# Patient Record
Sex: Male | Born: 1940
Health system: Southern US, Community
[De-identification: ages and names within clinical notes are randomized; demographics above are authoritative.]

## PROBLEM LIST (undated history)

## (undated) DIAGNOSIS — I2699 Other pulmonary embolism without acute cor pulmonale: Secondary | ICD-10-CM

## (undated) DIAGNOSIS — N289 Disorder of kidney and ureter, unspecified: Secondary | ICD-10-CM

## (undated) DIAGNOSIS — K219 Gastro-esophageal reflux disease without esophagitis: Secondary | ICD-10-CM

## (undated) DIAGNOSIS — I10 Essential (primary) hypertension: Secondary | ICD-10-CM

## (undated) DIAGNOSIS — Z8719 Personal history of other diseases of the digestive system: Secondary | ICD-10-CM

## (undated) DIAGNOSIS — I2510 Atherosclerotic heart disease of native coronary artery without angina pectoris: Secondary | ICD-10-CM

## (undated) DIAGNOSIS — G4730 Sleep apnea, unspecified: Secondary | ICD-10-CM

## (undated) DIAGNOSIS — M109 Gout, unspecified: Secondary | ICD-10-CM

## (undated) HISTORY — PX: BACK SURGERY: SHX140

---

## 1998-04-17 ENCOUNTER — Ambulatory Visit: Admission: RE | Admit: 1998-04-17 | Discharge: 1998-04-17 | Payer: Self-pay | Admitting: Otolaryngology

## 2001-04-13 ENCOUNTER — Ambulatory Visit (HOSPITAL_BASED_OUTPATIENT_CLINIC_OR_DEPARTMENT_OTHER): Admission: RE | Admit: 2001-04-13 | Discharge: 2001-04-13 | Payer: Self-pay | Admitting: Internal Medicine

## 2002-07-14 HISTORY — PX: OTHER SURGICAL HISTORY: SHX169

## 2003-07-15 HISTORY — PX: OTHER SURGICAL HISTORY: SHX169

## 2003-10-17 ENCOUNTER — Emergency Department (HOSPITAL_COMMUNITY): Admission: EM | Admit: 2003-10-17 | Discharge: 2003-10-18 | Payer: Self-pay | Admitting: Emergency Medicine

## 2003-10-30 ENCOUNTER — Ambulatory Visit (HOSPITAL_COMMUNITY): Admission: RE | Admit: 2003-10-30 | Discharge: 2003-10-30 | Payer: Self-pay | Admitting: Internal Medicine

## 2003-10-30 ENCOUNTER — Inpatient Hospital Stay (HOSPITAL_COMMUNITY): Admission: EM | Admit: 2003-10-30 | Discharge: 2003-11-03 | Payer: Self-pay | Admitting: Emergency Medicine

## 2004-05-05 ENCOUNTER — Inpatient Hospital Stay (HOSPITAL_COMMUNITY): Admission: AD | Admit: 2004-05-05 | Discharge: 2004-05-07 | Payer: Self-pay | Admitting: Internal Medicine

## 2005-07-14 HISTORY — PX: JOINT REPLACEMENT: SHX530

## 2005-08-22 ENCOUNTER — Encounter: Admission: RE | Admit: 2005-08-22 | Discharge: 2005-08-22 | Payer: Self-pay | Admitting: Internal Medicine

## 2005-12-22 ENCOUNTER — Inpatient Hospital Stay (HOSPITAL_COMMUNITY): Admission: RE | Admit: 2005-12-22 | Discharge: 2005-12-26 | Payer: Self-pay | Admitting: Orthopedic Surgery

## 2006-11-28 ENCOUNTER — Emergency Department (HOSPITAL_COMMUNITY): Admission: EM | Admit: 2006-11-28 | Discharge: 2006-11-28 | Payer: Self-pay | Admitting: Emergency Medicine

## 2007-07-15 HISTORY — PX: OTHER SURGICAL HISTORY: SHX169

## 2007-12-29 ENCOUNTER — Inpatient Hospital Stay (HOSPITAL_COMMUNITY): Admission: RE | Admit: 2007-12-29 | Discharge: 2008-01-02 | Payer: Self-pay | Admitting: Orthopedic Surgery

## 2008-01-04 ENCOUNTER — Inpatient Hospital Stay (HOSPITAL_COMMUNITY): Admission: EM | Admit: 2008-01-04 | Discharge: 2008-01-07 | Payer: Self-pay | Admitting: Emergency Medicine

## 2009-09-25 ENCOUNTER — Encounter: Admission: RE | Admit: 2009-09-25 | Discharge: 2009-09-25 | Payer: Self-pay | Admitting: Internal Medicine

## 2010-11-26 NOTE — H&P (Signed)
NAME:  Ralph Blackwell, Ralph Blackwell NO.:  [id number]   MEDICAL RECORD NO.:  [id number]          PATIENT TYPE:  INP   LOCATION:  3737                         FACILITY:  [facility]   PHYSICIAN:  Ralph Blackwell, MDDATE OF BIRTH:  December 15, 1940   DATE OF ADMISSION:  01/04/2008  DATE OF DISCHARGE:                              HISTORY & PHYSICAL   CHIEF COMPLAINT:  Unable to void.   HISTORY OF PRESENT ILLNESS:  The patient is a 70 year old gentleman with  history of hypertension, diabetes and PE.  Had left knee replacement.  Was sent home on Coumadin and Percocet and then noticed that he had  decreased urine output and had actually voided for the past 24-hours or  so.  When he came into the ER, he had a very distended bladder after  replacement of catheter had 1000 cc of urine.  Of note, his creatinine  was noted to be elevated to 10.  The patient was symptomatic other than  abdominal distention and pain.  No nausea or vomiting.  He also does not  endorse any chest pain or shortness of breath.  No lower extremity  edema.  His knee pain is under control.  He was constipated, but this  also has resolved.  He is overall feeling very well and appears to be  nontoxic.   REVIEW OF SYSTEMS:  As per above.   PAST MEDICAL HISTORY:  1. Significant for DVT.  Was on chronic Coumadin and was restarted on      Coumadin postoperatively.  2. Status post left knee replacement on December 29, 2007.  3. History of hypertension.  4. History of hyperlipidemia.  5. BPH.   ALLERGIES:  No known drug allergies.   MEDICATIONS:  1. Coumadin 5 mg p.o. daily.  2. Crestor 40 mg p.o. daily.  3. Omeprazole 40 mg p.o. daily.  4. Percocet as needed for pain.  5. Ramipril 10 mg p.o. daily.   SOCIAL HISTORY:  The patient does not smoke, use drugs or drink.  Lives  with his wife.   FAMILY HISTORY:  Noncontributory.   PHYSICAL EXAMINATION:  VITAL SIGNS:  Temperature 97.0, blood pressure  145/71, pulse 76,  respirations 20, saturating 97% on room air.  GENERAL:  The patient appears to be in no acute distress.  HEENT:  Head nontraumatic.  Mucous membranes moist.  LUNGS:  Clear to auscultation bilaterally.  HEART:  Regular rate and rhythm.  No murmurs, rubs or gallops.  Appears  to be somewhat hyperdynamic, though.  ABDOMEN:  Some suprapubic tenderness noted, but no distention.  NEUROLOGICAL:  Intact.  EXTREMITIES:  Lower extremities without edema.  There is a clean and dry  dressing on left knee.   LABORATORY DATA:  Hemoglobin 8.5 down from 9.3 at the time of discharge.  Sodium 124, potassium 4.7, creatinine 10.6, BUN 94.  UA shows no  evidence of infection.   ASSESSMENT/PLAN:  This is a 70 year old gentleman who had acute renal  failure likely secondary to urinary obstruction secondary to BPH.  1. Acute renal failure with Foley, start Flomax.  The patient will  likely need to follow up with Urology.  Would recommend Urology      consult in the a.m. so that the patient could get acquainted with      Urologist while here versus referring him as an outpatient.  Will      check FENA and renal ultrasound.  2. Hyponatremia.  Will check orthostatics to determine fluid status      and check urine osmolality.  Will try rehydration with IV fluids      and watch sodium.  3. History of hypertension.  Hold Ramipril given renal failure.  4. Hyperlipidemia.  Continue Crestor.  5. Prophylaxis.  The patient is on Coumadin and Protonix.  6. History of DVT/PE.  Will continue Coumadin.  Currently therapeutic.   Dr. Johnella Blackwell will assume care in the morning.      Ralph Cowboy, MD  Electronically Signed     AVD/MEDQ  D:  01/04/2008  T:  01/04/2008  Job:  [id number]   cc:   Ralph Blackwell, M.D.

## 2010-11-26 NOTE — Op Note (Signed)
NAME:  Ralph Blackwell, Ralph Blackwell NO.:  [id number]   MEDICAL RECORD NO.:  [id number]          PATIENT TYPE:  INP   LOCATION:  5022                         FACILITY:  [facility]   PHYSICIAN:  Harvie Junior, M.D.   DATE OF BIRTH:  1940-10-19   DATE OF PROCEDURE:  DATE OF DISCHARGE:                               OPERATIVE REPORT   PREOPERATIVE DIAGNOSIS:  End-stage degenerative joint disease, left  knee.   POSTOPERATIVE DIAGNOSIS:  End-stage degenerative joint disease, left  knee.   PROCEDURE:  Left total knee replacement with a sigma system, size 4  femur, size 4 tibia, 10-mm bridging bearing a 38-mm all-poly patella.   SURGEON:  Harvie Junior, M.D.   ASSISTANT:  Marshia Ly, P.A.   ANESTHESIA:  General.   BRIEF HISTORY:  Mr, Ralph Blackwell is a 70 year old male with a long history of  having had significant bilateral knee pain.  He had ultimately undergone  a right total knee replacement, and had done well.  With this and  ultimately was complaining of left knee pain and x-rays showed end-stage  degenerative joint disease.  He was brought to the operating room for  left total knee replacement.   PROCEDURE:  The patient brought to the operating room.  After adequate  anesthesia was obtained with general anesthetic, patient placed supine  on the operating table.  Left leg was prepped and draped in usual  sterile fashion.  Following this, the leg was exsanguinated.  Blood  pressure tourniquet was inflated to 350 mmHg.  Following this, midline  incision was made and a subcutaneous tissue was taken down at the level  of extensor mechanism and medial parapatellar arthrotomy was undertaken.  Attention was then turned to the knee where anterior and posterior  cruciates were excised, as well as medial and lateral meniscus.  At this  time, attention was turned towards computer alignment system and the  pins were placed in the femur, two pins in the tibia, two pins in the  femur, and  once this was completed, the arrays were placed and then the  registration process undertaken.  This adds about 20 minutes to surgical  procedure.  Once this was completed and because of his young age and  need for longevity implant, the knee was put through a range of motion.  We could get good neutral long alignment and at this point the tibia was  cut perpendicular to the long axis under computer guidance.  Once this  was completed, the femur was cut perpendicular to the  anatomic axis  under computer guidance.  Once this was completed, our attention was  turned towards the placement of the spacer blocks and that we get out  into perfect neutral long alignment perfect gap balance.  At this point,  attention was turned towards sizing the femur, was sized to a size 4,  and the anterior and posterior cuts were made, we did use a block that  allowed [place] to come down 2-mm, this was accomplished, which gave good gap  balance.  Once this was completed, the attention was then  turned towards  the chamfer cuts, which were made and following this the box cut was  made.  Attention was turned to the tibia, which was sized to a 4, pegs  were placed, and then the central portion was drilled and then a keel  was placed.  Once this was completed, the trial reduction was undertaken  with a 10 spacer, gave [place] perfect neutral long alignment with perfect  gap balance and extension and flexion was then a millimeter gap balance.  Once this completed, attention was turned to the patella where the  patella was cut down to a level from 26-mm down to 15-mm and the legs  were drilled for the patellar button.  Trial patellar button was put in  place.  Range of motion was undertaken.  Excellent easy full extension  and flexion.  At this point all trial components were removed.  The knee  was copiously and thoroughly lavaged and suctioned dry.  The final  components were then cemented in place size 4 femur, size 4  tibia, 10-mm  bridging bearing trial was placed and the 38-mm patella was cemented in  place.  When all cement was allowed to harden, the tourniquet was let  down.  Bleeding was controlled with electrocautery and once this was  completed, the attention was then turned towards the placement of the  final poly.  Once the final poly was placed, then the medium Hemovac  drain.  Medial parapatellar arthrotomy was then closed with 1-0 Vicryl  running, skin with 0 and 2-0 Vicryl, and then the skin with 3-0 Monocryl  subcuticular stitch.  Benzoin and Steri-Strips were applied.  Sterile  compressive dressing was applied.  The patient taken to recovery, is  noted to be in satisfactory condition.  Estimated blood loss for this  procedure was less than 150 mL.      Harvie Junior, M.D.  Electronically Signed     JLG/MEDQ  D:  12/29/2007  T:  12/30/2007  Job:  [id number]

## 2010-11-29 NOTE — Discharge Summary (Signed)
NAME:  VANDEN, FAWAZ NO.:  [id number]   MEDICAL RECORD NO.:  [id number]          PATIENT TYPE:  INP   LOCATION:  3737                         FACILITY:  [facility]   PHYSICIAN:  Candyce Churn, M.D.DATE OF BIRTH:  1940/07/28   DATE OF ADMISSION:  01/03/2008  DATE OF DISCHARGE:  01/07/2008                               DISCHARGE SUMMARY   DISCHARGE DIAGNOSES:  1. Acute renal failure secondary to urinary obstruction, resolved.  2. Postoperative anemia secondary to thigh hematoma, improved status      post transfusion.  3. History of deep vein thrombosis.  4. Status post left total knee replacement on December 29, 2007.  5. History of hypertension.  6. History of hyperlipidemia.  7. Benign prostatic hypertrophy.   DISCHARGE MEDICATIONS:  1. Crestor 20 mg daily.  2. Coumadin 5 mg daily.  3. Omeprazole 40 mg daily.  4. Ramipril 10 mg daily, but hold for now until further instructed as      an outpatient.  5. Avodart 0.5 mg orally daily.  6. Colace 100 mg p.o. b.i.d., but hold for diarrhea.  7. B complex tablet 1 daily.  8. Flomax 0.4 mg p.o. b.i.d.  9. Iron sulfate 325 mg daily.  10.Percocet 5/325 one to two orally q.6-8 h. p.r.n. pain if Tylenol      650 mg p.o. q.6 h. is not effective for knee pain secondary to      recent left total knee replacement.   DISCHARGE LABORATORIES:  From January 07, 2008, revealed a hemoglobin of  10.0, up from 7.9 on January 06, 2008.  White cell count 7800, and platelet  count 203,000.  Occult blood in stool was negative on January 06, 2008.  Pro time on January 07, 2008, was 21.5 seconds, INR was 1.8 which had been  2.0, 2.5, 3.0 on the preceding days.  On admission, sodium was 124, but  discharge sodium was 142 with potassium 4.5, chloride 110, bicarb 28,  and glucose 109.  BUN on admission was 94 with a creatinine of 6.6.  This rapidly corrected and on the day of discharge on January 07, 2008, it  was 1.14.  Liver functions performed on  January 05, 2008, revealed a total  protein of 5.62, albumin 2.5, AST 38, ALT 21, alkaline phosphatase 58,  and total bili 0.9.  TSH on January 05, 2008, was 0.868 and iron studies on  January 05, 2008, were low with an iron of 15, total iron-binding capacity  in the mid range at 238, and percent saturation of 6%.  B12 was also low  at 151, but methylmalonic acid levels were normal at 127 with normal  being 87-318.  Folate was normal at 10.7, ferritin normal at 248.  A  urinalysis on January 03, 2008, was within normal limits except for trace  of leukocyte esterase and 0-2 white cells and rare bacteria and mucus  was present.   HOSPITAL COURSE:  Mr. Shavar Gorka is a very pleasant 70 year old  gentleman who had been discharged home several days prior after having  had a left total knee replacement.  On going home, he had not been able  to void and had probably not voided significantly for more than 48  hours.  He experienced abdominal pain and distention and reported to the  [facility] Emergency Room on January 04, 2008, and Foley was placed and a  1000 mL of urine was evacuated.  He also was complaining of swelling of  the left thigh and an ecchymosis.   The patient was found to have a creatinine in the 9-10 range and a BUN  of almost 100 and was rapidly corrected with placement of Foley.  Creatinine was normal within 48 hours.   While hospitalized, his hemoglobin was found to be low at 7.9 felt to be  secondary to the left thigh hematoma being postoperative, and he was  transfused 2 units of packed red cells with hemoglobin at discharge much  improved as above.  It was 10.4.   An attempt to remove the Foley on the day of discharge was made, but the  patient could not void for approximately 6 hours and the Foley was  replaced and he was discharged home with Foley in place to follow with  Urology as an outpatient.   CONDITION ON DISCHARGE:  Much improved and with urinary function  returning to  normal.   Urinary retention was felt to be secondary to BPH and possibly  contributed by postoperative medications for his knee replacement.      Candyce Churn, M.D.  Electronically Signed     RNG/MEDQ  D:  02/13/2008  T:  02/13/2008  Job:  [id number]   cc:   Harvie Junior, M.D.

## 2010-11-29 NOTE — Op Note (Signed)
NAME:  Ralph Blackwell, Ralph Blackwell               ACCOUNT NO.:  [id number]   MEDICAL RECORD NO.:  [id number]          PATIENT TYPE:  INP   LOCATION:  2550                         FACILITY:  [facility]   PHYSICIAN:  Harvie Junior, M.D.   DATE OF BIRTH:  [birth year]   DATE OF PROCEDURE:  12/22/2005  DATE OF DISCHARGE:                                 OPERATIVE REPORT   PREOPERATIVE DIAGNOSIS:  End-stage degenerative joint disease, right knee.   POSTOPERATIVE DIAGNOSIS:  End-stage degenerative joint disease, right knee.   PRINCIPLE PROCEDURE:  1.  Right total knee replacement with [employer] Sigma system, a size      3 femur, a size 4 tibia, a 12.5 mm bridging bearing, and a 35 mm all      polyethylene patella, all cemented.  2.  Computer assisted right total knee replacement.   SURGEON:  Harvie Junior, M.D.   ASSISTANT:  Marshia Ly, P.A., present for the entire case.   BRIEF HISTORY:  Ralph Blackwell is a 70 year old male with a long history of  having had significant pain in the right knee.  Was treated conservatively  with cortisone injection and anti-inflammatory medications, a series of  Supartz.  All this failed because of continuing complaints of pain.  Knee  arthroscopy had been performed and showed that he had a large area of  exposed bone with a kissing lesion on the tibia.  We talked about treatment  options and tried to nurse him through but because of continued complaints  of pain, ultimately felt that total knee replacement was appropriate, and he  was brought to the operating room for this procedure.   PROCEDURE:  Patient was brought to the operating room.  After adequate  general anesthesia was obtained with general anesthetic, the patient was  positioned on the operating room table.  The right leg was then prepped and  draped in the usual sterile fashion.  Following this, the leg was  exsanguinated, and blood pressure tourniquet was inflated to 350 mmHg.  Following this, a  midline incision was made.  The subcutaneous tissue was  dissected down to the level of the extensor mechanism, and a medial  parapatellar arthrotomy was undertaken.  At that point, the anterior and  posterior cruciates were excised as well as the medial and lateral meniscus,  and the leg was exposed.  At this point, computer assistance was used to cut  the tibia perpendicular to its long axis.  We ultimately elected to go 4 mm  below the low deficit, and prior to cutting, the computer modules were  applied.  Two pins were applied to the tibia, two pins applied to the femur,  and the corresponding rays were then placed, and then registration was  undertaken, a 15 mm registration process to allow for accurate cutting with  the computer.  Once that was done, the tibia was cut perpendicular to its  long axis per the computer plan of 4 mm lower than the low side.   Attention was then turned to the femoral side.  The distal femur was cut  perpendicular to its long axis.  I used a spacer block to check with the  gaps.  A 12. 5 mm bearing seemed best at this point.  Attention was then  turned towards sizing the femur and preoperative planning.  The femur was  between a 3 and a 4.  To get the gaps balanced perfectly, a size 3 femur  seemed to work better, so that was chosen.  At this point, the cuts were  made, anterior and posterior chamfers, and then the blocks again were used.  Perfect balancing was achieved at this point, as per the computer plan.  The  box was then cut, and the trial femur was put in place.  The tibia was then  sized to a 4.  The trial tibia was cut.  The keel was punched at this point,  and then a trial with a 12.5 was put in place.  The computer then showed 5  degrees of hyperextension but perfect gap balancing at 0 degrees as well as  perfect gap balancing at 90 degrees.  The patella was perfectly midline  tracked at this point, and then the patella was cut down to a level  of 14  mm, and the 35 patella was chosen and put in place.  Once this was  accomplished, the computer was then used to make sure that the alignment was  in fact perfect.  It was, and the stability felt great with a 12.5 mm  insert.  At this time, the trials were removed, and the final components  were cemented into place after the knee was copiously irrigated and  suctioned dry with the pulsatile lavage irrigation system.  Once that was  accomplished, a 12.5 bearing was put in place, and again, the gaps were  checked.  The gaps were perfect.  Did kind of come into 6 degrees of  hyperextension, according to the computer.  It looked relatively straight to  [place] on the table.  After the cement hardened, we did trial a 15 insert just  because of the little bit of sag that he had.  The 15 insert would not allow  the implant to come all the way to full extension.  At that point, we felt  that the 12.5 was going to be a better polyethylene insert, and we went with  that original plan.  A 12.5 was then put in place.  The knee was put through  a range of motion, excellent stability.  Again was irrigated, and the  tourniquet was let down.  Bleeding was controlled with electrocautery.  Then  the medial parapatellar arthrotomy was closed with a #1 running suture.  This came up from below as well.  This was tied.  Excellent range of motion  was then achieved.  The knee was then closed with 0 and 2-0 Vicryl, and a 3-  0 Maxon pull-out suture.  Benzoin and Steri-Strips were applied.  A sterile  compressive dressing was applied, and the patient was taken to the recovery  room and noted to be in satisfactory condition.  Estimated blood loss for  the procedure was less than 100 cc.   The patient did have an issue of previous history of pulmonary embolus, and  he had a deep venous thrombosis after his knee arthroscopy.  Because of that, the patient was on Coumadin, and our plan was to keep the patient on   Coumadin and come off three days ahead of time and do the  surgery regardless  of the pro time.  This plan was followed.  We put PAS stockings on the  patient on the nonoperative leg during the surgery and postoperatively, we  put a thigh-high TED stocking and a PAS stockings on the patient.  He will  be restarted on Coumadin the night of surgery and will also start him on  Lovenox approximately eight hours after the surgery to allow for him to have  the best possible change at a prophylaxis of DVT.      Harvie Junior, M.D.  Electronically Signed     JLG/MEDQ  D:  12/22/2005  T:  12/22/2005  Job:  [id number]   cc:   [facility]

## 2010-11-29 NOTE — H&P (Signed)
NAME:  BRAYDEN, BRODHEAD NO.:  [id number]   MEDICAL RECORD NO.:  [id number]                   PATIENT TYPE:  OUT   LOCATION:  CATS                                 FACILITY:  [facility]   PHYSICIAN:  Candyce Churn, M.D.          DATE OF BIRTH:  Jan 14, 1941   DATE OF ADMISSION:  10/30/2003  DATE OF DISCHARGE:                                HISTORY & PHYSICAL   CHIEF COMPLAINT:  Left chest pain.   Mr. Coate is a pleasant 70 year old male with a recent history of left wrist  surgery secondary to a fall and fracture of his left ulna and radius.  Surgery was approximately two weeks ago.  Over the past four or five days,  he has developed left posterior pleuritic chest pain which was quite severe  when he would lie flat.  Chest x-ray today in my office revealed a left  effusion and left atelectasis.  Subsequent CT scan with rule out PE protocol  revealed multiple bilateral pulmonary emboli.  He is admitted now for IV  heparin therapy.  He has had dyspnea on exertion.   PAST MEDICAL HISTORY:  1. Gastroesophageal reflux disease.  2. Erectile dysfunction.  3. Allergic rhinitis.  4. Recent increased stress at work.  5. Status post left ulna and radial fracture pinning approximately two weeks     ago.   MEDICATIONS:  1. Prilosec 20 mg daily on a p.r.n. basis for his symptoms of reflux.  2. Flonase 1 spray in each nostril h.s.   FAMILY HISTORY:  Significant for CABG in his father at age 38.  Father has  mild diabetes.  Father also has GERD.  Mother died in 09-Dec-1999 after succumbing  to stomach cancer.   SOCIAL HISTORY:  The patient does not smoke or drink.  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  He has three children, ages 57-  41.   REVIEW OF SYSTEMS:  The patient denies any exertional chest pain, but he  does have exertional dyspnea and no family history of clotting disorder that  he is aware of.  He has never had a prior DVT.   He does have ecchymosis over  his right calf and left forearm from his fall that he suffered two weeks  ago.  He has nocturia x1.  Again mild erectile dysfunction.   He does have chronic mild tinnitus and he has mild decreased hearing in the  high frequency range.   PHYSICAL EXAMINATION:  GENERAL:  Well-developed, well-nourished male in no  acute distress.  VITAL SIGNS:  Height is 5 feet 8 inches, weight 200 pounds, temperature  97.1, respiratory rate is 18 and nonlabored.  He does have pain in his  posterior left chest with inspiration.  HEENT:  Atraumatic, normocephalic.  Wears glasses.  Oropharynx clear and  moist.  NECK:  Supple without JVD or thyromegaly.  CHEST:  Clear  to auscultation except in his left base where he has decreased  breath sounds.  Few scattered rales in that area.  He does have a fairly  intense left pleuritic chest pain on inspiration posteriorly.  CARDIAC:  Regular rhythm without murmur or gallop.  ABDOMEN:  Soft, nontender.  Bowel sounds are normal.  RECTAL:  Not performed.  GU:  Circumcised male.  Normal exam.  EXTREMITIES:  Ecchymosis over his left calf which has been there since his  fall two weeks ago.  He had ecchymosis over his medial and distal arm and  proximal left forearm.  Otherwise no peripheral edema.  Negative Homan's  bilaterally in lower extremities.  NEUROLOGICAL:  Oriented x3.  Nonfocal exam.  SKIN:  Without rashes except the two ecchymoses.   Chest x-ray for an attempt on medical clearance revealed left pleural  effusion and left lower lobe atelectasis.   CT scan in a cone of his chest with contrast reveals multiple bilateral  pulmonary emboli.   ASSESSMENT:  This is a 70 year old male with multiple bilateral pulmonary  emboli.  No previous history of DVT or family history of hypercoagulable  state.  He did have a previous surgery two weeks ago, but has been  relatively active except for 24 hours around the time of the surgery.   Denies any symptoms that suggest lower extremity DVT.   PLAN:  1. Start IV heparin and p.o. Coumadin.  2. Monitor.  3. Check a factor V Leiden.  Consider formal coag workup.  4. For chronic stress with his job and also domestic concerns with having to     take care of his father, we will start Celexa 20 mg daily and will use IV     Toradol q. 6 hours for pain.  We will hydrate with normal saline     overnight at 125 an hour and check B-Met in the A.M.                                                Candyce Churn, M.D.    RNG/MEDQ  D:  10/30/2003  T:  10/31/2003  Job:  [id number]

## 2010-11-29 NOTE — H&P (Signed)
NAME:  Ralph Blackwell, Ralph Blackwell NO.:  [id number]   MEDICAL RECORD NO.:  [id number]          PATIENT TYPE:  INP   LOCATION:  3734                         FACILITY:  [facility]   PHYSICIAN:  Lorain Childes, M.D. LHCDATE OF BIRTH:  August 01, 1940   DATE OF ADMISSION:  05/05/2004  DATE OF DISCHARGE:                                HISTORY & PHYSICAL   PHYSICIANS:  Primary care physician:  Dr. Kevan Blackwell.  Referring physician:  Dr. Sherril Blackwell, [facility].   CHIEF COMPLAINT:  Chest pain.   HISTORY OF PRESENT ILLNESS:  The patient is a 70 year old gentleman with no  known coronary disease but risk factors of male sex, age, and  hyperlipidemia.  Admitted to [facility] with chest pain this morning.  The  patient was at church and developed substernal chest pain radiating to his  left arm that was rated at 6/10 and associated with shortness of breath  which was transient, also diaphoresis.  No nausea, vomiting, or syncope.  The symptoms began around 11 a.m.  He took a friend's sublingual  nitroglycerin which resolved his pain completely after 30 minutes.  His wife  then took him to the emergency room for further evaluation.  At [facility], troponin was negative, but CK-MB was elevated.  He was transferred  here for cardiac catheterization.  The patient denies any current chest  pain.  He denies any lower extremity edema, no PND or orthopnea, no current  shortness of breath, no palpitations, no syncope.   PAST MEDICAL HISTORY:  1.  Two back surgeries, last in 1999.  2.  Status post fall resulting in a left wrist fracture and leg trauma.  3.  Status post left ulna and radial fracture pinning in March 2005.  4.  He was admitted in April 2005 with pulmonary embolus in the setting of      his recent leg trauma.  He has been on Coumadin since then.  5.  Gastroesophageal reflux disease.  6.  History of erectile dysfunction.  7.  History of allergic rhinitis.  8.  Borderline  hyperlipidemia followed by his primary care physician.   MEDICATIONS:  1.  Coumadin 7.5 mg Monday, Wednesday, and Friday alternating with 5 mg      q.h.s.  This was changed recently as INR was subtherapeutic.  2.  Protonix 20 mg p.o. each evening.   ALLERGIES:  No known drug allergies.   SOCIAL HISTORY:  He lives in [place], [place], with his wife.  He  has three children who are grown.  He is owner of [employer].  He has never smoked tobacco, no alcohol, no other drugs, no  over medication use.  He tries to follow a low-fat diet.  He does regular  exercise; he walks 2.5 miles a day except on Sunday.  He does this without  any anginal complaints.   FAMILY HISTORY:  His mother died at the age of 62 with stomach cancer.  His  father is alive at the age of 43.  He had a CABG at the age of 41.  He  has  diabetes, hypertension, and he has had three strokes.  His son is his care  giver.  Siblings:  He has one brother who has diabetes, Parkinson's disease,  and hypertension.   REVIEW OF SYSTEMS:  He denies any fevers or chills.  No sweats, no weight  changes, no headaches or visual changes.  He does have tinnitus which is  chronic.  No skin rashes or lesions.  CARDIOPULMONARY:  Per HPI.  Denies any  dyspnea on exertion, orthopnea, PND, lower extremity edema, palpitations,  claudication, cough or wheeze.  GU:  He denies any urinary symptoms.  No  hematuria.  NEUROLOGIC/PSYCH:  He denies any weakness, numbness,  disturbances.  GI:  No nausea, vomiting, diarrhea, bright red blood per  rectum, melena, hematemesis.  He does have a bruit controlled with Protonix.  No change in his bowel habits.  ENDOCRINE:  No polyuria or polydipsia.  All  other symptoms are negative.   PHYSICAL EXAMINATION:  VITAL SIGNS:  Temperature 98.2, pulse 64,  respirations 14, blood pressure 91/68, saturation 98% on room air.  Weight  199.4 pounds.  GENERAL:  Very pleasant male, lying  comfortably, in no acute distress.  HEENT:  Normocephalic and atraumatic.  Pupils equal, round, and reactive to  light.  Oropharynx is clear.  NECK:  Supple.  Full range of motion.  He has no carotid bruits.  JVP is  approximately 6 cm.  He has 2+ carotid upstrokes.  No lymphadenopathy.  CARDIOVASCULAR:  Normal S1, split S2. Regular rate and rhythm.  No murmurs  appreciated.  PMI is nondisplaced.  Pulses 2+ and equal without bruit.  LUNGS:  Clear bilaterally.  No rales, no rhonchi.  No wheezing.  ABDOMEN:  Soft, positive bowel sounds, nontender.  No organomegaly.  EXTREMITIES:  No edema.  NEUROLOGIC:  Alert and oriented x 3.  Cranial nerves II-XII grossly intact.  Strength appropriate at 5/5.   LABORATORY AND X-RAY DATA:  EKG shows rate of 63, sinus rhythm.  Axis is  normal with normal intervals.  No ischemic changes.  Subsequent EKG from  today at [facility] shows a rate of 48 with no ischemic changes, sinus rhythm.  This is after receiving metoprolol   Laboratory data from [facility]:  Hematocrit 46, platelet count 152, white  count 5.8.  CK 356, MB 13.6, troponin 0.06.  Repeat CK 296, MB 10.7,  troponin 0.0.   IMPRESSION AND PLAN:  The patient is a 70 year old gentleman with cardiac  risk factors of age, male sex, and hyperlipidemia who was admitted with  chest pain, transferred for further cardiac evaluation.   1.  Coronary artery disease.  Concerning for angina.  He has elevated CK-MB.      Will admit him to telemetry, cycle cardiac enzymes, and follow his EKG.      Will plan for cardiac catheterization on Monday or Tuesday.  For medical      management, will continue aspirin, change Lopressor to 25 mg p.o. q.12h.      Continue his heparin drip.  Start him on ___________  and start Lipitor.      Will check fasting lipid panel, hemoglobin A1C for risk factor      stratification.  2.  Gastroesophageal reflux disease.  Well controlled with PPI.  Will     continue, start Protonix p.o.  40 mg daily.  3.  History of pulmonary embolus.  He denies any pleuritic chest pain or      recent shortness of breath.  Pulmonary      embolus appeared to be in the setting of leg trauma.  INR is      subtherapeutic.  He is on heparin currently which will cover him.  We      will hold Coumadin until we know results of cardiac catheterization.  If      he requires stent, we may continue to hold Coumadin.       CGF/MEDQ  D:  05/05/2004  T:  05/05/2004  Job:  [id number]   cc:   Dr. Kevan Blackwell   Dr. Sherril Blackwell

## 2010-11-29 NOTE — Discharge Summary (Signed)
NAME:  Ralph Blackwell, Ralph Blackwell NO.:  [id number]   MEDICAL RECORD NO.:  [id number]          PATIENT TYPE:  INP   LOCATION:  5022                         FACILITY:  [facility]   PHYSICIAN:  Harvie Junior, M.D.   DATE OF BIRTH:  1941-03-09   DATE OF ADMISSION:  12/29/2007  DATE OF DISCHARGE:  01/02/2008                               DISCHARGE SUMMARY   ADMITTING DIAGNOSES:  1. End-stage degenerative joint disease, left knee.  2. Hypertension.  3. Hyperlipidemia.  4. Gastroesophageal reflux disease.  5. History of pulmonary embolus, 2005.  6. History of deep venous thrombosis, right lower extremity, 2007.   DISCHARGE DIAGNOSES:  1. End-stage degenerative joint disease, left knee.  2. Hypertension.  3. Hyperlipidemia.  4. Gastroesophageal reflux disease.  5. History of pulmonary embolus, 2005.  6. History of deep venous thrombosis, right lower extremity, 2007.  7. Acute blood loss anemia.   PROCEDURES IN THE HOSPITAL:  Left total knee arthroplasty, computer-  assisted; Harvie Junior, MD, December 29, 2007.   CONSULTATIONS IN HOSPITAL:  None.   BRIEF HISTORY:  Mr. Hackler is a 70 year old male who is well known to [place]  who has a long history of pain in his left knee, it has not been  relieved with conservative treatment including injection therapy,  modification of activity, and medication.  Mr. Goodchild has pain at night  in the left knee and pain with ambulation.  His x-rays shows that he has  bone-on-bone degenerative arthritis of the medial compartment with  marked spurring of the patellofemoral joint.  Based upon his clinical  and radiographic findings, he was admitted for a left total knee  replacement.   LABORATORY STUDIES:  Hemoglobin on admission was 13.9, hematocrit was  41.4, indices within normal limits.   On postop day #1, his hemoglobin was 11.9; #2, 11.2; #3, 10.4; and #4,  9.3.  Protime on admission was 14.2 seconds with an INR of 1.1 with a  PTT of 29.  On the  date of discharge his protime was 28.8 seconds with  an INR of 2.6.  CMET on admission showed no abnormalities.  BMET on  postop day #1, also was unremarkable.  Urinalysis on admission showed no  abnormalities.   HOSPITAL COURSE:  Mr. Strength underwent left total knee replacement as  well as described in Dr. Luiz Blare operative note on December 29, 2007.  Preoperatively, he was given a gram of Ancef as well as 80 mg IV of  gentamicin.  He was given a gram of Ancef x24 hours.  Postoperatively,  he was started on Coumadin antithrombotic therapy per pharmacy protocol  along with subcu Lovenox starting about 18 hours postoperatively for DVT  prophylaxis, particularly in light of his previous blood clot history.  PCA Dilaudid was used for pain control.  Physical therapy was ordered  for walker ambulation, weightbearing as tolerated on the left.  CPM  machine was used for range of motion.   On postop day #1, the patient stated he is feeling pretty well.  He was  taking p.o. fluids.  Foley catheter placed  at the time of surgery was  removed.  His vital signs were stable.  His hemoglobin is 11.9.  He was  on oral iron as well.  He was continued on PCA Dilaudid and has gotten  out of bed to the chair with physical therapy.   On postop day #2, his left knee dressing was changed and his Hemovac  drain was pulled.  He complained of soreness in his knee.  He was taking  fluids and voiding without difficulty.  He spiked a fever up to 101.7,  was then found to be afebrile.  His vital signs were stable.  His  hemoglobin was 9.9.  His INR was 1.7.  His O2 sats were 94% on 2 L.  His  IV was converted to a saline lock and his PCA Dilaudid pump was  discontinued.  He was continued on Lovenox until he was therapeutic on  his Coumadin.   On postop day #3, he was overall doing fairly well.  His vital signs  were stable.  He had some constipation, which the patient was very  focused on.  He was voiding well, but  with some hesitancy in starting.  He was continuing with his physical therapy.  His hemoglobin was  rechecked and on the date of discharge it was 9.3.   On postop day #4, on January 02, 2008, the patient was overall doing well.  He was ready to go home.  He had some nausea.  His vitals signs were  stable.  He was voiding, but with some mild hesitancy, but he felt he  was okay with this.  His left knee wound was clean and dry and his calf  was nontender with negative Homan's.  The patient was discharged home in  improved condition, was on a regular diet.   He was given Rx Percocet 5 mg p.r.n. for pain, Phenergan 25 mg tablets  p.r.n. nausea, Coumadin for DVT prophylaxis x1 month postop, Robaxin 500  mg p.r.n. for spasm.   His activity status will be weightbearing as tolerated on the left with  a walker.  He will use a home CPM machine.  He will need home health  physical therapy and a home health RN for protimes and Coumadin  management.  He will followup with Dr. Luiz Blare in the office in 10 days.  He will call sooner if he has any problems.      Marshia Ly, P.A.      Harvie Junior, M.D.  Electronically Signed    JB/MEDQ  D:  01/26/2008  T:  01/27/2008  Job:  [id number]

## 2010-11-29 NOTE — Discharge Summary (Signed)
NAME:  NILS, THOR NO.:  [id number]   MEDICAL RECORD NO.:  [id number]          PATIENT TYPE:  INP   LOCATION:  3734                         FACILITY:  [facility]   PHYSICIAN:  Doylene Canning. Ladona Ridgel, M.D.  DATE OF BIRTH:  [birth year]   DATE OF ADMISSION:  05/05/2004  DATE OF DISCHARGE:  05/07/2004                                 DISCHARGE SUMMARY   PROCEDURE:  Cardiac catheterization October 24.   REASON FOR ADMISSION:  Please refer to dictated admission note.   LABORATORY DATA:  Cardiac enzymes with peak CPK 283/6.8 on admission, normal  troponin I markers (x 3).  Lipid profile with total cholesterol 232,  triglycerides 175, HDL 41, LDL 156 (cholesterol/HDL ratio was 7).  TSH 1.61.  Hemoglobin A1C 5.8.  Electrolytes, renal function normal.  Liver enzymes  normal.  CBC on admission normal.  INR 1.3 at discharge.   HOSPITAL COURSE:  Following transfer from [facility] where the  patient initially presented with chest pain in context of no prior coronary  artery disease but multiple cardiac risk factors, he was maintained on  intravenous heparin, aspirin, Metoprolol, and nitroglycerin, with plans to  proceed with diagnostic coronary angiography.  Of note, the patient was on  Coumadin prior to admission for treatment of recent pulmonary embolus.  INR  was 1.6 on admission.  Serial cardiac markers were negative for myocardial  infarction.  Cardiac catheterization performed by Dr. Shawnie Pons on  October 24 (see report for full details) revealed nonobstructive coronary  artery disease with normal left ventricular function.  Specifically, there  was 30-40% mid LAD, 20% mid circumflex, 50% proximal second obtuse marginal,  and normal right coronary artery.  Left ventricular function was normal with  no evidence of focal WMAs.  Dr. Riley Kill recommended a repeat CT scan to rule  out recurrent pulmonary embolus.  This was negative.  There was, however,  question of right  internal jugular vein clot, per Dr. Azucena Kuba of radiology.  However, it was also felt that this might represent artifact.  Nevertheless,  following consultation with Dr. Riley Kill and Dr. Johnella Moloney, the plan was  to continue treatment with Coumadin for three additional weeks.  The plan  was to discharge the patient on Lovenox over the ensuing few days.   DISCHARGE MEDICATIONS:  Coumadin 7.5 mg daily, Lovenox 90 mg q.12h., Vytorin  10/20 mg daily, coated aspirin 325 mg daily.   DISCHARGE INSTRUCTIONS:  No heavy lifting (greater than 25 pounds) x 3 days.  Low fat, low cholesterol diet.  Keep cath site clean and dry.  The patient  is instructed to have follow up protime with Dr. Kevan Ny on Friday following  discharge.  He will also need follow up fasting lipid/liver profile in  approximately six weeks.  The patient is instructed to arrange follow up  with Dr. Belva Crome in one week.   DISCHARGE DIAGNOSIS:  1.  Noncardiac chest pain.      1.  Nonobstructive coronary artery disease - cardiac catheterization          May 06, 2004.  2.  Normal left ventricular function.  2.  Status post pulmonary embolus April 2005.      1.  No recurrent pulmonary embolus by chest CT scan this admission.      2.  Question right internal jugular vein clot.  3.  Dyslipidemia.  4.  Gastroesophageal reflux disease.       GS/MEDQ  D:  06/27/2004  T:  06/27/2004  Job:  [id number]   cc:   Candyce Churn, M.D.  [address] [place]  [place] [address]  Fax: [phone]   Aundra Dubin. Revankar, M.D.  [address]  [place]  [place] [address]  Fax: [phone]

## 2010-11-29 NOTE — Cardiovascular Report (Signed)
NAME:  LINAS, STEPTER NO.:  [id number]   MEDICAL RECORD NO.:  [id number]          PATIENT TYPE:  INP   LOCATION:  3734                         FACILITY:  [facility]   PHYSICIAN:  Arturo Morton. Riley Kill, M.D. [facility] OF BIRTH:  1941/04/05   DATE OF PROCEDURE:  05/06/2004  DATE OF DISCHARGE:                              CARDIAC CATHETERIZATION   INDICATIONS FOR PROCEDURE:  The patient is a 70 year old who previously has  had trauma-related pulmonary emboli.  He presented with some recurrent chest  pain.  His INR was slightly low.  CK-MB was elevated, but troponin was  normal.  There were no acute electrocardiographic abnormalities.  The pain  was substernal this time and not similar to what he had previously.  Related  to this, decision was made to evaluate the coronary arteries.  INR done this  morning was 1.5.  We elected to use 5 French catheters for the procedure.   PROCEDURE:  1.  Left heart catheterization.  2.  Selective coronary arteriography.  3.  Selective left ventriculography.   DESCRIPTION OF PROCEDURE:  The patient was brought to the catheterization  laboratory and prepped and draped in the usual sterile fashion.  Through an  anterior puncture, the right femoral artery was easily entered on the first  stick.  A 5 French sheath was placed.  Central aortic and left ventricular  pressures were measured with a pigtail.  Ventriculography was performed in  the RAO projection.  Multiple views of the left coronary artery were then  obtained in multiple angiographic projections.  RCA arteriography was then  performed without complications.  Following review of the films, I elected  to pull the sheath.  He was held on the table for approximately 25 minutes.  I personally held the groin for approximately 10 minutes with careful  attention to femoral artery management.  Good hemostasis was achieved and he  was taken to the holding area in satisfactory clinical  condition.   I then reviewed the films with the patient's wife and daughter.   HEMODYNAMIC DATA:  1.  Central aortic pressure 123/74, mean 96.  2.  Left ventricular pressure 116/13.  3.  No gradient on pullback across the aortic valve.   ANGIOGRAPHIC DATA:  1.  Ventriculography was performed in the RAO projection.  Overall systolic      function appeared to be well preserved.  There did not appear to be a      significant wall motion abnormality and ejection fraction was in excess      of 55%.  2.  The left main coronary artery was a large caliber vessel being what      appeared to be at least 5 to 7 mm and free of critical disease.  3.  The left anterior descending artery courses to the apex.  There is mild      luminal irregularity of the LAD.  In the midportion of the LAD there is      about 30 to 40% area of segmental plaquing in the midvessel.  There is a  first diagonal branch that also has luminal irregularity at the first      bend and flexion point which was about 40 to no more than 50%.  The      distal LAD wraps the apex where then it bifurcates.  There does not      appear to be critical narrowing.  4.  The circumflex is a dominant vessel.  There is a first marginal branch      that has minimal proximal irregularity, but no significant high grade      areas of focal stenosis.  There is then a second marginal branch which      on the very first image suggests that there may be some mild ostial      stenosis, but most views it does not appear to be more than 50%. The      distal circumflex consists of a posterior descending and large      posterolateral system, all of which appear to be free of significant      disease.  There is at most 20% minimal irregularity in the midportion of      the AV circumflex leading into the PDA system and it is a very large      caliber vessel.  5.  The right coronary artery is a nondominant vessel that appears to be      free of  significant disease.   CONCLUSION:  1.  Well preserved left ventricular function.  2.  Mild coronary abnormalities as noted in the above text without critical      focal stenosis.   DISPOSITION:  I have discussed the case with Dr. Kevan Ny.  We will get a  repeat CAT scan to evaluate for recurrent pulmonary emboli because the INR  was low.  I will restart his heparin in a few hours.  He is due to come off  of his Coumadin in the near future, so I think the CT will be helpful in  evaluating whether this can be safely achieved.       TDS/MEDQ  D:  05/06/2004  T:  05/06/2004  Job:  [id number]   cc:   Candyce Churn, M.D.  [address] [place]  [place] [address]  Fax: [phone]   Florian Buff, M.D.

## 2010-11-29 NOTE — Discharge Summary (Signed)
NAME:  KLEBER, CREAN NO.:  [id number]   MEDICAL RECORD NO.:  [id number]          PATIENT TYPE:  INP   LOCATION:  5012                         FACILITY:  [facility]   PHYSICIAN:  Harvie Junior, M.D.   DATE OF BIRTH:  24-Aug-1940   DATE OF ADMISSION:  DATE OF DISCHARGE:  12/26/2005                                 DISCHARGE SUMMARY   ADMISSION DIAGNOSES:  1. End-stage degenerative joint disease right knee.  2. Hypertension.  3. History of pulmonary embolus in 2005.  4. History of right deep venous thrombosis, February, 2007.   DISCHARGE DIAGNOSES:  1. End-stage degenerative joint disease right knee.  2. Hypertension.  3. History of pulmonary embolus in 2005.  4. History of right deep venous thrombosis in February, 2007.  5. Postoperative diarrhea.   PROCEDURES IN HOSPITAL:  Right total knee arthroplasty, computer assisted,  Jodi Geralds M.D., December 22, 2005.   BRIEF HISTORY:  Mr. Jefferys is a pleasant 70 year old male with a long history  of right knee pain.  Plain x-rays of the right knee have shown bone on bone  arthritis.  He complains of pain with ambulation and pain at night.  He has  had a cortisone injection several times in the right knee, which has given  him only temporary relief of his symptoms.  Based upon his clinical and  radiographic findings, he was felt to be a candidate for a right total knee  arthroplasty and he was admitted for this.   PERTINENT LABORATORY STUDIES:  EKG on admission showed normal sinus rhythm.  Normal EKG.  Hemoglobin on admission was 14.3, hematocrit was 43.0.  Indices  were within normal limits.  On postop day #1 his hemoglobin was 12.5, #2 it  was 11.6, #3 it was 10.3.  INR on December 18, 2005 was 2.8.  INR on December 22, 2005 preoperatively was 1.2.  Postoperatively he was put on Coumadin  antithrombocytic therapy and his INR on the day of discharge was 2.5.  C-met  on admission was entirely normal.  B-met on post-op day #1 was  normal.  Urinalysis showed no abnormalities.  Urine culture preoperatively showed no  growth.   HOSPITAL COURSE:  Mr. Furey underwent right total knee arthroplasty,  computer assisted as well described in Dr. Luiz Blare operative note on December 22, 2005.  Preoperatively he was given a g of Ancef and 8 mg IV of Gentamycin  prophylactically.  He underwent the surgical procedure without  complications.  He had his Coumadin stopped preoperatively for several days  to get ready for his surgery and he was resumed on this immediately  postoperatively.  He was started on IV fluids, a PCA Morphine pump, and  physical therapy was ordered as well as a CPM machine for his right knee  range of motion.  On postop day #1 he had some nausea and vomiting.  He was  taking fluids without difficulty.  Foley catheter that was in place at the  time of surgery was intact.  His hemoglobin was 12.5.  His INR was 1.2.  His  PCA Morphine pump was discontinued and switched to PCA Dilaudid.  He was  started on subcu Lovenox the night of his surgery for DVT prophylaxis until  his INR came up to a therapeutic level.  He was given some IV Zofran for his  nausea.  He was sensation by physical therapy and occupational therapy.  He  still had complaints of nausea on postop day #2.  His INR was up to 1.7.  His hemoglobin was stable at 11.3.  His Foley catheter was discontinued.  He  had his dressing changed and Hemovac pulled at this point.  On postop day #3  he had soreness in his knee and some diarrhea.  He was making progress with  his physical therapy.  He had a low-grade fever of 100.6.  His INR was 2.3.  He was continued with Coumadin and we watched his diarrhea, which seemed to  improve.  On postop day #4 he was without complaints.  He was taking fluids  and voiding without difficulty.  He was progressing with physical therapy.  He had spiked a fever up to 101.1, but since then had been afebrile.  His  vital signs were  stable.  His right knee dressing and wound were both clean  and dry.  His calf was soft and nontender with no sign of DVT.  His INR was  2.5.  He was discharged home in improved condition.  He is on a regular  diet.  He was given an Rx for Percocet 5 mg as needed for pain and Coumadin  per home dosage that he had been chronically.  He will be set up for home  health physical therapy 3 times a week and a home CPN for range of motion,  and home health RN for protimes.  He will ambulate weightbearing as  tolerated on the right with a walker.  He will follow up with Dr. Luiz Blare  office in 2 weeks.  Sooner if any problems occur.      Marshia Ly, P.A.      Harvie Junior, M.D.  Electronically Signed    JB/MEDQ  D:  02/05/2006  T:  02/05/2006  Job:  [id number]   cc:   Candyce Churn, M.D.

## 2010-11-29 NOTE — Discharge Summary (Signed)
NAME:  Ralph Blackwell, Ralph Blackwell NO.:  [id number]   MEDICAL RECORD NO.:  [id number]                   PATIENT TYPE:  INP   LOCATION:  2016                                 FACILITY:  [facility]   PHYSICIAN:  Candyce Churn, M.D.          DATE OF BIRTH:  10-02-1940   DATE OF ADMISSION:  10/30/2003  DATE OF DISCHARGE:  11/03/2003                                 DISCHARGE SUMMARY   DISCHARGE DIAGNOSES:  1. Multiple bilateral pulmonary emboli.  2. Recent fracture of the left ulna and radial with pinning approximately 2     weeks prior to admission.  3. Erectile dysfunction.  4. Allergic rhinitis.  5. Gastroesophageal reflux disease.   DISCHARGE MEDICATIONS:  1. Coumadin 7.5 mg daily.  2. Prilosec 20 mg daily on a p.r.n. basis for reflux.   PROCEDURE:  Contrast CT of the chest October 30, 2003, consistent with  multiple bilateral pulmonary emboli with left pleural effusion and possible  left lower lobe pulmonary infarct.   HOSPITAL COURSE:  [name] is a very pleasant 70 year old male who  fell approximately 2 weeks prior to admission and fractured his left ulna  and radius. He underwent surgery to have these pinned. For 4 to 5 days prior  to admission, he developed left posterior pleuritic chest pain, which was  quite severe when he would lie flat. Chest x-ray obtained at [facility] revealed a left effusion, left atelectasis and he was  sent to [facility] for CT scan and the above results were obtained.  He was admitted through the emergency room at that time and IV heparin was  started. Coumadin therapy was started on the night of admission at 10 mg  nightly. PT and INR was checked daily after 48 hours and his INR on the day  of discharge was 2.2. It had been 2.0 the day prior to discharge. Plans were  made for Lovenox injections at home. However, this will be unnecessary since  his INR is therapeutic. Symptoms of left  pleuritic chest pain were treated  with IV Toradol with excellent relief of pain and at the time of discharge,  his left posterior pleuritic chest pain was minimal, not requiring  medication.   LABORATORY DATA:  Discharge labs included INR of 2.2. White count of 6,300.  Hemoglobin 12.2, platelet count 272,000. Factor 5 Leiden assay is pending.  Liver function studies on admission on October 30, 2003 were all within normal  limits. CPK on admission was 216, CK MB is 9.3, relative index is 4.3,  troponin 0.04.   EKG on October 31, 2003 revealed normal sinus rhythm with left anterior hemi-  block. R wave progression was normal.   CONDITION ON DISCHARGE:  The patient was discharged to home in improved  condition.   FOLLOW UP:  The patient be followed up in my office in 3 days, on November 06, 2003  for repeat chest x-ray to assess the left pleural effusion and to check  PT and INR and hemoglobin.                                                Candyce Churn, M.D.    RNG/MEDQ  D:  11/03/2003  T:  11/04/2003  Job:  [id number]

## 2011-04-10 LAB — DIFFERENTIAL
Basophils Absolute: 0
Basophils Relative: 1
Eosinophils Absolute: 0.1
Eosinophils Relative: 2
Lymphocytes Relative: 32
Lymphs Abs: 1.9
Monocytes Absolute: 0.4
Monocytes Relative: 7
Neutro Abs: 3.5
Neutrophils Relative %: 58

## 2011-04-10 LAB — TYPE AND SCREEN
ABO/RH(D): O NEG
Antibody Screen: NEGATIVE

## 2011-04-10 LAB — FERRITIN: Ferritin: 248 (ref 22–322)

## 2011-04-10 LAB — COMPREHENSIVE METABOLIC PANEL
ALT: 24
AST: 35
Albumin: 4
Albumin: 2.6 — ABNORMAL LOW
Alkaline Phosphatase: 55
BUN: 20
BUN: 83 — ABNORMAL HIGH
CO2: 27
Calcium: 9.5
Calcium: 8.7
Chloride: 109
Chloride: 103
Creatinine, Ser: 1.28
Creatinine, Ser: 6.6 — ABNORMAL HIGH
GFR calc Af Amer: >60
GFR calc non Af Amer: 56 — ABNORMAL LOW
Glucose, Bld: 103 — ABNORMAL HIGH
Potassium: 4.9
Sodium: 141
Total Bilirubin: 1
Total Bilirubin: 1
Total Protein: 6.2

## 2011-04-10 LAB — BASIC METABOLIC PANEL
BUN: 12
CO2: 29
Calcium: 8.3 — ABNORMAL LOW
Chloride: 105
Creatinine, Ser: 1.19
GFR calc Af Amer: >60
GFR calc Af Amer: >60
GFR calc non Af Amer: >60
GFR calc non Af Amer: >60
Glucose, Bld: 109 — ABNORMAL HIGH
Glucose, Bld: 138 — ABNORMAL HIGH
Potassium: 4.4
Potassium: 4.5
Sodium: 139
Sodium: 142

## 2011-04-10 LAB — COMPREHENSIVE METABOLIC PANEL WITH GFR
ALT: 21
AST: 38 — ABNORMAL HIGH
Albumin: 2.5 — ABNORMAL LOW
Alkaline Phosphatase: 58
BUN: 31 — ABNORMAL HIGH
CO2: 25
Calcium: 8.3 — ABNORMAL LOW
Chloride: 107
Creatinine, Ser: 1.54 — ABNORMAL HIGH
GFR calc non Af Amer: 46 — ABNORMAL LOW
Glucose, Bld: 119 — ABNORMAL HIGH
Potassium: 4.6
Sodium: 140
Total Bilirubin: 0.9
Total Protein: 5.6 — ABNORMAL LOW

## 2011-04-10 LAB — BASIC METABOLIC PANEL WITH GFR
BUN: 18
BUN: 45 — ABNORMAL HIGH
CO2: 25
CO2: 26
Calcium: 8.3 — ABNORMAL LOW
Calcium: 8.4
Chloride: 108
Chloride: 108
Creatinine, Ser: 1.17
Creatinine, Ser: 2.35 — ABNORMAL HIGH
GFR calc non Af Amer: 28 — ABNORMAL LOW
GFR calc non Af Amer: >60
Glucose, Bld: 106 — ABNORMAL HIGH
Glucose, Bld: 136 — ABNORMAL HIGH
Potassium: 4.3
Potassium: 4.6
Sodium: 139
Sodium: 140

## 2011-04-10 LAB — CBC
HCT: 41.4
HCT: 23.7 — ABNORMAL LOW
HCT: 24 — ABNORMAL LOW
HCT: 27.2 — ABNORMAL LOW
HCT: 28.8 — ABNORMAL LOW
HCT: 30.9 — ABNORMAL LOW
HCT: 32.3 — ABNORMAL LOW
HCT: 34.5 — ABNORMAL LOW
Hemoglobin: 13.9
Hemoglobin: 10 — ABNORMAL LOW
Hemoglobin: 10.4 — ABNORMAL LOW
Hemoglobin: 11.2 — ABNORMAL LOW
Hemoglobin: 11.9 — ABNORMAL LOW
Hemoglobin: 7.9 — CL
Hemoglobin: 9.3 — ABNORMAL LOW
MCHC: 33.7
MCHC: 33.5
MCHC: 33.7
MCHC: 34.1
MCHC: 34.4
MCHC: 34.6
MCHC: 34.7
MCHC: 35.3
MCV: 89.1
MCV: 87.6
MCV: 88.2
MCV: 88.4
MCV: 89.1
MCV: 89.3
MCV: 89.4
MCV: 89.8
Platelets: 155
Platelets: 116 — ABNORMAL LOW
Platelets: 117 — ABNORMAL LOW
Platelets: 120 — ABNORMAL LOW
Platelets: 127 — ABNORMAL LOW
Platelets: 135 — ABNORMAL LOW
Platelets: 150
Platelets: 180
Platelets: 203
RBC: 4.64
RBC: 2.68 — ABNORMAL LOW
RBC: 3.07 — ABNORMAL LOW
RBC: 3.22 — ABNORMAL LOW
RBC: 3.47 — ABNORMAL LOW
RBC: 3.61 — ABNORMAL LOW
RBC: 3.84 — ABNORMAL LOW
RDW: 15.1
RDW: 14.9
RDW: 14.9
RDW: 14.9
RDW: 15
RDW: 15
RDW: 15.4
RDW: 15.5
WBC: 6
WBC: 10.5
WBC: 11.8 — ABNORMAL HIGH
WBC: 14.6 — ABNORMAL HIGH
WBC: 6.6
WBC: 6.9
WBC: 7.6
WBC: 7.8
WBC: 8.6

## 2011-04-10 LAB — URINALYSIS, ROUTINE W REFLEX MICROSCOPIC
Bilirubin Urine: NEGATIVE
Bilirubin Urine: NEGATIVE
Glucose, UA: NEGATIVE
Glucose, UA: NEGATIVE
Hgb urine dipstick: NEGATIVE
Hgb urine dipstick: NEGATIVE
Ketones, ur: NEGATIVE
Nitrite: NEGATIVE
Protein, ur: NEGATIVE
Specific Gravity, Urine: 1.025
Specific Gravity, Urine: 1.01
Urobilinogen, UA: 0.2
pH: 5.5

## 2011-04-10 LAB — OCCULT BLOOD X 1 CARD TO LAB, STOOL: Fecal Occult Bld: NEGATIVE

## 2011-04-10 LAB — TSH: TSH: 0.868

## 2011-04-10 LAB — URINE MICROSCOPIC-ADD ON

## 2011-04-10 LAB — PROTIME-INR
INR: 1.1
INR: 1.1
INR: 1.7 — ABNORMAL HIGH
INR: 1.8 — ABNORMAL HIGH
INR: 2 — ABNORMAL HIGH
INR: 2.5 — ABNORMAL HIGH
INR: 2.6 — ABNORMAL HIGH
INR: 2.6 — ABNORMAL HIGH
INR: 2.9 — ABNORMAL HIGH
INR: 3 — ABNORMAL HIGH
Prothrombin Time: 14.2
Prothrombin Time: 14.9
Prothrombin Time: 20.8 — ABNORMAL HIGH
Prothrombin Time: 21.5 — ABNORMAL HIGH
Prothrombin Time: 23.3 — ABNORMAL HIGH
Prothrombin Time: 28 — ABNORMAL HIGH
Prothrombin Time: 28.8 — ABNORMAL HIGH
Prothrombin Time: 29.1 — ABNORMAL HIGH
Prothrombin Time: 31 — ABNORMAL HIGH

## 2011-04-10 LAB — RETICULOCYTES
RBC.: 2.75 — ABNORMAL LOW
Retic Count, Absolute: 19.3
Retic Ct Pct: 0.7

## 2011-04-10 LAB — POCT I-STAT, CHEM 8
Calcium, Ion: 1.04 — ABNORMAL LOW
Glucose, Bld: 108 — ABNORMAL HIGH
HCT: 25 — ABNORMAL LOW
Hemoglobin: 8.5 — ABNORMAL LOW
TCO2: 19

## 2011-04-10 LAB — CROSSMATCH

## 2011-04-10 LAB — IRON AND TIBC
Iron: 15 — ABNORMAL LOW
Saturation Ratios: 6 — ABNORMAL LOW
TIBC: 238
UIBC: 223

## 2011-04-10 LAB — OSMOLALITY, URINE: Osmolality, Ur: 188 — ABNORMAL LOW

## 2011-04-10 LAB — APTT: aPTT: 29

## 2011-04-10 LAB — HEMOGLOBIN AND HEMATOCRIT, BLOOD
HCT: 31 — ABNORMAL LOW
Hemoglobin: 10.4 — ABNORMAL LOW

## 2011-04-10 LAB — VITAMIN B12: Vitamin B-12: 151 — ABNORMAL LOW (ref 211–911)

## 2011-04-10 LAB — CREATININE, URINE, RANDOM: Creatinine, Urine: 53.4

## 2011-04-10 LAB — FOLATE: Folate: 10.7

## 2011-04-10 LAB — SODIUM, URINE, RANDOM: Sodium, Ur: 21

## 2013-01-25 ENCOUNTER — Emergency Department (HOSPITAL_COMMUNITY)
Admission: EM | Admit: 2013-01-25 | Discharge: 2013-01-25 | Disposition: A | Discharge status: Home / Self Care | Payer: Medicare Other | Attending: Emergency Medicine | Admitting: Emergency Medicine

## 2013-01-25 ENCOUNTER — Other Ambulatory Visit: Payer: Self-pay

## 2013-01-25 ENCOUNTER — Emergency Department (HOSPITAL_COMMUNITY): Payer: Medicare Other

## 2013-01-25 ENCOUNTER — Encounter (HOSPITAL_COMMUNITY): Payer: Self-pay | Admitting: *Deleted

## 2013-01-25 DIAGNOSIS — I10 Essential (primary) hypertension: Secondary | ICD-10-CM | POA: Insufficient documentation

## 2013-01-25 DIAGNOSIS — Z79899 Other long term (current) drug therapy: Secondary | ICD-10-CM | POA: Insufficient documentation

## 2013-01-25 DIAGNOSIS — M79609 Pain in unspecified limb: Secondary | ICD-10-CM | POA: Insufficient documentation

## 2013-01-25 DIAGNOSIS — I2510 Atherosclerotic heart disease of native coronary artery without angina pectoris: Secondary | ICD-10-CM | POA: Insufficient documentation

## 2013-01-25 DIAGNOSIS — M79601 Pain in right arm: Secondary | ICD-10-CM

## 2013-01-25 DIAGNOSIS — Z86711 Personal history of pulmonary embolism: Secondary | ICD-10-CM | POA: Insufficient documentation

## 2013-01-25 DIAGNOSIS — R110 Nausea: Secondary | ICD-10-CM | POA: Insufficient documentation

## 2013-01-25 DIAGNOSIS — IMO0001 Reserved for inherently not codable concepts without codable children: Secondary | ICD-10-CM | POA: Insufficient documentation

## 2013-01-25 DIAGNOSIS — R0602 Shortness of breath: Secondary | ICD-10-CM | POA: Insufficient documentation

## 2013-01-25 DIAGNOSIS — Z7982 Long term (current) use of aspirin: Secondary | ICD-10-CM | POA: Insufficient documentation

## 2013-01-25 HISTORY — DX: Other pulmonary embolism without acute cor pulmonale: I26.99

## 2013-01-25 HISTORY — DX: Essential (primary) hypertension: I10

## 2013-01-25 HISTORY — DX: Disorder of kidney and ureter, unspecified: N28.9

## 2013-01-25 HISTORY — DX: Atherosclerotic heart disease of native coronary artery without angina pectoris: I25.10

## 2013-01-25 LAB — CBC
HCT: 44.2 % (ref 39.0–52.0)
MCV: 90.2 fL (ref 78.0–100.0)
Platelets: 140 10*3/uL — ABNORMAL LOW (ref 150–400)
RBC: 4.9 MIL/uL (ref 4.22–5.81)
WBC: 6.4 10*3/uL (ref 4.0–10.5)

## 2013-01-25 LAB — BASIC METABOLIC PANEL
CO2: 26 mEq/L (ref 19–32)
Chloride: 103 mEq/L (ref 96–112)
GFR calc Af Amer: 78 mL/min — ABNORMAL LOW (ref >90)
Sodium: 140 mEq/L (ref 135–145)

## 2013-01-25 LAB — POCT I-STAT TROPONIN I: Troponin i, poc: 0 ng/mL (ref 0.00–0.08)

## 2013-01-25 MED ORDER — SODIUM CHLORIDE 0.9 % IV BOLUS (SEPSIS)
500.0000 mL | Freq: Once | INTRAVENOUS | Status: AC
  Administered 2013-01-25: 500 mL via INTRAVENOUS

## 2013-01-25 MED ORDER — ASPIRIN 81 MG PO CHEW
162.0000 mg | CHEWABLE_TABLET | Freq: Once | ORAL | Status: AC
  Administered 2013-01-25: 162 mg via ORAL
  Filled 2013-01-25: qty 2

## 2013-01-25 MED ORDER — ONDANSETRON HCL 4 MG/2ML IJ SOLN
4.0000 mg | Freq: Once | INTRAMUSCULAR | Status: AC
  Administered 2013-01-25: 4 mg via INTRAVENOUS
  Filled 2013-01-25: qty 2

## 2013-01-25 MED ORDER — IOHEXOL 350 MG/ML SOLN
100.0000 mL | Freq: Once | INTRAVENOUS | Status: AC | PRN
Start: 2013-01-25 — End: 2013-01-25
  Administered 2013-01-25: 100 mL via INTRAVENOUS

## 2013-01-25 MED ORDER — HYDROCODONE-ACETAMINOPHEN 5-325 MG PO TABS: ORAL_TABLET | ORAL | Status: DC

## 2013-01-25 MED ORDER — NITROGLYCERIN 0.4 MG SL SUBL
0.4000 mg | SUBLINGUAL_TABLET | SUBLINGUAL | Status: DC | PRN
  Administered 2013-01-25: 0.4 mg via SUBLINGUAL

## 2013-01-25 NOTE — ED Notes (Signed)
Onset this am of right arm/shoulder pain, radiates down into his chest. Having nausea and mild sob. ekg done at triage.

## 2013-01-25 NOTE — ED Provider Notes (Signed)
History    CSN: [id number] Arrival date & time 01/25/13  1440  First MD Initiated Contact with Patient 01/25/13 1533     Chief Complaint  Patient presents with  . Chest Pain   (Consider location/radiation/quality/duration/timing/severity/associated sxs/prior Treatment) HPI Comments: Patient with history of high blood pressure, high cholesterol, previous pulmonary emboli after surgery-not currently on any anticoagulation other than two baby aspirin per day -- presents with acute onset of right upper arm pain with radiation described as shooting down into his right forearm. He denies weakness, numbness, or tingling of his arm. He denies injury. Pain does not made worse by position or movement. He has had nausea with severe pain but no vomiting. He denies any chest or neck pain. He denies lower extremity edema. He denies fever or cough. He has not had pain like this in the past. Patient reports having a cardiac catheterization in 2005 which showed mild blockages but he did not require any stents. Onset of symptoms gradual. Course is constant. Nothing makes symptoms better or worse. Patient took 2 baby aspirin this morning. No other treatments prior to arrival.  Patient is a 72 y.o. male presenting with chest pain. The history is provided by the patient and medical records.  Chest Pain Associated symptoms: nausea   Associated symptoms: no abdominal pain, no back pain, no cough, no diaphoresis, no fever, no headache, no palpitations, no shortness of breath and not vomiting    Past Medical History  Diagnosis Date  . Coronary artery disease   . Pulmonary embolism   . Hypertension    Past Surgical History  Procedure Laterality Date  . Joint replacement    . Back surgery     History reviewed. No pertinent family history. History  Substance Use Topics  . Smoking status: Not on file  . Smokeless tobacco: Not on file  . Alcohol Use: No    Review of Systems  Constitutional: Negative for  fever and diaphoresis.  HENT: Negative for sore throat, rhinorrhea and neck pain.   Eyes: Negative for redness.  Respiratory: Negative for cough and shortness of breath.   Cardiovascular: Negative for chest pain, palpitations and leg swelling.  Gastrointestinal: Positive for nausea. Negative for vomiting, abdominal pain and diarrhea.  Genitourinary: Negative for dysuria.  Musculoskeletal: Positive for myalgias and arthralgias. Negative for back pain.  Skin: Negative for rash.  Neurological: Negative for syncope, light-headedness and headaches.    Allergies  Review of patient's allergies indicates no known allergies.  Home Medications   Current Outpatient Rx  Name  Route  Sig  Dispense  Refill  . allopurinol (ZYLOPRIM) 300 MG tablet   Oral   Take 300 mg by mouth daily.         Marland Kitchen aspirin 81 MG tablet   Oral   Take 81 mg by mouth 2 (two) times daily.         . B Complex-Biotin-FA (B-COMPLEX PO)   Oral   Take 1 tablet by mouth daily.         . Cholecalciferol (D3 SUPER STRENGTH) 2000 UNITS CAPS   Oral   Take 2,000 Units by mouth daily.         . finasteride (PROSCAR) 5 MG tablet   Oral   Take 5 mg by mouth daily.         . Omega-3 Fatty Acids (FISH OIL) 1000 MG CAPS   Oral   Take 1,000 mg by mouth daily.         Marland Kitchen  omeprazole (PRILOSEC) 20 MG capsule   Oral   Take 20 mg by mouth 2 (two) times daily.         . ramipril (ALTACE) 10 MG capsule   Oral   Take 10 mg by mouth daily.         . rosuvastatin (CRESTOR) 40 MG tablet   Oral   Take 40 mg by mouth daily.         . tamsulosin (FLOMAX) 0.4 MG CAPS   Oral   Take 0.4 mg by mouth daily.          BP 159/84  Pulse 64  Temp(Src) 97.8 F (36.6 C) (Oral)  Resp 18  SpO2 96% Physical Exam  Nursing note and vitals reviewed. Constitutional: He appears well-developed and well-nourished.  HENT:  Head: Normocephalic and atraumatic.  Mouth/Throat: Mucous membranes are normal. Mucous membranes are  not dry.  Eyes: Conjunctivae are normal.  Neck: Trachea normal and normal range of motion. Neck supple. Normal carotid pulses and no JVD present. No muscular tenderness present. Carotid bruit is not present. No tracheal deviation present.  Cardiovascular: Normal rate, regular rhythm, S1 normal, S2 normal, normal heart sounds and intact distal pulses.  Exam reveals no distant heart sounds and no decreased pulses.   No murmur heard. Pulses:      Radial pulses are 2+ on the right side, and 2+ on the left side.  Pulmonary/Chest: Effort normal and breath sounds normal. No respiratory distress. He has no wheezes. He exhibits no tenderness.  Abdominal: Soft. Normal aorta and bowel sounds are normal. There is no tenderness. There is no rebound and no guarding.  Musculoskeletal: He exhibits no edema and no tenderness.  No lower extremity edema.  Neurological: He is alert.  Skin: Skin is warm and dry. He is not diaphoretic. No cyanosis. No pallor.  Psychiatric: He has a normal mood and affect.    ED Course  Procedures (including critical care time) Labs Reviewed  BASIC METABOLIC PANEL - Abnormal; Notable for the following:    GFR calc non Af Amer 68 (*)    GFR calc Af Amer 78 (*)    All other components within normal limits  CBC - Abnormal; Notable for the following:    Platelets 140 (*)    All other components within normal limits  D-DIMER, QUANTITATIVE - Abnormal; Notable for the following:    D-Dimer, Quant 0.50 (*)    All other components within normal limits  POCT I-STAT TROPONIN I  POCT I-STAT TROPONIN I   Dg Chest 2 View  01/25/2013   *RADIOLOGY REPORT*  Clinical Data: Chest pain  CHEST - 2 VIEW  Comparison:  Nov 28, 2006  Views: PA and lateral views.  FINDINGS: There is no focal infiltrate, pulmonary edema, or pleural effusion. The mediastinal contour and cardiac silhouette are stable.  The soft tissue and osseous structures are stable.  IMPRESSION: No acute cardiopulmonary disease  identified.   Original Report Authenticated By: Sherian Rein, M.D.   Ct Angio Chest Pe W/cm &/or Wo Cm  01/25/2013   *RADIOLOGY REPORT*  Clinical Data: Elevated D-dimer.  Arm pain.  History of blood clots.  CT ANGIOGRAPHY CHEST  Technique:  Multidetector CT imaging of the chest using the standard protocol during bolus administration of intravenous contrast. Multiplanar reconstructed images including MIPs were obtained and reviewed to evaluate the vascular anatomy.  Contrast: OMNIPAQUE IOHEXOL 350 MG/ML SOLN  Comparison: There is no  Findings: No filling defects within  the pulmonary arteries to suggest acute pulmonary embolism.  No acute findings of the aorta great vessels. No pericardial fluid. Coronary calcifications noted. Esophagus is normal.  No axillary lymphadenopathy.  No mediastinal or hilar lymphadenopathy.  Review lung parenchyma demonstrates no pulmonary infarction.  No pulmonary edema or infection.  Airways are normal.  Limited view of the upper abdomen is unremarkable.  Review of the skeleton is unremarkable.  There are degenerative spurs of t thoracic spine.  IMPRESSION: No evidence of acute pulmonary embolism.  Coronary artery calcifications noted.   Original Report Authenticated By: Genevive Bi, M.D.   1. Arm pain, right     3:50 PM Patient seen and examined. Work-up initiated. Medications ordered.   Vital signs reviewed and are as follows: Filed Vitals:   01/25/13 1446  BP: 159/84  Pulse: 64  Temp: 97.8 F (36.6 C)  Resp: 18    Date: 01/25/2013  Rate: 60  Rhythm: normal sinus rhythm  QRS Axis: normal  Intervals: normal  ST/T Wave abnormalities: normal  Conduction Disutrbances:none  Narrative Interpretation:   Old EKG Reviewed: none available  Pt was discussed with Dr. Clarene Duke.   CT performed after d-dimer marginally elevated. This did not show PE or other abnormality.   Two cardiac markers were negative.   Patient was given NTG which did not change pain.    I asked the patient again if he had pain going into his chest at any time. He states no. Patient has not moved beyond upper arm. The nursing triage note is inaccurate.   I discussed all results with patient and wife at length. We discussed options of admission vs discharge and risks/benefits of each. Patient would like to go home. He has close PCP follow-up and states can see his doctor tomorrow if needed. He agrees to return with worsening. Wife is in agreement. Patient and wife seem reliable to return with worsening.   Patient was counseled to return with severe chest pain, especially if the pain is crushing or pressure-like and spreads to the arms, back, neck, or jaw, or if they have sweating, nausea, or shortness of breath with the pain. They were encouraged to call 911 with these symptoms.   They were also told to return if their chest pain gets worse and does not go away with rest, they have an attack of chest pain lasting longer than usual despite rest and treatment with the medications their caregiver has prescribed, if they wake from sleep with chest pain or shortness of breath, if they feel dizzy or faint, if they have chest pain not typical of their usual pain, or if they have any other emergent concerns regarding their health.  The patient verbalized understanding and agreed.       MDM  Patient with arm pain. No chest pain at any time. No SOB. H/o PE -- CT neg for PE. H/o mild non-obstructive CAD, however not evaluated with cath in 9 yrs. Pain is extremely atypical. Poor story for ACS, normal EKG, neg delta trop. Feel very low risk for ACS. Character of pain is most consistent with cervical radiculopathy, but no h/o same. Pulses normal, no evidence of ischemia. As patient wants to go home and has reliable PCP f/u, feel safe for discharge at this time. Shared decision making employee after discussed of all results, risks/benefits with patient.     Renne Crigler, PA-C 01/25/13  2001

## 2013-01-28 NOTE — ED Provider Notes (Signed)
Medical screening examination/treatment/procedure(s) were performed by non-physician practitioner and as supervising physician I was immediately available for consultation/collaboration.   Laray Anger, DO 01/28/13 [phone]

## 2015-07-18 ENCOUNTER — Other Ambulatory Visit: Payer: Self-pay | Admitting: Internal Medicine

## 2015-07-18 DIAGNOSIS — R1032 Left lower quadrant pain: Secondary | ICD-10-CM | POA: Diagnosis not present

## 2015-07-18 DIAGNOSIS — K5790 Diverticulosis of intestine, part unspecified, without perforation or abscess without bleeding: Secondary | ICD-10-CM | POA: Diagnosis not present

## 2015-07-19 ENCOUNTER — Ambulatory Visit
Admission: RE | Admit: 2015-07-19 | Discharge: 2015-07-19 | Disposition: A | Discharge status: Home / Self Care | Payer: PPO | Source: Ambulatory Visit | Attending: Internal Medicine | Admitting: Internal Medicine

## 2015-07-19 DIAGNOSIS — K5732 Diverticulitis of large intestine without perforation or abscess without bleeding: Secondary | ICD-10-CM | POA: Diagnosis not present

## 2015-07-19 DIAGNOSIS — R1032 Left lower quadrant pain: Secondary | ICD-10-CM

## 2015-07-19 MED ORDER — IOPAMIDOL (ISOVUE-300) INJECTION 61%: 125.0000 mL | Freq: Once | INTRAVENOUS | Status: DC | PRN

## 2015-07-31 ENCOUNTER — Encounter: Payer: Self-pay | Admitting: Vascular Surgery

## 2015-08-08 ENCOUNTER — Ambulatory Visit (INDEPENDENT_AMBULATORY_CARE_PROVIDER_SITE_OTHER): Payer: PPO | Admitting: Vascular Surgery

## 2015-08-08 ENCOUNTER — Encounter: Payer: Self-pay | Admitting: Vascular Surgery

## 2015-08-08 VITALS — BP 113/75 | HR 51 | Temp 97.1°F | Resp 14 | Ht 69.0 in | Wt 215.0 lb

## 2015-08-08 DIAGNOSIS — I723 Aneurysm of iliac artery: Secondary | ICD-10-CM | POA: Diagnosis not present

## 2015-08-08 NOTE — Progress Notes (Signed)
Referred by: Josetta Huddle, MD [address] [place] [address] [place], [place] [address]  Reason for referral: bilateral iliac artery aneurysm  History of Present Illness  The patient is a 75 y.o. (04-Sep-1940) male who presents with chief complaint: "aneurysms".  This patient has known diverticulitis.  On a recent, CT he was found to have bilateral CIA aneurysms: R CIA 2.1 cm, L CIA 1.5 cm.  The patient does not have back pain but he does have abdominal felt to be due to diverticulitis.  The patient notes no history of embolic episodes in either leg.  He has a known history of PE related to DVT post-operative.  The patient's risk factors for aneurysmal disease included: male sex and age.  The patient not smoke cigarettes.  He has no family history of aneurysmal disease.   Past Medical History  Diagnosis Date  . Coronary artery disease   . Pulmonary embolism ([place])   . Hypertension   . Renal insufficiency     hx renal failure 2008 after blood clots    Past Surgical History  Procedure Laterality Date  . Joint replacement    . Back surgery      Social History   Social History  . Marital Status: Married    Spouse Name: N/A  . Number of Children: N/A  . Years of Education: N/A   Occupational History  . Not on file.   Social History Main Topics  . Smoking status: Never Smoker   . Smokeless tobacco: Never Used  . Alcohol Use: No  . Drug Use: No  . Sexual Activity: Not on file   Other Topics Concern  . Not on file   Social History Narrative    History reviewed. No pertinent family history.  Current Outpatient Prescriptions  Medication Sig Dispense Refill  . allopurinol (ZYLOPRIM) 300 MG tablet Take 300 mg by mouth daily.    Marland Kitchen aspirin 81 MG tablet Take 81 mg by mouth 2 (two) times daily.    . B Complex-Biotin-FA (B-COMPLEX PO) Take 1 tablet by mouth daily.    . Cholecalciferol (D3 SUPER STRENGTH) 2000 UNITS CAPS Take 2,000 Units by mouth daily.    . finasteride  (PROSCAR) 5 MG tablet Take 5 mg by mouth daily.    Marland Kitchen losartan (COZAAR) 50 MG tablet Take 50 mg by mouth every morning.    . Omega-3 Fatty Acids (FISH OIL) 1000 MG CAPS Take 1,000 mg by mouth daily.    Marland Kitchen omeprazole (PRILOSEC) 20 MG capsule Take 20 mg by mouth 2 (two) times daily.    . rosuvastatin (CRESTOR) 40 MG tablet Take 40 mg by mouth daily.    . tamsulosin (FLOMAX) 0.4 MG CAPS Take 0.4 mg by mouth daily.    Marland Kitchen HYDROcodone-acetaminophen (NORCO/VICODIN) 5-325 MG per tablet Take 1-2 tablets every 6 hours as needed for severe pain (Patient not taking: Reported on 08/08/2015) 6 tablet 0  . ramipril (ALTACE) 10 MG capsule Take 10 mg by mouth daily. Reported on 08/08/2015     No current facility-administered medications for this visit.     No Known Allergies   REVIEW OF SYSTEMS:  (Positives checked otherwise negative)  CARDIOVASCULAR:   [ ]  chest pain,  [ ]  chest pressure,  [ ]  palpitations,  [ ]  shortness of breath when laying flat,  [ ]  shortness of breath with exertion,   [ ]  pain in feet when walking,  [ ]  pain in feet when laying flat, [x]   history of blood clot in veins (DVT),  [ ]  history of phlebitis,  [ ]  swelling in legs,  [ ]  varicose veins  PULMONARY:   [ ]  productive cough,  [ ]  asthma,  [ ]  wheezing [x]  h/o PE  NEUROLOGIC:   [ ]  weakness in arms or legs,  [ ]  numbness in arms or legs,  [ ]  difficulty speaking or slurred speech,  [ ]  temporary loss of vision in one eye,  [ ]  dizziness  HEMATOLOGIC:   [ ]  bleeding problems,  [ ]  problems with blood clotting too easily  MUSCULOSKEL:   [ ]  joint pain, [ ]  joint swelling  GASTROINTEST:   [ ]  vomiting blood,  [ ]  blood in stool    [x]  abd pain related to diverticulitis  GENITOURINARY:   [ ]  burning with urination,  [ ]  blood in urine  PSYCHIATRIC:   [ ]  history of major depression  INTEGUMENTARY:   [ ]  rashes,  [ ]  ulcers  CONSTITUTIONAL:   [ ]  fever,  [ ]  chills   For VQI Use Only  PRE-ADM  LIVING: Home  AMB STATUS: Ambulatory  CAD Sx: None  PRIOR CHF: None  STRESS TEST: [x]  No, [ ]  Normal, [ ]  + ischemia, [ ]  + MI, [ ]  Both   Physical Examination  Filed Vitals:   08/08/15 0921  BP: 113/75  Pulse: 51  Temp: 97.1 F (36.2 C)  TempSrc: Oral  Resp: 14  Height: 5\' 9"  (1.753 m)  Weight: 215 lb (97.523 kg)  SpO2: 97%   Body mass index is 31.74 kg/(m^2).  General: A&O x 3, WDWN  Head: [place]/AT  Ear/Nose/Throat: Hearing grossly intact, nares w/o erythema or drainage, oropharynx w/o Erythema/Exudate  Eyes: PERRLA, EOMI  Neck: Supple, no nuchal rigidity, no palpable LAD  Pulmonary: Sym exp, good air movt, CTAB, no rales, rhonchi, & wheezing  Cardiac: RRR, Nl S1, S2, no Murmurs, rubs or gallops  Vascular: Vessel Right Left  Radial Palpable Palpable  Brachial Palpable Palpable  Carotid Palpable, without bruit Palpable, without bruit  Aorta Not palpable N/A  Femoral Palpable Palpable  Popliteal Faintly palpable Not palpable  PT Palpable Palpable  DP Palpable Palpable   Gastrointestinal: soft, NTND, no G/R, no HSM, no masses, no CVAT B, no palpable AAA  Musculoskeletal: M/S 5/5 throughout , Extremities without ischemic changes   Neurologic: CN 2-12 intact grossly intact, Pain and light touch intact in extremities , Motor exam as listed above  Psychiatric: Judgment intact, Mood & affect appropriate for pt's clinical situation  Dermatologic: See M/S exam for extremity exam, no rashes otherwise noted  Lymph : No Cervical, Axillary, or Inguinal lymphadenopathy    CT abd/pelvis with PO/IV contrast (07/19/15)  1. Continued diverticulitis in the descending colon. A collection of fluid and gas along the medial aspect of the descending colon, at the site of diverticulitis, is consistent with an intramural abscess or contained perforation. This may explain the patient's resistance to treatment. 2. 10 mm lingular nodule. This appears to be associated  with adjacent scarring. Recommend a three to six-month follow-up CT of the chest to ensure stability or resolution. 3. 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 The left common iliac artery is also prominent measuring 1.5 Cm.  Based on my review of this patient's CT, he has aortic ectasia without aneurysm, bilateral small CIA aneurysm (~2.0 cm) without thrombus.  Her has scattered atherosclerosis in the aortoiliac system.  Outside Studies/Documentation 4 pages of outside documents were reviewed including: outpatient PCP chart, CT report.   Medical Decision Making  The patient is a 75 y.o. male who presents with: B small CIA aneurysm, aortic ectasia   Based on this patient's exam and diagnostic studies, he needs repeat aortoiliac duplex in one year.  The threshold for repair is iliac aneurysm > 3.5 cm.  I emphasized the importance of maximal medical management including strict control of blood pressure, blood glucose, and lipid levels, antiplatelet agents, obtaining regular exercise, and cessation of smoking.    He is aware of the need to limit any activities which might spike his BP.  Also he will come to ED in the event of any severe back or abdominal pain as a precaution.  Thank you for allowing [place] to participate in this patient's care.   Adele Barthel, MD [facility] Office: [phone] Pager: [phone]  08/08/2015, 9:55 AM

## 2015-08-08 NOTE — Addendum Note (Signed)
Addended by: Dorthula Rue L on: 08/08/2015 11:07 AM   Modules accepted: Orders

## 2015-11-01 DIAGNOSIS — M79672 Pain in left foot: Secondary | ICD-10-CM | POA: Diagnosis not present

## 2016-02-20 DIAGNOSIS — G4733 Obstructive sleep apnea (adult) (pediatric): Secondary | ICD-10-CM | POA: Diagnosis not present

## 2016-03-12 DIAGNOSIS — N5201 Erectile dysfunction due to arterial insufficiency: Secondary | ICD-10-CM | POA: Diagnosis not present

## 2016-03-12 DIAGNOSIS — N401 Enlarged prostate with lower urinary tract symptoms: Secondary | ICD-10-CM | POA: Diagnosis not present

## 2016-03-12 DIAGNOSIS — R350 Frequency of micturition: Secondary | ICD-10-CM | POA: Diagnosis not present

## 2016-07-16 ENCOUNTER — Other Ambulatory Visit: Payer: Self-pay | Admitting: Internal Medicine

## 2016-07-16 DIAGNOSIS — I2510 Atherosclerotic heart disease of native coronary artery without angina pectoris: Secondary | ICD-10-CM | POA: Diagnosis not present

## 2016-07-16 DIAGNOSIS — I10 Essential (primary) hypertension: Secondary | ICD-10-CM | POA: Diagnosis not present

## 2016-07-16 DIAGNOSIS — R1314 Dysphagia, pharyngoesophageal phase: Secondary | ICD-10-CM | POA: Diagnosis not present

## 2016-07-16 DIAGNOSIS — R1310 Dysphagia, unspecified: Secondary | ICD-10-CM

## 2016-07-16 DIAGNOSIS — N5201 Erectile dysfunction due to arterial insufficiency: Secondary | ICD-10-CM | POA: Diagnosis not present

## 2016-07-16 DIAGNOSIS — Z86010 Personal history of colonic polyps: Secondary | ICD-10-CM | POA: Diagnosis not present

## 2016-07-16 DIAGNOSIS — G4733 Obstructive sleep apnea (adult) (pediatric): Secondary | ICD-10-CM | POA: Diagnosis not present

## 2016-07-16 DIAGNOSIS — Z1211 Encounter for screening for malignant neoplasm of colon: Secondary | ICD-10-CM | POA: Diagnosis not present

## 2016-07-16 DIAGNOSIS — E7800 Pure hypercholesterolemia, unspecified: Secondary | ICD-10-CM | POA: Diagnosis not present

## 2016-07-16 DIAGNOSIS — Z1389 Encounter for screening for other disorder: Secondary | ICD-10-CM | POA: Diagnosis not present

## 2016-07-16 DIAGNOSIS — Z0000 Encounter for general adult medical examination without abnormal findings: Secondary | ICD-10-CM | POA: Diagnosis not present

## 2016-07-16 DIAGNOSIS — Z79899 Other long term (current) drug therapy: Secondary | ICD-10-CM | POA: Diagnosis not present

## 2016-07-16 DIAGNOSIS — J309 Allergic rhinitis, unspecified: Secondary | ICD-10-CM | POA: Diagnosis not present

## 2016-07-16 DIAGNOSIS — E559 Vitamin D deficiency, unspecified: Secondary | ICD-10-CM | POA: Diagnosis not present

## 2016-07-21 ENCOUNTER — Ambulatory Visit
Admission: RE | Admit: 2016-07-21 | Discharge: 2016-07-21 | Disposition: A | Discharge status: Home / Self Care | Payer: PPO | Source: Ambulatory Visit | Attending: Internal Medicine | Admitting: Internal Medicine

## 2016-07-21 DIAGNOSIS — K449 Diaphragmatic hernia without obstruction or gangrene: Secondary | ICD-10-CM | POA: Diagnosis not present

## 2016-07-21 DIAGNOSIS — R1310 Dysphagia, unspecified: Secondary | ICD-10-CM

## 2016-07-29 DIAGNOSIS — G4733 Obstructive sleep apnea (adult) (pediatric): Secondary | ICD-10-CM | POA: Diagnosis not present

## 2016-08-04 DIAGNOSIS — G4733 Obstructive sleep apnea (adult) (pediatric): Secondary | ICD-10-CM | POA: Diagnosis not present

## 2016-08-08 ENCOUNTER — Encounter (HOSPITAL_COMMUNITY): Payer: PPO

## 2016-08-08 ENCOUNTER — Ambulatory Visit: Payer: PPO | Admitting: Vascular Surgery

## 2016-08-28 ENCOUNTER — Other Ambulatory Visit: Payer: Self-pay | Admitting: Gastroenterology

## 2016-09-02 ENCOUNTER — Encounter: Payer: Self-pay | Admitting: Vascular Surgery

## 2016-09-02 NOTE — Progress Notes (Signed)
Established Abdominal Aortic Aneurysm  History of Present Illness  The patient is a 76 y.o. ([birth year]) male who presents with chief complaint: follow up for aortic ectasia and B small CIA aneurysm.  Previous studies demonstrate R CIA 2 cm and L CIA 1.5 cm.  The patient does NOT have back or abdominal pain.  The patient is NOT a smoker.  The patient's PMH, PSH, SH, and FamHx are unchanged from 08/08/15.  Current Outpatient Prescriptions  Medication Sig Dispense Refill  . allopurinol (ZYLOPRIM) 300 MG tablet Take 300 mg by mouth daily.    Marland Kitchen aspirin 81 MG tablet Take 81 mg by mouth 2 (two) times daily.    . B Complex-Biotin-FA (B-COMPLEX PO) Take 1 tablet by mouth daily.    . Cholecalciferol (D3 SUPER STRENGTH) 2000 UNITS CAPS Take 2,000 Units by mouth daily.    . finasteride (PROSCAR) 5 MG tablet Take 5 mg by mouth daily.    Marland Kitchen HYDROcodone-acetaminophen (NORCO/VICODIN) 5-325 MG per tablet Take 1-2 tablets every 6 hours as needed for severe pain (Patient not taking: Reported on 08/08/2015) 6 tablet 0  . losartan (COZAAR) 50 MG tablet Take 50 mg by mouth every morning.    . Omega-3 Fatty Acids (FISH OIL) 1000 MG CAPS Take 1,000 mg by mouth daily.    Marland Kitchen omeprazole (PRILOSEC) 20 MG capsule Take 20 mg by mouth 2 (two) times daily.    . ramipril (ALTACE) 10 MG capsule Take 10 mg by mouth daily. Reported on 08/08/2015    . rosuvastatin (CRESTOR) 40 MG tablet Take 40 mg by mouth daily.    . tamsulosin (FLOMAX) 0.4 MG CAPS Take 0.4 mg by mouth daily.     No current facility-administered medications for this visit.     No Known Allergies  On ROS today: no abdominal or back pain, no thromboembolic sx   Physical Examination  There were no vitals filed for this visit. There is no height or weight on file to calculate BMI.  General: A&O x 3, WDWN  Pulmonary: Sym exp, good air movt, CTAB, no rales, rhonchi, & wheezing  Cardiac: RRR, Nl S1, S2, no Murmurs, rubs or gallops  Vascular: Vessel  Right Left  Radial Palpable Palpable  Brachial Palpable Palpable  Carotid Palpable, without bruit Palpable, without bruit  Aorta Not palpable N/A  Femoral Palpable Palpable  Popliteal Not palpable Not palpable  PT Palpable Palpable  DP Palpable Palpable   Gastrointestinal: soft, NTND, no G/R, no HSM, no masses, no CVAT B  Musculoskeletal: M/S 5/5 throughout , Extremities without ischemic changes   Neurologic:  Pain and light touch intact in extremities , Motor exam as listed above  Non-Invasive Vascular Imaging  AAA Duplex (09/02/2016)  Previous size: <3 cm on CTA 07/19/15  Current size:  2.7 cm x 2.9 cm (Date: 09/05/2016 )  R CIA: 1.7 cm x 1.8 cm (2.1 cm)  L CIA: 1.7 cm x 1.7 cm (1.5 cm)   Medical Decision Making  The patient is a 76 y.o. male who presents with: asymptomatic aortic ectasia and B small CIA aneurysm   Pt basically has had minimal change in his aortoiliac arterial sizes.    I discussed with the pt sx of aortic and iliac aneurysm.  His current rupture risks are <1% / year.  Based on this patient's exam and diagnostic studies, he needs q2 year aortic duplex.  The threshold for repair is AAA size > 5.5 cm, growth > 1 cm/yr, and symptomatic  status.  I emphasized the importance of maximal medical management including strict control of blood pressure, blood glucose, and lipid levels, antiplatelet agents, obtaining regular exercise, and cessation of smoking.    Thank you for allowing [place] to participate in this patient's care.   Adele Barthel, MD, FACS [facility] Office: [phone] Pager: [phone]

## 2016-09-05 ENCOUNTER — Encounter: Payer: Self-pay | Admitting: Vascular Surgery

## 2016-09-05 ENCOUNTER — Ambulatory Visit (INDEPENDENT_AMBULATORY_CARE_PROVIDER_SITE_OTHER): Payer: PPO | Admitting: Vascular Surgery

## 2016-09-05 ENCOUNTER — Ambulatory Visit (HOSPITAL_COMMUNITY)
Admission: RE | Admit: 2016-09-05 | Discharge: 2016-09-05 | Disposition: A | Discharge status: Home / Self Care | Payer: PPO | Source: Ambulatory Visit | Attending: Vascular Surgery | Admitting: Vascular Surgery

## 2016-09-05 VITALS — BP 112/70 | HR 55 | Temp 97.7°F | Resp 18 | Ht 69.0 in | Wt 216.0 lb

## 2016-09-05 DIAGNOSIS — I723 Aneurysm of iliac artery: Secondary | ICD-10-CM | POA: Diagnosis not present

## 2016-09-05 DIAGNOSIS — I77811 Abdominal aortic ectasia: Secondary | ICD-10-CM

## 2016-09-10 NOTE — Addendum Note (Signed)
Addended by: Lianne Cure A on: 09/10/2016 02:52 PM   Modules accepted: Orders

## 2016-10-16 ENCOUNTER — Encounter (HOSPITAL_COMMUNITY): Payer: Self-pay | Admitting: *Deleted

## 2016-10-20 ENCOUNTER — Ambulatory Visit (HOSPITAL_COMMUNITY): Payer: PPO | Admitting: Registered Nurse

## 2016-10-20 ENCOUNTER — Ambulatory Visit (HOSPITAL_COMMUNITY)
Admission: RE | Admit: 2016-10-20 | Discharge: 2016-10-20 | Disposition: A | Discharge status: Home / Self Care | Payer: PPO | Source: Ambulatory Visit | Attending: Gastroenterology | Admitting: Gastroenterology

## 2016-10-20 ENCOUNTER — Encounter (HOSPITAL_COMMUNITY)
Admission: RE | Disposition: A | Discharge status: Home / Self Care | Payer: Self-pay | Source: Ambulatory Visit | Attending: Gastroenterology

## 2016-10-20 ENCOUNTER — Encounter (HOSPITAL_COMMUNITY): Payer: Self-pay | Admitting: *Deleted

## 2016-10-20 DIAGNOSIS — E7800 Pure hypercholesterolemia, unspecified: Secondary | ICD-10-CM | POA: Insufficient documentation

## 2016-10-20 DIAGNOSIS — I10 Essential (primary) hypertension: Secondary | ICD-10-CM | POA: Insufficient documentation

## 2016-10-20 DIAGNOSIS — N400 Enlarged prostate without lower urinary tract symptoms: Secondary | ICD-10-CM | POA: Diagnosis not present

## 2016-10-20 DIAGNOSIS — G4733 Obstructive sleep apnea (adult) (pediatric): Secondary | ICD-10-CM | POA: Diagnosis not present

## 2016-10-20 DIAGNOSIS — G5600 Carpal tunnel syndrome, unspecified upper limb: Secondary | ICD-10-CM | POA: Insufficient documentation

## 2016-10-20 DIAGNOSIS — Z1211 Encounter for screening for malignant neoplasm of colon: Secondary | ICD-10-CM | POA: Insufficient documentation

## 2016-10-20 DIAGNOSIS — Z86010 Personal history of colonic polyps: Secondary | ICD-10-CM | POA: Diagnosis not present

## 2016-10-20 DIAGNOSIS — I2510 Atherosclerotic heart disease of native coronary artery without angina pectoris: Secondary | ICD-10-CM | POA: Diagnosis not present

## 2016-10-20 DIAGNOSIS — Z96653 Presence of artificial knee joint, bilateral: Secondary | ICD-10-CM | POA: Insufficient documentation

## 2016-10-20 DIAGNOSIS — K5730 Diverticulosis of large intestine without perforation or abscess without bleeding: Secondary | ICD-10-CM | POA: Diagnosis not present

## 2016-10-20 DIAGNOSIS — Z86711 Personal history of pulmonary embolism: Secondary | ICD-10-CM | POA: Diagnosis not present

## 2016-10-20 DIAGNOSIS — I723 Aneurysm of iliac artery: Secondary | ICD-10-CM | POA: Diagnosis not present

## 2016-10-20 HISTORY — DX: Gastro-esophageal reflux disease without esophagitis: K21.9

## 2016-10-20 HISTORY — DX: Gout, unspecified: M10.9

## 2016-10-20 HISTORY — DX: Sleep apnea, unspecified: G47.30

## 2016-10-20 HISTORY — PX: COLONOSCOPY WITH PROPOFOL: SHX5780

## 2016-10-20 HISTORY — DX: Personal history of other diseases of the digestive system: Z87.19

## 2016-10-20 SURGERY — COLONOSCOPY WITH PROPOFOL
Anesthesia: Monitor Anesthesia Care

## 2016-10-20 MED ORDER — PROPOFOL 10 MG/ML IV BOLUS
INTRAVENOUS | Status: AC
  Filled 2016-10-20: qty 20

## 2016-10-20 MED ORDER — LIDOCAINE 2% (20 MG/ML) 5 ML SYRINGE
INTRAMUSCULAR | Status: AC
  Filled 2016-10-20: qty 5

## 2016-10-20 MED ORDER — SODIUM CHLORIDE 0.9 % IV SOLN: INTRAVENOUS | Status: DC

## 2016-10-20 MED ORDER — PROPOFOL 10 MG/ML IV BOLUS
INTRAVENOUS | Status: DC | PRN
  Administered 2016-10-20: 20 mg via INTRAVENOUS

## 2016-10-20 MED ORDER — PROPOFOL 500 MG/50ML IV EMUL
INTRAVENOUS | Status: DC | PRN
  Administered 2016-10-20: 130 ug/kg/min via INTRAVENOUS

## 2016-10-20 MED ORDER — LACTATED RINGERS IV SOLN
INTRAVENOUS | Status: DC
  Administered 2016-10-20: 1000 mL via INTRAVENOUS

## 2016-10-20 MED ORDER — PROPOFOL 10 MG/ML IV BOLUS
INTRAVENOUS | Status: AC
  Filled 2016-10-20: qty 40

## 2016-10-20 SURGICAL SUPPLY — 21 items

## 2016-10-20 NOTE — Discharge Instructions (Signed)

## 2016-10-20 NOTE — Anesthesia Postprocedure Evaluation (Signed)
Anesthesia Post Note  Patient: Ralph Blackwell  Procedure(s) Performed: Procedure(s) (LRB): COLONOSCOPY WITH PROPOFOL (N/A)  Patient location during evaluation: PACU Anesthesia Type: MAC Level of consciousness: awake and alert Pain management: pain level controlled Vital Signs Assessment: post-procedure vital signs reviewed and stable Respiratory status: spontaneous breathing, nonlabored ventilation, respiratory function stable and patient connected to nasal cannula oxygen Cardiovascular status: stable and blood pressure returned to baseline Anesthetic complications: no       Last Vitals:  Vitals:   10/20/16 0840 10/20/16 0845  BP: 106/64 124/68  Pulse: (!) 45 (!) 45  Resp: 11 13  Temp:      Last Pain:  Vitals:   10/20/16 0818  TempSrc: Oral                 Sheli Dorin DAVID

## 2016-10-20 NOTE — H&P (Signed)
Procedure: Surveillance colonoscopy.  History: The patient is a 76 year old male born 08/19/1940. He underwent a normal surveillance colonoscopy on 08/04/2011. He has a history of adenomatous colon polyps removed colonoscopically in the past. He is scheduled to undergo a repeat surveillance colonoscopy today.  Past medical history: Obstructive sleep apnea. Hypercholesterolemia. Hypertension. Allergic rhinitis. Bilateral pulmonary emboli post surgery was diagnosed in April 2005. Benign prostatic hypertrophy. Lumbar disc surgery. Left wrist surgery. Bilateral knee replacement surgeries. Carpal tunnel syndrome.  Exam: The patient is alert and lying comfortably on the endoscopy stretcher. Abdomen is soft and nontender to palpation. Lungs are clear to auscultation. Cardiac exam reveals a regular rhythm.  Plan: Proceed with surveillance colonoscopy

## 2016-10-20 NOTE — Op Note (Signed)
[facility] Patient Name: Ralph Blackwell Procedure Date: 10/20/2016 MRN: [id number] Attending MD: Garlan Fair , MD Date of Birth: 01-12-1941 CSN: [id number] Age: 76 Admit Type: Outpatient Procedure:                Colonoscopy Indications:              High risk colon cancer surveillance: Personal                            history of non-advanced adenoma Providers:                Garlan Fair, MD, Hilma Favors, RN, William Dalton, Technician Referring MD:              Medicines:                Propofol per Anesthesia Complications:            No immediate complications. Estimated Blood Loss:     Estimated blood loss: none. Procedure:                Pre-Anesthesia Assessment:                           - Prior to the procedure, a History and Physical                            was performed, and patient medications and                            allergies were reviewed. The patient's tolerance of                            previous anesthesia was also reviewed. The risks                            and benefits of the procedure and the sedation                            options and risks were discussed with the patient.                            All questions were answered, and informed consent                            was obtained. Prior Anticoagulants: The patient has                            taken aspirin, last dose was day of procedure. ASA                            Grade Assessment: II - A patient with mild systemic  disease. After reviewing the risks and benefits,                            the patient was deemed in satisfactory condition to                            undergo the procedure.                           After obtaining informed consent, the colonoscope                            was passed under direct vision. Throughout the                            procedure, the patient's blood  pressure, pulse, and                            oxygen saturations were monitored continuously. The                            EC-[id number] ([id number]) scope was introduced through                            the anus and advanced to the the cecum, identified                            by appendiceal orifice and ileocecal valve. The                            colonoscopy was performed without difficulty. The                            patient tolerated the procedure well. The quality                            of the bowel preparation was good. The appendiceal                            orifice and the rectum were photographed. Scope In: 7:53:27 AM Scope Out: 8:11:53 AM Total Procedure Duration: 0 hours 18 minutes 26 seconds  Findings:      The perianal and digital rectal examinations were normal.      The entire examined colon appeared normal. Left colonic diverticulosis       was present. Impression:               - The entire examined colon is normal.                           - No specimens collected. Moderate Sedation:      N/A- Per Anesthesia Care Recommendation:           - Patient has a contact number available for  emergencies. The signs and symptoms of potential                            delayed complications were discussed with the                            patient. Return to normal activities tomorrow.                            Written discharge instructions were provided to the                            patient.                           - Repeat colonoscopy is not recommended for                            surveillance.                           - Resume previous diet.                           - Continue present medications. Procedure Code(s):        --- Professional ---                           D6222, Colorectal cancer screening; colonoscopy on                            individual at high risk Diagnosis Code(s):        --- Professional ---                            Z86.010, Personal history of colonic polyps CPT copyright 2016 American Medical Association. All rights reserved. The codes documented in this report are preliminary and upon coder review may  be revised to meet current compliance requirements. [name], MD Garlan Fair, MD 10/20/2016 8:18:50 AM This report has been signed electronically. Number of Addenda: 0

## 2016-10-20 NOTE — Anesthesia Preprocedure Evaluation (Signed)
Anesthesia Evaluation  Patient identified by MRN, date of birth, ID band Patient awake    Reviewed: Allergy & Precautions, NPO status , Patient's Chart, lab work & pertinent test results  Airway Mallampati: I  TM Distance: >3 FB Neck ROM: Full    Dental   Pulmonary sleep apnea ,    Pulmonary exam normal        Cardiovascular hypertension, Pt. on medications + CAD  Normal cardiovascular exam     Neuro/Psych    GI/Hepatic   Endo/Other    Renal/GU Renal InsufficiencyRenal disease     Musculoskeletal   Abdominal   Peds  Hematology   Anesthesia Other Findings   Reproductive/Obstetrics                             Anesthesia Physical Anesthesia Plan  ASA: III  Anesthesia Plan: MAC   Post-op Pain Management:    Induction: Intravenous  Airway Management Planned: Simple Face Mask  Additional Equipment:   Intra-op Plan:   Post-operative Plan:   Informed Consent: I have reviewed the patients History and Physical, chart, labs and discussed the procedure including the risks, benefits and alternatives for the proposed anesthesia with the patient or authorized representative who has indicated his/her understanding and acceptance.     Plan Discussed with: CRNA and Surgeon  Anesthesia Plan Comments:         Anesthesia Quick Evaluation

## 2016-10-20 NOTE — Transfer of Care (Signed)
Immediate Anesthesia Transfer of Care Note  Patient: Ralph Blackwell  Procedure(s) Performed: Procedure(s): COLONOSCOPY WITH PROPOFOL (N/A)  Patient Location: PACU and Endoscopy Unit  Anesthesia Type:MAC  Level of Consciousness: awake, alert , oriented and patient cooperative  Airway & Oxygen Therapy: Patient Spontanous Breathing and Patient connected to face mask oxygen  Post-op Assessment: Report given to RN, Post -op Vital signs reviewed and stable and Patient moving all extremities  Post vital signs: Reviewed and stable  Last Vitals: There were no vitals filed for this visit.  Last Pain: There were no vitals filed for this visit.       Complications: No apparent anesthesia complications

## 2016-10-21 ENCOUNTER — Encounter (HOSPITAL_COMMUNITY): Payer: Self-pay | Admitting: Gastroenterology

## 2017-01-08 DIAGNOSIS — J209 Acute bronchitis, unspecified: Secondary | ICD-10-CM | POA: Diagnosis not present

## 2017-02-11 DIAGNOSIS — G4733 Obstructive sleep apnea (adult) (pediatric): Secondary | ICD-10-CM | POA: Diagnosis not present

## 2017-05-22 DIAGNOSIS — G4733 Obstructive sleep apnea (adult) (pediatric): Secondary | ICD-10-CM | POA: Diagnosis not present

## 2017-05-27 ENCOUNTER — Telehealth: Payer: Self-pay

## 2017-05-27 NOTE — Telephone Encounter (Signed)
Sent notes to scheduling 

## 2017-06-10 ENCOUNTER — Other Ambulatory Visit: Payer: Self-pay

## 2017-06-10 NOTE — Telephone Encounter (Signed)
Spoke with patient to set an establishing appointment. Patient stated that the majority of his care is now in [place]. He is unsure of if he will change his care to the new practice; would like to think it over. Informed the patient that his med could not be refilled as he is not technically a patient of Dr. Geraldo Pitter at new practice; patient understood.

## 2017-06-15 DIAGNOSIS — N401 Enlarged prostate with lower urinary tract symptoms: Secondary | ICD-10-CM | POA: Diagnosis not present

## 2017-06-15 DIAGNOSIS — R3915 Urgency of urination: Secondary | ICD-10-CM | POA: Diagnosis not present

## 2017-06-15 DIAGNOSIS — N5201 Erectile dysfunction due to arterial insufficiency: Secondary | ICD-10-CM | POA: Diagnosis not present

## 2017-06-23 DIAGNOSIS — I200 Unstable angina: Secondary | ICD-10-CM | POA: Diagnosis not present

## 2017-06-23 DIAGNOSIS — I10 Essential (primary) hypertension: Secondary | ICD-10-CM | POA: Diagnosis not present

## 2017-06-23 DIAGNOSIS — Z0189 Encounter for other specified special examinations: Secondary | ICD-10-CM | POA: Diagnosis not present

## 2017-06-23 DIAGNOSIS — I25110 Atherosclerotic heart disease of native coronary artery with unstable angina pectoris: Secondary | ICD-10-CM | POA: Diagnosis not present

## 2017-06-23 NOTE — H&P (Signed)
Ralph Blackwell 06/27/2017 12:30 PM Location: [facility] Patient #: [phone] DOB: 07/02/1941 Married / Language: Cleophus Molt / Race: White Male   History of Present Illness Joya Gaskins Cherokee Boccio MD; 06/27/2017 2:01 PM) Patient words: NP EVAL for altherosclerotic hd of coronary artery w/o angina.  The patient is a 76 year old male, accompanied by his wife during the visit, who presents with chest pain. Patient referred for evaluation of chest pain by Josetta Huddle, MD  76 year old Caucasian male with controlled hypertension, controlled hyperlipidemia, OSA, nonobstructive coronary artery disease on catheter in 2005 (30-40% LAD, 40-50% D1, 20% LPDA).  For the last few weeks, patient has experienced chest tightness after walking in the driveway, and while exercising on stationary bicycle. He is also noticed similar episodes of pain. Waking up in the morning. Episodes last typically for 2-3 minutes and resolve on their own. He has not used nitroglycerin.  His son, who is 31 years old, recently had a heart attack after completing his exercise treadmill stress test that led to cardiac arrest. He eventually underwent bypass surgery and is now back to doing his baseline physical activity which includes participating in [place].  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      Problem List/Past Medical Frances Furbish Johnson; 06/27/17 12:56 PM) Benign essential hypertension (I10)  Hyperlipidemia, mixed (E78.2)  Acid reflux (K21.9)  History of gout (Z87.39)   Allergies Frances Furbish Johnson; 06/27/17 12:56 PM) No Known Drug Allergies [2017/06/27]:  Family History Vernell Leep, MD; 06/27/2017 1:16 PM) Mother  Deceased. at age 72; heart issues and abd cancer Father  Deceased. at age 65; MI, 5bypasses, stroke, renal failure Brother 1  older; no heart issues, parkinsons Son 1  72 y/o had heart attack, Cardiac arrest post stress tests, had  bypass Very active, now does [employer].  Social History Frances Furbish Johnson; 06-27-17 12:58 PM) Current tobacco use  Never smoker. Non Drinker/No Alcohol Use  Marital status  Married. Living Situation  Lives with spouse. Number of Children  3.  Past Surgical History Cheri Kearns; Jun 27, 2017 12:59 PM) Back Surgery [1998]: Knee Replacement, Total [2007]: Right.  Medication History Frances Furbish Johnson; 06-27-17 1:03 PM) Rosuvastatin Calcium (40MG Tablet, 1/2 Oral daily) Active. Losartan Potassium (50MG Tablet, 1 Oral daily) Active. Finasteride (5MG Tablet, 1 Oral daily) Active. Allopurinol (300MG Tablet, 1/2 Oral daily) Active. Aspirin (81MG Capsule, 2 Oral daily) Active. Vitamin D3 (50000UNIT Capsule, 1 Oral DAily) Active. Medications Reconciled (meds present)  Diagnostic Studies History Frances Furbish Johnson; Jun 27, 2017 1:00 PM) Coronary Angiogram [2003]: cath Colonoscopy [2018]: Normal.    Review of Systems Joya Gaskins Sonnie Pawloski MD; 06-27-2017 1:58 PM) General Not Present- Appetite Loss and Weight Gain. Respiratory Not Present- Chronic Cough and Wakes up from Sleep Wheezing or Short of Breath. Cardiovascular Present- Chest Pain and Hypertension (Controlled). Not Present- Difficulty Breathing Lying Down, Difficulty Breathing On Exertion, Edema and Orthopnea. Gastrointestinal Not Present- Black, Tarry Stool and Difficulty Swallowing. Musculoskeletal Not Present- Decreased Range of Motion and Muscle Atrophy. Neurological Not Present- Attention Deficit. Psychiatric Not Present- Personality Changes and Suicidal Ideation. Endocrine Not Present- Cold Intolerance and Heat Intolerance. Hematology Not Present- Abnormal Bleeding. All other systems negative  Vitals Frances Furbish Johnson; 06/27/17 1:04 PM) Jun 27, 2017 12:54 PM Weight: 227.38 lb Height: 69in Body Surface Area: 2.18 m Body Mass Index: 33.58 kg/m  Pulse: 57 (Regular)  P.OX: 96% (Room air) BP: 124/70 (Sitting,  Left Arm, Standard)       Physical Exam Joya Gaskins Jamylah Marinaccio MD; Jun 27, 2017 1:58 PM)  General Mental Status-Alert. General Appearance-Cooperative and Appears stated age. Build & Nutrition-Moderately built.  Head and Neck Thyroid Gland Characteristics - normal size and consistency and no palpable nodules.  Chest and Lung Exam Chest and lung exam reveals -quiet, even and easy respiratory effort with no use of accessory muscles, non-tender and on auscultation, normal breath sounds, no adventitious sounds.  Cardiovascular Cardiovascular examination reveals -normal heart sounds, regular rate and rhythm with no murmurs, carotid auscultation reveals no bruits, abdominal aorta auscultation reveals no bruits and no prominent pulsation and femoral artery auscultation bilaterally reveals normal pulses, no bruits, no thrills.  Abdomen Palpation/Percussion Palpation and Percussion of the abdomen reveal - Non Tender and No hepatosplenomegaly.  Peripheral Vascular Lower Extremity Palpation - Dorsalis pedis pulse - Bilateral - 2+. Posterior tibial pulse - Bilateral - 2+. Carotid arteries - Bilateral-No Carotid bruit.  Neurologic Neurologic evaluation reveals -alert and oriented x 3 with no impairment of recent or remote memory. Motor-Grossly intact without any focal deficits.  Musculoskeletal Global Assessment Left Lower Extremity - no deformities, masses or tenderness, no known fractures. Right Lower Extremity - no deformities, masses or tenderness, no known fractures.    Assessment & Plan Joya Gaskins Holbert Caples MD; 06/23/2017 2:00 PM) Coronary artery disease with unstable angina pectoris, unspecified vessel or lesion type, unspecified whether native or transplanted heart (I25.110) Story: Cardiac catheterization 05/06/2004: LM: Normal LAD: Mid 30-40%. Diag 1 40-50% LCx: Dominant. Prox mild irregularities. LCx/PDA 20% RCA: Nondominant Unstable angina (I20.0) Current  Plans Started Isosorbide Mononitrate ER 30MG, 1 (one) Tablet once daily, #30, 30 days starting 06/23/2017, Ref. x1. Started Nitroglycerin 0.4MG, 1 (one) Tablet as needed, #25, 25 days starting 06/23/2017, Ref. x1. METABOLIC PANEL, BASIC (67893) CBC & PLATELETS (AUTO) (85027) PT (PROTHROMBIN TIME) (81017) Benign essential hypertension (I10) Story: EKG 06/23/2017: Sinus bradycardia at 52 bpm. Normal axis. Borderline first-degree AV block. Low voltage precordial leads. Current Plans Complete electrocardiogram (93000) Laboratory examination (Z01.89) Story: Labs 07/16/2016: Glucose 78. BUN/creatinine 23/07/14/06. EGFR 67. Sodium 141, potassium 4.7. H/H 14.6/44.4. MCV 91. Platelet's 134. Cholesterol 146, HDL 54, triglycerides 80, LDL 76  Note:Recommendations:  76 year old Caucasian male with controlled hypertension, controlled hyperlipidemia, OSA, nonobstructive coronary artery disease on catheter in 2005 (30-40% LAD, 40-50% D1, 20% LPDA).  Episodes of chest pain concerning for angina. While they are produced by exertion, and also seemed to occur at rest racing the impression of unstable angina. Patient has known nonobstructive coronary artery disease from prior catheterization in 2005. Given his son's experience with exercise treadmill stress test, he is very of performing a stress test. After discussing the risks and benefits, we decided to proceed with coronary angiogram, possible FFR and angioplasty as necessary. I started him on 30 mg isosorbide mononitrate and sublingual nitroglycerin for as needed use. His resting heart rate is in 50s and blood pressure is well controlled, thus unable to start on any other antianginal therapy.  Schedule for cardiac catheterization, and possible angioplasty. We discussed regarding risks, benefits, alternatives to this including stress testing, CTA and continued medical therapy. Patient wants to proceed. Understands <1-2% risk of death, stroke, MI, urgent  CABG, bleeding, infection, renal failure but not limited to these. Video recording of the procedure shown to the patient. This was a greater than 40 minute office visit with greater than 50% of the time spent with face-to-face encounter with patient and evaluation of complex medical issues, review of external records and coordination of care.  Cc Josetta Huddle, MD  Signed electronically by Vernell Leep, MD (  06/23/2017 2:01 PM)

## 2017-06-24 NOTE — Progress Notes (Signed)
Labs 06/23/2017: H/H 13.8/42.5.  MCV 93.  Platelets 150 INR 1.0 BUN/crit 17/0.9.  EGFR 76.  Sodium 144, potassium 4.6.

## 2017-06-25 NOTE — Progress Notes (Signed)
Labs 06/23/2017: H/H 13.8/42.5.  MCV 93.  Platelets 150 INR 1.0 BUN/crit 17/0.9.  EGFR 76.  Sodium 144, potassium 4.6.

## 2017-06-25 NOTE — H&P (Signed)
Ralph Blackwell Jun 25, 2017 12:30 PM Location: [facility] Patient #: [id number] DOB: [birth year] Married / Language: Ralph Blackwell / Race: White Male   History of Present Illness Ralph Blackwell Ralph Gentzler MD; 2017/06/25 2:01 PM) Patient words: NP EVAL for altherosclerotic hd of coronary artery w/o angina.  The patient is a 76 year old male, accompanied by his wife during the visit, who presents with chest pain. Patient referred for evaluation of chest pain by Ralph Huddle, MD  75 year old Caucasian male with controlled hypertension, controlled hyperlipidemia, OSA, nonobstructive coronary artery disease on catheter in 2005 (30-40% LAD, 40-50% D1, 20% LPDA).  For the last few weeks, patient has experienced chest tightness after walking in the driveway, and while exercising on stationary bicycle. He is also noticed similar episodes of pain. Waking up in the morning. Episodes last typically for 2-3 minutes and resolve on their own. He has not used nitroglycerin.  His son, who is 34 years old, recently had a heart attack after completing his exercise treadmill stress test that led to cardiac arrest. He eventually underwent bypass surgery and is now back to doing his baseline physical activity which includes participating in [place].  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      Problem List/Past Medical Ralph Blackwell; 2017/06/25 12:56 PM) Benign essential hypertension (I10)  Hyperlipidemia, mixed (E78.2)  Acid reflux (K21.9)  History of gout (Z87.39)   Allergies Ralph Blackwell; 06-25-2017 12:56 PM) No Known Drug Allergies [06-25-17]:  Family History Ralph Leep, MD; 25-Jun-2017 1:16 PM) Mother  Deceased. at age 71; heart issues and abd cancer Father  Deceased. at age 58; MI, 5bypasses, stroke, renal failure Brother 1  older; no heart issues, parkinsons Son 1  68 y/o had heart attack, Cardiac arrest post stress tests, had  bypass Very active, now does [employer].  Social History Ralph Blackwell; June 25, 2017 12:58 PM) Current tobacco use  Never smoker. Non Drinker/No Alcohol Use  Marital status  Married. Living Situation  Lives with spouse. Number of Children  3.  Past Surgical History Ralph Blackwell; June 25, 2017 12:59 PM) Back Surgery [1998]: Knee Replacement, Total [2007]: Right.  Medication History Ralph Blackwell; 2017-06-25 1:03 PM) Rosuvastatin Calcium (40MG Tablet, 1/2 Oral daily) Active. Losartan Potassium (50MG Tablet, 1 Oral daily) Active. Finasteride (5MG Tablet, 1 Oral daily) Active. Allopurinol (300MG Tablet, 1/2 Oral daily) Active. Aspirin (81MG Capsule, 2 Oral daily) Active. Vitamin D3 (50000UNIT Capsule, 1 Oral DAily) Active. Medications Reconciled (meds present)  Diagnostic Studies History Ralph Blackwell; Jun 25, 2017 1:00 PM) Coronary Angiogram [2003]: cath Colonoscopy [2018]: Normal.    Review of Systems Ralph Blackwell Ralph Loudermilk MD; 06-25-17 1:58 PM) General Not Present- Appetite Loss and Weight Gain. Respiratory Not Present- Chronic Cough and Wakes up from Sleep Wheezing or Short of Breath. Cardiovascular Present- Chest Pain and Hypertension (Controlled). Not Present- Difficulty Breathing Lying Down, Difficulty Breathing On Exertion, Edema and Orthopnea. Gastrointestinal Not Present- Black, Tarry Stool and Difficulty Swallowing. Musculoskeletal Not Present- Decreased Range of Motion and Muscle Atrophy. Neurological Not Present- Attention Deficit. Psychiatric Not Present- Personality Changes and Suicidal Ideation. Endocrine Not Present- Cold Intolerance and Heat Intolerance. Hematology Not Present- Abnormal Bleeding. All other systems negative  Vitals Ralph Blackwell; 06-25-17 1:04 PM) 06-25-2017 12:54 PM Weight: 227.38 lb Height: 69in Body Surface Area: 2.18 m Body Mass Index: 33.58 kg/m  Pulse: 57 (Regular)  P.OX: 96% (Room air) BP: 124/70 (Sitting,  Left Arm, Standard)       Physical Exam Ralph Blackwell Ralph Klindt MD; 06-25-2017 1:58 PM)  General Mental Status-Alert. General Appearance-Cooperative and Appears stated age. Build & Nutrition-Moderately built.  Head and Neck Thyroid Gland Characteristics - normal size and consistency and no palpable nodules.  Chest and Lung Exam Chest and lung exam reveals -quiet, even and easy respiratory effort with no use of accessory muscles, non-tender and on auscultation, normal breath sounds, no adventitious sounds.  Cardiovascular Cardiovascular examination reveals -normal heart sounds, regular rate and rhythm with no murmurs, carotid auscultation reveals no bruits, abdominal aorta auscultation reveals no bruits and no prominent pulsation and femoral artery auscultation bilaterally reveals normal pulses, no bruits, no thrills.  Abdomen Palpation/Percussion Palpation and Percussion of the abdomen reveal - Non Tender and No hepatosplenomegaly.  Peripheral Vascular Lower Extremity Palpation - Dorsalis pedis pulse - Bilateral - 2+. Posterior tibial pulse - Bilateral - 2+. Carotid arteries - Bilateral-No Carotid bruit.  Neurologic Neurologic evaluation reveals -alert and oriented x 3 with no impairment of recent or remote memory. Motor-Grossly intact without any focal deficits.  Musculoskeletal Global Assessment Left Lower Extremity - no deformities, masses or tenderness, no known fractures. Right Lower Extremity - no deformities, masses or tenderness, no known fractures.    Assessment & Plan Ralph Blackwell Ralph Bilski MD; 06/23/2017 2:00 PM) Coronary artery disease with unstable angina pectoris, unspecified vessel or lesion type, unspecified whether native or transplanted heart (I25.110) Story: Cardiac catheterization 05/06/2004: LM: Normal LAD: Mid 30-40%. Diag 1 40-50% LCx: Dominant. Prox mild irregularities. LCx/PDA 20% RCA: Nondominant Unstable angina (I20.0) Current  Plans Started Isosorbide Mononitrate ER 30MG, 1 (one) Tablet once daily, #30, 30 days starting 06/23/2017, Ref. x1. Started Nitroglycerin 0.4MG, 1 (one) Tablet as needed, #25, 25 days starting 06/23/2017, Ref. x1. METABOLIC PANEL, BASIC (76160) CBC & PLATELETS (AUTO) (85027) PT (PROTHROMBIN TIME) (73710) Benign essential hypertension (I10) Story: EKG 06/23/2017: Sinus bradycardia at 52 bpm. Normal axis. Borderline first-degree AV block. Low voltage precordial leads. Current Plans Complete electrocardiogram (93000) Laboratory examination (Z01.89) Story: Labs 07/16/2016: Glucose 78. BUN/creatinine 23/07/14/06. EGFR 67. Sodium 141, potassium 4.7. H/H 14.6/44.4. MCV 91. Platelet's 134. Cholesterol 146, HDL 54, triglycerides 80, LDL 76  Note:Recommendations:  76 year old Caucasian male with controlled hypertension, controlled hyperlipidemia, OSA, nonobstructive coronary artery disease on catheter in 2005 (30-40% LAD, 40-50% D1, 20% LPDA).  Episodes of chest pain concerning for angina. While they are produced by exertion, and also seemed to occur at rest racing the impression of unstable angina. Patient has known nonobstructive coronary artery disease from prior catheterization in 2005. Given his son's experience with exercise treadmill stress test, he is very of performing a stress test. After discussing the risks and benefits, we decided to proceed with coronary angiogram, possible FFR and angioplasty as necessary. I started him on 30 mg isosorbide mononitrate and sublingual nitroglycerin for as needed use. His resting heart rate is in 50s and blood pressure is well controlled, thus unable to start on any other antianginal therapy.  Schedule for cardiac catheterization, and possible angioplasty. We discussed regarding risks, benefits, alternatives to this including stress testing, CTA and continued medical therapy. Patient wants to proceed. Understands <1-2% risk of death, stroke, MI, urgent  CABG, bleeding, infection, renal failure but not limited to these. Video recording of the procedure shown to the patient. This was a greater than 40 minute office visit with greater than 50% of the time spent with face-to-face encounter with patient and evaluation of complex medical issues, review of external records and coordination of care.  Cc Ralph Huddle, MD  Signed electronically by Ralph Leep, MD (  06/23/2017 2:01 PM)

## 2017-06-30 ENCOUNTER — Encounter (HOSPITAL_COMMUNITY)
Admission: RE | Disposition: A | Discharge status: Home / Self Care | Payer: Self-pay | Source: Ambulatory Visit | Attending: Cardiology

## 2017-06-30 ENCOUNTER — Ambulatory Visit (HOSPITAL_COMMUNITY)
Admission: RE | Admit: 2017-06-30 | Discharge: 2017-06-30 | Disposition: A | Discharge status: Home / Self Care | Payer: PPO | Source: Ambulatory Visit | Attending: Cardiology | Admitting: Cardiology

## 2017-06-30 ENCOUNTER — Encounter (HOSPITAL_COMMUNITY): Payer: Self-pay | Admitting: Cardiology

## 2017-06-30 DIAGNOSIS — E785 Hyperlipidemia, unspecified: Secondary | ICD-10-CM | POA: Diagnosis not present

## 2017-06-30 DIAGNOSIS — Z96651 Presence of right artificial knee joint: Secondary | ICD-10-CM | POA: Diagnosis not present

## 2017-06-30 DIAGNOSIS — Z79899 Other long term (current) drug therapy: Secondary | ICD-10-CM | POA: Insufficient documentation

## 2017-06-30 DIAGNOSIS — Z8674 Personal history of sudden cardiac arrest: Secondary | ICD-10-CM | POA: Diagnosis not present

## 2017-06-30 DIAGNOSIS — E782 Mixed hyperlipidemia: Secondary | ICD-10-CM | POA: Insufficient documentation

## 2017-06-30 DIAGNOSIS — Z7982 Long term (current) use of aspirin: Secondary | ICD-10-CM | POA: Diagnosis not present

## 2017-06-30 DIAGNOSIS — I10 Essential (primary) hypertension: Secondary | ICD-10-CM | POA: Diagnosis not present

## 2017-06-30 DIAGNOSIS — G4733 Obstructive sleep apnea (adult) (pediatric): Secondary | ICD-10-CM | POA: Insufficient documentation

## 2017-06-30 DIAGNOSIS — I200 Unstable angina: Secondary | ICD-10-CM | POA: Diagnosis present

## 2017-06-30 DIAGNOSIS — I25110 Atherosclerotic heart disease of native coronary artery with unstable angina pectoris: Secondary | ICD-10-CM | POA: Diagnosis not present

## 2017-06-30 HISTORY — PX: LEFT HEART CATH AND CORONARY ANGIOGRAPHY: CATH118249

## 2017-06-30 SURGERY — LEFT HEART CATH AND CORONARY ANGIOGRAPHY
Anesthesia: LOCAL

## 2017-06-30 MED ORDER — FENTANYL CITRATE (PF) 100 MCG/2ML IJ SOLN
INTRAMUSCULAR | Status: DC | PRN
  Administered 2017-06-30: 25 ug via INTRAVENOUS

## 2017-06-30 MED ORDER — SODIUM CHLORIDE 0.9% FLUSH: 3.0000 mL | INTRAVENOUS | Status: DC | PRN

## 2017-06-30 MED ORDER — NITROGLYCERIN 1 MG/10 ML FOR IR/CATH LAB
INTRA_ARTERIAL | Status: AC
  Filled 2017-06-30: qty 10

## 2017-06-30 MED ORDER — HEPARIN (PORCINE) IN NACL 2-0.9 UNIT/ML-% IJ SOLN
INTRAMUSCULAR | Status: AC
  Filled 2017-06-30: qty 1000

## 2017-06-30 MED ORDER — SODIUM CHLORIDE 0.9% FLUSH: 3.0000 mL | Freq: Two times a day (BID) | INTRAVENOUS | Status: DC

## 2017-06-30 MED ORDER — IOPAMIDOL (ISOVUE-370) INJECTION 76%
INTRAVENOUS | Status: AC
  Filled 2017-06-30: qty 100

## 2017-06-30 MED ORDER — HEPARIN (PORCINE) IN NACL 2-0.9 UNIT/ML-% IJ SOLN
INTRAMUSCULAR | Status: AC | PRN
  Administered 2017-06-30: 1000 mL

## 2017-06-30 MED ORDER — SODIUM CHLORIDE 0.9 % IV SOLN: 250.0000 mL | INTRAVENOUS | Status: DC | PRN

## 2017-06-30 MED ORDER — MIDAZOLAM HCL 2 MG/2ML IJ SOLN
INTRAMUSCULAR | Status: AC
  Filled 2017-06-30: qty 2

## 2017-06-30 MED ORDER — ASPIRIN 81 MG PO CHEW: 81.0000 mg | CHEWABLE_TABLET | ORAL | Status: DC

## 2017-06-30 MED ORDER — FENTANYL CITRATE (PF) 100 MCG/2ML IJ SOLN
INTRAMUSCULAR | Status: AC
  Filled 2017-06-30: qty 2

## 2017-06-30 MED ORDER — VERAPAMIL HCL 2.5 MG/ML IV SOLN
INTRAVENOUS | Status: AC
  Filled 2017-06-30: qty 2

## 2017-06-30 MED ORDER — SODIUM CHLORIDE 0.9 % IV SOLN
INTRAVENOUS | Status: DC
  Administered 2017-06-30: 06:00:00 via INTRAVENOUS

## 2017-06-30 MED ORDER — LIDOCAINE HCL (PF) 1 % IJ SOLN
INTRAMUSCULAR | Status: DC | PRN
  Administered 2017-06-30: 2 mL via INTRADERMAL

## 2017-06-30 MED ORDER — HEPARIN SODIUM (PORCINE) 1000 UNIT/ML IJ SOLN
INTRAMUSCULAR | Status: AC
  Filled 2017-06-30: qty 1

## 2017-06-30 MED ORDER — VERAPAMIL HCL 2.5 MG/ML IV SOLN
INTRA_ARTERIAL | Status: DC | PRN
  Administered 2017-06-30: 4 mL via INTRA_ARTERIAL

## 2017-06-30 MED ORDER — MIDAZOLAM HCL 2 MG/2ML IJ SOLN
INTRAMUSCULAR | Status: DC | PRN
  Administered 2017-06-30: 1 mg via INTRAVENOUS

## 2017-06-30 MED ORDER — ACETAMINOPHEN 325 MG PO TABS: 650.0000 mg | ORAL_TABLET | ORAL | Status: DC | PRN

## 2017-06-30 MED ORDER — SODIUM CHLORIDE 0.9 % IV SOLN
INTRAVENOUS | Status: AC | PRN
  Administered 2017-06-30: 250 mL via INTRAVENOUS

## 2017-06-30 MED ORDER — IOPAMIDOL (ISOVUE-370) INJECTION 76%
INTRAVENOUS | Status: DC | PRN
  Administered 2017-06-30: 55 mL via INTRA_ARTERIAL

## 2017-06-30 MED ORDER — SODIUM CHLORIDE 0.9 % IV SOLN: INTRAVENOUS | Status: AC

## 2017-06-30 MED ORDER — OMEPRAZOLE 40 MG PO CPDR: 40.0000 mg | DELAYED_RELEASE_CAPSULE | Freq: Every day | ORAL | 3 refills | Status: DC

## 2017-06-30 MED ORDER — LIDOCAINE HCL (PF) 1 % IJ SOLN
INTRAMUSCULAR | Status: AC
  Filled 2017-06-30: qty 30

## 2017-06-30 MED ORDER — ONDANSETRON HCL 4 MG/2ML IJ SOLN: 4.0000 mg | Freq: Four times a day (QID) | INTRAMUSCULAR | Status: DC | PRN

## 2017-06-30 MED ORDER — HEPARIN SODIUM (PORCINE) 1000 UNIT/ML IJ SOLN
INTRAMUSCULAR | Status: DC | PRN
  Administered 2017-06-30: 6000 [IU] via INTRAVENOUS

## 2017-06-30 SURGICAL SUPPLY — 9 items
CATH 5FR JL3.5 JR4 ANG PIG MP (CATHETERS) ×2 IMPLANT
DEVICE RAD COMP TR BAND LRG (VASCULAR PRODUCTS) ×2 IMPLANT
GLIDESHEATH SLEND A-KIT 6F 22G (SHEATH) ×2 IMPLANT
GUIDEWIRE INQWIRE 1.5J.035X260 (WIRE) ×1 IMPLANT
INQWIRE 1.5J .035X260CM (WIRE) ×2
KIT HEART LEFT (KITS) ×2 IMPLANT
PACK CARDIAC CATHETERIZATION (CUSTOM PROCEDURE TRAY) ×2 IMPLANT
TRANSDUCER W/STOPCOCK (MISCELLANEOUS) ×2 IMPLANT
TUBING CIL FLEX 10 FLL-RA (TUBING) ×2 IMPLANT

## 2017-06-30 NOTE — Interval H&P Note (Signed)
History and Physical Interval Note:  06/30/2017 7:36 AM  Ralph Blackwell  has presented today for surgery, with the diagnosis of cp - hp  The various methods of treatment have been discussed with the patient and family. After consideration of risks, benefits and other options for treatment, the patient has consented to  Procedure(s): LEFT HEART CATH AND CORONARY ANGIOGRAPHY (N/A) as a surgical intervention .  The patient's history has been reviewed, patient examined, no change in status, stable for surgery.  I have reviewed the patient's chart and labs.  Questions were answered to the patient's satisfaction.     Indication:  1. Suspected CAD (No Prior PCI, No Prior CABG, and No Prior Angiogram Showing >= 50% Angiographic Stenosis) 2. No Prior Noninvasive Testing 3. Symptomatic 4. High Pretest Probability A (7) Indication: 10; Score 7      [place]

## 2017-06-30 NOTE — Discharge Instructions (Signed)

## 2017-07-10 DIAGNOSIS — J069 Acute upper respiratory infection, unspecified: Secondary | ICD-10-CM | POA: Diagnosis not present

## 2017-07-16 DIAGNOSIS — I10 Essential (primary) hypertension: Secondary | ICD-10-CM | POA: Diagnosis not present

## 2017-07-16 DIAGNOSIS — K219 Gastro-esophageal reflux disease without esophagitis: Secondary | ICD-10-CM | POA: Diagnosis not present

## 2017-07-16 DIAGNOSIS — I2510 Atherosclerotic heart disease of native coronary artery without angina pectoris: Secondary | ICD-10-CM | POA: Diagnosis not present

## 2017-07-16 DIAGNOSIS — Z0189 Encounter for other specified special examinations: Secondary | ICD-10-CM | POA: Diagnosis not present

## 2017-09-01 DIAGNOSIS — G4733 Obstructive sleep apnea (adult) (pediatric): Secondary | ICD-10-CM | POA: Diagnosis not present

## 2017-09-18 DIAGNOSIS — Z0001 Encounter for general adult medical examination with abnormal findings: Secondary | ICD-10-CM | POA: Diagnosis not present

## 2017-09-18 DIAGNOSIS — E7800 Pure hypercholesterolemia, unspecified: Secondary | ICD-10-CM | POA: Diagnosis not present

## 2017-09-18 DIAGNOSIS — J309 Allergic rhinitis, unspecified: Secondary | ICD-10-CM | POA: Diagnosis not present

## 2017-09-18 DIAGNOSIS — Z86010 Personal history of colonic polyps: Secondary | ICD-10-CM | POA: Diagnosis not present

## 2017-09-18 DIAGNOSIS — I2510 Atherosclerotic heart disease of native coronary artery without angina pectoris: Secondary | ICD-10-CM | POA: Diagnosis not present

## 2017-09-18 DIAGNOSIS — Z79899 Other long term (current) drug therapy: Secondary | ICD-10-CM | POA: Diagnosis not present

## 2017-09-18 DIAGNOSIS — L820 Inflamed seborrheic keratosis: Secondary | ICD-10-CM | POA: Diagnosis not present

## 2017-09-18 DIAGNOSIS — Z1389 Encounter for screening for other disorder: Secondary | ICD-10-CM | POA: Diagnosis not present

## 2017-09-18 DIAGNOSIS — E559 Vitamin D deficiency, unspecified: Secondary | ICD-10-CM | POA: Diagnosis not present

## 2017-09-18 DIAGNOSIS — G4733 Obstructive sleep apnea (adult) (pediatric): Secondary | ICD-10-CM | POA: Diagnosis not present

## 2017-09-18 DIAGNOSIS — I10 Essential (primary) hypertension: Secondary | ICD-10-CM | POA: Diagnosis not present

## 2017-09-18 DIAGNOSIS — N5201 Erectile dysfunction due to arterial insufficiency: Secondary | ICD-10-CM | POA: Diagnosis not present

## 2017-12-01 DIAGNOSIS — G4733 Obstructive sleep apnea (adult) (pediatric): Secondary | ICD-10-CM | POA: Diagnosis not present

## 2018-02-01 DIAGNOSIS — Z0189 Encounter for other specified special examinations: Secondary | ICD-10-CM | POA: Diagnosis not present

## 2018-02-01 DIAGNOSIS — I10 Essential (primary) hypertension: Secondary | ICD-10-CM | POA: Diagnosis not present

## 2018-02-01 DIAGNOSIS — I2510 Atherosclerotic heart disease of native coronary artery without angina pectoris: Secondary | ICD-10-CM | POA: Diagnosis not present

## 2018-02-01 DIAGNOSIS — K219 Gastro-esophageal reflux disease without esophagitis: Secondary | ICD-10-CM | POA: Diagnosis not present

## 2018-03-19 DIAGNOSIS — J40 Bronchitis, not specified as acute or chronic: Secondary | ICD-10-CM | POA: Diagnosis not present

## 2018-03-19 DIAGNOSIS — I10 Essential (primary) hypertension: Secondary | ICD-10-CM | POA: Diagnosis not present

## 2018-03-19 DIAGNOSIS — N400 Enlarged prostate without lower urinary tract symptoms: Secondary | ICD-10-CM | POA: Diagnosis not present

## 2018-03-19 DIAGNOSIS — I2510 Atherosclerotic heart disease of native coronary artery without angina pectoris: Secondary | ICD-10-CM | POA: Diagnosis not present

## 2018-03-19 DIAGNOSIS — M179 Osteoarthritis of knee, unspecified: Secondary | ICD-10-CM | POA: Diagnosis not present

## 2018-03-19 DIAGNOSIS — E7800 Pure hypercholesterolemia, unspecified: Secondary | ICD-10-CM | POA: Diagnosis not present

## 2018-03-23 DIAGNOSIS — G4733 Obstructive sleep apnea (adult) (pediatric): Secondary | ICD-10-CM | POA: Diagnosis not present

## 2018-04-08 DIAGNOSIS — T732XXA Exhaustion due to exposure, initial encounter: Secondary | ICD-10-CM | POA: Diagnosis not present

## 2018-04-08 DIAGNOSIS — Z20828 Contact with and (suspected) exposure to other viral communicable diseases: Secondary | ICD-10-CM | POA: Diagnosis not present

## 2018-06-21 DIAGNOSIS — M1990 Unspecified osteoarthritis, unspecified site: Secondary | ICD-10-CM | POA: Diagnosis not present

## 2018-06-21 DIAGNOSIS — G4733 Obstructive sleep apnea (adult) (pediatric): Secondary | ICD-10-CM | POA: Diagnosis not present

## 2018-06-28 DIAGNOSIS — I2510 Atherosclerotic heart disease of native coronary artery without angina pectoris: Secondary | ICD-10-CM | POA: Diagnosis not present

## 2018-06-28 DIAGNOSIS — E7800 Pure hypercholesterolemia, unspecified: Secondary | ICD-10-CM | POA: Diagnosis not present

## 2018-06-28 DIAGNOSIS — I10 Essential (primary) hypertension: Secondary | ICD-10-CM | POA: Diagnosis not present

## 2018-06-28 DIAGNOSIS — J209 Acute bronchitis, unspecified: Secondary | ICD-10-CM | POA: Diagnosis not present

## 2018-06-28 DIAGNOSIS — N400 Enlarged prostate without lower urinary tract symptoms: Secondary | ICD-10-CM | POA: Diagnosis not present

## 2018-06-28 DIAGNOSIS — M179 Osteoarthritis of knee, unspecified: Secondary | ICD-10-CM | POA: Diagnosis not present

## 2018-07-10 DIAGNOSIS — R05 Cough: Secondary | ICD-10-CM | POA: Diagnosis not present

## 2018-07-10 DIAGNOSIS — J209 Acute bronchitis, unspecified: Secondary | ICD-10-CM | POA: Diagnosis not present

## 2018-07-22 DIAGNOSIS — M1990 Unspecified osteoarthritis, unspecified site: Secondary | ICD-10-CM | POA: Diagnosis not present

## 2018-07-22 DIAGNOSIS — G4733 Obstructive sleep apnea (adult) (pediatric): Secondary | ICD-10-CM | POA: Diagnosis not present

## 2018-08-04 DIAGNOSIS — G4733 Obstructive sleep apnea (adult) (pediatric): Secondary | ICD-10-CM | POA: Diagnosis not present

## 2018-08-10 IMAGING — RF DG ESOPHAGUS
6 series · 14 of 16 positions shown · non-contrast
Comparison: 01/25/2013

CLINICAL DATA: Dysphasia.

EXAM:
ESOPHOGRAM / BARIUM SWALLOW / BARIUM TABLET STUDY
TECHNIQUE: Combined double contrast and single contrast examination performed
using effervescent crystals, thick barium liquid, and thin barium
liquid. The patient was observed with fluoroscopy swallowing a 13 mm
barium sulphate tablet.
FLUOROSCOPY TIME:  Fluoroscopy Time:  54 seconds
Radiation Exposure Index (if provided by the fluoroscopic device):
77

[Series 1: one shot · 4 of 5 slices shown (1 of 3)]
[im 1/5]
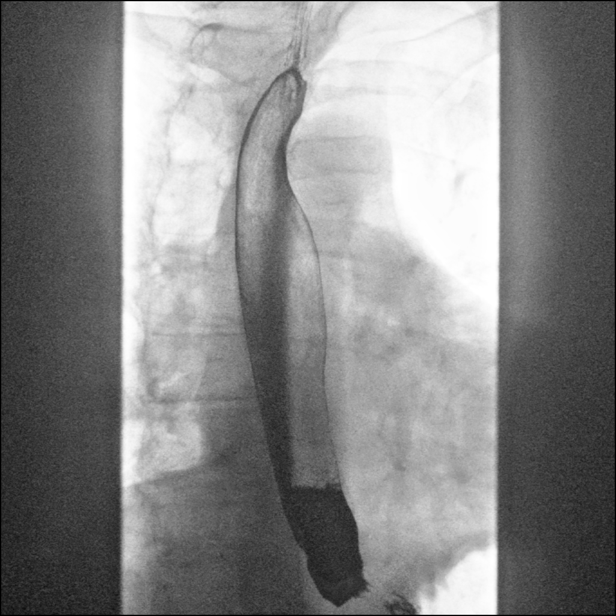
[im 2/5]
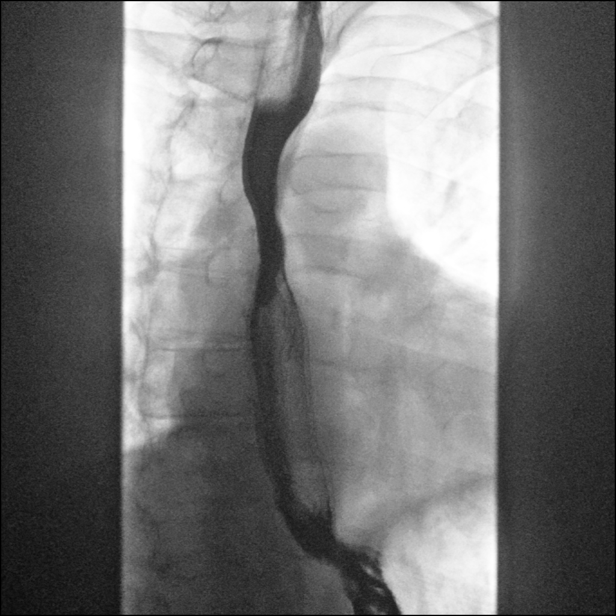
[im 3/5]
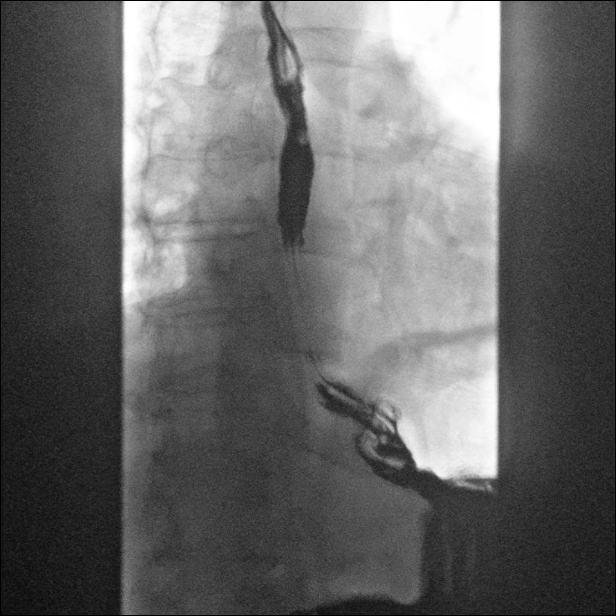
[im 5/5]
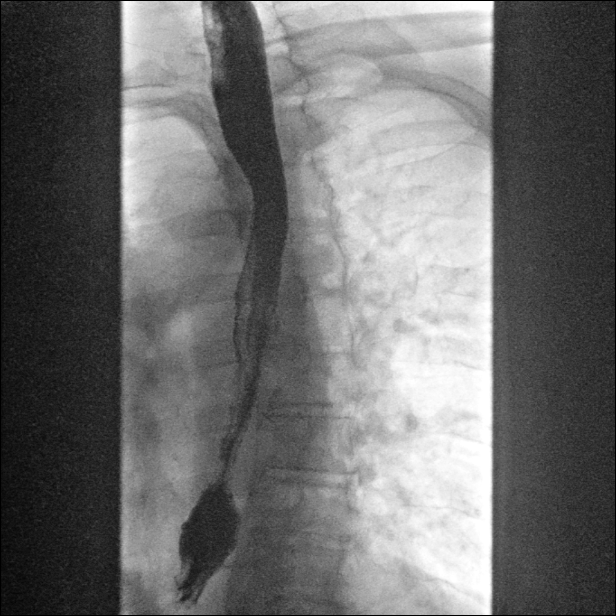

[Series 2: sequence · 1 of 1 slices shown (1 of 3)]
[im 1/1]
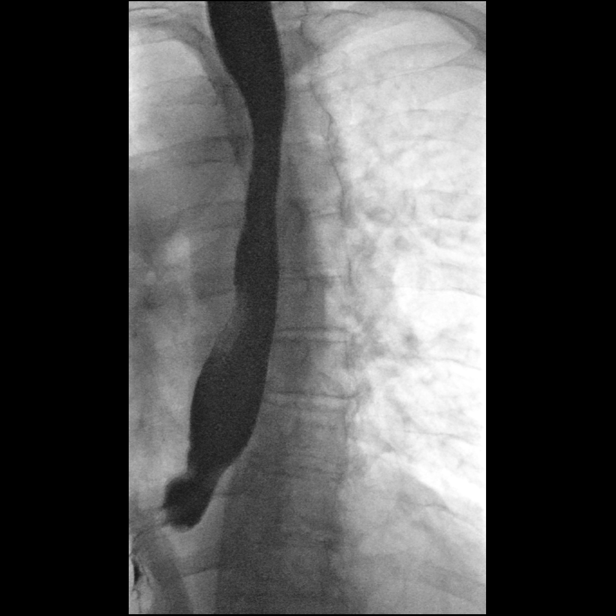

[Series 3: one shot · 3 of 3 slices shown (2 of 3)]
[im 1/3]
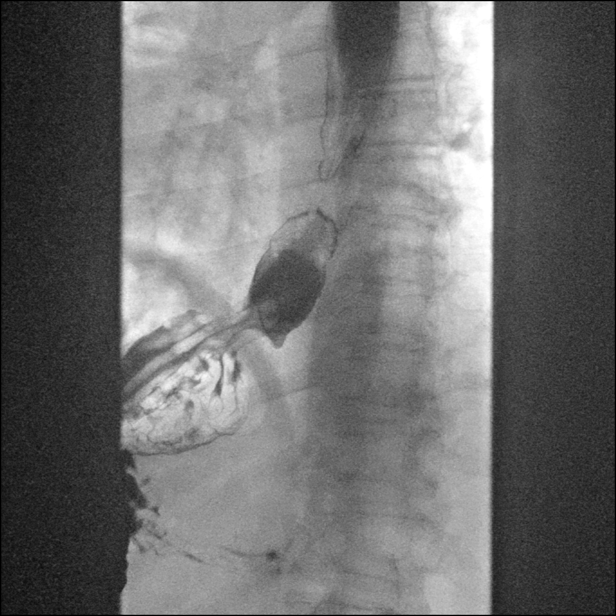
[im 2/3]
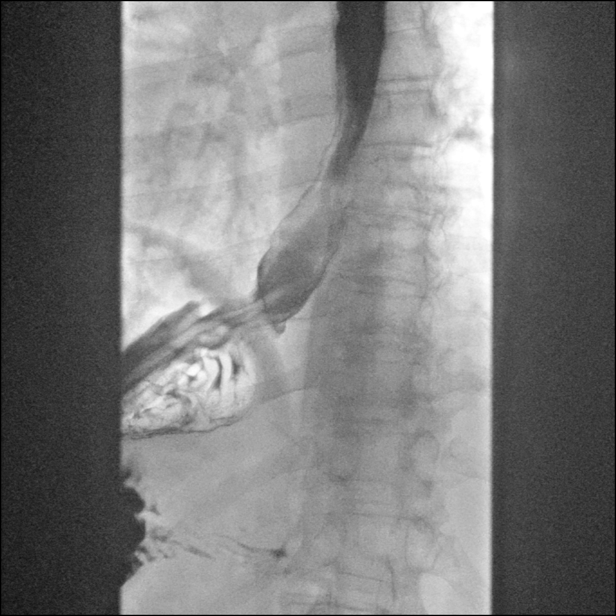
[im 3/3]
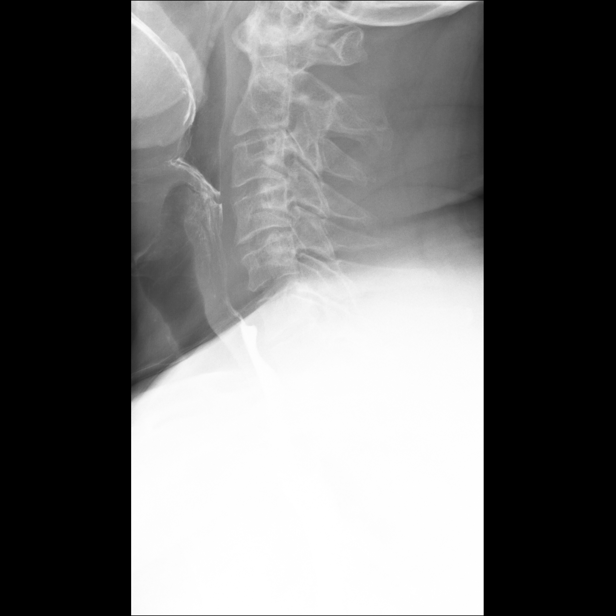

[Series 4: sequence · 2 of 14 frames shown (2 of 3)]
[frame 3/14]
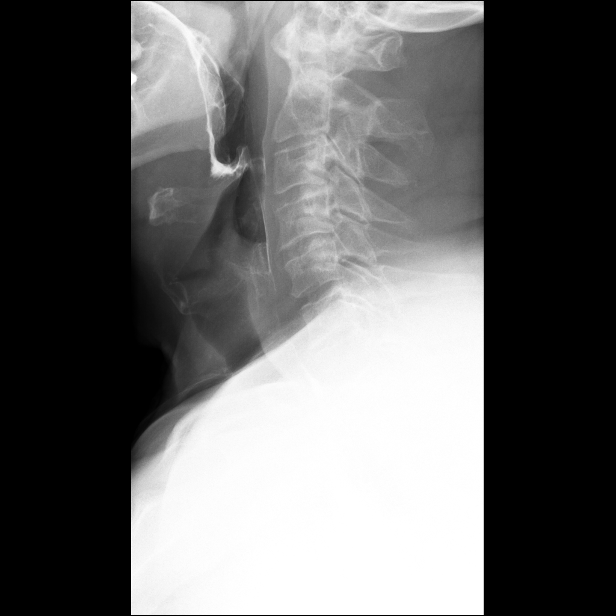
[frame 8/14]
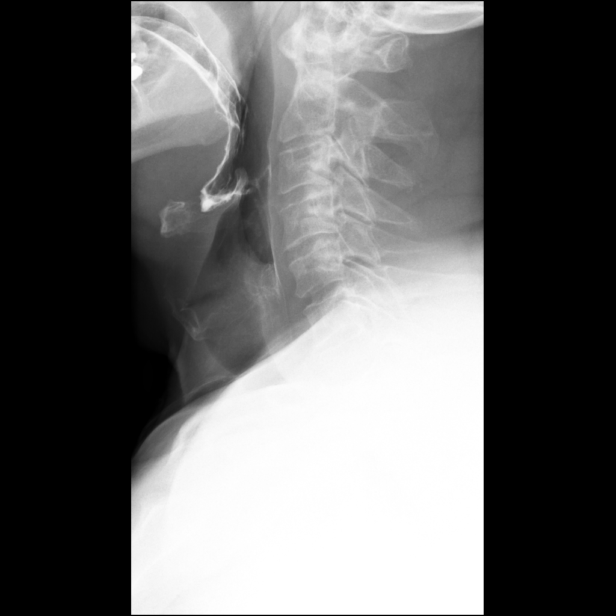

[Series 5: sequence · 3 of 9 frames shown (3 of 3)]
[frame 2/9]
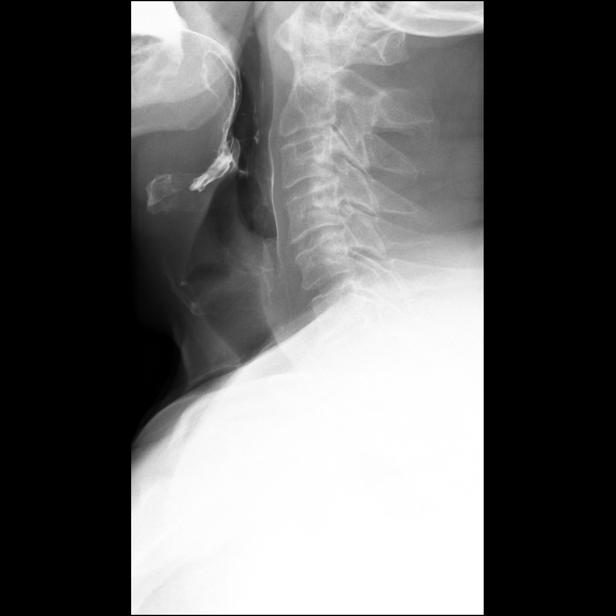
[frame 5/9]
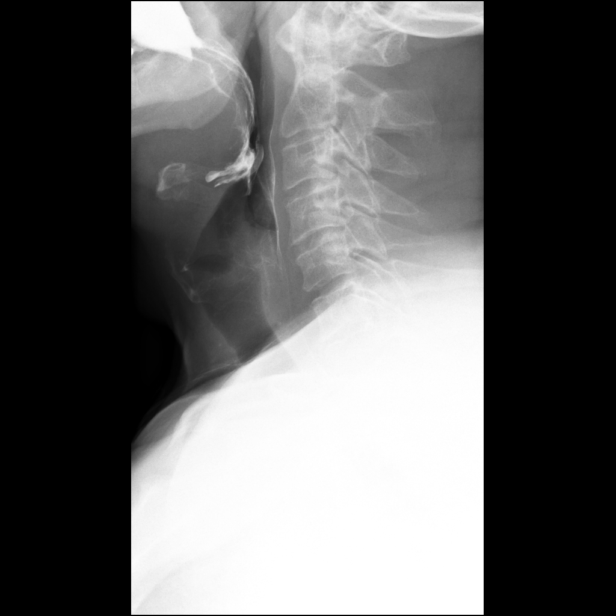
[frame 8/9]
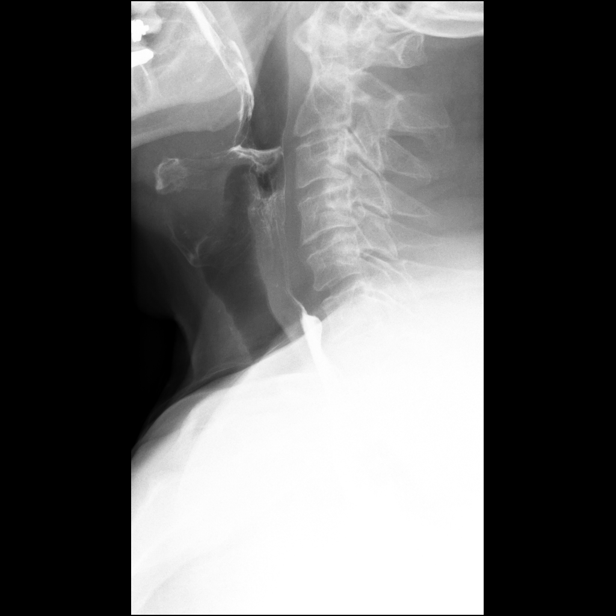

[Series 6: one shot · 1 of 1 slices shown (3 of 3)]
[im 1/1]
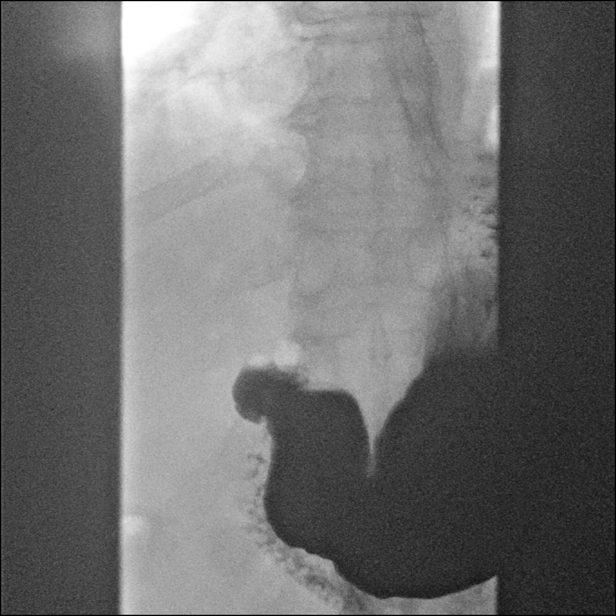

[14 of 16 positions shown; findings below may reference images not displayed]

FINDINGS: The esophagus appears patent. No stricture or mass identified. A 13
mm barium tablet was ingested which easily passed through the
esophagus and into the stomach. Small sliding-type hiatal hernia
identified. No reflux noted. No reflux identified. Pulse fluoro
assessment of the swallowing mechanism was performed. No aspiration
identified.
IMPRESSION: 1. Small hiatal hernia.

## 2018-08-22 DIAGNOSIS — G4733 Obstructive sleep apnea (adult) (pediatric): Secondary | ICD-10-CM | POA: Diagnosis not present

## 2018-08-22 DIAGNOSIS — M1990 Unspecified osteoarthritis, unspecified site: Secondary | ICD-10-CM | POA: Diagnosis not present

## 2018-09-20 DIAGNOSIS — G4733 Obstructive sleep apnea (adult) (pediatric): Secondary | ICD-10-CM | POA: Diagnosis not present

## 2018-09-20 DIAGNOSIS — M1990 Unspecified osteoarthritis, unspecified site: Secondary | ICD-10-CM | POA: Diagnosis not present

## 2018-09-22 DIAGNOSIS — G4733 Obstructive sleep apnea (adult) (pediatric): Secondary | ICD-10-CM | POA: Diagnosis not present

## 2018-09-30 ENCOUNTER — Other Ambulatory Visit: Payer: Self-pay | Admitting: Cardiology

## 2018-10-08 DIAGNOSIS — N400 Enlarged prostate without lower urinary tract symptoms: Secondary | ICD-10-CM | POA: Diagnosis not present

## 2018-10-08 DIAGNOSIS — M179 Osteoarthritis of knee, unspecified: Secondary | ICD-10-CM | POA: Diagnosis not present

## 2018-10-08 DIAGNOSIS — I2510 Atherosclerotic heart disease of native coronary artery without angina pectoris: Secondary | ICD-10-CM | POA: Diagnosis not present

## 2018-10-08 DIAGNOSIS — I10 Essential (primary) hypertension: Secondary | ICD-10-CM | POA: Diagnosis not present

## 2018-10-18 DIAGNOSIS — N5201 Erectile dysfunction due to arterial insufficiency: Secondary | ICD-10-CM | POA: Diagnosis not present

## 2018-10-18 DIAGNOSIS — Z1389 Encounter for screening for other disorder: Secondary | ICD-10-CM | POA: Diagnosis not present

## 2018-10-18 DIAGNOSIS — Z0001 Encounter for general adult medical examination with abnormal findings: Secondary | ICD-10-CM | POA: Diagnosis not present

## 2018-10-18 DIAGNOSIS — M179 Osteoarthritis of knee, unspecified: Secondary | ICD-10-CM | POA: Diagnosis not present

## 2018-10-18 DIAGNOSIS — I471 Supraventricular tachycardia: Secondary | ICD-10-CM | POA: Diagnosis not present

## 2018-10-18 DIAGNOSIS — I10 Essential (primary) hypertension: Secondary | ICD-10-CM | POA: Diagnosis not present

## 2018-10-18 DIAGNOSIS — N400 Enlarged prostate without lower urinary tract symptoms: Secondary | ICD-10-CM | POA: Diagnosis not present

## 2018-10-18 DIAGNOSIS — S9002XA Contusion of left ankle, initial encounter: Secondary | ICD-10-CM | POA: Diagnosis not present

## 2018-10-18 DIAGNOSIS — G4733 Obstructive sleep apnea (adult) (pediatric): Secondary | ICD-10-CM | POA: Diagnosis not present

## 2018-10-18 DIAGNOSIS — Z86010 Personal history of colonic polyps: Secondary | ICD-10-CM | POA: Diagnosis not present

## 2018-10-18 DIAGNOSIS — I25118 Atherosclerotic heart disease of native coronary artery with other forms of angina pectoris: Secondary | ICD-10-CM | POA: Diagnosis not present

## 2018-10-18 DIAGNOSIS — S91012A Laceration without foreign body, left ankle, initial encounter: Secondary | ICD-10-CM | POA: Diagnosis not present

## 2018-10-18 DIAGNOSIS — E7800 Pure hypercholesterolemia, unspecified: Secondary | ICD-10-CM | POA: Diagnosis not present

## 2018-10-18 DIAGNOSIS — I2510 Atherosclerotic heart disease of native coronary artery without angina pectoris: Secondary | ICD-10-CM | POA: Diagnosis not present

## 2018-10-18 DIAGNOSIS — I351 Nonrheumatic aortic (valve) insufficiency: Secondary | ICD-10-CM | POA: Diagnosis not present

## 2018-10-18 DIAGNOSIS — M109 Gout, unspecified: Secondary | ICD-10-CM | POA: Diagnosis not present

## 2018-10-21 DIAGNOSIS — M1990 Unspecified osteoarthritis, unspecified site: Secondary | ICD-10-CM | POA: Diagnosis not present

## 2018-10-21 DIAGNOSIS — G4733 Obstructive sleep apnea (adult) (pediatric): Secondary | ICD-10-CM | POA: Diagnosis not present

## 2018-11-20 DIAGNOSIS — M1990 Unspecified osteoarthritis, unspecified site: Secondary | ICD-10-CM | POA: Diagnosis not present

## 2018-11-20 DIAGNOSIS — G4733 Obstructive sleep apnea (adult) (pediatric): Secondary | ICD-10-CM | POA: Diagnosis not present

## 2018-12-15 DIAGNOSIS — L570 Actinic keratosis: Secondary | ICD-10-CM | POA: Diagnosis not present

## 2018-12-15 DIAGNOSIS — H6120 Impacted cerumen, unspecified ear: Secondary | ICD-10-CM | POA: Diagnosis not present

## 2018-12-15 DIAGNOSIS — M109 Gout, unspecified: Secondary | ICD-10-CM | POA: Diagnosis not present

## 2018-12-15 DIAGNOSIS — E782 Mixed hyperlipidemia: Secondary | ICD-10-CM | POA: Diagnosis not present

## 2018-12-15 DIAGNOSIS — C44212 Basal cell carcinoma of skin of right ear and external auricular canal: Secondary | ICD-10-CM | POA: Diagnosis not present

## 2018-12-15 DIAGNOSIS — H6123 Impacted cerumen, bilateral: Secondary | ICD-10-CM | POA: Diagnosis not present

## 2018-12-15 DIAGNOSIS — Z79899 Other long term (current) drug therapy: Secondary | ICD-10-CM | POA: Diagnosis not present

## 2018-12-15 DIAGNOSIS — E559 Vitamin D deficiency, unspecified: Secondary | ICD-10-CM | POA: Diagnosis not present

## 2018-12-15 DIAGNOSIS — L820 Inflamed seborrheic keratosis: Secondary | ICD-10-CM | POA: Diagnosis not present

## 2018-12-15 DIAGNOSIS — I10 Essential (primary) hypertension: Secondary | ICD-10-CM | POA: Diagnosis not present

## 2018-12-16 DIAGNOSIS — I2510 Atherosclerotic heart disease of native coronary artery without angina pectoris: Secondary | ICD-10-CM | POA: Diagnosis not present

## 2018-12-16 DIAGNOSIS — E7800 Pure hypercholesterolemia, unspecified: Secondary | ICD-10-CM | POA: Diagnosis not present

## 2018-12-16 DIAGNOSIS — N400 Enlarged prostate without lower urinary tract symptoms: Secondary | ICD-10-CM | POA: Diagnosis not present

## 2018-12-16 DIAGNOSIS — I10 Essential (primary) hypertension: Secondary | ICD-10-CM | POA: Diagnosis not present

## 2018-12-16 DIAGNOSIS — M179 Osteoarthritis of knee, unspecified: Secondary | ICD-10-CM | POA: Diagnosis not present

## 2018-12-16 DIAGNOSIS — E782 Mixed hyperlipidemia: Secondary | ICD-10-CM | POA: Diagnosis not present

## 2018-12-21 DIAGNOSIS — M1990 Unspecified osteoarthritis, unspecified site: Secondary | ICD-10-CM | POA: Diagnosis not present

## 2018-12-21 DIAGNOSIS — G4733 Obstructive sleep apnea (adult) (pediatric): Secondary | ICD-10-CM | POA: Diagnosis not present

## 2019-01-05 DIAGNOSIS — L03019 Cellulitis of unspecified finger: Secondary | ICD-10-CM | POA: Diagnosis not present

## 2019-01-10 DIAGNOSIS — L03011 Cellulitis of right finger: Secondary | ICD-10-CM | POA: Diagnosis not present

## 2019-01-19 ENCOUNTER — Other Ambulatory Visit: Payer: Self-pay

## 2019-01-19 ENCOUNTER — Encounter: Payer: Self-pay | Admitting: Cardiology

## 2019-01-19 ENCOUNTER — Ambulatory Visit: Payer: PPO | Admitting: Cardiology

## 2019-01-19 VITALS — BP 118/67 | HR 57 | Wt 215.0 lb

## 2019-01-19 DIAGNOSIS — I2510 Atherosclerotic heart disease of native coronary artery without angina pectoris: Secondary | ICD-10-CM | POA: Diagnosis not present

## 2019-01-19 NOTE — Progress Notes (Signed)
Virtual Visit via Video Note   Subjective:   Ralph Blackwell, male    DOB: 28-Feb-1941, 78 y.o.   MRN: [id number]   I connected with the patient on 01/19/19 by a video enabled telemedicine application and verified that I am speaking with the correct person using two identifiers.     I discussed the limitations of evaluation and management by telemedicine and the availability of in person appointments. The patient expressed understanding and agreed to proceed.   This visit type was conducted due to national recommendations for restrictions regarding the COVID-19 Pandemic (e.g. social distancing).  This format is felt to be most appropriate for this patient at this time.  All issues noted in this document were discussed and addressed.  No physical exam was performed (except for noted visual exam findings with Tele health visits).  The patient has consented to conduct a Tele health visit and understands insurance will be billed.     Chief complaint:  Coronary artery disease   HPI  78 year old Caucasian male with controlled hypertension, hyperlipidemia, obstructive sleep apnea, nonobstrucitve coronary artery disease on cath 06/2017.  He has been doing very well. He walks 40 min everyday 2-2/5 miles. He denies chest pain, shortness of breath, palpitations, leg edema, orthopnea, PND, TIA/syncope. He is staying active with social work through [employer]. Blood pressure is well controlled. He recently underwent annual checkup including bloodwork with Dr. Inda Merlin. I do not have the results available to me.    Past Medical History:  Diagnosis Date  . Coronary artery disease   . GERD (gastroesophageal reflux disease)   . Gout   . History of hiatal hernia   . Hypertension   . Pulmonary embolism ([place]) 2008 after knee surgey  . Renal insufficiency    hx renal failure 2008 after blood clots  . Sleep apnea      Past Surgical History:  Procedure Laterality Date  . arthroscopic knee  surgery  2004  . BACK SURGERY  nov 1979 and 1998   lower  . COLONOSCOPY WITH PROPOFOL N/A 10/20/2016   Procedure: COLONOSCOPY WITH PROPOFOL;  Surgeon: Garlan Fair, MD;  Location: [facility];  Service: Endoscopy;  Laterality: N/A;  . JOINT REPLACEMENT Left 2007   knee   . LEFT HEART CATH AND CORONARY ANGIOGRAPHY N/A 06/30/2017   Procedure: LEFT HEART CATH AND CORONARY ANGIOGRAPHY;  Surgeon: Nigel Mormon, MD;  Location: [place] [facility];  Service: Cardiovascular;  Laterality: N/A;  . left wrist plating   2005   titanium plate  . right total knee replacement Right 2009     Social History   Socioeconomic History  . Marital status: Married    Spouse name: Not on file  . Number of children: Not on file  . Years of education: Not on file  . Highest education level: Not on file  Occupational History  . Not on file  Social Needs  . Financial resource strain: Not on file  . Food insecurity    Worry: Not on file    Inability: Not on file  . Transportation needs    Medical: Not on file    Non-medical: Not on file  Tobacco Use  . Smoking status: Never Smoker  . Smokeless tobacco: Never Used  Substance and Sexual Activity  . Alcohol use: No  . Drug use: No  . Sexual activity: Not on file  Lifestyle  . Physical activity    Days per week: Not on file  Minutes per session: Not on file  . Stress: Not on file  Relationships  . Social Herbalist on phone: Not on file    Gets together: Not on file    Attends religious service: Not on file    Active member of club or organization: Not on file    Attends meetings of clubs or organizations: Not on file    Relationship status: Not on file  . Intimate partner violence    Fear of current or ex partner: Not on file    Emotionally abused: Not on file    Physically abused: Not on file    Forced sexual activity: Not on file  Other Topics Concern  . Not on file  Social History Narrative  . Not on file       Current Outpatient Medications on File Prior to Visit  Medication Sig Dispense Refill  . allopurinol (ZYLOPRIM) 300 MG tablet Take 150 mg by mouth daily.     Marland Kitchen aspirin 81 MG tablet Take 162 mg by mouth daily.     . Cholecalciferol (VITAMIN D3) 5000 units CAPS Take 5,000 Units by mouth daily with supper.    . finasteride (PROSCAR) 5 MG tablet Take 5 mg by mouth at bedtime.     . fluticasone (FLONASE) 50 MCG/ACT nasal spray Place 1 spray into both nostrils every evening.    Marland Kitchen losartan (COZAAR) 50 MG tablet Take 50 mg by mouth every morning.    . Omega-3 Fatty Acids (FISH OIL) 1000 MG CAPS Take 1,000 mg by mouth daily.    Marland Kitchen omeprazole (PRILOSEC) 40 MG capsule Take 1 capsule (40 mg total) by mouth daily. 60 capsule 3  . rosuvastatin (CRESTOR) 40 MG tablet Take 20 mg by mouth every evening.      No current facility-administered medications on file prior to visit.     Cardiovascular studies:  EKG 02/01/2018: Sinus rhythm 63 bpm. Occasional PAC. Otherwise normal EKG  Coronary angiography 06/2017: Mild nonobstructive coronary artery disease (Prox LAD 30%) with sluggish flow in all vessels. Normal filling pressures. LVEF 55-65%  Recent labs: 07/2016: Glucose 78. BUN/Cr 23/1.08. eGFR 67. Na/K 141/4.7. Rest of the CMP normal. H/H 14/44. MCV 90. Platelets 134 TSH 1.6 Chol 146, TG 80, HDL 54, LDL 76.    Review of Systems  Constitution: Negative for decreased appetite, malaise/fatigue, weight gain and weight loss.  HENT: Negative for congestion.   Eyes: Negative for visual disturbance.  Cardiovascular: Negative for chest pain, dyspnea on exertion, leg swelling, palpitations and syncope.  Respiratory: Negative for cough.   Endocrine: Negative for cold intolerance.  Hematologic/Lymphatic: Does not bruise/bleed easily.  Skin: Negative for itching and rash.  Musculoskeletal: Negative for myalgias.  Gastrointestinal: Negative for abdominal pain, nausea and vomiting.  Genitourinary:  Negative for dysuria.  Neurological: Negative for dizziness and weakness.  Psychiatric/Behavioral: The patient is not nervous/anxious.   All other systems reviewed and are negative.        Vitals:   01/19/19 1317  BP: 118/67  Pulse: (!) 57   (Measured by the patient using a home BP monitor)  Body mass index is 31.75 kg/m. Filed Weights   01/19/19 1317  Weight: 215 lb (97.5 kg)     Observation/findings during video visit   Objective:    Physical Exam  Constitutional: He is oriented to person, place, and time. He appears well-developed and well-nourished. No distress.  Pulmonary/Chest: Effort normal.  Neurological: He is alert and oriented to  person, place, and time.  Psychiatric: He has a normal mood and affect.  Nursing note and vitals reviewed.         Assessment & Recommendations:   78 year old Caucasian male with controlled hypertension, hyperlipidemia, obstructive sleep apnea, nonobstrucitve coronary artery disease on cath 06/2017.  CAD: Stable. No angina symptoms. Continue asapirin, statin, risk factor management  Hypertension: Continue current antihypertensive therapy.  Will obtain recent bloodwork from PCP office.  Follow up in 1 year, unless any clinical change.    Nigel Mormon, MD [facility] Pager: [phone] Office: [phone] If no answer Cell [phone]

## 2019-01-20 DIAGNOSIS — G4733 Obstructive sleep apnea (adult) (pediatric): Secondary | ICD-10-CM | POA: Diagnosis not present

## 2019-01-20 DIAGNOSIS — M1990 Unspecified osteoarthritis, unspecified site: Secondary | ICD-10-CM | POA: Diagnosis not present

## 2019-02-01 DIAGNOSIS — Z20828 Contact with and (suspected) exposure to other viral communicable diseases: Secondary | ICD-10-CM | POA: Diagnosis not present

## 2019-02-01 DIAGNOSIS — Z7189 Other specified counseling: Secondary | ICD-10-CM | POA: Diagnosis not present

## 2019-02-02 ENCOUNTER — Ambulatory Visit: Payer: PPO | Admitting: Cardiology

## 2019-02-20 DIAGNOSIS — M1990 Unspecified osteoarthritis, unspecified site: Secondary | ICD-10-CM | POA: Diagnosis not present

## 2019-02-20 DIAGNOSIS — G4733 Obstructive sleep apnea (adult) (pediatric): Secondary | ICD-10-CM | POA: Diagnosis not present

## 2019-03-23 DIAGNOSIS — M1990 Unspecified osteoarthritis, unspecified site: Secondary | ICD-10-CM | POA: Diagnosis not present

## 2019-03-23 DIAGNOSIS — G4733 Obstructive sleep apnea (adult) (pediatric): Secondary | ICD-10-CM | POA: Diagnosis not present

## 2019-03-24 DIAGNOSIS — M179 Osteoarthritis of knee, unspecified: Secondary | ICD-10-CM | POA: Diagnosis not present

## 2019-03-24 DIAGNOSIS — I2510 Atherosclerotic heart disease of native coronary artery without angina pectoris: Secondary | ICD-10-CM | POA: Diagnosis not present

## 2019-03-24 DIAGNOSIS — I10 Essential (primary) hypertension: Secondary | ICD-10-CM | POA: Diagnosis not present

## 2019-03-24 DIAGNOSIS — E782 Mixed hyperlipidemia: Secondary | ICD-10-CM | POA: Diagnosis not present

## 2019-03-24 DIAGNOSIS — E7800 Pure hypercholesterolemia, unspecified: Secondary | ICD-10-CM | POA: Diagnosis not present

## 2019-03-24 DIAGNOSIS — N400 Enlarged prostate without lower urinary tract symptoms: Secondary | ICD-10-CM | POA: Diagnosis not present

## 2019-04-13 DIAGNOSIS — Z20828 Contact with and (suspected) exposure to other viral communicable diseases: Secondary | ICD-10-CM | POA: Diagnosis not present

## 2019-04-13 DIAGNOSIS — Z7189 Other specified counseling: Secondary | ICD-10-CM | POA: Diagnosis not present

## 2019-04-22 DIAGNOSIS — M1990 Unspecified osteoarthritis, unspecified site: Secondary | ICD-10-CM | POA: Diagnosis not present

## 2019-04-22 DIAGNOSIS — G4733 Obstructive sleep apnea (adult) (pediatric): Secondary | ICD-10-CM | POA: Diagnosis not present

## 2019-04-29 DIAGNOSIS — E7800 Pure hypercholesterolemia, unspecified: Secondary | ICD-10-CM | POA: Diagnosis not present

## 2019-04-29 DIAGNOSIS — N400 Enlarged prostate without lower urinary tract symptoms: Secondary | ICD-10-CM | POA: Diagnosis not present

## 2019-04-29 DIAGNOSIS — I2510 Atherosclerotic heart disease of native coronary artery without angina pectoris: Secondary | ICD-10-CM | POA: Diagnosis not present

## 2019-04-29 DIAGNOSIS — I10 Essential (primary) hypertension: Secondary | ICD-10-CM | POA: Diagnosis not present

## 2019-04-29 DIAGNOSIS — M179 Osteoarthritis of knee, unspecified: Secondary | ICD-10-CM | POA: Diagnosis not present

## 2019-04-29 DIAGNOSIS — E782 Mixed hyperlipidemia: Secondary | ICD-10-CM | POA: Diagnosis not present

## 2019-05-13 DIAGNOSIS — G4733 Obstructive sleep apnea (adult) (pediatric): Secondary | ICD-10-CM | POA: Diagnosis not present

## 2019-05-23 DIAGNOSIS — G4733 Obstructive sleep apnea (adult) (pediatric): Secondary | ICD-10-CM | POA: Diagnosis not present

## 2019-05-23 DIAGNOSIS — M1990 Unspecified osteoarthritis, unspecified site: Secondary | ICD-10-CM | POA: Diagnosis not present

## 2019-06-01 ENCOUNTER — Other Ambulatory Visit: Payer: Self-pay

## 2019-06-01 MED ORDER — OMEPRAZOLE 40 MG PO CPDR: 40.0000 mg | DELAYED_RELEASE_CAPSULE | Freq: Every day | ORAL | 1 refills | Status: DC

## 2019-07-06 DIAGNOSIS — I10 Essential (primary) hypertension: Secondary | ICD-10-CM | POA: Diagnosis not present

## 2019-07-06 DIAGNOSIS — E7800 Pure hypercholesterolemia, unspecified: Secondary | ICD-10-CM | POA: Diagnosis not present

## 2019-07-06 DIAGNOSIS — M179 Osteoarthritis of knee, unspecified: Secondary | ICD-10-CM | POA: Diagnosis not present

## 2019-07-06 DIAGNOSIS — N400 Enlarged prostate without lower urinary tract symptoms: Secondary | ICD-10-CM | POA: Diagnosis not present

## 2019-07-06 DIAGNOSIS — I2510 Atherosclerotic heart disease of native coronary artery without angina pectoris: Secondary | ICD-10-CM | POA: Diagnosis not present

## 2019-07-06 DIAGNOSIS — E782 Mixed hyperlipidemia: Secondary | ICD-10-CM | POA: Diagnosis not present

## 2019-08-10 DIAGNOSIS — G4733 Obstructive sleep apnea (adult) (pediatric): Secondary | ICD-10-CM | POA: Diagnosis not present

## 2019-08-11 DIAGNOSIS — M179 Osteoarthritis of knee, unspecified: Secondary | ICD-10-CM | POA: Diagnosis not present

## 2019-08-11 DIAGNOSIS — E7800 Pure hypercholesterolemia, unspecified: Secondary | ICD-10-CM | POA: Diagnosis not present

## 2019-08-11 DIAGNOSIS — E782 Mixed hyperlipidemia: Secondary | ICD-10-CM | POA: Diagnosis not present

## 2019-08-11 DIAGNOSIS — I10 Essential (primary) hypertension: Secondary | ICD-10-CM | POA: Diagnosis not present

## 2019-08-11 DIAGNOSIS — N400 Enlarged prostate without lower urinary tract symptoms: Secondary | ICD-10-CM | POA: Diagnosis not present

## 2019-08-11 DIAGNOSIS — I2510 Atherosclerotic heart disease of native coronary artery without angina pectoris: Secondary | ICD-10-CM | POA: Diagnosis not present

## 2019-08-12 DIAGNOSIS — G4733 Obstructive sleep apnea (adult) (pediatric): Secondary | ICD-10-CM | POA: Diagnosis not present

## 2019-08-17 DIAGNOSIS — M1990 Unspecified osteoarthritis, unspecified site: Secondary | ICD-10-CM | POA: Diagnosis not present

## 2019-08-17 DIAGNOSIS — G4733 Obstructive sleep apnea (adult) (pediatric): Secondary | ICD-10-CM | POA: Diagnosis not present

## 2019-09-02 DIAGNOSIS — E7800 Pure hypercholesterolemia, unspecified: Secondary | ICD-10-CM | POA: Diagnosis not present

## 2019-09-02 DIAGNOSIS — I10 Essential (primary) hypertension: Secondary | ICD-10-CM | POA: Diagnosis not present

## 2019-09-02 DIAGNOSIS — E782 Mixed hyperlipidemia: Secondary | ICD-10-CM | POA: Diagnosis not present

## 2019-09-02 DIAGNOSIS — N400 Enlarged prostate without lower urinary tract symptoms: Secondary | ICD-10-CM | POA: Diagnosis not present

## 2019-09-02 DIAGNOSIS — I2510 Atherosclerotic heart disease of native coronary artery without angina pectoris: Secondary | ICD-10-CM | POA: Diagnosis not present

## 2019-09-02 DIAGNOSIS — M179 Osteoarthritis of knee, unspecified: Secondary | ICD-10-CM | POA: Diagnosis not present

## 2019-09-07 DIAGNOSIS — R351 Nocturia: Secondary | ICD-10-CM | POA: Diagnosis not present

## 2019-09-07 DIAGNOSIS — N5201 Erectile dysfunction due to arterial insufficiency: Secondary | ICD-10-CM | POA: Diagnosis not present

## 2019-09-07 DIAGNOSIS — N401 Enlarged prostate with lower urinary tract symptoms: Secondary | ICD-10-CM | POA: Diagnosis not present

## 2019-09-20 DIAGNOSIS — M1990 Unspecified osteoarthritis, unspecified site: Secondary | ICD-10-CM | POA: Diagnosis not present

## 2019-09-20 DIAGNOSIS — G4733 Obstructive sleep apnea (adult) (pediatric): Secondary | ICD-10-CM | POA: Diagnosis not present

## 2019-09-20 DIAGNOSIS — R109 Unspecified abdominal pain: Secondary | ICD-10-CM | POA: Diagnosis not present

## 2019-09-20 DIAGNOSIS — Z8719 Personal history of other diseases of the digestive system: Secondary | ICD-10-CM | POA: Diagnosis not present

## 2019-09-22 DIAGNOSIS — E782 Mixed hyperlipidemia: Secondary | ICD-10-CM | POA: Diagnosis not present

## 2019-09-22 DIAGNOSIS — M179 Osteoarthritis of knee, unspecified: Secondary | ICD-10-CM | POA: Diagnosis not present

## 2019-09-22 DIAGNOSIS — I2510 Atherosclerotic heart disease of native coronary artery without angina pectoris: Secondary | ICD-10-CM | POA: Diagnosis not present

## 2019-09-22 DIAGNOSIS — N400 Enlarged prostate without lower urinary tract symptoms: Secondary | ICD-10-CM | POA: Diagnosis not present

## 2019-09-22 DIAGNOSIS — E7800 Pure hypercholesterolemia, unspecified: Secondary | ICD-10-CM | POA: Diagnosis not present

## 2019-09-22 DIAGNOSIS — I10 Essential (primary) hypertension: Secondary | ICD-10-CM | POA: Diagnosis not present

## 2019-10-21 DIAGNOSIS — M1990 Unspecified osteoarthritis, unspecified site: Secondary | ICD-10-CM | POA: Diagnosis not present

## 2019-10-21 DIAGNOSIS — G4733 Obstructive sleep apnea (adult) (pediatric): Secondary | ICD-10-CM | POA: Diagnosis not present

## 2019-11-03 DIAGNOSIS — K219 Gastro-esophageal reflux disease without esophagitis: Secondary | ICD-10-CM | POA: Diagnosis not present

## 2019-11-03 DIAGNOSIS — E559 Vitamin D deficiency, unspecified: Secondary | ICD-10-CM | POA: Diagnosis not present

## 2019-11-03 DIAGNOSIS — Z0001 Encounter for general adult medical examination with abnormal findings: Secondary | ICD-10-CM | POA: Diagnosis not present

## 2019-11-03 DIAGNOSIS — L820 Inflamed seborrheic keratosis: Secondary | ICD-10-CM | POA: Diagnosis not present

## 2019-11-03 DIAGNOSIS — M109 Gout, unspecified: Secondary | ICD-10-CM | POA: Diagnosis not present

## 2019-11-03 DIAGNOSIS — I2510 Atherosclerotic heart disease of native coronary artery without angina pectoris: Secondary | ICD-10-CM | POA: Diagnosis not present

## 2019-11-03 DIAGNOSIS — E7800 Pure hypercholesterolemia, unspecified: Secondary | ICD-10-CM | POA: Diagnosis not present

## 2019-11-03 DIAGNOSIS — N400 Enlarged prostate without lower urinary tract symptoms: Secondary | ICD-10-CM | POA: Diagnosis not present

## 2019-11-03 DIAGNOSIS — J309 Allergic rhinitis, unspecified: Secondary | ICD-10-CM | POA: Diagnosis not present

## 2019-11-03 DIAGNOSIS — I10 Essential (primary) hypertension: Secondary | ICD-10-CM | POA: Diagnosis not present

## 2019-11-03 DIAGNOSIS — Z1389 Encounter for screening for other disorder: Secondary | ICD-10-CM | POA: Diagnosis not present

## 2019-11-03 DIAGNOSIS — M179 Osteoarthritis of knee, unspecified: Secondary | ICD-10-CM | POA: Diagnosis not present

## 2019-11-04 DIAGNOSIS — I2510 Atherosclerotic heart disease of native coronary artery without angina pectoris: Secondary | ICD-10-CM | POA: Diagnosis not present

## 2019-11-04 DIAGNOSIS — M179 Osteoarthritis of knee, unspecified: Secondary | ICD-10-CM | POA: Diagnosis not present

## 2019-11-04 DIAGNOSIS — E782 Mixed hyperlipidemia: Secondary | ICD-10-CM | POA: Diagnosis not present

## 2019-11-04 DIAGNOSIS — N400 Enlarged prostate without lower urinary tract symptoms: Secondary | ICD-10-CM | POA: Diagnosis not present

## 2019-11-04 DIAGNOSIS — E7800 Pure hypercholesterolemia, unspecified: Secondary | ICD-10-CM | POA: Diagnosis not present

## 2019-11-04 DIAGNOSIS — I10 Essential (primary) hypertension: Secondary | ICD-10-CM | POA: Diagnosis not present

## 2019-11-14 DIAGNOSIS — G4733 Obstructive sleep apnea (adult) (pediatric): Secondary | ICD-10-CM | POA: Diagnosis not present

## 2019-11-16 DIAGNOSIS — Z7189 Other specified counseling: Secondary | ICD-10-CM | POA: Diagnosis not present

## 2019-11-16 DIAGNOSIS — Z20828 Contact with and (suspected) exposure to other viral communicable diseases: Secondary | ICD-10-CM | POA: Diagnosis not present

## 2019-11-20 DIAGNOSIS — M1990 Unspecified osteoarthritis, unspecified site: Secondary | ICD-10-CM | POA: Diagnosis not present

## 2019-11-20 DIAGNOSIS — G4733 Obstructive sleep apnea (adult) (pediatric): Secondary | ICD-10-CM | POA: Diagnosis not present

## 2019-11-21 ENCOUNTER — Other Ambulatory Visit: Payer: Self-pay | Admitting: Cardiology

## 2019-12-02 DIAGNOSIS — I2510 Atherosclerotic heart disease of native coronary artery without angina pectoris: Secondary | ICD-10-CM | POA: Diagnosis not present

## 2019-12-02 DIAGNOSIS — I10 Essential (primary) hypertension: Secondary | ICD-10-CM | POA: Diagnosis not present

## 2019-12-02 DIAGNOSIS — E7800 Pure hypercholesterolemia, unspecified: Secondary | ICD-10-CM | POA: Diagnosis not present

## 2019-12-02 DIAGNOSIS — N400 Enlarged prostate without lower urinary tract symptoms: Secondary | ICD-10-CM | POA: Diagnosis not present

## 2019-12-02 DIAGNOSIS — E782 Mixed hyperlipidemia: Secondary | ICD-10-CM | POA: Diagnosis not present

## 2019-12-02 DIAGNOSIS — M179 Osteoarthritis of knee, unspecified: Secondary | ICD-10-CM | POA: Diagnosis not present

## 2020-01-19 ENCOUNTER — Ambulatory Visit: Payer: PPO | Admitting: Cardiology

## 2020-02-13 ENCOUNTER — Ambulatory Visit: Payer: PPO | Admitting: Cardiology

## 2020-02-17 DIAGNOSIS — G4733 Obstructive sleep apnea (adult) (pediatric): Secondary | ICD-10-CM | POA: Diagnosis not present

## 2020-02-20 ENCOUNTER — Encounter: Payer: Self-pay | Admitting: Cardiology

## 2020-02-20 ENCOUNTER — Other Ambulatory Visit: Payer: Self-pay

## 2020-02-20 ENCOUNTER — Ambulatory Visit: Payer: PPO | Admitting: Cardiology

## 2020-02-20 VITALS — BP 123/76 | HR 75 | Resp 16 | Ht 69.0 in | Wt 218.0 lb

## 2020-02-20 DIAGNOSIS — I10 Essential (primary) hypertension: Secondary | ICD-10-CM | POA: Insufficient documentation

## 2020-02-20 DIAGNOSIS — I2510 Atherosclerotic heart disease of native coronary artery without angina pectoris: Secondary | ICD-10-CM | POA: Insufficient documentation

## 2020-02-20 DIAGNOSIS — I723 Aneurysm of iliac artery: Secondary | ICD-10-CM

## 2020-02-20 NOTE — Progress Notes (Signed)
Subjective:   Ralph Blackwell, male    DOB: 1941-02-15, 79 y.o.   MRN: [id number]   Chief complaint:  Coronary artery disease   HPI  79 year old Caucasian male with hypertension, hyperlipidemia, mild nonobstructive CAD, Rt CIA aneurysm (2 cm in 2017)  Patient is doing well. He denies chest pain, shortness of breath, palpitations, leg edema, orthopnea, PND, TIA/syncope. He has not had any workup VW:PVXYI artery aneurysm since then.  Reviewed recent lipid panel with the patient.  Current Outpatient Medications on File Prior to Visit  Medication Sig Dispense Refill  . allopurinol (ZYLOPRIM) 300 MG tablet Take 150 mg by mouth daily.     Marland Kitchen aspirin 81 MG tablet Take 162 mg by mouth daily.     . Cholecalciferol (VITAMIN D3) 5000 units CAPS Take 5,000 Units by mouth daily with supper.    . finasteride (PROSCAR) 5 MG tablet Take 5 mg by mouth at bedtime.     . fluticasone (FLONASE) 50 MCG/ACT nasal spray Place 1 spray into both nostrils every evening.    Marland Kitchen losartan (COZAAR) 50 MG tablet Take 50 mg by mouth every morning.    . Omega-3 Fatty Acids (FISH OIL) 1000 MG CAPS Take 1,000 mg by mouth daily.    Marland Kitchen omeprazole (PRILOSEC) 40 MG capsule Take 1 capsule (40 mg total) by mouth daily. 90 capsule 1  . rosuvastatin (CRESTOR) 40 MG tablet Take 20 mg by mouth every evening.      No current facility-administered medications on file prior to visit.    Cardiovascular studies:  EKG 02/20/2020: Sinus rhythm 86 bpm Sinus arrhythmia Left axis deviation Low voltage in precordial leads   Coronary angiography 06/2017: Mild nonobstructive coronary artery disease (Prox LAD 30%) with sluggish flow in all vessels. Normal filling pressures. LVEF 55-65%  CT abdomen 2017: 1. Continued diverticulitis in the descending colon. A collection of fluid and gas along the medial aspect of the descending colon, at the site of diverticulitis, is consistent with an intramural abscess or contained  perforation. This may explain the patient's resistance to treatment. 2. 10 mm lingular nodule. This appears to be associated with adjacent scarring. Recommend a three to six-month follow-up CT of the chest to ensure stability or resolution. 3. 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 The left common iliac artery is also prominent measuring 1.5 cm. These results will be called to the ordering clinician or representative by the Radiologist Assistant, and communication documented in the PACS or zVision Dashboard.   Recent labs: 11/03/2019: Glucose 106, BUN/Cr 21/1.0. EGFR 76 HbA1C N/A Chol 127, TG 81, HDL 51, LDL 60 TSH 1.9 normal  07/2016: Glucose 78. BUN/Cr 23/1.08. eGFR 67. Na/K 141/4.7. Rest of the CMP normal. H/H 14/44. MCV 90. Platelets 134 TSH 1.6 Chol 146, TG 80, HDL 54, LDL 76.    Review of Systems  Cardiovascular: Negative for chest pain, dyspnea on exertion, leg swelling, palpitations and syncope.         Vitals:   02/20/20 0958  BP: 123/76  Pulse: 75  Resp: 16  SpO2: 95%     Body mass index is 32.19 kg/m. Filed Weights   02/20/20 0958  Weight: 218 lb (98.9 kg)    Objective:    Physical Exam Vitals and nursing note reviewed.  Constitutional:      General: He is not in acute distress. Neck:     Vascular: No JVD.  Cardiovascular:     Rate and  Rhythm: Normal rate and regular rhythm.     Heart sounds: Normal heart sounds. No murmur heard.   Pulmonary:     Effort: Pulmonary effort is normal.     Breath sounds: Normal breath sounds. No wheezing or rales.           Assessment & Recommendations:   79 year old Caucasian male with hypertension, hyperlipidemia, mild nonobstructive CAD, Rt CIA aneurysm (2 cm in 2017)  CAD: Stable. No angina symptoms. Continue asapirin, statin, risk factor management  Hypertension: Continue current antihypertensive therapy.  Rt CIA aneurysm: Incidental finding on CT scan in  2017. Will obtain ultrasound surveillance.   F/u in 1 year   Nigel Mormon, MD [facility] Pager: [phone] Office: [phone] If no answer Cell [phone]

## 2020-03-09 DIAGNOSIS — Z20822 Contact with and (suspected) exposure to covid-19: Secondary | ICD-10-CM | POA: Diagnosis not present

## 2020-03-10 DIAGNOSIS — Z20828 Contact with and (suspected) exposure to other viral communicable diseases: Secondary | ICD-10-CM | POA: Diagnosis not present

## 2020-03-22 ENCOUNTER — Ambulatory Visit: Payer: PPO

## 2020-03-22 ENCOUNTER — Other Ambulatory Visit: Payer: Self-pay

## 2020-03-22 DIAGNOSIS — I723 Aneurysm of iliac artery: Secondary | ICD-10-CM

## 2020-03-26 DIAGNOSIS — M65342 Trigger finger, left ring finger: Secondary | ICD-10-CM | POA: Diagnosis not present

## 2020-03-26 DIAGNOSIS — M25532 Pain in left wrist: Secondary | ICD-10-CM | POA: Diagnosis not present

## 2020-03-29 DIAGNOSIS — E782 Mixed hyperlipidemia: Secondary | ICD-10-CM | POA: Diagnosis not present

## 2020-03-29 DIAGNOSIS — I10 Essential (primary) hypertension: Secondary | ICD-10-CM | POA: Diagnosis not present

## 2020-03-29 DIAGNOSIS — M179 Osteoarthritis of knee, unspecified: Secondary | ICD-10-CM | POA: Diagnosis not present

## 2020-03-29 DIAGNOSIS — I2510 Atherosclerotic heart disease of native coronary artery without angina pectoris: Secondary | ICD-10-CM | POA: Diagnosis not present

## 2020-03-29 DIAGNOSIS — N400 Enlarged prostate without lower urinary tract symptoms: Secondary | ICD-10-CM | POA: Diagnosis not present

## 2020-03-29 DIAGNOSIS — E7800 Pure hypercholesterolemia, unspecified: Secondary | ICD-10-CM | POA: Diagnosis not present

## 2020-04-03 DIAGNOSIS — L570 Actinic keratosis: Secondary | ICD-10-CM | POA: Diagnosis not present

## 2020-04-03 DIAGNOSIS — H61002 Unspecified perichondritis of left external ear: Secondary | ICD-10-CM | POA: Diagnosis not present

## 2020-04-03 DIAGNOSIS — L821 Other seborrheic keratosis: Secondary | ICD-10-CM | POA: Diagnosis not present

## 2020-04-20 DIAGNOSIS — M179 Osteoarthritis of knee, unspecified: Secondary | ICD-10-CM | POA: Diagnosis not present

## 2020-04-20 DIAGNOSIS — I2510 Atherosclerotic heart disease of native coronary artery without angina pectoris: Secondary | ICD-10-CM | POA: Diagnosis not present

## 2020-04-20 DIAGNOSIS — N400 Enlarged prostate without lower urinary tract symptoms: Secondary | ICD-10-CM | POA: Diagnosis not present

## 2020-04-20 DIAGNOSIS — I10 Essential (primary) hypertension: Secondary | ICD-10-CM | POA: Diagnosis not present

## 2020-04-20 DIAGNOSIS — E782 Mixed hyperlipidemia: Secondary | ICD-10-CM | POA: Diagnosis not present

## 2020-04-20 DIAGNOSIS — E7800 Pure hypercholesterolemia, unspecified: Secondary | ICD-10-CM | POA: Diagnosis not present

## 2020-05-16 DIAGNOSIS — G5602 Carpal tunnel syndrome, left upper limb: Secondary | ICD-10-CM | POA: Diagnosis not present

## 2020-05-16 DIAGNOSIS — M65342 Trigger finger, left ring finger: Secondary | ICD-10-CM | POA: Diagnosis not present

## 2020-05-17 DIAGNOSIS — G4733 Obstructive sleep apnea (adult) (pediatric): Secondary | ICD-10-CM | POA: Diagnosis not present

## 2020-05-18 ENCOUNTER — Other Ambulatory Visit: Payer: Self-pay | Admitting: Cardiology

## 2020-05-21 ENCOUNTER — Telehealth: Payer: Self-pay

## 2020-05-21 NOTE — Telephone Encounter (Signed)
Called and spoke with patient regarding his Aorta Duplex results.

## 2020-05-21 NOTE — Telephone Encounter (Signed)
Patient called asking for results for Aorta Duplex done 03/22/2020. Ordered by Dr. Virgina Jock, read by Dr. Einar Gip. Please advise.

## 2020-05-21 NOTE — Telephone Encounter (Signed)
Small aneurysm. No changes needed in medical therapy. Will repeat ultrasound in 5 years.   Thanks MJP

## 2020-05-28 DIAGNOSIS — Z9889 Other specified postprocedural states: Secondary | ICD-10-CM | POA: Diagnosis not present

## 2020-06-28 DIAGNOSIS — M25562 Pain in left knee: Secondary | ICD-10-CM | POA: Diagnosis not present

## 2020-06-28 DIAGNOSIS — M25561 Pain in right knee: Secondary | ICD-10-CM | POA: Diagnosis not present

## 2020-08-20 DIAGNOSIS — G4733 Obstructive sleep apnea (adult) (pediatric): Secondary | ICD-10-CM | POA: Diagnosis not present

## 2020-09-25 DIAGNOSIS — I2510 Atherosclerotic heart disease of native coronary artery without angina pectoris: Secondary | ICD-10-CM | POA: Diagnosis not present

## 2020-09-25 DIAGNOSIS — I10 Essential (primary) hypertension: Secondary | ICD-10-CM | POA: Diagnosis not present

## 2020-09-25 DIAGNOSIS — N400 Enlarged prostate without lower urinary tract symptoms: Secondary | ICD-10-CM | POA: Diagnosis not present

## 2020-09-25 DIAGNOSIS — E782 Mixed hyperlipidemia: Secondary | ICD-10-CM | POA: Diagnosis not present

## 2020-09-25 DIAGNOSIS — M179 Osteoarthritis of knee, unspecified: Secondary | ICD-10-CM | POA: Diagnosis not present

## 2020-09-25 DIAGNOSIS — K219 Gastro-esophageal reflux disease without esophagitis: Secondary | ICD-10-CM | POA: Diagnosis not present

## 2020-09-25 DIAGNOSIS — G4733 Obstructive sleep apnea (adult) (pediatric): Secondary | ICD-10-CM | POA: Diagnosis not present

## 2020-11-06 DIAGNOSIS — K219 Gastro-esophageal reflux disease without esophagitis: Secondary | ICD-10-CM | POA: Diagnosis not present

## 2020-11-06 DIAGNOSIS — I10 Essential (primary) hypertension: Secondary | ICD-10-CM | POA: Diagnosis not present

## 2020-11-06 DIAGNOSIS — Z79899 Other long term (current) drug therapy: Secondary | ICD-10-CM | POA: Diagnosis not present

## 2020-11-06 DIAGNOSIS — E559 Vitamin D deficiency, unspecified: Secondary | ICD-10-CM | POA: Diagnosis not present

## 2020-11-06 DIAGNOSIS — E782 Mixed hyperlipidemia: Secondary | ICD-10-CM | POA: Diagnosis not present

## 2020-11-06 DIAGNOSIS — M109 Gout, unspecified: Secondary | ICD-10-CM | POA: Diagnosis not present

## 2020-11-06 DIAGNOSIS — I499 Cardiac arrhythmia, unspecified: Secondary | ICD-10-CM | POA: Diagnosis not present

## 2020-11-06 DIAGNOSIS — Z0001 Encounter for general adult medical examination with abnormal findings: Secondary | ICD-10-CM | POA: Diagnosis not present

## 2020-11-06 DIAGNOSIS — G4733 Obstructive sleep apnea (adult) (pediatric): Secondary | ICD-10-CM | POA: Diagnosis not present

## 2020-11-06 DIAGNOSIS — H9193 Unspecified hearing loss, bilateral: Secondary | ICD-10-CM | POA: Diagnosis not present

## 2020-11-06 DIAGNOSIS — I723 Aneurysm of iliac artery: Secondary | ICD-10-CM | POA: Diagnosis not present

## 2020-11-06 DIAGNOSIS — I2510 Atherosclerotic heart disease of native coronary artery without angina pectoris: Secondary | ICD-10-CM | POA: Diagnosis not present

## 2020-11-20 ENCOUNTER — Other Ambulatory Visit: Payer: Self-pay

## 2020-11-20 DIAGNOSIS — I723 Aneurysm of iliac artery: Secondary | ICD-10-CM

## 2020-11-21 DIAGNOSIS — J029 Acute pharyngitis, unspecified: Secondary | ICD-10-CM | POA: Diagnosis not present

## 2020-11-21 DIAGNOSIS — Z20828 Contact with and (suspected) exposure to other viral communicable diseases: Secondary | ICD-10-CM | POA: Diagnosis not present

## 2020-11-21 DIAGNOSIS — R509 Fever, unspecified: Secondary | ICD-10-CM | POA: Diagnosis not present

## 2020-11-21 DIAGNOSIS — R051 Acute cough: Secondary | ICD-10-CM | POA: Diagnosis not present

## 2020-11-21 DIAGNOSIS — R6883 Chills (without fever): Secondary | ICD-10-CM | POA: Diagnosis not present

## 2020-11-23 DIAGNOSIS — K219 Gastro-esophageal reflux disease without esophagitis: Secondary | ICD-10-CM | POA: Diagnosis not present

## 2020-11-23 DIAGNOSIS — E7800 Pure hypercholesterolemia, unspecified: Secondary | ICD-10-CM | POA: Diagnosis not present

## 2020-11-23 DIAGNOSIS — N400 Enlarged prostate without lower urinary tract symptoms: Secondary | ICD-10-CM | POA: Diagnosis not present

## 2020-11-23 DIAGNOSIS — I2510 Atherosclerotic heart disease of native coronary artery without angina pectoris: Secondary | ICD-10-CM | POA: Diagnosis not present

## 2020-11-23 DIAGNOSIS — E782 Mixed hyperlipidemia: Secondary | ICD-10-CM | POA: Diagnosis not present

## 2020-11-23 DIAGNOSIS — I10 Essential (primary) hypertension: Secondary | ICD-10-CM | POA: Diagnosis not present

## 2020-12-04 DIAGNOSIS — R0981 Nasal congestion: Secondary | ICD-10-CM | POA: Diagnosis not present

## 2020-12-04 DIAGNOSIS — J309 Allergic rhinitis, unspecified: Secondary | ICD-10-CM | POA: Diagnosis not present

## 2020-12-04 DIAGNOSIS — R051 Acute cough: Secondary | ICD-10-CM | POA: Diagnosis not present

## 2020-12-12 ENCOUNTER — Ambulatory Visit (HOSPITAL_COMMUNITY)
Admission: RE | Admit: 2020-12-12 | Discharge: 2020-12-12 | Disposition: A | Discharge status: Home / Self Care | Payer: PPO | Source: Ambulatory Visit | Attending: Vascular Surgery | Admitting: Vascular Surgery

## 2020-12-12 ENCOUNTER — Encounter: Payer: Self-pay | Admitting: Vascular Surgery

## 2020-12-12 ENCOUNTER — Ambulatory Visit: Payer: PPO | Admitting: Vascular Surgery

## 2020-12-12 ENCOUNTER — Other Ambulatory Visit: Payer: Self-pay

## 2020-12-12 VITALS — BP 111/70 | HR 69 | Temp 98.0°F | Resp 20 | Ht 69.0 in | Wt 214.4 lb

## 2020-12-12 DIAGNOSIS — I723 Aneurysm of iliac artery: Secondary | ICD-10-CM | POA: Diagnosis not present

## 2020-12-12 NOTE — Progress Notes (Signed)
ASSESSMENT & PLAN   SMALL BILATERAL COMMON ILIAC ARTERY ANEURYSMS: His small common iliac artery aneurysms have not changed in size since 2018.  The right common iliac artery measures 2.0 cm in maximum diameter and the left common iliac artery measures 1.6 cm in maximum diameter.  We would not consider elective repair unless these reached at least 3-1/2 cm in diameter.  Fortunately, he is not a smoker.  His blood pressure is under good control.  I recommended a follow-up ultrasound in 2 years and I will see him back at that time.  He does not have an abdominal aortic aneurysm.  His arteries are somewhat ectatic.  REASON FOR CONSULT:    Bilateral iliac artery aneurysms.  The consult is requested by Dr. Josetta Huddle.  HPI:   Ralph Blackwell is a 80 y.o. male who was referred with bilateral iliac artery aneurysms.  I have reviewed the records from the referring office.  The patient was seen on 11/06/2020.  He is being followed with mixed hyperlipidemia, essential hypertension, BPH, gout, gastroesophageal reflux disease, and a history of bilateral iliac artery aneurysms.  In addition he has obstructive sleep apnea.  He had last been seen in our office by Dr. Adele Barthel on 09/05/2016 with aortic ectasia and small bilateral common iliac artery aneurysms.  At that time the right was 2 cm in maximum diameter and the left was 1.5 cm in maximum diameter.  Dr. Bridgett Larsson recommended a follow-up duplex scan in 2 years.  He was then lost to follow-up.  He is sent back to reestablish follow-up.  The patient denies abdominal pain or back pain.  He denies any history of claudication, rest pain, or nonhealing ulcers.  He did have a DVT in the right lower extremity about 12 years ago that was associated with a pulmonary embolus.  He was on anticoagulation at the time but is no longer on anticoagulation.  Past Medical History:  Diagnosis Date  . Coronary artery disease   . GERD (gastroesophageal reflux disease)   .  Gout   . History of hiatal hernia   . Hypertension   . Pulmonary embolism ([place]) 2008 after knee surgey  . Renal insufficiency    hx renal failure 2008 after blood clots  . Sleep apnea     Family History  Problem Relation Age of Onset  . Heart disease Mother   . Hypertension Mother   . Heart disease Father   . Heart attack Father   . Hypertension Father     SOCIAL HISTORY: Social History   Tobacco Use  . Smoking status: Never Smoker  . Smokeless tobacco: Never Used  Substance Use Topics  . Alcohol use: No    No Known Allergies  Current Outpatient Medications  Medication Sig Dispense Refill  . allopurinol (ZYLOPRIM) 300 MG tablet Take 150 mg by mouth daily.     Marland Kitchen ascorbic acid (VITAMIN C) 500 MG tablet Take 500 mg by mouth daily.    Marland Kitchen aspirin 81 MG tablet Take 162 mg by mouth daily.     . Cholecalciferol (VITAMIN D3) 5000 units CAPS Take 2,000 Units by mouth daily with supper.     . finasteride (PROSCAR) 5 MG tablet Take 5 mg by mouth at bedtime.     . fluticasone (FLONASE) 50 MCG/ACT nasal spray Place 1 spray into both nostrils every evening.    . nitroGLYCERIN (NITROSTAT) 0.4 MG SL tablet Place 0.4 mg under the tongue every 5 (five) minutes  as needed for chest pain.    . Omega-3 Fatty Acids (FISH OIL) 1000 MG CAPS Take 1,000 mg by mouth daily.    Marland Kitchen omeprazole (PRILOSEC) 40 MG capsule Take 1 capsule (40 mg total) by mouth daily. 90 capsule 3  . rosuvastatin (CRESTOR) 40 MG tablet Take 20 mg by mouth every evening.     Marland Kitchen telmisartan (MICARDIS) 80 MG tablet Take 40 mg by mouth daily.    Marland Kitchen zinc gluconate 50 MG tablet Take 50 mg by mouth daily.     No current facility-administered medications for this visit.    REVIEW OF SYSTEMS:  [X]  denotes positive finding, [ ]  denotes negative finding Cardiac  Comments:  Chest pain or chest pressure:    Shortness of breath upon exertion:    Short of breath when lying flat:    Irregular heart rhythm:        Vascular    Pain in  calf, thigh, or hip brought on by ambulation:    Pain in feet at night that wakes you up from your sleep:     Blood clot in your veins:    Leg swelling:         Pulmonary    Oxygen at home:    Productive cough:     Wheezing:         Neurologic    Sudden weakness in arms or legs:     Sudden numbness in arms or legs:     Sudden onset of difficulty speaking or slurred speech:    Temporary loss of vision in one eye:     Problems with dizziness:         Gastrointestinal    Blood in stool:     Vomited blood:         Genitourinary    Burning when urinating:     Blood in urine:        Psychiatric    Major depression:         Hematologic    Bleeding problems:    Problems with blood clotting too easily:        Skin    Rashes or ulcers:        Constitutional    Fever or chills:    -  PHYSICAL EXAM:   Vitals:   12/12/20 0928  BP: 111/70  Pulse: 69  Resp: 20  Temp: 98 F (36.7 C)  SpO2: 94%  Weight: 214 lb 6.4 oz (97.3 kg)  Height: 5\' 9"  (1.753 m)   Body mass index is 31.66 kg/m. GENERAL: The patient is a well-nourished male, in no acute distress. The vital signs are documented above. CARDIAC: There is a regular rate and rhythm.  VASCULAR: I do not detect carotid bruits. He has palpable femoral, popliteal, dorsalis pedis, and posterior tibial pulses bilaterally. He has no significant lower extremity swelling. PULMONARY: There is good air exchange bilaterally without wheezing or rales. ABDOMEN: Soft and non-tender with normal pitched bowel sounds.  I do not palpate an abdominal aortic aneurysm. MUSCULOSKELETAL: There are no major deformities. NEUROLOGIC: No focal weakness or paresthesias are detected. SKIN: There are no ulcers or rashes noted. PSYCHIATRIC: The patient has a normal affect.  DATA:    DUPLEX ABDOMINAL AORTA: I have independently interpreted his duplex of the abdominal aorta.  The maximum diameter of his infrarenal aorta is 2.6 cm.  This would not  qualify as an aneurysm.  The artery is simply ectatic.  The right common  iliac artery measures 2.0 cm in maximal diameter.  This was 2.0 cm back in 2018.  The left common iliac artery measures 1.6 cm currently.  This was 1.5 cm back in 2018.  Deitra Mayo

## 2020-12-26 DIAGNOSIS — G4733 Obstructive sleep apnea (adult) (pediatric): Secondary | ICD-10-CM | POA: Diagnosis not present

## 2021-01-24 DIAGNOSIS — I2510 Atherosclerotic heart disease of native coronary artery without angina pectoris: Secondary | ICD-10-CM | POA: Diagnosis not present

## 2021-01-24 DIAGNOSIS — E782 Mixed hyperlipidemia: Secondary | ICD-10-CM | POA: Diagnosis not present

## 2021-01-24 DIAGNOSIS — I10 Essential (primary) hypertension: Secondary | ICD-10-CM | POA: Diagnosis not present

## 2021-01-24 DIAGNOSIS — N400 Enlarged prostate without lower urinary tract symptoms: Secondary | ICD-10-CM | POA: Diagnosis not present

## 2021-01-24 DIAGNOSIS — M179 Osteoarthritis of knee, unspecified: Secondary | ICD-10-CM | POA: Diagnosis not present

## 2021-01-24 DIAGNOSIS — E7800 Pure hypercholesterolemia, unspecified: Secondary | ICD-10-CM | POA: Diagnosis not present

## 2021-01-24 DIAGNOSIS — K219 Gastro-esophageal reflux disease without esophagitis: Secondary | ICD-10-CM | POA: Diagnosis not present

## 2021-02-19 ENCOUNTER — Other Ambulatory Visit: Payer: Self-pay

## 2021-02-20 ENCOUNTER — Ambulatory Visit: Payer: PPO | Admitting: Cardiology

## 2021-02-20 NOTE — Progress Notes (Signed)
Subjective:   Ralph Blackwell, male    DOB: [birth year], 80 y.o.   MRN: [id number]   Chief complaint:  Coronary artery disease   HPI  80 year old Caucasian male with hypertension, hyperlipidemia, mild nonobstructive CAD,   Patient is doing well.  He walks 2 miles 3-5 times a week without any chest pain, shortness of breath, palpitations, leg edema, orthopnea, PND, TIA/syncope. Recent aorta duplex, seen by Dr. Scot Dock, No significant aneurysm, but only mild ectasia.   Reviewed recent lab's from 10/2020 with the patient.  Notably, his creatinine was elevated at 1.6 at that time.  Baseline creatinine is around 1.0.  Current Outpatient Medications on File Prior to Visit  Medication Sig Dispense Refill   allopurinol (ZYLOPRIM) 300 MG tablet Take 150 mg by mouth daily.      ascorbic acid (VITAMIN C) 500 MG tablet Take 500 mg by mouth daily.     aspirin 81 MG tablet Take 162 mg by mouth daily.      Cholecalciferol (VITAMIN D3) 5000 units CAPS Take 2,000 Units by mouth daily with supper.      finasteride (PROSCAR) 5 MG tablet Take 5 mg by mouth at bedtime.      fluticasone (FLONASE) 50 MCG/ACT nasal spray Place 1 spray into both nostrils every evening.     nitroGLYCERIN (NITROSTAT) 0.4 MG SL tablet Place 0.4 mg under the tongue every 5 (five) minutes as needed for chest pain.     Omega-3 Fatty Acids (FISH OIL) 1000 MG CAPS Take 1,000 mg by mouth daily.     omeprazole (PRILOSEC) 40 MG capsule Take 1 capsule (40 mg total) by mouth daily. 90 capsule 3   rosuvastatin (CRESTOR) 40 MG tablet Take 20 mg by mouth every evening.      telmisartan (MICARDIS) 80 MG tablet Take 40 mg by mouth daily.     zinc gluconate 50 MG tablet Take 50 mg by mouth daily.     No current facility-administered medications on file prior to visit.    Cardiovascular studies:  EKG 02/21/2021: Sinus rhythm 54 bpm Low voltage in precordial leads  Abdominal Aortic Duplex  03/22/2020:  Diffuse plaque noted in the  proximal, mid and distal aorta.  A distal abdominal aortic aneurysm measuring 2.61 x 2.6 x 2.67 cm is seen.  Normal bilateral internal iliac artery size and velocity. Recheck in 5  years for stability.   Coronary angiography 06/2017: Mild nonobstructive coronary artery disease (Prox LAD 30%) with sluggish flow in all vessels. Normal filling pressures. LVEF 55-65%   CT abdomen 2017: 1. Continued diverticulitis in the descending colon. A collection of fluid and gas along the medial aspect of the descending colon, at the site of diverticulitis, is consistent with an intramural abscess or contained perforation. This may explain the patient's resistance to treatment. 2. 10 mm lingular nodule. This appears to be associated with adjacent scarring. Recommend a three to six-month follow-up CT of the chest to ensure stability or resolution. 3. 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 The left common iliac artery is also prominent measuring 1.5 cm. These results will be called to the ordering clinician or representative by the Radiologist Assistant, and communication documented in the PACS or zVision Dashboard.    Recent labs: 11/06/2020: Glucose 100, BUN/Cr 24/1.6. EGFR 64. HbA1C N/A Chol 131, TG 100, HDL 51, LDL 62 TSH 1.6 normal  11/03/2019: Glucose 106, BUN/Cr 21/1.0. EGFR 76 HbA1C N/A Chol 127, TG 81, HDL  51, LDL 60 TSH 1.9 normal  07/2016: Glucose 78. BUN/Cr 23/1.08. eGFR 67. Na/K 141/4.7. Rest of the CMP normal. H/H 14/44. MCV 90. Platelets 134 TSH 1.6 Chol 146, TG 80, HDL 54, LDL 76.    Review of Systems  Cardiovascular:  Negative for chest pain, dyspnea on exertion, leg swelling, palpitations and syncope.        Vitals:   02/21/21 0927  BP: (!) 142/73  Pulse: 87  Resp: 16  Temp: 98.7 F (37.1 C)  SpO2: 97%     Body mass index is 31.75 kg/m. Filed Weights   02/21/21 0927  Weight: 215 lb (97.5 kg)    Objective:     Physical Exam Vitals and nursing note reviewed.  Constitutional:      General: He is not in acute distress. Neck:     Vascular: No JVD.  Cardiovascular:     Rate and Rhythm: Normal rate and regular rhythm.     Heart sounds: Normal heart sounds. No murmur heard. Pulmonary:     Effort: Pulmonary effort is normal.     Breath sounds: Normal breath sounds. No wheezing or rales.  Musculoskeletal:     Right lower leg: No edema.     Left lower leg: No edema.          Assessment & Recommendations:   80 year old Caucasian male with hypertension, hyperlipidemia, mild nonobstructive CAD,   CAD: Minimal nonobstructive CAD <50% on coronary angiogram in 2018.   Benefits do not outweigh risks of aspirin 162 mg daily.  I have stopped his aspirin.  Continue statin.    Hypertension: Blood pressure mildly elevated today.  He has not taken his telmisartan.  No change made today. Notably, his creatinine was elevated at 1.6 in 10/2020, from baseline of 1.0 in 10/2019.  I will check his BMP today and forward the labs to his PCP Dr. Huston Foley in case he does need further work-up.  Rt CIA aneurysm: Incidental finding on CT scan in 2017, not appreciated on [place] in 2021. Essentially mild ectasia without any significant aneurysm.  F/u in 1 year   Nigel Mormon, MD [facility] Pager: [phone] Office: [phone] If no answer Cell [phone]

## 2021-02-21 ENCOUNTER — Encounter: Payer: Self-pay | Admitting: Cardiology

## 2021-02-21 ENCOUNTER — Ambulatory Visit: Payer: PPO | Admitting: Cardiology

## 2021-02-21 ENCOUNTER — Other Ambulatory Visit: Payer: Self-pay

## 2021-02-21 VITALS — BP 142/73 | HR 87 | Temp 98.7°F | Resp 16 | Ht 69.0 in | Wt 215.0 lb

## 2021-02-21 DIAGNOSIS — I2510 Atherosclerotic heart disease of native coronary artery without angina pectoris: Secondary | ICD-10-CM

## 2021-02-21 DIAGNOSIS — I10 Essential (primary) hypertension: Secondary | ICD-10-CM

## 2021-02-22 LAB — BASIC METABOLIC PANEL
BUN/Creatinine Ratio: 19 (ref 10–24)
BUN: 20 mg/dL (ref 8–27)
CO2: 23 mmol/L (ref 20–29)
Calcium: 9.5 mg/dL (ref 8.6–10.2)
Chloride: 106 mmol/L (ref 96–106)
Creatinine, Ser: 1.03 mg/dL (ref 0.76–1.27)
Glucose: 103 mg/dL — ABNORMAL HIGH (ref 65–99)
Potassium: 4.6 mmol/L (ref 3.5–5.2)
Sodium: 142 mmol/L (ref 134–144)
eGFR: 74 mL/min/{1.73_m2} (ref >59)

## 2021-02-25 ENCOUNTER — Other Ambulatory Visit: Payer: Self-pay

## 2021-02-25 MED ORDER — OMEPRAZOLE 40 MG PO CPDR: 40.0000 mg | DELAYED_RELEASE_CAPSULE | Freq: Every day | ORAL | 3 refills | Status: DC

## 2021-02-25 NOTE — Progress Notes (Signed)
Called and spoke with patient regarding his recent lab results.

## 2021-03-26 ENCOUNTER — Other Ambulatory Visit: Payer: Self-pay

## 2021-03-26 MED ORDER — ROSUVASTATIN CALCIUM 40 MG PO TABS: 20.0000 mg | ORAL_TABLET | Freq: Every evening | ORAL | 2 refills | Status: DC

## 2021-03-29 DIAGNOSIS — G4733 Obstructive sleep apnea (adult) (pediatric): Secondary | ICD-10-CM | POA: Diagnosis not present

## 2021-05-24 ENCOUNTER — Other Ambulatory Visit: Payer: Self-pay

## 2021-05-24 NOTE — Telephone Encounter (Signed)
Refused prescription of Rosuvastatin 40 mg sent to [facility] as patient has been seeing another cardiologist and has no more appointments scheduled with Dr. Geraldo Pitter.

## 2021-06-20 ENCOUNTER — Telehealth: Payer: Self-pay

## 2021-06-20 NOTE — Telephone Encounter (Signed)
Request for Atorvastatin denied, per last message, patient is seeing another cardiologist and the request was denied then per Dr. Geraldo Pitter. Pharmacy notified via fax

## 2021-06-27 DIAGNOSIS — G4733 Obstructive sleep apnea (adult) (pediatric): Secondary | ICD-10-CM | POA: Diagnosis not present

## 2021-09-09 DIAGNOSIS — R350 Frequency of micturition: Secondary | ICD-10-CM | POA: Diagnosis not present

## 2021-09-09 DIAGNOSIS — N5201 Erectile dysfunction due to arterial insufficiency: Secondary | ICD-10-CM | POA: Diagnosis not present

## 2021-09-09 DIAGNOSIS — N401 Enlarged prostate with lower urinary tract symptoms: Secondary | ICD-10-CM | POA: Diagnosis not present

## 2021-09-10 DIAGNOSIS — G4733 Obstructive sleep apnea (adult) (pediatric): Secondary | ICD-10-CM | POA: Diagnosis not present

## 2021-09-26 DIAGNOSIS — G4733 Obstructive sleep apnea (adult) (pediatric): Secondary | ICD-10-CM | POA: Diagnosis not present

## 2021-11-14 ENCOUNTER — Telehealth: Payer: Self-pay

## 2021-11-14 NOTE — Telephone Encounter (Signed)
Refill request for Rosuvastatin 40 mg received from [facility] was denied per Revankar. Patient is followed by another provider.  ?Faxed denial to [facility] with instructions patient is followed by another provider Dr Geraldo Pitter did not prescribe. ?

## 2022-01-01 DIAGNOSIS — G4733 Obstructive sleep apnea (adult) (pediatric): Secondary | ICD-10-CM | POA: Diagnosis not present

## 2022-02-20 ENCOUNTER — Ambulatory Visit: Payer: Self-pay | Admitting: Cardiology

## 2022-02-20 ENCOUNTER — Encounter: Payer: Self-pay | Admitting: Cardiology

## 2022-02-20 VITALS — BP 144/76 | HR 62 | Temp 97.0°F | Resp 16 | Ht 69.0 in | Wt 218.0 lb

## 2022-02-20 DIAGNOSIS — I10 Essential (primary) hypertension: Secondary | ICD-10-CM | POA: Diagnosis not present

## 2022-02-20 DIAGNOSIS — I491 Atrial premature depolarization: Secondary | ICD-10-CM | POA: Diagnosis not present

## 2022-02-20 NOTE — Progress Notes (Signed)
Subjective:   Ralph Blackwell, male    DOB: 07/25/1940, 81 y.o.   MRN: [id number]   Chief complaint:  Coronary artery disease   HPI  81 year old Caucasian male with hypertension, hyperlipidemia, mild nonobstructive CAD  Patient is doing well.  He walks 2-3 miles 3-5 times a week without any chest pain, shortness of breath, palpitations, leg edema, orthopnea, PND, TIA/syncope. He recently returned from a cruise trip to [place], where he admits he nay have gained a few pounds with good food. Blood pressure is elevated today. It used to be lower the last time he checked, which was prior to his [place] trip. He has an upcoming appt with his PCP.    Current Outpatient Medications:    allopurinol (ZYLOPRIM) 300 MG tablet, Take 150 mg by mouth daily. , Disp: , Rfl:    ascorbic acid (VITAMIN C) 500 MG tablet, Take 500 mg by mouth daily., Disp: , Rfl:    Cholecalciferol (VITAMIN D3) 5000 units CAPS, Take 2,000 Units by mouth daily with supper. , Disp: , Rfl:    finasteride (PROSCAR) 5 MG tablet, Take 5 mg by mouth at bedtime. , Disp: , Rfl:    fluticasone (FLONASE) 50 MCG/ACT nasal spray, Place 1 spray into both nostrils every evening., Disp: , Rfl:    nitroGLYCERIN (NITROSTAT) 0.4 MG SL tablet, Place 0.4 mg under the tongue every 5 (five) minutes as needed for chest pain., Disp: , Rfl:    Omega-3 Fatty Acids (FISH OIL) 1000 MG CAPS, Take 1,000 mg by mouth daily., Disp: , Rfl:    omeprazole (PRILOSEC) 40 MG capsule, Take 1 capsule (40 mg total) by mouth daily., Disp: 90 capsule, Rfl: 3   rosuvastatin (CRESTOR) 40 MG tablet, Take 0.5 tablets (20 mg total) by mouth every evening., Disp: 90 tablet, Rfl: 2   telmisartan (MICARDIS) 80 MG tablet, Take 40 mg by mouth daily., Disp: , Rfl:    zinc gluconate 50 MG tablet, Take 50 mg by mouth daily., Disp: , Rfl:   Cardiovascular studies:  EKG 02/20/2022: Sinus rhythm 64 bpm Frequent PACs Low voltage in precordial leads  Abdominal Aortic Duplex   03/22/2020:  Diffuse plaque noted in the proximal, mid and distal aorta.  A distal abdominal aortic aneurysm measuring 2.61 x 2.6 x 2.67 cm is seen.  Normal bilateral internal iliac artery size and velocity. Recheck in 5  years for stability.   Coronary angiography 06/2017: Mild nonobstructive coronary artery disease (Prox LAD 30%) with sluggish flow in all vessels. Normal filling pressures. LVEF 55-65%   CT abdomen 2017: 1. Continued diverticulitis in the descending colon. A collection of fluid and gas along the medial aspect of the descending colon, at the site of diverticulitis, is consistent with an intramural abscess or contained perforation. This may explain the patient's resistance to treatment. 2. 10 mm lingular nodule. This appears to be associated with adjacent scarring. Recommend a three to six-month follow-up CT of the chest to ensure stability or resolution. 3. 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 The left common iliac artery is also prominent measuring 1.5 cm. These results will be called to the ordering clinician or representative by the Radiologist Assistant, and communication documented in the PACS or zVision Dashboard.    Recent labs: 11/06/2020: Glucose 100, BUN/Cr 24/1.6. EGFR 64. HbA1C N/A Chol 131, TG 100, HDL 51, LDL 62 TSH 1.6 normal  Review of Systems  Cardiovascular:  Negative for chest pain, dyspnea on  exertion, leg swelling, palpitations and syncope.         Vitals:   02/20/22 1041  BP: (!) 144/76  Pulse: 62  Resp: 16  Temp: (!) 97 F (36.1 C)  SpO2: 94%     Body mass index is 32.19 kg/m. Filed Weights   02/20/22 1041  Weight: 218 lb (98.9 kg)    Objective:    Physical Exam Vitals and nursing note reviewed.  Constitutional:      General: He is not in acute distress. Neck:     Vascular: No JVD.  Cardiovascular:     Rate and Rhythm: Normal rate and regular rhythm. Frequent Extrasystoles are  present.    Heart sounds: Normal heart sounds. No murmur heard. Pulmonary:     Effort: Pulmonary effort is normal.     Breath sounds: Normal breath sounds. No wheezing or rales.  Musculoskeletal:     Right lower leg: No edema.     Left lower leg: No edema.           Assessment & Recommendations:   81 year old Caucasian male with hypertension, hyperlipidemia, mild nonobstructive CAD   Hypertension: BP elevated today., Recommend regular walking, home checks. He will get this rechecked at upcoming office visit with his PCP in a few weeks. If SBP remains >140 mmHg, could add amlodipine 5 mg daily.  PAC: Frequent PACs on auscultation and EKG. Incidental finding. Will check echocardiogram. I have encouraged him to look for any alerts re: Afib on his smart watch.   Mixed hyperlipidemia: Well controlled on Crestor 40 mg daily.  F/u in 1 year   Nigel Mormon, MD [facility] Pager: [phone] Office: [phone] If no answer Cell [phone]

## 2022-02-26 DIAGNOSIS — K219 Gastro-esophageal reflux disease without esophagitis: Secondary | ICD-10-CM | POA: Diagnosis not present

## 2022-02-26 DIAGNOSIS — M109 Gout, unspecified: Secondary | ICD-10-CM | POA: Diagnosis not present

## 2022-02-26 DIAGNOSIS — R413 Other amnesia: Secondary | ICD-10-CM | POA: Diagnosis not present

## 2022-02-26 DIAGNOSIS — Z1331 Encounter for screening for depression: Secondary | ICD-10-CM | POA: Diagnosis not present

## 2022-02-26 DIAGNOSIS — G4710 Hypersomnia, unspecified: Secondary | ICD-10-CM | POA: Diagnosis not present

## 2022-02-26 DIAGNOSIS — Z0000 Encounter for general adult medical examination without abnormal findings: Secondary | ICD-10-CM | POA: Diagnosis not present

## 2022-02-26 DIAGNOSIS — I499 Cardiac arrhythmia, unspecified: Secondary | ICD-10-CM | POA: Diagnosis not present

## 2022-02-26 DIAGNOSIS — H9193 Unspecified hearing loss, bilateral: Secondary | ICD-10-CM | POA: Diagnosis not present

## 2022-02-26 DIAGNOSIS — E559 Vitamin D deficiency, unspecified: Secondary | ICD-10-CM | POA: Diagnosis not present

## 2022-02-26 DIAGNOSIS — G4733 Obstructive sleep apnea (adult) (pediatric): Secondary | ICD-10-CM | POA: Diagnosis not present

## 2022-02-26 DIAGNOSIS — E782 Mixed hyperlipidemia: Secondary | ICD-10-CM | POA: Diagnosis not present

## 2022-02-26 DIAGNOSIS — Z23 Encounter for immunization: Secondary | ICD-10-CM | POA: Diagnosis not present

## 2022-02-26 DIAGNOSIS — M179 Osteoarthritis of knee, unspecified: Secondary | ICD-10-CM | POA: Diagnosis not present

## 2022-02-26 DIAGNOSIS — I10 Essential (primary) hypertension: Secondary | ICD-10-CM | POA: Diagnosis not present

## 2022-02-26 DIAGNOSIS — N400 Enlarged prostate without lower urinary tract symptoms: Secondary | ICD-10-CM | POA: Diagnosis not present

## 2022-03-14 ENCOUNTER — Ambulatory Visit: Payer: Self-pay

## 2022-03-14 DIAGNOSIS — I10 Essential (primary) hypertension: Secondary | ICD-10-CM

## 2022-03-21 ENCOUNTER — Telehealth: Payer: Self-pay | Admitting: Cardiology

## 2022-03-21 NOTE — Telephone Encounter (Signed)
Patient calling to see if we received results. Please call him at number listed [phone]).

## 2022-03-21 NOTE — Telephone Encounter (Signed)
Spoke with patient regarding echocardiogram results.

## 2022-04-04 DIAGNOSIS — G4733 Obstructive sleep apnea (adult) (pediatric): Secondary | ICD-10-CM | POA: Diagnosis not present

## 2022-04-08 DIAGNOSIS — R0981 Nasal congestion: Secondary | ICD-10-CM | POA: Diagnosis not present

## 2022-04-08 DIAGNOSIS — R051 Acute cough: Secondary | ICD-10-CM | POA: Diagnosis not present

## 2022-04-17 ENCOUNTER — Encounter: Payer: Self-pay | Admitting: Cardiology

## 2022-04-17 ENCOUNTER — Ambulatory Visit: Payer: Self-pay | Admitting: Cardiology

## 2022-04-17 VITALS — BP 138/70 | HR 52 | Temp 98.0°F | Resp 16 | Ht 69.0 in | Wt 215.0 lb

## 2022-04-17 DIAGNOSIS — I10 Essential (primary) hypertension: Secondary | ICD-10-CM | POA: Diagnosis not present

## 2022-04-17 DIAGNOSIS — I491 Atrial premature depolarization: Secondary | ICD-10-CM

## 2022-04-17 DIAGNOSIS — E782 Mixed hyperlipidemia: Secondary | ICD-10-CM | POA: Insufficient documentation

## 2022-04-17 MED ORDER — EZETIMIBE 10 MG PO TABS: 10.0000 mg | ORAL_TABLET | Freq: Every day | ORAL | 3 refills | Status: DC

## 2022-04-17 NOTE — Progress Notes (Signed)
Subjective:   Ralph Blackwell, male    DOB: 12-Jan-1941, 81 y.o.   MRN: [id number]   Chief complaint:  Coronary artery disease   HPI  81 year old Caucasian male with hypertension, hyperlipidemia, mild nonobstructive CAD  Patient denies chest pain, shortness of breath, but reports days where he feels very fatigued and tired. Reviewed recent test results with the patient, details below. He uses CPAP regularly.     Current Outpatient Medications:    allopurinol (ZYLOPRIM) 300 MG tablet, Take 150 mg by mouth daily. , Disp: , Rfl:    ascorbic acid (VITAMIN C) 500 MG tablet, Take 500 mg by mouth daily., Disp: , Rfl:    Cholecalciferol (VITAMIN D3) 5000 units CAPS, Take 2,000 Units by mouth daily with supper. , Disp: , Rfl:    finasteride (PROSCAR) 5 MG tablet, Take 5 mg by mouth at bedtime. , Disp: , Rfl:    fluticasone (FLONASE) 50 MCG/ACT nasal spray, Place 1 spray into both nostrils every evening., Disp: , Rfl:    nitroGLYCERIN (NITROSTAT) 0.4 MG SL tablet, Place 0.4 mg under the tongue every 5 (five) minutes as needed for chest pain., Disp: , Rfl:    Omega-3 Fatty Acids (FISH OIL) 1000 MG CAPS, Take 1,000 mg by mouth daily., Disp: , Rfl:    omeprazole (PRILOSEC) 40 MG capsule, Take 1 capsule (40 mg total) by mouth daily., Disp: 90 capsule, Rfl: 3   rosuvastatin (CRESTOR) 40 MG tablet, Take 0.5 tablets (20 mg total) by mouth every evening., Disp: 90 tablet, Rfl: 2   telmisartan (MICARDIS) 80 MG tablet, Take 40 mg by mouth daily., Disp: , Rfl:    zinc gluconate 50 MG tablet, Take 50 mg by mouth daily., Disp: , Rfl:   Cardiovascular studies:  Echocardiogram 03/14/2022:  Normal LV systolic function with visual EF 55-60%. Left ventricle cavity  is normal in size. Normal global wall motion. Normal diastolic filling  pattern, normal LAP. Calculated EF 57%.  Trileaflet aortic valve.  Moderate to severe aortic regurgitation. Mild  aortic valve leaflet calcification.  Mild tricuspid  regurgitation. No evidence of pulmonary hypertension.  Mild pulmonic regurgitation.  No prior available for comparison.  I personally reviewed the echocardiogram. There is some eccentricity to the aortic regurgitation, but I think the severity is mild to moderate.   EKG 02/20/2022: Sinus rhythm 64 bpm Frequent PACs Low voltage in precordial leads  Abdominal Aortic Duplex  03/22/2020:  Diffuse plaque noted in the proximal, mid and distal aorta.  A distal abdominal aortic aneurysm measuring 2.61 x 2.6 x 2.67 cm is seen.  Normal bilateral internal iliac artery size and velocity. Recheck in 5  years for stability.   Coronary angiography 06/2017: Mild nonobstructive coronary artery disease (Prox LAD 30%) with sluggish flow in all vessels. Normal filling pressures. LVEF 55-65%   CT abdomen 2017: 1. Continued diverticulitis in the descending colon. A collection of fluid and gas along the medial aspect of the descending colon, at the site of diverticulitis, is consistent with an intramural abscess or contained perforation. This may explain the patient's resistance to treatment. 2. 10 mm lingular nodule. This appears to be associated with adjacent scarring. Recommend a three to six-month follow-up CT of the chest to ensure stability or resolution. 3. 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 The left common iliac artery is also prominent measuring 1.5 cm. These results will be called to the ordering clinician or representative by the Radiologist Assistant, and  communication documented in the PACS or zVision Dashboard.    Recent labs: 02/26/2022: Glucose 98, BUN/Cr 28/1.0. EGFR 70. K 4.5. Chol 127, TG 116, HDL 47, LDL 59 TSH 1.5 normal  11/06/2020: Glucose 100, BUN/Cr 24/1.6. EGFR 64. HbA1C N/A Chol 131, TG 100, HDL 51, LDL 62 TSH 1.6 normal  Review of Systems  Cardiovascular:  Negative for chest pain, dyspnea on exertion, leg swelling, palpitations  and syncope.         Vitals:   04/17/22 1037  BP: 138/70  Pulse: (!) 52  Resp: 16  Temp: 98 F (36.7 C)  SpO2: 95%     Body mass index is 31.75 kg/m. Filed Weights   04/17/22 1037  Weight: 215 lb (97.5 kg)    Objective:    Physical Exam Vitals and nursing note reviewed.  Constitutional:      General: He is not in acute distress. Neck:     Vascular: No JVD.  Cardiovascular:     Rate and Rhythm: Normal rate and regular rhythm. Frequent Extrasystoles are present.    Heart sounds: Normal heart sounds. No murmur heard. Pulmonary:     Effort: Pulmonary effort is normal.     Breath sounds: Normal breath sounds. No wheezing or rales.  Musculoskeletal:     Right lower leg: No edema.     Left lower leg: No edema.           Assessment & Recommendations:   81 year old Caucasian male with hypertension, hyperlipidemia, mild nonobstructive CAD  Hypertension: Better controlled. No change made today.  Fatigue: I am unclear of the cause of his symptoms. That said, I do not see any obvious pathology. Will see if coming off statin would help.   Aortic regurgitation:  I personally reviewed the echocardiogram. There is some eccentricity to the aortic regurgitation, but I think the severity is mild to moderate.  Will repeat in 6 months (Will order at next visit).  PAC: Frequent PACs on auscultation and EKG. Incidental finding.  Monitor for now.  Mixed hyperlipidemia: Well controlled on Crestor 20 mg daily. However, he has had recent memory issues that seemed to improve after stopping Crestor.  Will change Crestor to Zetia.  F/u in 4-6 weeks    Nigel Mormon, MD Pager: [phone] Office: [phone]

## 2022-04-20 ENCOUNTER — Other Ambulatory Visit: Payer: Self-pay | Admitting: Cardiology

## 2022-04-26 DIAGNOSIS — Z23 Encounter for immunization: Secondary | ICD-10-CM | POA: Diagnosis not present

## 2022-06-30 ENCOUNTER — Encounter: Payer: Self-pay | Admitting: Cardiology

## 2022-06-30 ENCOUNTER — Ambulatory Visit: Payer: Self-pay | Admitting: Cardiology

## 2022-06-30 VITALS — BP 148/74 | HR 70 | Resp 16 | Ht 69.0 in | Wt 222.0 lb

## 2022-06-30 DIAGNOSIS — I351 Nonrheumatic aortic (valve) insufficiency: Secondary | ICD-10-CM

## 2022-06-30 DIAGNOSIS — I10 Essential (primary) hypertension: Secondary | ICD-10-CM | POA: Diagnosis not present

## 2022-06-30 DIAGNOSIS — E782 Mixed hyperlipidemia: Secondary | ICD-10-CM

## 2022-06-30 MED ORDER — ROSUVASTATIN CALCIUM 20 MG PO TABS: 20.0000 mg | ORAL_TABLET | Freq: Every day | ORAL | 3 refills | Status: DC

## 2022-06-30 NOTE — Progress Notes (Signed)
Subjective:   Ralph Blackwell, male    DOB: 02-13-1941, 81 y.o.   MRN: [id number]   Chief complaint:  Coronary artery disease   HPI  81 year old Caucasian male with hypertension, hyperlipidemia, mild nonobstructive CAD  No angina symptoms. Nonspecific fatigue symptoms did not improve after stopping Crestor, which was previously controlling lipids well. Recently. He has gained weight and has been relatively sedentary.    Current Outpatient Medications:    allopurinol (ZYLOPRIM) 300 MG tablet, Take 150 mg by mouth daily. , Disp: , Rfl:    ascorbic acid (VITAMIN C) 500 MG tablet, Take 500 mg by mouth daily., Disp: , Rfl:    Cholecalciferol (VITAMIN D3) 5000 units CAPS, Take 2,000 Units by mouth daily with supper. , Disp: , Rfl:    ezetimibe (ZETIA) 10 MG tablet, Take 1 tablet (10 mg total) by mouth daily., Disp: 30 tablet, Rfl: 3   finasteride (PROSCAR) 5 MG tablet, Take 5 mg by mouth at bedtime. , Disp: , Rfl:    fluticasone (FLONASE) 50 MCG/ACT nasal spray, Place 1 spray into both nostrils every evening., Disp: , Rfl:    nitroGLYCERIN (NITROSTAT) 0.4 MG SL tablet, Place 0.4 mg under the tongue every 5 (five) minutes as needed for chest pain., Disp: , Rfl:    Omega-3 Fatty Acids (FISH OIL) 1000 MG CAPS, Take 1,000 mg by mouth daily., Disp: , Rfl:    omeprazole (PRILOSEC) 40 MG capsule, Take 1 capsule (40 mg total) by mouth daily., Disp: 90 capsule, Rfl: 3   telmisartan (MICARDIS) 80 MG tablet, Take 40 mg by mouth daily., Disp: , Rfl:    zinc gluconate 50 MG tablet, Take 50 mg by mouth daily., Disp: , Rfl:   Cardiovascular studies:  Echocardiogram 03/14/2022:  Normal LV systolic function with visual EF 55-60%. Left ventricle cavity  is normal in size. Normal global wall motion. Normal diastolic filling  pattern, normal LAP. Calculated EF 57%.  Trileaflet aortic valve.  Moderate to severe aortic regurgitation. Mild  aortic valve leaflet calcification.  Mild tricuspid  regurgitation. No evidence of pulmonary hypertension.  Mild pulmonic regurgitation.  No prior available for comparison.  I personally reviewed the echocardiogram. There is some eccentricity to the aortic regurgitation, but I think the severity is mild to moderate.   EKG 02/20/2022: Sinus rhythm 64 bpm Frequent PACs Low voltage in precordial leads  Abdominal Aortic Duplex  03/22/2020:  Diffuse plaque noted in the proximal, mid and distal aorta.  A distal abdominal aortic aneurysm measuring 2.61 x 2.6 x 2.67 cm is seen.  Normal bilateral internal iliac artery size and velocity. Recheck in 5  years for stability.   Coronary angiography 06/2017: Mild nonobstructive coronary artery disease (Prox LAD 30%) with sluggish flow in all vessels. Normal filling pressures. LVEF 55-65%   CT abdomen 2017: 1. Continued diverticulitis in the descending colon. A collection of fluid and gas along the medial aspect of the descending colon, at the site of diverticulitis, is consistent with an intramural abscess or contained perforation. This may explain the patient's resistance to treatment. 2. 10 mm lingular nodule. This appears to be associated with adjacent scarring. Recommend a three to six-month follow-up CT of the chest to ensure stability or resolution. 3. 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 The left common iliac artery is also prominent measuring 1.5 cm. These results will be called to the ordering clinician or representative by the Radiologist Assistant, and communication documented in the  PACS or zVision Dashboard.    Recent labs: 02/26/2022: Glucose 98, BUN/Cr 28/1.0. EGFR 70. K 4.5. Chol 127, TG 116, HDL 47, LDL 59 TSH 1.5 normal  11/06/2020: Glucose 100, BUN/Cr 24/1.6. EGFR 64. HbA1C N/A Chol 131, TG 100, HDL 51, LDL 62 TSH 1.6 normal  Review of Systems  Cardiovascular:  Negative for chest pain, dyspnea on exertion, leg swelling, palpitations  and syncope.         Vitals:   06/30/22 1334  BP: (!) 148/74  Pulse: 70  Resp: 16  SpO2: 94%     Body mass index is 32.78 kg/m. Filed Weights   06/30/22 1334  Weight: 222 lb (100.7 kg)    Objective:    Physical Exam Vitals and nursing note reviewed.  Constitutional:      General: He is not in acute distress. Neck:     Vascular: No JVD.  Cardiovascular:     Rate and Rhythm: Normal rate and regular rhythm.     Heart sounds: Normal heart sounds. No murmur heard. Pulmonary:     Effort: Pulmonary effort is normal.     Breath sounds: Normal breath sounds. No wheezing or rales.  Musculoskeletal:     Right lower leg: No edema.     Left lower leg: No edema.           Assessment & Recommendations:   81 year old Caucasian male with hypertension, hyperlipidemia, mild nonobstructive CAD  Hypertension: BP elevated today. Encouraged regular home BP checks, which are reportedly lower. No change made today. If SBP consistently remains >140 mmHg, consider adding amlodipine 5   Fatigue: Nonspecific. Multifactorial with age, weight gain, relative inactivity.  Resume Crestor back, as stopping Crestor did not make any change,   Aortic regurgitation:  I personally reviewed the echocardiogram. There is some eccentricity to the aortic regurgitation, but I think the severity is mild to moderate.  Will repeat in 09/2022.  PAC: Frequent PACs on auscultation and EKG. Incidental finding.  Monitor for now.  Mixed hyperlipidemia: Resume Crestor 20 mg daily.  F/u in 10/2022    Nigel Mormon, MD Pager: [phone] Office: [phone]

## 2022-07-02 ENCOUNTER — Ambulatory Visit: Payer: Self-pay | Admitting: Cardiology

## 2022-07-03 DIAGNOSIS — J029 Acute pharyngitis, unspecified: Secondary | ICD-10-CM | POA: Diagnosis not present

## 2022-07-03 DIAGNOSIS — R509 Fever, unspecified: Secondary | ICD-10-CM | POA: Diagnosis not present

## 2022-07-03 DIAGNOSIS — R051 Acute cough: Secondary | ICD-10-CM | POA: Diagnosis not present

## 2022-07-03 DIAGNOSIS — R0981 Nasal congestion: Secondary | ICD-10-CM | POA: Diagnosis not present

## 2022-07-09 DIAGNOSIS — G4733 Obstructive sleep apnea (adult) (pediatric): Secondary | ICD-10-CM | POA: Diagnosis not present

## 2022-07-27 ENCOUNTER — Other Ambulatory Visit: Payer: Self-pay | Admitting: Cardiology

## 2022-08-11 ENCOUNTER — Other Ambulatory Visit: Payer: Self-pay | Admitting: Cardiology

## 2022-08-11 NOTE — Telephone Encounter (Signed)
Called patient to clarify if he is taking crestor or zetia per MP's request before filling this medicaiton

## 2022-08-11 NOTE — Telephone Encounter (Signed)
Patient is taking crestor

## 2022-09-09 DIAGNOSIS — G4733 Obstructive sleep apnea (adult) (pediatric): Secondary | ICD-10-CM | POA: Diagnosis not present

## 2022-09-16 DIAGNOSIS — J0100 Acute maxillary sinusitis, unspecified: Secondary | ICD-10-CM | POA: Diagnosis not present

## 2022-09-16 DIAGNOSIS — J029 Acute pharyngitis, unspecified: Secondary | ICD-10-CM | POA: Diagnosis not present

## 2022-09-16 DIAGNOSIS — J209 Acute bronchitis, unspecified: Secondary | ICD-10-CM | POA: Diagnosis not present

## 2022-09-23 ENCOUNTER — Other Ambulatory Visit: Payer: PPO

## 2022-10-07 DIAGNOSIS — G4733 Obstructive sleep apnea (adult) (pediatric): Secondary | ICD-10-CM | POA: Diagnosis not present

## 2022-10-08 ENCOUNTER — Ambulatory Visit: Payer: PPO

## 2022-10-08 DIAGNOSIS — I351 Nonrheumatic aortic (valve) insufficiency: Secondary | ICD-10-CM | POA: Diagnosis not present

## 2022-10-08 DIAGNOSIS — I10 Essential (primary) hypertension: Secondary | ICD-10-CM | POA: Diagnosis not present

## 2022-10-11 ENCOUNTER — Emergency Department (HOSPITAL_COMMUNITY): Payer: PPO

## 2022-10-11 ENCOUNTER — Encounter (HOSPITAL_COMMUNITY): Payer: Self-pay

## 2022-10-11 ENCOUNTER — Other Ambulatory Visit: Payer: Self-pay

## 2022-10-11 ENCOUNTER — Inpatient Hospital Stay (HOSPITAL_COMMUNITY)
Admission: EM | Admit: 2022-10-11 | Discharge: 2022-10-27 | DRG: 252 | Disposition: A | Discharge status: Home Health | Payer: PPO | Source: Other Acute Inpatient Hospital | Attending: Family Medicine | Admitting: Family Medicine

## 2022-10-11 DIAGNOSIS — M79A11 Nontraumatic compartment syndrome of right upper extremity: Secondary | ICD-10-CM | POA: Diagnosis not present

## 2022-10-11 DIAGNOSIS — I480 Paroxysmal atrial fibrillation: Secondary | ICD-10-CM | POA: Diagnosis present

## 2022-10-11 DIAGNOSIS — R338 Other retention of urine: Secondary | ICD-10-CM | POA: Diagnosis present

## 2022-10-11 DIAGNOSIS — E876 Hypokalemia: Secondary | ICD-10-CM | POA: Diagnosis not present

## 2022-10-11 DIAGNOSIS — D696 Thrombocytopenia, unspecified: Secondary | ICD-10-CM | POA: Diagnosis present

## 2022-10-11 DIAGNOSIS — D62 Acute posthemorrhagic anemia: Secondary | ICD-10-CM | POA: Diagnosis not present

## 2022-10-11 DIAGNOSIS — K5900 Constipation, unspecified: Secondary | ICD-10-CM | POA: Diagnosis present

## 2022-10-11 DIAGNOSIS — S5010XA Contusion of unspecified forearm, initial encounter: Secondary | ICD-10-CM | POA: Diagnosis not present

## 2022-10-11 DIAGNOSIS — I5032 Chronic diastolic (congestive) heart failure: Secondary | ICD-10-CM | POA: Diagnosis not present

## 2022-10-11 DIAGNOSIS — Y840 Cardiac catheterization as the cause of abnormal reaction of the patient, or of later complication, without mention of misadventure at the time of the procedure: Secondary | ICD-10-CM | POA: Diagnosis not present

## 2022-10-11 DIAGNOSIS — I2694 Multiple subsegmental pulmonary emboli without acute cor pulmonale: Secondary | ICD-10-CM | POA: Diagnosis not present

## 2022-10-11 DIAGNOSIS — M109 Gout, unspecified: Secondary | ICD-10-CM | POA: Diagnosis present

## 2022-10-11 DIAGNOSIS — Z79899 Other long term (current) drug therapy: Secondary | ICD-10-CM | POA: Diagnosis not present

## 2022-10-11 DIAGNOSIS — I200 Unstable angina: Secondary | ICD-10-CM | POA: Diagnosis present

## 2022-10-11 DIAGNOSIS — I82431 Acute embolism and thrombosis of right popliteal vein: Secondary | ICD-10-CM | POA: Diagnosis present

## 2022-10-11 DIAGNOSIS — I2692 Saddle embolus of pulmonary artery without acute cor pulmonale: Secondary | ICD-10-CM | POA: Diagnosis not present

## 2022-10-11 DIAGNOSIS — E871 Hypo-osmolality and hyponatremia: Secondary | ICD-10-CM | POA: Diagnosis present

## 2022-10-11 DIAGNOSIS — L7682 Other postprocedural complications of skin and subcutaneous tissue: Secondary | ICD-10-CM | POA: Diagnosis not present

## 2022-10-11 DIAGNOSIS — L7632 Postprocedural hematoma of skin and subcutaneous tissue following other procedure: Secondary | ICD-10-CM | POA: Diagnosis not present

## 2022-10-11 DIAGNOSIS — J9601 Acute respiratory failure with hypoxia: Secondary | ICD-10-CM | POA: Diagnosis present

## 2022-10-11 DIAGNOSIS — N401 Enlarged prostate with lower urinary tract symptoms: Secondary | ICD-10-CM | POA: Diagnosis present

## 2022-10-11 DIAGNOSIS — I10 Essential (primary) hypertension: Secondary | ICD-10-CM | POA: Diagnosis present

## 2022-10-11 DIAGNOSIS — R739 Hyperglycemia, unspecified: Secondary | ICD-10-CM | POA: Diagnosis present

## 2022-10-11 DIAGNOSIS — I2602 Saddle embolus of pulmonary artery with acute cor pulmonale: Secondary | ICD-10-CM | POA: Diagnosis present

## 2022-10-11 DIAGNOSIS — I721 Aneurysm of artery of upper extremity: Secondary | ICD-10-CM | POA: Diagnosis not present

## 2022-10-11 DIAGNOSIS — Z8249 Family history of ischemic heart disease and other diseases of the circulatory system: Secondary | ICD-10-CM

## 2022-10-11 DIAGNOSIS — I509 Heart failure, unspecified: Secondary | ICD-10-CM | POA: Diagnosis not present

## 2022-10-11 DIAGNOSIS — G5601 Carpal tunnel syndrome, right upper limb: Secondary | ICD-10-CM | POA: Diagnosis present

## 2022-10-11 DIAGNOSIS — I82461 Acute embolism and thrombosis of right calf muscular vein: Secondary | ICD-10-CM | POA: Diagnosis present

## 2022-10-11 DIAGNOSIS — I25110 Atherosclerotic heart disease of native coronary artery with unstable angina pectoris: Secondary | ICD-10-CM | POA: Diagnosis present

## 2022-10-11 DIAGNOSIS — E782 Mixed hyperlipidemia: Secondary | ICD-10-CM | POA: Diagnosis present

## 2022-10-11 DIAGNOSIS — E875 Hyperkalemia: Secondary | ICD-10-CM | POA: Diagnosis not present

## 2022-10-11 DIAGNOSIS — I272 Pulmonary hypertension, unspecified: Secondary | ICD-10-CM | POA: Diagnosis not present

## 2022-10-11 DIAGNOSIS — N289 Disorder of kidney and ureter, unspecified: Secondary | ICD-10-CM | POA: Diagnosis not present

## 2022-10-11 DIAGNOSIS — G4730 Sleep apnea, unspecified: Secondary | ICD-10-CM | POA: Diagnosis not present

## 2022-10-11 DIAGNOSIS — T79A11A Traumatic compartment syndrome of right upper extremity, initial encounter: Secondary | ICD-10-CM

## 2022-10-11 DIAGNOSIS — I440 Atrioventricular block, first degree: Secondary | ICD-10-CM | POA: Diagnosis present

## 2022-10-11 DIAGNOSIS — I2699 Other pulmonary embolism without acute cor pulmonale: Secondary | ICD-10-CM | POA: Diagnosis not present

## 2022-10-11 DIAGNOSIS — Z452 Encounter for adjustment and management of vascular access device: Secondary | ICD-10-CM | POA: Diagnosis not present

## 2022-10-11 DIAGNOSIS — Z86711 Personal history of pulmonary embolism: Secondary | ICD-10-CM

## 2022-10-11 DIAGNOSIS — R079 Chest pain, unspecified: Secondary | ICD-10-CM | POA: Diagnosis not present

## 2022-10-11 DIAGNOSIS — Z96651 Presence of right artificial knee joint: Secondary | ICD-10-CM | POA: Diagnosis present

## 2022-10-11 DIAGNOSIS — K219 Gastro-esophageal reflux disease without esophagitis: Secondary | ICD-10-CM | POA: Diagnosis present

## 2022-10-11 DIAGNOSIS — T8189XA Other complications of procedures, not elsewhere classified, initial encounter: Secondary | ICD-10-CM | POA: Diagnosis not present

## 2022-10-11 DIAGNOSIS — I2609 Other pulmonary embolism with acute cor pulmonale: Secondary | ICD-10-CM | POA: Diagnosis not present

## 2022-10-11 DIAGNOSIS — T81718A Complication of other artery following a procedure, not elsewhere classified, initial encounter: Secondary | ICD-10-CM | POA: Diagnosis not present

## 2022-10-11 DIAGNOSIS — I214 Non-ST elevation (NSTEMI) myocardial infarction: Secondary | ICD-10-CM | POA: Diagnosis not present

## 2022-10-11 DIAGNOSIS — D6959 Other secondary thrombocytopenia: Secondary | ICD-10-CM | POA: Diagnosis present

## 2022-10-11 DIAGNOSIS — I2089 Other forms of angina pectoris: Secondary | ICD-10-CM | POA: Diagnosis present

## 2022-10-11 DIAGNOSIS — S51801A Unspecified open wound of right forearm, initial encounter: Secondary | ICD-10-CM | POA: Diagnosis not present

## 2022-10-11 DIAGNOSIS — I110 Hypertensive heart disease with heart failure: Secondary | ICD-10-CM | POA: Diagnosis not present

## 2022-10-11 DIAGNOSIS — E785 Hyperlipidemia, unspecified: Secondary | ICD-10-CM | POA: Diagnosis not present

## 2022-10-11 DIAGNOSIS — I97630 Postprocedural hematoma of a circulatory system organ or structure following a cardiac catheterization: Secondary | ICD-10-CM | POA: Diagnosis not present

## 2022-10-11 DIAGNOSIS — Z0389 Encounter for observation for other suspected diseases and conditions ruled out: Secondary | ICD-10-CM | POA: Diagnosis not present

## 2022-10-11 DIAGNOSIS — D649 Anemia, unspecified: Secondary | ICD-10-CM | POA: Diagnosis not present

## 2022-10-11 LAB — COMPREHENSIVE METABOLIC PANEL
ALT: 24 U/L (ref 0–44)
AST: 27 U/L (ref 15–41)
Albumin: 3.4 g/dL — ABNORMAL LOW (ref 3.5–5.0)
Alkaline Phosphatase: 64 U/L (ref 38–126)
Anion gap: 9 (ref 5–15)
BUN: 26 mg/dL — ABNORMAL HIGH (ref 8–23)
CO2: 25 mmol/L (ref 22–32)
Calcium: 8.9 mg/dL (ref 8.9–10.3)
Chloride: 105 mmol/L (ref 98–111)
Creatinine, Ser: 1.21 mg/dL (ref 0.61–1.24)
GFR, Estimated: >60 mL/min (ref >60)
Glucose, Bld: 88 mg/dL (ref 70–99)
Potassium: 4.4 mmol/L (ref 3.5–5.1)
Sodium: 139 mmol/L (ref 135–145)
Total Bilirubin: 0.9 mg/dL (ref 0.3–1.2)
Total Protein: 6.4 g/dL — ABNORMAL LOW (ref 6.5–8.1)

## 2022-10-11 LAB — CBC
HCT: 41.7 % (ref 39.0–52.0)
Hemoglobin: 14 g/dL (ref 13.0–17.0)
MCH: 30.8 pg (ref 26.0–34.0)
MCHC: 33.6 g/dL (ref 30.0–36.0)
MCV: 91.6 fL (ref 80.0–100.0)
Platelets: 89 10*3/uL — ABNORMAL LOW (ref 150–400)
RBC: 4.55 MIL/uL (ref 4.22–5.81)
RDW: 14.7 % (ref 11.5–15.5)
WBC: 7.4 10*3/uL (ref 4.0–10.5)
nRBC: 0 % (ref 0.0–0.2)

## 2022-10-11 LAB — TROPONIN I (HIGH SENSITIVITY)
Troponin I (High Sensitivity): 22 ng/L — ABNORMAL HIGH (ref <18)
Troponin I (High Sensitivity): 24 ng/L — ABNORMAL HIGH (ref <18)

## 2022-10-11 MED ORDER — HEPARIN (PORCINE) 25000 UT/250ML-% IV SOLN
1200.0000 [IU]/h | INTRAVENOUS | Status: DC
  Administered 2022-10-11 – 2022-10-15 (×5): 1200 [IU]/h via INTRAVENOUS
  Filled 2022-10-11 (×5): qty 250

## 2022-10-11 MED ORDER — ASPIRIN 81 MG PO TBEC
81.0000 mg | DELAYED_RELEASE_TABLET | Freq: Every day | ORAL | Status: DC
  Administered 2022-10-11 – 2022-10-27 (×13): 81 mg via ORAL
  Filled 2022-10-11 (×14): qty 1

## 2022-10-11 MED ORDER — ACETAMINOPHEN 325 MG PO TABS
650.0000 mg | ORAL_TABLET | ORAL | Status: DC | PRN
  Administered 2022-10-13 – 2022-10-27 (×19): 650 mg via ORAL
  Filled 2022-10-11 (×19): qty 2

## 2022-10-11 MED ORDER — ROSUVASTATIN CALCIUM 20 MG PO TABS
20.0000 mg | ORAL_TABLET | Freq: Every day | ORAL | Status: DC
  Administered 2022-10-11: 20 mg via ORAL
  Filled 2022-10-11: qty 1

## 2022-10-11 MED ORDER — NITROGLYCERIN IN D5W 200-5 MCG/ML-% IV SOLN
0.0000 ug/min | INTRAVENOUS | Status: DC
  Administered 2022-10-11: 5 ug/min via INTRAVENOUS
  Filled 2022-10-11: qty 250

## 2022-10-11 MED ORDER — ONDANSETRON HCL 4 MG/2ML IJ SOLN
4.0000 mg | Freq: Four times a day (QID) | INTRAMUSCULAR | Status: DC | PRN
  Administered 2022-10-14 – 2022-10-15 (×2): 4 mg via INTRAVENOUS
  Filled 2022-10-11: qty 2

## 2022-10-11 MED ORDER — NITROGLYCERIN 2 % TD OINT
0.5000 [in_us] | TOPICAL_OINTMENT | Freq: Once | TRANSDERMAL | Status: AC
  Administered 2022-10-11: 0.5 [in_us] via TOPICAL
  Filled 2022-10-11: qty 1

## 2022-10-11 NOTE — Assessment & Plan Note (Signed)
Hold home ARB On NTG GTT for the moment

## 2022-10-11 NOTE — Progress Notes (Signed)
Patient's wife called [facility] paging service to let us know that she was concerned her husband was having a heart attack. He was very diaphoretic with chest pain and shortness of breath so wife called EMS. She said she "thought she was going to lose him before they got to the hospital" given the severity of his symptoms. Chest pain began when walking and radiated to both shoulders and arms. Wife gave him SL nitro before EMS arrived and that helped a little. He received full dose ASA and patient was stable while en route to hospital.   Nitro paste in ED has helped his chest pain even more. There are no dynamic EKG changes on telemetry or on serial EKGs as of now. Patient is hemodynamically stable with slight troponin elevation likely indicative of unstable agnina. We will start heparin and nitro gtt. Continue his statin. Echocardiogram has been ordered.   I have discussed the case with Dr Virgina Jock, we will plan to take him to cath lab on Monday morning pending he remains hemodynamically stable and no acute EKG changes. Discussed with ED physician, patient to be admitted to hospitalist service with [place] on consult. I have discussed the plan again with patient's wife and she is comfortable proceeding.   Continue on telemetry. Trend troponins. Please page cardiology with any changes to patient's clinical status.    Floydene Flock, DO 10/11/22 8:42 PM Cell: [phone]

## 2022-10-11 NOTE — H&P (Signed)
History and Physical    Patient: Ralph Blackwell [id number] DOB: Mar 30, 1941 DOA: 10/11/2022 DOS: the patient was seen and examined on 10/11/2022 PCP: Corliss Blacker, MD  Patient coming from: Home  Chief Complaint:  Chief Complaint  Patient presents with   Chest Pain   HPI: Ralph Blackwell is a 82 y.o. male with medical history significant of mild non-occlusive CAD, HTN, provoked PE in setting of surgery.  Pt with DOE over past several weeks.  Today CP and SOB while ambulating.  Improved with SL NTG.  CP radiation to both arms.  EMS called, pt in to ED.  No other recent illness, fever.    Review of Systems: As mentioned in the history of present illness. All other systems reviewed and are negative. Past Medical History:  Diagnosis Date   Coronary artery disease    GERD (gastroesophageal reflux disease)    Gout    History of hiatal hernia    Hypertension    Pulmonary embolism ([place]) 2008 after knee surgey   Renal insufficiency    hx renal failure 2008 after blood clots   Sleep apnea    Past Surgical History:  Procedure Laterality Date   arthroscopic knee surgery  2004   BACK SURGERY  nov 1979 and 1998   lower   COLONOSCOPY WITH PROPOFOL N/A 10/20/2016   Procedure: COLONOSCOPY WITH PROPOFOL;  Surgeon: Garlan Fair, MD;  Location: [facility];  Service: Endoscopy;  Laterality: N/A;   JOINT REPLACEMENT Left 2007   knee    LEFT HEART CATH AND CORONARY ANGIOGRAPHY N/A 06/30/2017   Procedure: LEFT HEART CATH AND CORONARY ANGIOGRAPHY;  Surgeon: Nigel Mormon, MD;  Location: [facility];  Service: Cardiovascular;  Laterality: N/A;   left wrist plating   2005   titanium plate   right total knee replacement Right 2009   Social History:  reports that he has never smoked. He has never used smokeless tobacco. He reports that he does not drink alcohol and does not use drugs.  No Known Allergies  Family History  Problem Relation Age of Onset   Heart  disease Mother    Hypertension Mother    Heart disease Father    Heart attack Father    Hypertension Father     Prior to Admission medications   Medication Sig Start Date End Date Taking? Authorizing Provider  allopurinol (ZYLOPRIM) 300 MG tablet Take 150 mg by mouth daily.     [provider]  ascorbic acid (VITAMIN C) 500 MG tablet Take 500 mg by mouth daily.    [provider]  Cholecalciferol (VITAMIN D3) 5000 units CAPS Take 2,000 Units by mouth daily with supper.     [provider]  finasteride (PROSCAR) 5 MG tablet Take 5 mg by mouth at bedtime.     [provider]  fluticasone (FLONASE) 50 MCG/ACT nasal spray Place 1 spray into both nostrils every evening.    [provider]  magnesium gluconate (MAGONATE) 500 MG tablet Take 500 mg by mouth 2 (two) times daily.    [provider]  nitroGLYCERIN (NITROSTAT) 0.4 MG SL tablet Place 0.4 mg under the tongue every 5 (five) minutes as needed for chest pain.    [provider]  Omega-3 Fatty Acids (FISH OIL) 1000 MG CAPS Take 1,000 mg by mouth daily.    [provider]  omeprazole (PRILOSEC) 40 MG capsule Take 1 capsule (40 mg total) by mouth daily. 04/21/22  Patwardhan, Manish J, MD  rosuvastatin (CRESTOR) 20 MG tablet Take 1 tablet (20 mg total) by mouth daily. 06/30/22 06/25/23  Patwardhan, Reynold Bowen, MD  telmisartan (MICARDIS) 80 MG tablet Take 40 mg by mouth daily.    [provider]  zinc gluconate 50 MG tablet Take 50 mg by mouth daily.    [provider]    Physical Exam: Vitals:   10/11/22 1815 10/11/22 1821 10/11/22 1927 10/11/22 1928  BP: 138/80  115/75   Pulse: 87  83 83  Resp: (!) 22  13 13   Temp:      TempSrc:      SpO2: 97%  96% 96%  Weight:  93 kg    Height:  5' 9.5" (1.765 m)     Constitutional: NAD, calm, comfortable Respiratory: clear to auscultation bilaterally, no wheezing, no crackles. Normal respiratory effort. No  accessory muscle use.  Cardiovascular: Regular rate and rhythm, no murmurs / rubs / gallops. No extremity edema. 2+ pedal pulses. No carotid bruits.  Abdomen: no tenderness, no masses palpated. No hepatosplenomegaly. Bowel sounds positive.  Neurologic: CN 2-12 grossly intact. Sensation intact, DTR normal. Strength 5/5 in all 4.  Psychiatric: Normal judgment and insight. Alert and oriented x 3. Normal mood.   Data Reviewed: {Tip this will not be part of the note when signed- Document your independent interpretation of telemetry tracing, EKG, lab, Radiology test or any other diagnostic tests. Add any new diagnostic test ordered today. (Optional):[id number]}   First trop 22     Latest Ref Rng & Units 10/11/2022    6:23 PM 01/25/2013    4:08 PM 01/07/2008    4:55 AM  CBC  WBC 4.0 - 10.5 K/uL 7.4  6.4  7.8   Hemoglobin 13.0 - 17.0 g/dL 14.0  14.9  10.0   Hematocrit 39.0 - 52.0 % 41.7  44.2  28.8   Platelets 150 - 400 K/uL 89  140  203       Latest Ref Rng & Units 10/11/2022    6:23 PM 02/21/2021   10:14 AM 01/25/2013    4:08 PM  CMP  Glucose 70 - 99 mg/dL 88  103  78   BUN 8 - 23 mg/dL 26  20  19    Creatinine 0.61 - 1.24 mg/dL 1.21  1.03  1.08   Sodium 135 - 145 mmol/L 139  142  140   Potassium 3.5 - 5.1 mmol/L 4.4  4.6  4.3   Chloride 98 - 111 mmol/L 105  106  103   CO2 22 - 32 mmol/L 25  23  26    Calcium 8.9 - 10.3 mg/dL 8.9  9.5  9.8   Total Protein 6.5 - 8.1 g/dL 6.4     Total Bilirubin 0.3 - 1.2 mg/dL 0.9     Alkaline Phos 38 - 126 U/L 64     AST 15 - 41 U/L 27     ALT 0 - 44 U/L 24        Assessment and Plan: * Unstable angina (HCC) Concern for UA with minimally elevated trop. EKG without obvious changes. See cards note Chest pain obs pathway Heparin gtt ASA NTG GTT Tele monitor 2d echo Cards planning [facility] on Monday (NPO after MN Monday AM)  Thrombocytopenia ([place]) ? Chronic Looks like he has history of mild chronic thrombocytopenia in past, though last labs prior to  today were 10 years ago.  Mixed hyperlipidemia Cont statin  Essential hypertension Hold home ARB  On NTG GTT for the moment      Advance Care Planning:   Code Status: Full Code  Consults: Dr. Shellia Carwin  Family Communication: ***  Severity of Illness: The appropriate patient status for this patient is INPATIENT. Inpatient status is judged to be reasonable and necessary in order to provide the required intensity of service to ensure the patient's safety. The patient's presenting symptoms, physical exam findings, and initial radiographic and laboratory data in the context of their chronic comorbidities is felt to place them at high risk for further clinical deterioration. Furthermore, it is not anticipated that the patient will be medically stable for discharge from the hospital within 2 midnights of admission.   * I certify that at the point of admission it is my clinical judgment that the patient will require inpatient hospital care spanning beyond 2 midnights from the point of admission due to high intensity of service, high risk for further deterioration and high frequency of surveillance required.*  Author: Etta Quill., DO 10/11/2022 9:25 PM  For on call review [email or web address].

## 2022-10-11 NOTE — Assessment & Plan Note (Signed)
Concern for UA with minimally elevated trop. EKG without obvious changes. See cards note Chest pain obs pathway Heparin gtt ASA NTG GTT Tele monitor 2d echo Cards planning LHC on Monday (NPO after MN Monday AM)

## 2022-10-11 NOTE — Assessment & Plan Note (Signed)
Cont statin

## 2022-10-11 NOTE — Assessment & Plan Note (Addendum)
?   Chronic Looks like he has history of mild chronic thrombocytopenia in past, though last labs prior to today were 10 years ago.

## 2022-10-11 NOTE — Progress Notes (Signed)
ANTICOAGULATION CONSULT NOTE - Initial Consult  Pharmacy Consult for Heparin Indication: chest pain/ACS  No Known Allergies  Patient Measurements: Height: 5' 9.5" (176.5 cm) Weight: 93 kg (205 lb) IBW/kg (Calculated) : 71.85 Heparin Dosing Weight: 90.8 kg  Vital Signs: Temp: 98.7 F (37.1 C) (03/30 1805) Temp Source: Oral (03/30 1805) BP: 115/75 (03/30 1927) Pulse Rate: 83 (03/30 1928)  Labs: Recent Labs    10/11/22 1823  HGB 14.0  HCT 41.7  PLT 89*  CREATININE 1.21  TROPONINIHS 22*    Estimated Creatinine Clearance: 56.2 mL/min (by C-G formula based on SCr of 1.21 mg/dL).   Medical History: Past Medical History:  Diagnosis Date   Coronary artery disease    GERD (gastroesophageal reflux disease)    Gout    History of hiatal hernia    Hypertension    Pulmonary embolism ([place]) 2008 after knee surgey   Renal insufficiency    hx renal failure 2008 after blood clots   Sleep apnea     Medications:  (Not in a hospital admission)  Scheduled:   aspirin EC  81 mg Oral Daily   rosuvastatin  20 mg Oral QHS   Infusions:   nitroGLYCERIN     PRN:   Assessment: 68 yom with a history of HTN, HLD, mild non-obstructive CAD. Patient is presenting with chest pain and SOB. Heparin per pharmacy consult placed for chest pain/ACS.  Patient is not on anticoagulation prior to arrival.  Hgb 14; plt 89  Goal of Therapy:  Heparin level 0.3-0.7 units/ml Monitor platelets by anticoagulation protocol: Yes   Plan:  No initial heparin bolus Start heparin infusion at 1200 units/hr Check anti-Xa level in 8 hours and daily while on heparin Continue to monitor H&H and platelets  Lorelei Pont, PharmD, BCPS 10/11/2022 8:33 PM ED Clinical Pharmacist -  [phone]

## 2022-10-11 NOTE — ED Notes (Signed)
ED TO INPATIENT HANDOFF REPORT  ED Nurse Name and Phone #: Albina Billet [phone]  S Name/Age/Gender Ralph Blackwell 82 y.o. male Room/Bed: 008C/008C  Code Status   Code Status: Full Code  Home/SNF/Other Home Patient oriented to: self, place, time, and situation Is this baseline? Yes   Triage Complete: Triage complete  Chief Complaint Unstable angina [I20.0]  Triage Note Pt arrives via [facility] from home c/o CP and SOB. Pt reports CP initiated while walking at approximately 1630, pain radiating to bilateral shoulder blades. Pt took SL Nitro x1 at home, pt reports pain decreased from 8/10 to 2/10. 324mg  Asprin given by EMS.    Allergies No Known Allergies  Level of Care/Admitting Diagnosis ED Disposition   ED Disposition: Admit Condition: None Comment: Hospital Area: [place] [[id number]]  Level of Care: Progressive [102]  Admit to Progressive based on following criteria: CARDIOVASCULAR & THORACIC of moderate stability with acute coronary syndrome symptoms/low risk myocardial infarction/hypertensive urgency/arrhythmias/heart failure potentially compromising stability and stable post cardiovascular intervention patients.  May place patient in observation at [facility] or [facility] if equivalent level of care is available:: No  Covid Evaluation: Asymptomatic - no recent exposure (last 10 days) testing not required  Diagnosis: Unstable angina [id number]  Admitting Physician: Etta Quill [id number]  Attending Physician: Etta Quill [[id number]]      B Medical/Surgery History Past Medical History:  Diagnosis Date   Coronary artery disease    GERD (gastroesophageal reflux disease)    Gout    History of hiatal hernia    Hypertension    Pulmonary embolism ([place]) 2008 after knee surgey   Renal insufficiency    hx renal failure 2008 after blood clots   Sleep apnea    Past Surgical History:  Procedure Laterality Date   arthroscopic knee surgery  2004   BACK  SURGERY  nov 1979 and 1998   lower   COLONOSCOPY WITH PROPOFOL N/A 10/20/2016   Procedure: COLONOSCOPY WITH PROPOFOL;  Surgeon: Garlan Fair, MD;  Location: [facility];  Service: Endoscopy;  Laterality: N/A;   JOINT REPLACEMENT Left 2007   knee    LEFT HEART CATH AND CORONARY ANGIOGRAPHY N/A 06/30/2017   Procedure: LEFT HEART CATH AND CORONARY ANGIOGRAPHY;  Surgeon: Nigel Mormon, MD;  Location: [facility];  Service: Cardiovascular;  Laterality: N/A;   left wrist plating   2005   titanium plate   right total knee replacement Right 2009     A IV Location/Drains/Wounds Patient Lines/Drains/Airways Status     Active Line/Drains/Airways     Name Placement date Placement time Site Days   Peripheral IV 10/11/22 Anterior;Distal;Right;Upper Arm 10/11/22  1815  Arm  less than 1            Intake/Output Last 24 hours No intake or output data in the 24 hours ending 10/11/22 2209  Labs/Imaging Results for orders placed or performed during the hospital encounter of 10/11/22 (from the past 48 hour(s))  CBC     Status: Abnormal   Collection Time: 10/11/22  6:23 PM  Result Value Ref Range   WBC 7.4 4.0 - 10.5 K/uL   RBC 4.55 4.22 - 5.81 MIL/uL   Hemoglobin 14.0 13.0 - 17.0 g/dL   HCT 41.7 39.0 - 52.0 %   MCV 91.6 80.0 - 100.0 fL   MCH 30.8 26.0 - 34.0 pg   MCHC 33.6 30.0 - 36.0 g/dL   RDW 14.7 11.5 - 15.5 %  Platelets 89 (L) 150 - 400 K/uL    Comment: Immature Platelet Fraction may be clinically indicated, consider ordering this additional test [id number] REPEATED TO VERIFY PLATELET COUNT CONFIRMED BY SMEAR    nRBC 0.0 0.0 - 0.2 %    Comment: Performed at [facility], [place] [address] [place], [place] [address]  Troponin I (High Sensitivity)     Status: Abnormal   Collection Time: 10/11/22  6:23 PM  Result Value Ref Range   Troponin I (High Sensitivity) 22 (H) <18 ng/L    Comment: (NOTE) Elevated high sensitivity troponin I (hsTnI) values and  significant  changes across serial measurements may suggest ACS but many other  chronic and acute conditions are known to elevate hsTnI results.  Refer to the "Links" section for chest pain algorithms and additional  guidance. Performed at [facility], [place] [address] [place], [place] [address]   Comprehensive metabolic panel     Status: Abnormal   Collection Time: 10/11/22  6:23 PM  Result Value Ref Range   Sodium 139 135 - 145 mmol/L   Potassium 4.4 3.5 - 5.1 mmol/L   Chloride 105 98 - 111 mmol/L   CO2 25 22 - 32 mmol/L   Glucose, Bld 88 70 - 99 mg/dL    Comment: Glucose reference range applies only to samples taken after fasting for at least 8 hours.   BUN 26 (H) 8 - 23 mg/dL   Creatinine, Ser 1.21 0.61 - 1.24 mg/dL   Calcium 8.9 8.9 - 10.3 mg/dL   Total Protein 6.4 (L) 6.5 - 8.1 g/dL   Albumin 3.4 (L) 3.5 - 5.0 g/dL   AST 27 15 - 41 U/L   ALT 24 0 - 44 U/L   Alkaline Phosphatase 64 38 - 126 U/L   Total Bilirubin 0.9 0.3 - 1.2 mg/dL   GFR, Estimated >60 >60 mL/min    Comment: (NOTE) Calculated using the CKD-EPI Creatinine Equation (2021)    Anion gap 9 5 - 15    Comment: Performed at [facility], [place] [address] [place], [place] [address]   DG Chest 2 View  Result Date: 10/11/2022 CLINICAL DATA:  Chest EXAM: CHEST - 2 VIEW COMPARISON:  CXR 01/25/13 FINDINGS: No pleural effusion. No pneumothorax. No focal airspace opacity. Normal cardiac and mediastinal contours. No radiographically apparent displaced rib fractures. Visualized upper abdomen is unremarkable. Degenerative changes of the left AC joint and right glenohumeral joint. IMPRESSION: No focal airspace opacity. Electronically Signed   By: Marin Roberts M.D.   On: 10/11/2022 18:53    Pending Labs Unresulted Labs (From admission, onward)     Start     Ordered   10/13/22 0500  Heparin level (unfractionated)  Daily,   R      10/11/22 2036   10/12/22 0500  Heparin level (unfractionated)  Once-Timed,   URGENT         10/11/22 2036            Vitals/Pain Today's Vitals   10/11/22 2100 10/11/22 2130 10/11/22 2145 10/11/22 2200  BP: 116/64 114/61 113/78 102/73  Pulse: 86 81 83 81  Resp: (Abnormal) 22 (Abnormal) 25 14 17   Temp:      TempSrc:      SpO2: 94% 96% 93% 95%  Weight:      Height:      PainSc:        Isolation Precautions No active isolations  Medications Medications  nitroGLYCERIN 50  mg in dextrose 5 % 250 mL (0.2 mg/mL) infusion (5 mcg/min Intravenous New Bag/Given 10/11/22 2122)  rosuvastatin (CRESTOR) tablet 20 mg (20 mg Oral Given 10/11/22 2122)  aspirin EC tablet 81 mg (81 mg Oral Given 10/11/22 2122)  heparin ADULT infusion 100 units/mL (25000 units/251mL) (1,200 Units/hr Intravenous New Bag/Given 10/11/22 2126)  acetaminophen (TYLENOL) tablet 650 mg (has no administration in time range)  ondansetron (ZOFRAN) injection 4 mg (has no administration in time range)  nitroGLYCERIN (NITROGLYN) 2 % ointment 0.5 inch (0.5 inches Topical Given 10/11/22 1927)    Mobility walks     Focused Assessments Cardiac Assessment Handoff:  Cardiac Rhythm: Normal sinus rhythm No results found for: "CKTOTAL", "CKMB", "CKMBINDEX", "TROPONINI" Lab Results  Component Value Date   DDIMER 0.50 (H) 01/25/2013   Does the Patient currently have chest pain? No    R Recommendations: See Admitting Provider Note  Report given to:   Additional Notes:

## 2022-10-11 NOTE — ED Provider Notes (Addendum)
[facility] Provider Note   CSN: [id number] Arrival date & time: 10/11/22  1757     History  Chief Complaint  Patient presents with   Chest Pain    Ralph Blackwell is a 82 y.o. male.  HPI   82 year old male presents emergency department with worsening shortness of breath and an episode today of chest pain and diaphoresis.  Over the past couple weeks patient has been having exertional shortness of breath.  This is atypical.  Patient follows with [facility], no recent stress test.  Today after taking the dog into the backyard he had an episode of severe chest tightness and shortness of breath.  He had to sit down, took 1 sublingual nitroglycerin which improved but did not relieve the pain.  EMS was called.  On arrival patient's pain is down to a 2/10 but he continues to have midsternal chest heaviness.  No radiation to the back or abdomen.  No other recent illness, fever or acute change in his health.  Home Medications Prior to Admission medications   Medication Sig Start Date End Date Taking? Authorizing Provider  allopurinol (ZYLOPRIM) 300 MG tablet Take 150 mg by mouth daily.     [provider]  ascorbic acid (VITAMIN C) 500 MG tablet Take 500 mg by mouth daily.    [provider]  Cholecalciferol (VITAMIN D3) 5000 units CAPS Take 2,000 Units by mouth daily with supper.     [provider]  finasteride (PROSCAR) 5 MG tablet Take 5 mg by mouth at bedtime.     [provider]  fluticasone (FLONASE) 50 MCG/ACT nasal spray Place 1 spray into both nostrils every evening.    [provider]  magnesium gluconate (MAGONATE) 500 MG tablet Take 500 mg by mouth 2 (two) times daily.    [provider]  nitroGLYCERIN (NITROSTAT) 0.4 MG SL tablet Place 0.4 mg under the tongue every 5 (five) minutes as needed for chest pain.    [provider]  Omega-3 Fatty Acids (FISH OIL) 1000 MG  CAPS Take 1,000 mg by mouth daily.    [provider]  omeprazole (PRILOSEC) 40 MG capsule Take 1 capsule (40 mg total) by mouth daily. 04/21/22   Patwardhan, Reynold Bowen, MD  rosuvastatin (CRESTOR) 20 MG tablet Take 1 tablet (20 mg total) by mouth daily. 06/30/22 06/25/23  Patwardhan, Reynold Bowen, MD  telmisartan (MICARDIS) 80 MG tablet Take 40 mg by mouth daily.    [provider]  zinc gluconate 50 MG tablet Take 50 mg by mouth daily.    [provider]      Allergies    Patient has no known allergies.    Review of Systems   Review of Systems  Constitutional:  Positive for diaphoresis and fever.  Respiratory:  Positive for chest tightness and shortness of breath.   Cardiovascular:  Positive for chest pain. Negative for palpitations and leg swelling.  Gastrointestinal:  Negative for abdominal pain, diarrhea and vomiting.  Skin:  Negative for rash.  Neurological:  Negative for syncope and headaches.    Physical Exam Updated Vital Signs BP 115/75   Pulse 83   Temp 98.7 F (37.1 C) (Oral)   Resp 13   Ht 5' 9.5" (1.765 m)   Wt 93 kg   SpO2 96%   BMI 29.84 kg/m  Physical Exam Vitals and nursing note reviewed.  Constitutional:      General: He is not  in acute distress.    Appearance: Normal appearance.  HENT:     Head: Normocephalic.     Mouth/Throat:     Mouth: Mucous membranes are moist.  Cardiovascular:     Rate and Rhythm: Normal rate.  Pulmonary:     Effort: Pulmonary effort is normal. No respiratory distress.  Abdominal:     Palpations: Abdomen is soft.     Tenderness: There is no abdominal tenderness.  Musculoskeletal:     Right lower leg: No edema.     Left lower leg: No edema.  Skin:    General: Skin is warm.  Neurological:     Mental Status: He is alert and oriented to person, place, and time. Mental status is at baseline.  Psychiatric:        Mood and Affect: Mood normal.     ED Results / Procedures / Treatments   Labs (all labs  ordered are listed, but only abnormal results are displayed) Labs Reviewed  CBC - Abnormal; Notable for the following components:      Result Value   Platelets 89 (*)    All other components within normal limits  COMPREHENSIVE METABOLIC PANEL - Abnormal; Notable for the following components:   BUN 26 (*)    Total Protein 6.4 (*)    Albumin 3.4 (*)    All other components within normal limits  TROPONIN I (HIGH SENSITIVITY) - Abnormal; Notable for the following components:   Troponin I (High Sensitivity) 22 (*)    All other components within normal limits  HEPARIN LEVEL (UNFRACTIONATED)  TROPONIN I (HIGH SENSITIVITY)    EKG EKG Interpretation  Date/Time:  Saturday October 11 2022 18:14:38 EDT Ventricular Rate:  87 PR Interval:  205 QRS Duration: 91 QT Interval:  340 QTC Calculation: 409 R Axis:   -42 Text Interpretation: Sinus rhythm Atrial premature complex Left anterior fascicular block Low voltage, precordial leads Abnormal R-wave progression, late transition Similar to previous Confirmed by Lavenia Atlas [id number]) on 10/11/2022 6:56:54 PM  Radiology DG Chest 2 View  Result Date: 10/11/2022 CLINICAL DATA:  Chest EXAM: CHEST - 2 VIEW COMPARISON:  CXR 01/25/13 FINDINGS: No pleural effusion. No pneumothorax. No focal airspace opacity. Normal cardiac and mediastinal contours. No radiographically apparent displaced rib fractures. Visualized upper abdomen is unremarkable. Degenerative changes of the left AC joint and right glenohumeral joint. IMPRESSION: No focal airspace opacity. Electronically Signed   By: Marin Roberts M.D.   On: 10/11/2022 18:53    Procedures .Critical Care  Performed by: Lorelle Gibbs, DO Authorized by: Lorelle Gibbs, DO   Critical care provider statement:    Critical care time (minutes):  30   Critical care time was exclusive of:  Separately billable procedures and treating other patients   Critical care was necessary to treat or prevent imminent or  life-threatening deterioration of the following conditions:  Cardiac failure   Critical care was time spent personally by me on the following activities:  Development of treatment plan with patient or surrogate, discussions with consultants, evaluation of patient's response to treatment, examination of patient, ordering and review of laboratory studies, ordering and review of radiographic studies, ordering and performing treatments and interventions, pulse oximetry, re-evaluation of patient's condition and review of old charts   I assumed direction of critical care for this patient from another provider in my specialty: no     Care discussed with: admitting provider       Medications Ordered in ED Medications  nitroGLYCERIN  50 mg in dextrose 5 % 250 mL (0.2 mg/mL) infusion (has no administration in time range)  rosuvastatin (CRESTOR) tablet 20 mg (has no administration in time range)  aspirin EC tablet 81 mg (has no administration in time range)  heparin ADULT infusion 100 units/mL (25000 units/28mL) (has no administration in time range)  nitroGLYCERIN (NITROGLYN) 2 % ointment 0.5 inch (0.5 inches Topical Given 10/11/22 1927)    ED Course/ Medical Decision Making/ A&P                             Medical Decision Making Amount and/or Complexity of Data Reviewed Labs: ordered. Radiology: ordered.  Risk Prescription drug management. Decision regarding hospitalization.   82 year old male presents to the emergency department with concern for chest tightness, shortness of breath.  Also has been having exertional shortness of breath for the past couple weeks.  Vitals are stable on arrival, active chest tightness at 2/10.  EKG shows no acute ischemic changes for the patient.  Chest x-ray is unremarkable.  Blood work shows an initial troponin of 22.  After administration of Nitropaste the chest pain is completely resolved.  On-call cardiologist for [facility] cardiology, Dr. Shellia Carwin aware of the  patient's current status.  Will treat him for NSTEMI with heparin and plan for Cath Lab on Monday unless there is any acute change.  Delta trop is pending. If no significant change and stable, will admit to hospitalist.  Patients evaluation and results requires admission for further treatment and care.  Spoke with hospitalist, reviewed patient's ED course and they accept admission.  Patient agrees with admission plan, offers no new complaints and is stable/unchanged at time of admit.     Final Clinical Impression(s) / ED Diagnoses Final diagnoses:  NSTEMI (non-ST elevated myocardial infarction) [facility])    Rx / DC Orders ED Discharge Orders     None         Lorelle Gibbs, DO 10/11/22 2102    Lorelle Gibbs, DO 10/11/22 2138

## 2022-10-11 NOTE — ED Triage Notes (Signed)
Pt arrives via [facility] from home c/o CP and SOB. Pt reports CP initiated while walking at approximately 1630, pain radiating to bilateral shoulder blades. Pt took SL Nitro x1 at home, pt reports pain decreased from 8/10 to 2/10. 324mg  Asprin given by EMS.

## 2022-10-12 ENCOUNTER — Observation Stay (HOSPITAL_COMMUNITY): Payer: PPO

## 2022-10-12 ENCOUNTER — Other Ambulatory Visit (HOSPITAL_COMMUNITY): Payer: PPO

## 2022-10-12 DIAGNOSIS — I25110 Atherosclerotic heart disease of native coronary artery with unstable angina pectoris: Secondary | ICD-10-CM | POA: Diagnosis present

## 2022-10-12 DIAGNOSIS — I480 Paroxysmal atrial fibrillation: Secondary | ICD-10-CM | POA: Diagnosis present

## 2022-10-12 DIAGNOSIS — R079 Chest pain, unspecified: Secondary | ICD-10-CM | POA: Diagnosis not present

## 2022-10-12 DIAGNOSIS — I82431 Acute embolism and thrombosis of right popliteal vein: Secondary | ICD-10-CM | POA: Diagnosis present

## 2022-10-12 DIAGNOSIS — M79A11 Nontraumatic compartment syndrome of right upper extremity: Secondary | ICD-10-CM | POA: Diagnosis not present

## 2022-10-12 DIAGNOSIS — Y840 Cardiac catheterization as the cause of abnormal reaction of the patient, or of later complication, without mention of misadventure at the time of the procedure: Secondary | ICD-10-CM | POA: Diagnosis not present

## 2022-10-12 DIAGNOSIS — G4730 Sleep apnea, unspecified: Secondary | ICD-10-CM | POA: Diagnosis not present

## 2022-10-12 DIAGNOSIS — R338 Other retention of urine: Secondary | ICD-10-CM | POA: Diagnosis present

## 2022-10-12 DIAGNOSIS — E875 Hyperkalemia: Secondary | ICD-10-CM | POA: Diagnosis not present

## 2022-10-12 DIAGNOSIS — J9601 Acute respiratory failure with hypoxia: Secondary | ICD-10-CM | POA: Diagnosis present

## 2022-10-12 DIAGNOSIS — I97630 Postprocedural hematoma of a circulatory system organ or structure following a cardiac catheterization: Secondary | ICD-10-CM | POA: Diagnosis not present

## 2022-10-12 DIAGNOSIS — D62 Acute posthemorrhagic anemia: Secondary | ICD-10-CM | POA: Diagnosis not present

## 2022-10-12 DIAGNOSIS — I2699 Other pulmonary embolism without acute cor pulmonale: Secondary | ICD-10-CM | POA: Diagnosis not present

## 2022-10-12 DIAGNOSIS — S5010XA Contusion of unspecified forearm, initial encounter: Secondary | ICD-10-CM | POA: Diagnosis not present

## 2022-10-12 DIAGNOSIS — S51801A Unspecified open wound of right forearm, initial encounter: Secondary | ICD-10-CM | POA: Diagnosis not present

## 2022-10-12 DIAGNOSIS — T81718A Complication of other artery following a procedure, not elsewhere classified, initial encounter: Secondary | ICD-10-CM | POA: Diagnosis not present

## 2022-10-12 DIAGNOSIS — D696 Thrombocytopenia, unspecified: Secondary | ICD-10-CM | POA: Diagnosis not present

## 2022-10-12 DIAGNOSIS — D649 Anemia, unspecified: Secondary | ICD-10-CM | POA: Diagnosis not present

## 2022-10-12 DIAGNOSIS — I10 Essential (primary) hypertension: Secondary | ICD-10-CM | POA: Diagnosis present

## 2022-10-12 DIAGNOSIS — I2694 Multiple subsegmental pulmonary emboli without acute cor pulmonale: Secondary | ICD-10-CM | POA: Diagnosis not present

## 2022-10-12 DIAGNOSIS — M109 Gout, unspecified: Secondary | ICD-10-CM | POA: Diagnosis present

## 2022-10-12 DIAGNOSIS — I2602 Saddle embolus of pulmonary artery with acute cor pulmonale: Secondary | ICD-10-CM | POA: Diagnosis present

## 2022-10-12 DIAGNOSIS — I272 Pulmonary hypertension, unspecified: Secondary | ICD-10-CM | POA: Diagnosis not present

## 2022-10-12 DIAGNOSIS — K219 Gastro-esophageal reflux disease without esophagitis: Secondary | ICD-10-CM | POA: Diagnosis present

## 2022-10-12 DIAGNOSIS — E782 Mixed hyperlipidemia: Secondary | ICD-10-CM | POA: Diagnosis present

## 2022-10-12 DIAGNOSIS — N401 Enlarged prostate with lower urinary tract symptoms: Secondary | ICD-10-CM | POA: Diagnosis present

## 2022-10-12 DIAGNOSIS — N289 Disorder of kidney and ureter, unspecified: Secondary | ICD-10-CM | POA: Diagnosis not present

## 2022-10-12 DIAGNOSIS — Z96651 Presence of right artificial knee joint: Secondary | ICD-10-CM | POA: Diagnosis present

## 2022-10-12 DIAGNOSIS — E871 Hypo-osmolality and hyponatremia: Secondary | ICD-10-CM | POA: Diagnosis present

## 2022-10-12 DIAGNOSIS — E876 Hypokalemia: Secondary | ICD-10-CM | POA: Diagnosis not present

## 2022-10-12 DIAGNOSIS — T79A11A Traumatic compartment syndrome of right upper extremity, initial encounter: Secondary | ICD-10-CM | POA: Diagnosis not present

## 2022-10-12 DIAGNOSIS — I2089 Other forms of angina pectoris: Secondary | ICD-10-CM | POA: Diagnosis present

## 2022-10-12 DIAGNOSIS — D6959 Other secondary thrombocytopenia: Secondary | ICD-10-CM | POA: Diagnosis present

## 2022-10-12 DIAGNOSIS — I200 Unstable angina: Secondary | ICD-10-CM | POA: Diagnosis present

## 2022-10-12 DIAGNOSIS — I440 Atrioventricular block, first degree: Secondary | ICD-10-CM | POA: Diagnosis present

## 2022-10-12 DIAGNOSIS — I82461 Acute embolism and thrombosis of right calf muscular vein: Secondary | ICD-10-CM | POA: Diagnosis present

## 2022-10-12 DIAGNOSIS — I5032 Chronic diastolic (congestive) heart failure: Secondary | ICD-10-CM | POA: Diagnosis not present

## 2022-10-12 DIAGNOSIS — Z79899 Other long term (current) drug therapy: Secondary | ICD-10-CM | POA: Diagnosis not present

## 2022-10-12 DIAGNOSIS — G5601 Carpal tunnel syndrome, right upper limb: Secondary | ICD-10-CM | POA: Diagnosis present

## 2022-10-12 DIAGNOSIS — L7682 Other postprocedural complications of skin and subcutaneous tissue: Secondary | ICD-10-CM | POA: Diagnosis not present

## 2022-10-12 DIAGNOSIS — I721 Aneurysm of artery of upper extremity: Secondary | ICD-10-CM | POA: Diagnosis not present

## 2022-10-12 LAB — ECHOCARDIOGRAM COMPLETE
AR max vel: 2.61 cm2
AV Area VTI: 2.7 cm2
AV Area mean vel: 2.49 cm2
AV Mean grad: 3 mmHg
AV Peak grad: 5.9 mmHg
AV Vena cont: 0.4 cm
Ao pk vel: 1.22 m/s
Area-P 1/2: 2.77 cm2
Height: 69.5 in
S' Lateral: 2.6 cm
Weight: 3280 oz

## 2022-10-12 LAB — HEPARIN LEVEL (UNFRACTIONATED)
Heparin Unfractionated: 0.46 IU/mL (ref 0.30–0.70)
Heparin Unfractionated: 0.49 IU/mL (ref 0.30–0.70)

## 2022-10-12 MED ORDER — IRBESARTAN 150 MG PO TABS
150.0000 mg | ORAL_TABLET | Freq: Every day | ORAL | Status: DC
  Administered 2022-10-15: 150 mg via ORAL
  Filled 2022-10-12 (×3): qty 1

## 2022-10-12 MED ORDER — METOPROLOL TARTRATE 25 MG PO TABS: 25.0000 mg | ORAL_TABLET | Freq: Two times a day (BID) | ORAL | Status: DC

## 2022-10-12 MED ORDER — ROSUVASTATIN CALCIUM 20 MG PO TABS
40.0000 mg | ORAL_TABLET | Freq: Every day | ORAL | Status: DC
  Administered 2022-10-12 – 2022-10-26 (×15): 40 mg via ORAL
  Filled 2022-10-12 (×15): qty 2

## 2022-10-12 MED ORDER — PANTOPRAZOLE SODIUM 40 MG PO TBEC
80.0000 mg | DELAYED_RELEASE_TABLET | Freq: Every day | ORAL | Status: DC
  Administered 2022-10-12 – 2022-10-27 (×13): 80 mg via ORAL
  Filled 2022-10-12 (×14): qty 2

## 2022-10-12 MED ORDER — METOPROLOL SUCCINATE ER 25 MG PO TB24
25.0000 mg | ORAL_TABLET | Freq: Every day | ORAL | Status: DC
  Administered 2022-10-12 – 2022-10-15 (×2): 25 mg via ORAL
  Filled 2022-10-12 (×3): qty 1

## 2022-10-12 MED ORDER — IRBESARTAN 150 MG PO TABS: 150.0000 mg | ORAL_TABLET | Freq: Every day | ORAL | Status: DC

## 2022-10-12 MED ORDER — ALLOPURINOL 300 MG PO TABS
150.0000 mg | ORAL_TABLET | Freq: Every day | ORAL | Status: DC
  Administered 2022-10-12 – 2022-10-27 (×11): 150 mg via ORAL
  Filled 2022-10-12 (×12): qty 1

## 2022-10-12 MED ORDER — ORAL CARE MOUTH RINSE: 15.0000 mL | OROMUCOSAL | Status: DC | PRN

## 2022-10-12 MED ORDER — FINASTERIDE 5 MG PO TABS
5.0000 mg | ORAL_TABLET | Freq: Every day | ORAL | Status: DC
  Administered 2022-10-12 – 2022-10-26 (×15): 5 mg via ORAL
  Filled 2022-10-12 (×16): qty 1

## 2022-10-12 NOTE — Progress Notes (Signed)
ANTICOAGULATION CONSULT NOTE - Follow Up Consult  Pharmacy Consult for heparin Indication: chest pain/ACS  Labs: Recent Labs    10/11/22 1823 10/11/22 2119 10/12/22 0601  HGB 14.0  --   --   HCT 41.7  --   --   PLT 89*  --   --   HEPARINUNFRC  --   --  0.46  CREATININE 1.21  --   --   TROPONINIHS 22* 24*  --     Assessment/Plan:  82yo male therapeutic on heparin with initial dosing for CP. Will continue infusion at current rate of 1200 units/hr and confirm stable with additional level.   Wynona Neat, PharmD, BCPS  10/12/2022,6:38 AM

## 2022-10-12 NOTE — Progress Notes (Signed)
  Progress Note   Patient: Ralph Blackwell [id number] DOB: March 04, 1941 DOA: 10/11/2022     0 DOS: the patient was seen and examined on 10/12/2022   Brief hospital course: Mr. Woulfe is a 82 year old gentleman who presented emergency department with chest pain, shortness of breath, diuresis.  He was given nitroglycerin with assistance in his symptoms.  He was also eventually placed on Nitropaste.  He presented to the ER due to concern for unstable angina was started on heparin drip.  Echocardiogram was ordered and patient will be taken to the Cath Lab on Monday.  Assessment and Plan: * Unstable angina ([employer]) Concern for UA with minimally elevated trop. EKG without obvious changes. See cards note Chest pain obs pathway Heparin gtt ASA NTG GTT Tele monitor 2d echo Cards planning LHC on Monday (NPO after MN Monday AM) Start beta-blocker  Thrombocytopenia ([place]) ? Chronic Looks like he has history of mild chronic thrombocytopenia in past, though last labs prior to today were 10 years ago.  Mixed hyperlipidemia Cont statin  Essential hypertension Resume ARB        Subjective: Gentleman presented with unstable angina yesterday.  Currently chest pain-free on nitro drip at 2 mcg/min.  Plan to take for heart cath tomorrow.  Physical Exam: Vitals:   10/12/22 0737 10/12/22 0800 10/12/22 0900 10/12/22 1000  BP:  (!) 106/59 115/66 122/62  Pulse: 81 75 89 82  Resp: 20 16 19 18   Temp:      TempSrc:      SpO2: 96% 93% 93% 94%  Weight:      Height:       Physical Exam Vitals reviewed.  Constitutional:      Appearance: He is normal weight.  Eyes:     Pupils: Pupils are equal, round, and reactive to light.  Cardiovascular:     Rate and Rhythm: Normal rate.     Heart sounds: Normal heart sounds.  Pulmonary:     Effort: Pulmonary effort is normal.     Breath sounds: Normal breath sounds.  Abdominal:     General: Bowel sounds are normal.     Palpations: Abdomen is soft.   Musculoskeletal:        General: Normal range of motion.     Cervical back: Normal range of motion.  Skin:    General: Skin is warm.     Capillary Refill: Capillary refill takes less than 2 seconds.  Neurological:     Mental Status: He is alert.      Disposition: Status is: Observation The patient remains OBS appropriate and will d/c before 2 midnights.  Planned Discharge Destination: Home    Time spent: 45 minutes  Author: Emilee Hero, MD 10/12/2022 11:32 AM  For on call review [email or web address].

## 2022-10-12 NOTE — H&P (View-Only) (Signed)
CARDIOLOGY CONSULT NOTE  Patient ID: Ralph Blackwell MRN: [id number] DOB/AGE: 1941-03-17 82 y.o.  Admit date: 10/11/2022 Referring Physician  Dr Annie Paras Primary Physician:  Corliss Blacker, MD Reason for Consultation  unstable angina  Patient ID: Ralph Blackwell, male    DOB: 1941-02-12, 82 y.o.   MRN: [id number]  Chief Complaint  Patient presents with   Chest Pain   HPI:    Ralph GRIESEL  is a 82 y.o. male with past medical history significant for hypertension, hyperlipidemia, and CAD without prior intervention who presented to the hospital with chest pain and shortness of breath while walking his dog.   Patient's wife called [facility] paging service to let us know that she was concerned her husband was having a heart attack. He was very diaphoretic with chest pain and shortness of breath so wife called EMS. She said she "thought she was going to lose him before they got to the hospital" given the severity of his symptoms. Chest pain began when walking and radiated to both shoulders and arms. Wife gave him SL nitro before EMS arrived and that helped a little. He received full dose ASA and patient was stable while en route to hospital.    Nitro paste in ED has helped his chest pain even more. He remains on nitro and heparin gtt this morning. Patient does develop chest pain and shortness of breath with minimal activity like this morning while he was brushing his teeth. Nitro gtt is helping with chest pain at rest. He denies diaphoresis, syncope, edema, orthopnea, PND, claudication.      Past Medical History:  Diagnosis Date   Coronary artery disease    GERD (gastroesophageal reflux disease)    Gout    History of hiatal hernia    Hypertension    Pulmonary embolism ([place]) 2008 after knee surgey   Renal insufficiency    hx renal failure 2008 after blood clots   Sleep apnea    Past Surgical History:  Procedure Laterality Date   arthroscopic knee surgery  2004   BACK  SURGERY  nov 1979 and 1998   lower   COLONOSCOPY WITH PROPOFOL N/A 10/20/2016   Procedure: COLONOSCOPY WITH PROPOFOL;  Surgeon: Garlan Fair, MD;  Location: [facility];  Service: Endoscopy;  Laterality: N/A;   JOINT REPLACEMENT Left 2007   knee    LEFT HEART CATH AND CORONARY ANGIOGRAPHY N/A 06/30/2017   Procedure: LEFT HEART CATH AND CORONARY ANGIOGRAPHY;  Surgeon: Nigel Mormon, MD;  Location: [facility];  Service: Cardiovascular;  Laterality: N/A;   left wrist plating   2005   titanium plate   right total knee replacement Right 2009   Social History   Tobacco Use   Smoking status: Never   Smokeless tobacco: Never  Substance Use Topics   Alcohol use: No    Family History  Problem Relation Age of Onset   Heart disease Mother    Hypertension Mother    Heart disease Father    Heart attack Father    Hypertension Father     Marital Status: Married  ROS  Review of Systems  Cardiovascular:  Positive for chest pain and dyspnea on exertion.   Objective      10/12/2022   10:00 AM 10/12/2022    9:00 AM 10/12/2022    8:00 AM  Vitals with BMI  Systolic [id number]  Diastolic 62 66 59  Pulse 82 89 75    Blood  pressure 122/62, pulse 82, temperature 98.6 F (37 C), temperature source Oral, resp. rate 18, height 5' 9.5" (1.765 m), weight 93 kg, SpO2 94 %.    Physical Exam Vitals reviewed.  HENT:     Head: Normocephalic and atraumatic.  Cardiovascular:     Rate and Rhythm: Normal rate and regular rhythm.     Heart sounds: Murmur heard.  Pulmonary:     Effort: Pulmonary effort is normal.     Breath sounds: Normal breath sounds.  Abdominal:     General: Bowel sounds are normal.  Musculoskeletal:     Right lower leg: No edema.     Left lower leg: No edema.  Skin:    General: Skin is warm and dry.  Neurological:     Mental Status: He is alert.    Laboratory examination:   Recent Labs    10/11/22 1823  NA 139  K 4.4  CL 105  CO2 25  GLUCOSE 88   BUN 26*  CREATININE 1.21  CALCIUM 8.9  GFRNONAA >60   estimated creatinine clearance is 56.2 mL/min (by C-G formula based on SCr of 1.21 mg/dL).     Latest Ref Rng & Units 10/11/2022    6:23 PM 02/21/2021   10:14 AM 01/25/2013    4:08 PM  CMP  Glucose 70 - 99 mg/dL 88  103  78   BUN 8 - 23 mg/dL 26  20  19    Creatinine 0.61 - 1.24 mg/dL 1.21  1.03  1.08   Sodium 135 - 145 mmol/L 139  142  140   Potassium 3.5 - 5.1 mmol/L 4.4  4.6  4.3   Chloride 98 - 111 mmol/L 105  106  103   CO2 22 - 32 mmol/L 25  23  26    Calcium 8.9 - 10.3 mg/dL 8.9  9.5  9.8   Total Protein 6.5 - 8.1 g/dL 6.4     Total Bilirubin 0.3 - 1.2 mg/dL 0.9     Alkaline Phos 38 - 126 U/L 64     AST 15 - 41 U/L 27     ALT 0 - 44 U/L 24         Latest Ref Rng & Units 10/11/2022    6:23 PM 01/25/2013    4:08 PM 01/07/2008    4:55 AM  CBC  WBC 4.0 - 10.5 K/uL 7.4  6.4  7.8   Hemoglobin 13.0 - 17.0 g/dL 14.0  14.9  10.0   Hematocrit 39.0 - 52.0 % 41.7  44.2  28.8   Platelets 150 - 400 K/uL 89  140  203    Lipid Panel No results for input(s): "CHOL", "TRIG", "LDLCALC", "VLDL", "HDL", "CHOLHDL", "LDLDIRECT" in the last 8760 hours.  HEMOGLOBIN A1C No results found for: "HGBA1C", "MPG" TSH No results for input(s): "TSH" in the last 8760 hours. BNP (last 3 results) No results for input(s): "BNP" in the last 8760 hours. Cardiac Panel (last 3 results) Recent Labs    10/11/22 1823 10/11/22 2119  TROPONINIHS 22* 24*     Medications and allergies  No Known Allergies   No outpatient medications have been marked as taking for the 10/11/22 encounter [facility] Encounter).    Scheduled Meds:  allopurinol  150 mg Oral Daily   aspirin EC  81 mg Oral Daily   finasteride  5 mg Oral QHS   [START ON 10/14/2022] irbesartan  150 mg Oral Daily   metoprolol succinate  25 mg Oral Daily  pantoprazole  80 mg Oral Daily   rosuvastatin  40 mg Oral Daily   Continuous Infusions:  heparin 1,200 Units/hr (10/12/22 0600)    nitroGLYCERIN 5 mcg/min (10/12/22 0600)   PRN Meds:.acetaminophen, ondansetron (ZOFRAN) IV, mouth rinse   I/O last 3 completed shifts: In: 115.5 [I.V.:115.5] Out: 1050 [Urine:1050] Total I/O In: 240 [P.O.:240] Out: 250 [Urine:250]  Net IO Since Admission: -944.54 mL [10/12/22 1319]   Radiology:   Imaging results have been reviewed and DG Chest 2 View  Result Date: 10/11/2022 CLINICAL DATA:  Chest EXAM: CHEST - 2 VIEW COMPARISON:  CXR 01/25/13 FINDINGS: No pleural effusion. No pneumothorax. No focal airspace opacity. Normal cardiac and mediastinal contours. No radiographically apparent displaced rib fractures. Visualized upper abdomen is unremarkable. Degenerative changes of the left AC joint and right glenohumeral joint. IMPRESSION: No focal airspace opacity. Electronically Signed   By: Marin Roberts M.D.   On: 10/11/2022 18:53    Cardiac Studies:   Echocardiogram 10/08/2022:  1. Normal LV systolic function with visual EF 60-65%. Left ventricle cavity is normal in size. Normal left ventricular wall thickness. Normal global wall motion. Normal diastolic filling pattern, normal LAP. Calculated EF 70%. 2. Left atrial cavity is slightly dilated. 3. Trileaflet aortic valve. Moderate (Grade III) aortic regurgitation. 4. Structurally normal mitral valve. Mild (Grade I) mitral regurgitation. 5. Structurally normal tricuspid valve. Mild tricuspid regurgitation. 6. Structurally normal pulmonic valve. Mild pulmonic regurgitation. 7. No significant change compared to 03/2022.   EKG 02/20/2022: Sinus rhythm 64 bpm Frequent PACs Low voltage in precordial leads   Abdominal Aortic Duplex  03/22/2020:  Diffuse plaque noted in the proximal, mid and distal aorta.  A distal abdominal aortic aneurysm measuring 2.61 x 2.6 x 2.67 cm is seen.  Normal bilateral internal iliac artery size and velocity. Recheck in 5  years for stability.    Coronary angiography 06/2017: Mild nonobstructive coronary artery  disease (Prox LAD 30%) with sluggish flow in all vessels. Normal filling pressures. LVEF 55-65%    CT abdomen 2017: 1. Continued diverticulitis in the descending colon. A collection of fluid and gas along the medial aspect of the descending colon, at the site of diverticulitis, is consistent with an intramural abscess or contained perforation. This may explain the patient's resistance to treatment. 2. 10 mm lingular nodule. This appears to be associated with adjacent scarring. Recommend a three to six-month follow-up CT of the chest to ensure stability or resolution. 3. 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 The left common iliac artery is also prominent measuring 1.5 cm. These results will be called to the ordering clinician or representative by the Radiologist Assistant, and communication documented in the PACS or zVision Dashboard.  EKG:  10/11/2022: Sinus rhythm with PACs, rate 87 bpm. Low voltage in precordial leads. Poor R-wave progression. No evidence of active ischemia.  Tele 3/30-3/31/2024: normal sinus rhythm with PACs. No dynamic EKG changes noted, no active ischemia.  Assessment & Recommendations:   Unstable angina No ST elevations or new depressions on EKG or tele. There have been no dynamic EKG changes since arrival.  Troponin elevated @ 22 --> 24 Continue on heparin and nitro gtt. Toprol XL 25 mg qDay started. Patient has chest pain with minimal acitivty. NPO after midnight, plan for LHC with possible intervention with Dr Virgina Jock tomorrow morning.   Hypertension BP has been soft since admission. I will hold his ARB until 4/2 since he will be going for [facility] with possible intervention tomorrow  morning with Dr Virgina Jock. Baseline Cr ~1.0, currently Cr is 1.21   Hyperlipidemia  Continue Crestor 40 mg      Floydene Flock, DO, [facility] 10/12/2022, 1:19 PM Office: [phone]

## 2022-10-12 NOTE — Progress Notes (Signed)
ANTICOAGULATION CONSULT NOTE - Initial Consult  Pharmacy Consult for Heparin Indication: chest pain/ACS  No Known Allergies  Patient Measurements: Height: 5' 9.5" (176.5 cm) Weight: 93 kg (205 lb) IBW/kg (Calculated) : 71.85 Heparin Dosing Weight: 90.8 kg  Vital Signs: Temp: 98.6 F (37 C) (03/31 1200) Temp Source: Oral (03/31 1200) BP: 135/96 (03/31 1300) Pulse Rate: 83 (03/31 1530)  Labs: Recent Labs    10/11/22 1823 10/11/22 2119 10/12/22 0601 10/12/22 1522  HGB 14.0  --   --   --   HCT 41.7  --   --   --   PLT 89*  --   --   --   HEPARINUNFRC  --   --  0.46 0.49  CREATININE 1.21  --   --   --   TROPONINIHS 22* 24*  --   --      Estimated Creatinine Clearance: 56.2 mL/min (by C-G formula based on SCr of 1.21 mg/dL).   Medical History: Past Medical History:  Diagnosis Date   Coronary artery disease    GERD (gastroesophageal reflux disease)    Gout    History of hiatal hernia    Hypertension    Pulmonary embolism ([place]) 2008 after knee surgey   Renal insufficiency    hx renal failure 2008 after blood clots   Sleep apnea     Medications:  Medications Prior to Admission  Medication Sig Dispense Refill Last Dose   allopurinol (ZYLOPRIM) 300 MG tablet Take 150 mg by mouth daily.    10/11/2022   ascorbic acid (VITAMIN C) 500 MG tablet Take 500 mg by mouth daily.   10/11/2022   Cholecalciferol (VITAMIN D3) 5000 units CAPS Take 2,000 Units by mouth daily with supper.    10/11/2022   finasteride (PROSCAR) 5 MG tablet Take 5 mg by mouth at bedtime.    10/11/2022   fluticasone (FLONASE) 50 MCG/ACT nasal spray Place 1 spray into both nostrils every evening.   unk   magnesium gluconate (MAGONATE) 500 MG tablet Take 500 mg by mouth daily.   10/11/2022   nitroGLYCERIN (NITROSTAT) 0.4 MG SL tablet Place 0.4 mg under the tongue every 5 (five) minutes as needed for chest pain.   unk   Omega-3 Fatty Acids (FISH OIL) 1000 MG CAPS Take 1,000 mg by mouth daily.   10/11/2022    omeprazole (PRILOSEC) 40 MG capsule Take 1 capsule (40 mg total) by mouth daily. 90 capsule 3 10/11/2022   rosuvastatin (CRESTOR) 20 MG tablet Take 1 tablet (20 mg total) by mouth daily. 90 tablet 3 10/11/2022   telmisartan (MICARDIS) 80 MG tablet Take 40 mg by mouth daily.   10/11/2022   zinc gluconate 50 MG tablet Take 50 mg by mouth daily.   10/11/2022    Scheduled:   allopurinol  150 mg Oral Daily   aspirin EC  81 mg Oral Daily   finasteride  5 mg Oral QHS   [START ON 10/14/2022] irbesartan  150 mg Oral Daily   metoprolol succinate  25 mg Oral Daily   pantoprazole  80 mg Oral Daily   rosuvastatin  40 mg Oral Daily   Infusions:   heparin 1,200 Units/hr (10/12/22 0600)   nitroGLYCERIN 5 mcg/min (10/12/22 0600)   PRN:   Assessment: 47 yom with a history of HTN, HLD, mild non-obstructive CAD. Patient is presenting with chest pain and SOB. Heparin per pharmacy consult placed for chest pain/ACS.  Patient is not on anticoagulation prior to arrival.  Heparin  level came back therapeutic x2. Cont current rate.   Goal of Therapy:  Heparin level 0.3-0.7 units/ml Monitor platelets by anticoagulation protocol: Yes   Plan:   Cont heparin infusion 1200 units/hr Check anti-Xa level daily Continue to monitor H&H and platelets  Onnie Boer, PharmD, BCIDP, AAHIVP, CPP Infectious Disease Pharmacist 10/12/2022 4:13 PM

## 2022-10-12 NOTE — Consult Note (Signed)
CARDIOLOGY CONSULT NOTE  Patient ID: Ralph Blackwell MRN: [id number] DOB/AGE: Sep 25, 1940 82 y.o.  Admit date: 10/11/2022 Referring Physician  Dr Annie Paras Primary Physician:  Corliss Blacker, MD Reason for Consultation  unstable angina  Patient ID: Ralph Blackwell, male    DOB: January 12, 1941, 82 y.o.   MRN: [id number]  Chief Complaint  Patient presents with   Chest Pain   HPI:    Ralph Blackwell  is a 82 y.o. male with past medical history significant for hypertension, hyperlipidemia, and CAD without prior intervention who presented to the hospital with chest pain and shortness of breath while walking his dog.   Patient's wife called [facility] paging service to let us know that she was concerned her husband was having a heart attack. He was very diaphoretic with chest pain and shortness of breath so wife called EMS. She said she "thought she was going to lose him before they got to the hospital" given the severity of his symptoms. Chest pain began when walking and radiated to both shoulders and arms. Wife gave him SL nitro before EMS arrived and that helped a little. He received full dose ASA and patient was stable while en route to hospital.    Nitro paste in ED has helped his chest pain even more. He remains on nitro and heparin gtt this morning. Patient does develop chest pain and shortness of breath with minimal activity like this morning while he was brushing his teeth. Nitro gtt is helping with chest pain at rest. He denies diaphoresis, syncope, edema, orthopnea, PND, claudication.      Past Medical History:  Diagnosis Date   Coronary artery disease    GERD (gastroesophageal reflux disease)    Gout    History of hiatal hernia    Hypertension    Pulmonary embolism ([place]) 2008 after knee surgey   Renal insufficiency    hx renal failure 2008 after blood clots   Sleep apnea    Past Surgical History:  Procedure Laterality Date   arthroscopic knee surgery  2004   BACK  SURGERY  nov 1979 and 1998   lower   COLONOSCOPY WITH PROPOFOL N/A 10/20/2016   Procedure: COLONOSCOPY WITH PROPOFOL;  Surgeon: Garlan Fair, MD;  Location: [facility];  Service: Endoscopy;  Laterality: N/A;   JOINT REPLACEMENT Left 2007   knee    LEFT HEART CATH AND CORONARY ANGIOGRAPHY N/A 06/30/2017   Procedure: LEFT HEART CATH AND CORONARY ANGIOGRAPHY;  Surgeon: Nigel Mormon, MD;  Location: [facility];  Service: Cardiovascular;  Laterality: N/A;   left wrist plating   2005   titanium plate   right total knee replacement Right 2009   Social History   Tobacco Use   Smoking status: Never   Smokeless tobacco: Never  Substance Use Topics   Alcohol use: No    Family History  Problem Relation Age of Onset   Heart disease Mother    Hypertension Mother    Heart disease Father    Heart attack Father    Hypertension Father     Marital Status: Married  ROS  Review of Systems  Cardiovascular:  Positive for chest pain and dyspnea on exertion.   Objective      10/12/2022   10:00 AM 10/12/2022    9:00 AM 10/12/2022    8:00 AM  Vitals with BMI  Systolic [id number]  Diastolic 62 66 59  Pulse 82 89 75    Blood  pressure 122/62, pulse 82, temperature 98.6 F (37 C), temperature source Oral, resp. rate 18, height 5' 9.5" (1.765 m), weight 93 kg, SpO2 94 %.    Physical Exam Vitals reviewed.  HENT:     Head: Normocephalic and atraumatic.  Cardiovascular:     Rate and Rhythm: Normal rate and regular rhythm.     Heart sounds: Murmur heard.  Pulmonary:     Effort: Pulmonary effort is normal.     Breath sounds: Normal breath sounds.  Abdominal:     General: Bowel sounds are normal.  Musculoskeletal:     Right lower leg: No edema.     Left lower leg: No edema.  Skin:    General: Skin is warm and dry.  Neurological:     Mental Status: He is alert.    Laboratory examination:   Recent Labs    10/11/22 1823  NA 139  K 4.4  CL 105  CO2 25  GLUCOSE 88   BUN 26*  CREATININE 1.21  CALCIUM 8.9  GFRNONAA >60   estimated creatinine clearance is 56.2 mL/min (by C-G formula based on SCr of 1.21 mg/dL).     Latest Ref Rng & Units 10/11/2022    6:23 PM 02/21/2021   10:14 AM 01/25/2013    4:08 PM  CMP  Glucose 70 - 99 mg/dL 88  103  78   BUN 8 - 23 mg/dL 26  20  19    Creatinine 0.61 - 1.24 mg/dL 1.21  1.03  1.08   Sodium 135 - 145 mmol/L 139  142  140   Potassium 3.5 - 5.1 mmol/L 4.4  4.6  4.3   Chloride 98 - 111 mmol/L 105  106  103   CO2 22 - 32 mmol/L 25  23  26    Calcium 8.9 - 10.3 mg/dL 8.9  9.5  9.8   Total Protein 6.5 - 8.1 g/dL 6.4     Total Bilirubin 0.3 - 1.2 mg/dL 0.9     Alkaline Phos 38 - 126 U/L 64     AST 15 - 41 U/L 27     ALT 0 - 44 U/L 24         Latest Ref Rng & Units 10/11/2022    6:23 PM 01/25/2013    4:08 PM 01/07/2008    4:55 AM  CBC  WBC 4.0 - 10.5 K/uL 7.4  6.4  7.8   Hemoglobin 13.0 - 17.0 g/dL 14.0  14.9  10.0   Hematocrit 39.0 - 52.0 % 41.7  44.2  28.8   Platelets 150 - 400 K/uL 89  140  203    Lipid Panel No results for input(s): "CHOL", "TRIG", "LDLCALC", "VLDL", "HDL", "CHOLHDL", "LDLDIRECT" in the last 8760 hours.  HEMOGLOBIN A1C No results found for: "HGBA1C", "MPG" TSH No results for input(s): "TSH" in the last 8760 hours. BNP (last 3 results) No results for input(s): "BNP" in the last 8760 hours. Cardiac Panel (last 3 results) Recent Labs    10/11/22 1823 10/11/22 2119  TROPONINIHS 22* 24*     Medications and allergies  No Known Allergies   No outpatient medications have been marked as taking for the 10/11/22 encounter [facility] Encounter).    Scheduled Meds:  allopurinol  150 mg Oral Daily   aspirin EC  81 mg Oral Daily   finasteride  5 mg Oral QHS   [START ON 10/14/2022] irbesartan  150 mg Oral Daily   metoprolol succinate  25 mg Oral Daily  pantoprazole  80 mg Oral Daily   rosuvastatin  40 mg Oral Daily   Continuous Infusions:  heparin 1,200 Units/hr (10/12/22 0600)    nitroGLYCERIN 5 mcg/min (10/12/22 0600)   PRN Meds:.acetaminophen, ondansetron (ZOFRAN) IV, mouth rinse   I/O last 3 completed shifts: In: 115.5 [I.V.:115.5] Out: 1050 [Urine:1050] Total I/O In: 240 [P.O.:240] Out: 250 [Urine:250]  Net IO Since Admission: -944.54 mL [10/12/22 1319]   Radiology:   Imaging results have been reviewed and DG Chest 2 View  Result Date: 10/11/2022 CLINICAL DATA:  Chest EXAM: CHEST - 2 VIEW COMPARISON:  CXR 01/25/13 FINDINGS: No pleural effusion. No pneumothorax. No focal airspace opacity. Normal cardiac and mediastinal contours. No radiographically apparent displaced rib fractures. Visualized upper abdomen is unremarkable. Degenerative changes of the left AC joint and right glenohumeral joint. IMPRESSION: No focal airspace opacity. Electronically Signed   By: Marin Roberts M.D.   On: 10/11/2022 18:53    Cardiac Studies:   Echocardiogram 10/08/2022:  1. Normal LV systolic function with visual EF 60-65%. Left ventricle cavity is normal in size. Normal left ventricular wall thickness. Normal global wall motion. Normal diastolic filling pattern, normal LAP. Calculated EF 70%. 2. Left atrial cavity is slightly dilated. 3. Trileaflet aortic valve. Moderate (Grade III) aortic regurgitation. 4. Structurally normal mitral valve. Mild (Grade I) mitral regurgitation. 5. Structurally normal tricuspid valve. Mild tricuspid regurgitation. 6. Structurally normal pulmonic valve. Mild pulmonic regurgitation. 7. No significant change compared to 03/2022.   EKG 02/20/2022: Sinus rhythm 64 bpm Frequent PACs Low voltage in precordial leads   Abdominal Aortic Duplex  03/22/2020:  Diffuse plaque noted in the proximal, mid and distal aorta.  A distal abdominal aortic aneurysm measuring 2.61 x 2.6 x 2.67 cm is seen.  Normal bilateral internal iliac artery size and velocity. Recheck in 5  years for stability.    Coronary angiography 06/2017: Mild nonobstructive coronary artery  disease (Prox LAD 30%) with sluggish flow in all vessels. Normal filling pressures. LVEF 55-65%    CT abdomen 2017: 1. Continued diverticulitis in the descending colon. A collection of fluid and gas along the medial aspect of the descending colon, at the site of diverticulitis, is consistent with an intramural abscess or contained perforation. This may explain the patient's resistance to treatment. 2. 10 mm lingular nodule. This appears to be associated with adjacent scarring. Recommend a three to six-month follow-up CT of the chest to ensure stability or resolution. 3. 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 The left common iliac artery is also prominent measuring 1.5 cm. These results will be called to the ordering clinician or representative by the Radiologist Assistant, and communication documented in the PACS or zVision Dashboard.  EKG:  10/11/2022: Sinus rhythm with PACs, rate 87 bpm. Low voltage in precordial leads. Poor R-wave progression. No evidence of active ischemia.  Tele 3/30-3/31/2024: normal sinus rhythm with PACs. No dynamic EKG changes noted, no active ischemia.  Assessment & Recommendations:   Unstable angina No ST elevations or new depressions on EKG or tele. There have been no dynamic EKG changes since arrival.  Troponin elevated @ 22 --> 24 Continue on heparin and nitro gtt. Toprol XL 25 mg qDay started. Patient has chest pain with minimal acitivty. NPO after midnight, plan for LHC with possible intervention with Dr Virgina Jock tomorrow morning.   Hypertension BP has been soft since admission. I will hold his ARB until 4/2 since he will be going for [facility] with possible intervention tomorrow  morning with Dr Virgina Jock. Baseline Cr ~1.0, currently Cr is 1.21   Hyperlipidemia  Continue Crestor 40 mg      Floydene Flock, DO, [facility] 10/12/2022, 1:19 PM Office: [phone]

## 2022-10-13 ENCOUNTER — Encounter (HOSPITAL_COMMUNITY)
Admission: EM | Disposition: A | Discharge status: Home Health | Payer: Self-pay | Source: Other Acute Inpatient Hospital | Attending: Critical Care Medicine

## 2022-10-13 ENCOUNTER — Inpatient Hospital Stay (HOSPITAL_COMMUNITY): Payer: PPO

## 2022-10-13 ENCOUNTER — Encounter (HOSPITAL_COMMUNITY): Payer: Self-pay | Admitting: Cardiology

## 2022-10-13 DIAGNOSIS — I200 Unstable angina: Secondary | ICD-10-CM | POA: Diagnosis not present

## 2022-10-13 DIAGNOSIS — I2699 Other pulmonary embolism without acute cor pulmonale: Secondary | ICD-10-CM | POA: Diagnosis not present

## 2022-10-13 HISTORY — PX: LEFT HEART CATH AND CORONARY ANGIOGRAPHY: CATH118249

## 2022-10-13 LAB — CBC
HCT: 42.4 % (ref 39.0–52.0)
Hemoglobin: 13.7 g/dL (ref 13.0–17.0)
MCH: 29.8 pg (ref 26.0–34.0)
MCHC: 32.3 g/dL (ref 30.0–36.0)
MCV: 92.4 fL (ref 80.0–100.0)
Platelets: 80 10*3/uL — ABNORMAL LOW (ref 150–400)
RBC: 4.59 MIL/uL (ref 4.22–5.81)
RDW: 14.6 % (ref 11.5–15.5)
WBC: 7.5 10*3/uL (ref 4.0–10.5)
nRBC: 0 % (ref 0.0–0.2)

## 2022-10-13 LAB — MRSA NEXT GEN BY PCR, NASAL: MRSA by PCR Next Gen: NOT DETECTED

## 2022-10-13 LAB — TROPONIN I (HIGH SENSITIVITY)
Troponin I (High Sensitivity): 33 ng/L — ABNORMAL HIGH (ref <18)
Troponin I (High Sensitivity): 36 ng/L — ABNORMAL HIGH (ref <18)

## 2022-10-13 LAB — POCT ACTIVATED CLOTTING TIME: Activated Clotting Time: 309 seconds

## 2022-10-13 LAB — HEPARIN LEVEL (UNFRACTIONATED): Heparin Unfractionated: 0.44 IU/mL (ref 0.30–0.70)

## 2022-10-13 SURGERY — LEFT HEART CATH AND CORONARY ANGIOGRAPHY
Anesthesia: LOCAL

## 2022-10-13 MED ORDER — FENTANYL CITRATE (PF) 100 MCG/2ML IJ SOLN
INTRAMUSCULAR | Status: DC | PRN
  Administered 2022-10-13: 50 ug via INTRAVENOUS

## 2022-10-13 MED ORDER — SODIUM CHLORIDE 0.9 % IV BOLUS
1000.0000 mL | Freq: Once | INTRAVENOUS | Status: AC
  Administered 2022-10-13: 1000 mL via INTRAVENOUS

## 2022-10-13 MED ORDER — HYDRALAZINE HCL 20 MG/ML IJ SOLN: 10.0000 mg | INTRAMUSCULAR | Status: AC | PRN

## 2022-10-13 MED ORDER — MIDAZOLAM HCL 2 MG/2ML IJ SOLN
INTRAMUSCULAR | Status: DC | PRN
  Administered 2022-10-13: 1 mg via INTRAVENOUS

## 2022-10-13 MED ORDER — SODIUM CHLORIDE 0.9% FLUSH: 3.0000 mL | INTRAVENOUS | Status: DC | PRN

## 2022-10-13 MED ORDER — SODIUM CHLORIDE 0.9 % WEIGHT BASED INFUSION
3.0000 mL/kg/h | INTRAVENOUS | Status: AC
  Administered 2022-10-13: 3 mL/kg/h via INTRAVENOUS

## 2022-10-13 MED ORDER — VERAPAMIL HCL 2.5 MG/ML IV SOLN
INTRAVENOUS | Status: DC | PRN
  Administered 2022-10-13: 10 mL via INTRA_ARTERIAL

## 2022-10-13 MED ORDER — ASPIRIN 81 MG PO CHEW
81.0000 mg | CHEWABLE_TABLET | ORAL | Status: AC
  Administered 2022-10-13: 81 mg via ORAL
  Filled 2022-10-13: qty 1

## 2022-10-13 MED ORDER — ADENOSINE 12 MG/4ML IV SOLN
INTRAVENOUS | Status: AC
  Filled 2022-10-13: qty 4

## 2022-10-13 MED ORDER — SODIUM CHLORIDE 0.9% FLUSH: 3.0000 mL | Freq: Two times a day (BID) | INTRAVENOUS | Status: DC

## 2022-10-13 MED ORDER — CHLORHEXIDINE GLUCONATE CLOTH 2 % EX PADS
6.0000 | MEDICATED_PAD | Freq: Every day | CUTANEOUS | Status: DC
  Administered 2022-10-14 – 2022-10-24 (×9): 6 via TOPICAL

## 2022-10-13 MED ORDER — LABETALOL HCL 5 MG/ML IV SOLN: 10.0000 mg | INTRAVENOUS | Status: AC | PRN

## 2022-10-13 MED ORDER — IOHEXOL 350 MG/ML SOLN
75.0000 mL | Freq: Once | INTRAVENOUS | Status: AC | PRN
  Administered 2022-10-13: 75 mL via INTRAVENOUS

## 2022-10-13 MED ORDER — ONDANSETRON HCL 4 MG/2ML IJ SOLN: 4.0000 mg | Freq: Four times a day (QID) | INTRAMUSCULAR | Status: DC | PRN

## 2022-10-13 MED ORDER — SODIUM CHLORIDE 0.9 % IV SOLN: INTRAVENOUS | Status: AC

## 2022-10-13 MED ORDER — LIDOCAINE HCL (PF) 1 % IJ SOLN
INTRAMUSCULAR | Status: DC | PRN
  Administered 2022-10-13: 5 mL via INTRADERMAL

## 2022-10-13 MED ORDER — NITROGLYCERIN 1 MG/10 ML FOR IR/CATH LAB
INTRA_ARTERIAL | Status: AC
  Filled 2022-10-13: qty 10

## 2022-10-13 MED ORDER — HEPARIN (PORCINE) IN NACL 1000-0.9 UT/500ML-% IV SOLN
INTRAVENOUS | Status: DC | PRN
  Administered 2022-10-13 (×2): 500 mL

## 2022-10-13 MED ORDER — IOHEXOL 350 MG/ML SOLN
INTRAVENOUS | Status: DC | PRN
  Administered 2022-10-13: 72 mL

## 2022-10-13 MED ORDER — SODIUM CHLORIDE 0.9 % IV SOLN: 250.0000 mL | INTRAVENOUS | Status: DC | PRN

## 2022-10-13 MED ORDER — SODIUM CHLORIDE 0.9% FLUSH
3.0000 mL | Freq: Two times a day (BID) | INTRAVENOUS | Status: DC
  Administered 2022-10-13 – 2022-10-27 (×20): 3 mL via INTRAVENOUS

## 2022-10-13 MED ORDER — NITROGLYCERIN 1 MG/10 ML FOR IR/CATH LAB
INTRA_ARTERIAL | Status: DC | PRN
  Administered 2022-10-13: 200 ug via INTRACORONARY

## 2022-10-13 MED ORDER — ACETAMINOPHEN 325 MG PO TABS: 650.0000 mg | ORAL_TABLET | ORAL | Status: DC | PRN

## 2022-10-13 MED ORDER — MIDAZOLAM HCL 2 MG/2ML IJ SOLN
INTRAMUSCULAR | Status: AC
  Filled 2022-10-13: qty 2

## 2022-10-13 MED ORDER — SODIUM CHLORIDE 0.9 % WEIGHT BASED INFUSION
1.0000 mL/kg/h | INTRAVENOUS | Status: DC
  Administered 2022-10-13: 1 mL/kg/h via INTRAVENOUS

## 2022-10-13 MED ORDER — FENTANYL CITRATE (PF) 100 MCG/2ML IJ SOLN
INTRAMUSCULAR | Status: AC
  Filled 2022-10-13: qty 2

## 2022-10-13 MED ORDER — ADENOSINE (DIAGNOSTIC) 140MCG/KG/MIN
INTRAVENOUS | Status: AC | PRN
  Administered 2022-10-13: 140 ug/kg/min via INTRAVENOUS

## 2022-10-13 MED ORDER — HEPARIN SODIUM (PORCINE) 1000 UNIT/ML IJ SOLN
INTRAMUSCULAR | Status: AC
  Filled 2022-10-13: qty 10

## 2022-10-13 MED ORDER — VERAPAMIL HCL 2.5 MG/ML IV SOLN
INTRAVENOUS | Status: AC
  Filled 2022-10-13: qty 2

## 2022-10-13 MED ORDER — LIDOCAINE HCL (PF) 1 % IJ SOLN
INTRAMUSCULAR | Status: AC
  Filled 2022-10-13: qty 30

## 2022-10-13 MED ORDER — HEPARIN SODIUM (PORCINE) 1000 UNIT/ML IJ SOLN
INTRAMUSCULAR | Status: DC | PRN
  Administered 2022-10-13 (×2): 5000 [IU] via INTRAVENOUS

## 2022-10-13 SURGICAL SUPPLY — 14 items
CATH 5FR JL3.5 JR4 ANG PIG MP (CATHETERS) IMPLANT
CATH LAUNCHER 6FR EBU3.5 (CATHETERS) IMPLANT
DEVICE RAD COMP TR BAND LRG (VASCULAR PRODUCTS) IMPLANT
GLIDESHEATH SLEND A-KIT 6F 22G (SHEATH) IMPLANT
GUIDEWIRE INQWIRE 1.5J.035X260 (WIRE) IMPLANT
GUIDEWIRE PRESSURE X 175 (WIRE) IMPLANT
INQWIRE 1.5J .035X260CM (WIRE) ×1
KIT ESSENTIALS PG (KITS) IMPLANT
KIT HEART LEFT (KITS) ×1 IMPLANT
KIT HEMO VALVE WATCHDOG (MISCELLANEOUS) IMPLANT
PACK CARDIAC CATHETERIZATION (CUSTOM PROCEDURE TRAY) ×1 IMPLANT
SYR MEDRAD MARK 7 150ML (SYRINGE) ×1 IMPLANT
TRANSDUCER W/STOPCOCK (MISCELLANEOUS) ×1 IMPLANT
TUBING CIL FLEX 10 FLL-RA (TUBING) ×1 IMPLANT

## 2022-10-13 NOTE — Progress Notes (Signed)
Interventional Radiology Brief Update  82 year old male with history of remote, provoked DVT/PE presenting with acute, intermediate-high risk pulmonary embolism with saddle morphology.  He is compensating well with no supplemental oxygen requirement, normotensive, no tachycardia.  He is on heparin gtt.  He has not had a lower extremity venous duplex yet.  He has no absolute contraindications for thrombolytic therapy.   I discussed natural history of VTE and treatment options with him and his family at bedside including anticoagulation alone, catheter directed thrombolysis, and catheter directed thrombectomy.    We will plan to continue anticoagulation overnight and re-assess tomorrow morning.  If he decompensates from cardiopulmonary standpoint overnight, please notify me.  Please keep NPO.  Ruthann Cancer, MD Pager: [phone]

## 2022-10-13 NOTE — Progress Notes (Signed)
IV Hep gtt stopped. Patient is on his way to cath lab for procedure. Wife at bedside aware.

## 2022-10-13 NOTE — Progress Notes (Signed)
ANTICOAGULATION CONSULT NOTE  Pharmacy Consult for Heparin Indication: chest pain/ACS  No Known Allergies  Patient Measurements: Height: 5' 9.5" (176.5 cm) Weight: 93 kg (205 lb) IBW/kg (Calculated) : 71.85 Heparin Dosing Weight: 90.8 kg  Vital Signs: Temp: 98.6 F (37 C) (04/01 0323) Temp Source: Oral (04/01 0323) BP: 121/67 (04/01 0323) Pulse Rate: 78 (04/01 0323)  Labs: Recent Labs    10/11/22 1823 10/11/22 2119 10/12/22 0601 10/12/22 1522 10/13/22 0113  HGB 14.0  --   --   --  13.7  HCT 41.7  --   --   --  42.4  PLT 89*  --   --   --  80*  HEPARINUNFRC  --   --  0.46 0.49 0.44  CREATININE 1.21  --   --   --   --   TROPONINIHS 22* 24*  --   --   --      Estimated Creatinine Clearance: 56.2 mL/min (by C-G formula based on SCr of 1.21 mg/dL).   Medical History: Past Medical History:  Diagnosis Date   Coronary artery disease    GERD (gastroesophageal reflux disease)    Gout    History of hiatal hernia    Hypertension    Pulmonary embolism ([place]) 2008 after knee surgey   Renal insufficiency    hx renal failure 2008 after blood clots   Sleep apnea     Medications:  Medications Prior to Admission  Medication Sig Dispense Refill Last Dose   allopurinol (ZYLOPRIM) 300 MG tablet Take 150 mg by mouth daily.    10/11/2022   ascorbic acid (VITAMIN C) 500 MG tablet Take 500 mg by mouth daily.   10/11/2022   Cholecalciferol (VITAMIN D3) 5000 units CAPS Take 2,000 Units by mouth daily with supper.    10/11/2022   finasteride (PROSCAR) 5 MG tablet Take 5 mg by mouth at bedtime.    10/11/2022   fluticasone (FLONASE) 50 MCG/ACT nasal spray Place 1 spray into both nostrils every evening.   unk   magnesium gluconate (MAGONATE) 500 MG tablet Take 500 mg by mouth daily.   10/11/2022   nitroGLYCERIN (NITROSTAT) 0.4 MG SL tablet Place 0.4 mg under the tongue every 5 (five) minutes as needed for chest pain.   unk   Omega-3 Fatty Acids (FISH OIL) 1000 MG CAPS Take 1,000 mg by mouth  daily.   10/11/2022   omeprazole (PRILOSEC) 40 MG capsule Take 1 capsule (40 mg total) by mouth daily. 90 capsule 3 10/11/2022   rosuvastatin (CRESTOR) 20 MG tablet Take 1 tablet (20 mg total) by mouth daily. 90 tablet 3 10/11/2022   telmisartan (MICARDIS) 80 MG tablet Take 40 mg by mouth daily.   10/11/2022   zinc gluconate 50 MG tablet Take 50 mg by mouth daily.   10/11/2022    Scheduled:   allopurinol  150 mg Oral Daily   aspirin EC  81 mg Oral Daily   finasteride  5 mg Oral QHS   [START ON 10/14/2022] irbesartan  150 mg Oral Daily   metoprolol succinate  25 mg Oral Daily   pantoprazole  80 mg Oral Daily   rosuvastatin  40 mg Oral Daily   sodium chloride flush  3 mL Intravenous Q12H   Infusions:   sodium chloride     sodium chloride 1 mL/kg/hr (10/13/22 0705)   heparin 1,200 Units/hr (10/13/22 0023)   nitroGLYCERIN 5 mcg/min (10/13/22 0023)   PRN:   Assessment: 38 yom with a history  of HTN, HLD, mild non-obstructive CAD. Patient is presenting with chest pain and SOB. Heparin per pharmacy consult placed for chest pain/ACS.Patient is not on anticoagulation prior to arrival.  Heparin level 0.44 units/mL (therapeutic) on heparin 1200 units/hr. CBC stable with plt 80 and no signs of bleeding. Planning for [facility] today.  Goal of Therapy:  Heparin level 0.3-0.7 units/ml Monitor platelets by anticoagulation protocol: Yes   Plan:  Cont heparin 1200 units/hr Daily heparin level and CBC Continue to monitor H&H and platelets F/u LHC for anticoagulation plans  Erskine Speed, PharmD Clinical Pharmacist 10/13/2022 8:02 AM

## 2022-10-13 NOTE — Progress Notes (Addendum)
Patient c/o acute stabbing pain behind left shoulder, making him feeling like he can't breath. VSS. EKG completed. Dr. Doristine Bosworth notified. During assessment, pt c/o lessen pain 4/10 but pain now radiating to left bicept, able to breathe.Wife at bedside. Right radial site remains CDI, no bleeding noted any where. Patient also shared that he has a rotator cuff cuff tear on the left shoulder. Tylenol admin for pain.

## 2022-10-13 NOTE — Progress Notes (Signed)
PROGRESS NOTE    Ralph Blackwell  [id number] DOB: 1941-05-23 DOA: 10/11/2022 PCP: Corliss Blacker, MD   Brief Narrative:  Mr. Ralph Blackwell is a 82 year old gentleman who presented emergency department with chest pain, shortness of breath, diuresis. He was given nitroglycerin, He was also eventually placed on Nitropaste. He presented to the ER due to concern for unstable angina was started on heparin drip.  Patient admitted to hospital service, due to concern of unstable angina and CAD, cardiology consulted and he underwent cardiac cath on 10/13/2022 with clean coronaries.  Assessment & Plan:   Principal Problem:   Unstable angina Active Problems:   Essential hypertension   Mixed hyperlipidemia   Thrombocytopenia   Atypical angina  Unstable angina: Underwent cardiac cath, was found to have clean coronaries.  Per cardiology, patient had echo done in the office just a few days ago and had normal right ventricle pressures however echo done here on 10/12/2022 shows significantly elevated pulmonary pressures raising concerns for possible PE.  Patient has a history of PE in the past and he recently traveled to [place] as well.  Cardiology recommends performing CTA.  I have ordered normal saline fluid bolus of 1 L over 4 hours since he just had cardiac cath and then he will have CTA.  Thrombocytopenia, chronic: Stable.  Mixed hyperlipidemia: Continue statin.  Essential hypertension: Controlled.  Continue Avapro and Toprol-XL.  GERD: Continue PPI.  DVT prophylaxis: SCD's Start: 10/13/22 1110 heparin   Code Status: Full Code  Family Communication: Wife present at bedside.  Plan of care discussed with patient in length and he/she verbalized understanding and agreed with it.  Status is: Inpatient Remains inpatient appropriate because: Pending CTA.  If negative, plan for right heart cath tomorrow morning.   Estimated body mass index is 29.84 kg/m as calculated from the following:   Height as  of this encounter: 5' 9.5" (1.765 m).   Weight as of this encounter: 93 kg.    Nutritional Assessment: Body mass index is 29.84 kg/m.Marland Kitchen Seen by dietician.  I agree with the assessment and plan as outlined below: Nutrition Status:        . Skin Assessment: I have examined the patient's skin and I agree with the wound assessment as performed by the wound care RN as outlined below:    Consultants:  Cardiology  Procedures:  As above  Antimicrobials:  Anti-infectives (From admission, onward)    None         Subjective: Patient seen and examined postcardiac cath.  Wife at the bedside.  Patient states that he feels better.  Very minimal chest pain but no shortness of breath.  Objective: Vitals:   10/13/22 0323 10/13/22 0900 10/13/22 0944 10/13/22 1100  BP: 121/67 129/83  (!) 111/91  Pulse: 78 76  74  Resp: 18 (!) 23  16  Temp: 98.6 F (37 C) 98.1 F (36.7 C)  97.7 F (36.5 C)  TempSrc: Oral Oral  Oral  SpO2: 91% 95% 95% 94%  Weight:      Height:        Intake/Output Summary (Last 24 hours) at 10/13/2022 1151 Last data filed at 10/13/2022 0906 Gross per 24 hour  Intake 232.68 ml  Output 1400 ml  Net -1167.32 ml   Filed Weights   10/11/22 1821  Weight: 93 kg    Examination:  General exam: Appears calm and comfortable  Respiratory system: Clear to auscultation. Respiratory effort normal. Cardiovascular system: S1 & S2 heard, RRR. No  JVD, murmurs, rubs, gallops or clicks. No pedal edema. Gastrointestinal system: Abdomen is nondistended, soft and nontender. No organomegaly or masses felt. Normal bowel sounds heard. Central nervous system: Alert and oriented. No focal neurological deficits. Extremities: Symmetric 5 x 5 power. Skin: No rashes, lesions or ulcers Psychiatry: Judgement and insight appear normal. Mood & affect appropriate.    Data Reviewed: I have personally reviewed following labs and imaging studies  CBC: Recent Labs  Lab 10/11/22 1823  10/13/22 0113  WBC 7.4 7.5  HGB 14.0 13.7  HCT 41.7 42.4  MCV 91.6 92.4  PLT 89* 80*   Basic Metabolic Panel: Recent Labs  Lab 10/11/22 1823  NA 139  K 4.4  CL 105  CO2 25  GLUCOSE 88  BUN 26*  CREATININE 1.21  CALCIUM 8.9   GFR: Estimated Creatinine Clearance: 56.2 mL/min (by C-G formula based on SCr of 1.21 mg/dL). Liver Function Tests: Recent Labs  Lab 10/11/22 1823  AST 27  ALT 24  ALKPHOS 64  BILITOT 0.9  PROT 6.4*  ALBUMIN 3.4*   No results for input(s): "LIPASE", "AMYLASE" in the last 168 hours. No results for input(s): "AMMONIA" in the last 168 hours. Coagulation Profile: No results for input(s): "INR", "PROTIME" in the last 168 hours. Cardiac Enzymes: No results for input(s): "CKTOTAL", "CKMB", "CKMBINDEX", "TROPONINI" in the last 168 hours. BNP (last 3 results) No results for input(s): "PROBNP" in the last 8760 hours. HbA1C: No results for input(s): "HGBA1C" in the last 72 hours. CBG: No results for input(s): "GLUCAP" in the last 168 hours. Lipid Profile: No results for input(s): "CHOL", "HDL", "LDLCALC", "TRIG", "CHOLHDL", "LDLDIRECT" in the last 72 hours. Thyroid Function Tests: No results for input(s): "TSH", "T4TOTAL", "FREET4", "T3FREE", "THYROIDAB" in the last 72 hours. Anemia Panel: No results for input(s): "VITAMINB12", "FOLATE", "FERRITIN", "TIBC", "IRON", "RETICCTPCT" in the last 72 hours. Sepsis Labs: No results for input(s): "PROCALCITON", "LATICACIDVEN" in the last 168 hours.  No results found for this or any previous visit (from the past 240 hour(s)).   Radiology Studies: CARDIAC CATHETERIZATION  Result Date: 10/13/2022 Images from the original result were not included. LM: Normal LAD: Minimal luminal irregularities Lcx: Large, dominant. Normal RCA: Small, nondominant.  Normal LVEDP normal Resting Pd/Pa: 0.93 FFR: 0.88 CFR: 4.5 IMR: 27 (only marginally abnormal) No angiographically evident significant coronary artery disease Nearly  normal microvascular indicis Above findings do not explain the severity of patient's chest pain shortness of breath. I personally reviewed and compared echocardiograms.  Echocardiogram performed outpatient last week showed RVSP of 28 mmHg.  RVSP was noted to be 69 mmHg on inpatient echocardiogram yesterday.  Patient reportedly has history of PEs in the past. Given significant only different RVSP, patient's profound symptoms not explained by coronary or cardiac etiology, I strongly suspect acute pulmonary cause such as pulmonary embolism.  I personally discussed this with patient and patient's wife, as well as hospitalist Dr. Parks Ranger.  We will proceed with CT angiogram chest, with additional hydration.  If CT angiogram does not reveal acute PE, I will perform right heart catheterization tomorrow. Nigel Mormon, MD Pager: [phone] Office: [phone]  ECHOCARDIOGRAM COMPLETE  Result Date: 10/12/2022    ECHOCARDIOGRAM REPORT   Patient Name:   Ralph Blackwell Date of Exam: 10/12/2022 Medical Rec #:  [id number]       Height:       69.5 in Accession #:    [id number]      Weight:       205.0  lb Date of Birth:  06/27/1941       BSA:          2.098 m Patient Age:    44 years        BP:           97/61 mmHg Patient Gender: M               HR:           90 bpm. Exam Location:  Inpatient Procedure: 2D Echo, Cardiac Doppler and Color Doppler Indications:    Chest pain R07.9  History:        Patient has prior history of Echocardiogram examinations, most                 recent 10/09/2022. CAD; Risk Factors:Hypertension, Dyslipidemia                 and Non-Smoker.  Sonographer:    Wilkie Aye RVT RCS Referring Phys: [id number] [place]  1. Left ventricular ejection fraction, by estimation, is 65 to 70%. The left ventricle has normal function. The left ventricle has no regional wall motion abnormalities. Left ventricular diastolic parameters are consistent with Grade I diastolic dysfunction (impaired  relaxation).  2. Right ventricular systolic function is normal. The right ventricular size is normal. There is severely elevated pulmonary artery systolic pressure. The estimated right ventricular systolic pressure is 123456 mmHg.  3. The mitral valve is grossly normal. Mild mitral valve regurgitation. No evidence of mitral stenosis.  4. The tricuspid valve is abnormal. Tricuspid valve regurgitation is moderate.  5. The aortic valve is tricuspid. There is mild calcification of the aortic valve. Aortic valve regurgitation is mild. No aortic stenosis is present.  6. There is Moderate (Grade III) plaque involving the descending aorta.  7. The inferior vena cava is normal in size with <50% respiratory variability, suggesting right atrial pressure of 8 mmHg. Comparison(s): No prior Echocardiogram. FINDINGS  Left Ventricle: Left ventricular ejection fraction, by estimation, is 65 to 70%. The left ventricle has normal function. The left ventricle has no regional wall motion abnormalities. The left ventricular internal cavity size was normal in size. There is  no left ventricular hypertrophy. Left ventricular diastolic parameters are consistent with Grade I diastolic dysfunction (impaired relaxation). Right Ventricle: The right ventricular size is normal. No increase in right ventricular wall thickness. Right ventricular systolic function is normal. There is severely elevated pulmonary artery systolic pressure. The tricuspid regurgitant velocity is 3.91 m/s, and with an assumed right atrial pressure of 8 mmHg, the estimated right ventricular systolic pressure is 123456 mmHg. Left Atrium: Left atrial size was normal in size. Right Atrium: Right atrial size was normal in size. Pericardium: There is no evidence of pericardial effusion. Mitral Valve: The mitral valve is grossly normal. Mild mitral valve regurgitation. No evidence of mitral valve stenosis. Tricuspid Valve: The tricuspid valve is abnormal. Tricuspid valve  regurgitation is moderate . No evidence of tricuspid stenosis. Aortic Valve: The aortic valve is tricuspid. There is mild calcification of the aortic valve. Aortic valve regurgitation is mild. No aortic stenosis is present. Aortic valve mean gradient measures 3.0 mmHg. Aortic valve peak gradient measures 5.9 mmHg. Aortic valve area, by VTI measures 2.70 cm. Pulmonic Valve: The pulmonic valve was not well visualized. Pulmonic valve regurgitation is mild. No evidence of pulmonic stenosis. Aorta: The aortic root and ascending aorta are structurally normal, with no evidence of dilitation. There is moderate (Grade III) plaque involving  the descending aorta. Venous: The inferior vena cava is normal in size with less than 50% respiratory variability, suggesting right atrial pressure of 8 mmHg. IAS/Shunts: No atrial level shunt detected by color flow Doppler.  LEFT VENTRICLE PLAX 2D LVIDd:         4.40 cm   Diastology LVIDs:         2.60 cm   LV e' medial:    7.88 cm/s LV PW:         0.90 cm   LV E/e' medial:  5.7 LV IVS:        0.90 cm   LV e' lateral:   8.15 cm/s LVOT diam:     2.10 cm   LV E/e' lateral: 5.5 LV SV:         53 LV SV Index:   25 LVOT Area:     3.46 cm  RIGHT VENTRICLE             IVC RV Basal diam:  3.30 cm     IVC diam: 1.60 cm RV Mid diam:    3.20 cm RV S prime:     16.20 cm/s TAPSE (M-mode): 1.5 cm LEFT ATRIUM             Index        RIGHT ATRIUM           Index LA diam:        3.80 cm 1.81 cm/m   RA Area:     16.00 cm LA Vol (A2C):   60.2 ml 28.69 ml/m  RA Volume:   47.40 ml  22.59 ml/m LA Vol (A4C):   53.6 ml 25.55 ml/m LA Biplane Vol: 58.6 ml 27.93 ml/m  AORTIC VALVE                    PULMONIC VALVE AV Area (Vmax):    2.61 cm     PV Vmax:       0.74 m/s AV Area (Vmean):   2.49 cm     PV Peak grad:  2.2 mmHg AV Area (VTI):     2.70 cm AV Vmax:           121.50 cm/s AV Vmean:          80.600 cm/s AV VTI:            0.198 m AV Peak Grad:      5.9 mmHg AV Mean Grad:      3.0 mmHg LVOT Vmax:          91.53 cm/s LVOT Vmean:        57.867 cm/s LVOT VTI:          0.154 m LVOT/AV VTI ratio: 0.78 AR Vena Contracta: 0.40 cm  AORTA Ao Root diam: 3.40 cm Ao Asc diam:  3.30 cm Ao Arch diam: 3.3 cm MITRAL VALVE               TRICUSPID VALVE MV Area (PHT): 2.77 cm    TR Peak grad:   61.2 mmHg MV Decel Time: 274 msec    TR Vmax:        391.00 cm/s MV E velocity: 45.10 cm/s MV A velocity: 57.10 cm/s  SHUNTS MV E/A ratio:  0.79        Systemic VTI:  0.15 m  Systemic Diam: 2.10 cm Vishnu Priya Mallipeddi Electronically signed by Lorelee Cover Mallipeddi Signature Date/Time: 10/12/2022/3:05:20 PM    Final    DG Chest 2 View  Result Date: 10/11/2022 CLINICAL DATA:  Chest EXAM: CHEST - 2 VIEW COMPARISON:  CXR 01/25/13 FINDINGS: No pleural effusion. No pneumothorax. No focal airspace opacity. Normal cardiac and mediastinal contours. No radiographically apparent displaced rib fractures. Visualized upper abdomen is unremarkable. Degenerative changes of the left AC joint and right glenohumeral joint. IMPRESSION: No focal airspace opacity. Electronically Signed   By: Marin Roberts M.D.   On: 10/11/2022 18:53    Scheduled Meds:  allopurinol  150 mg Oral Daily   aspirin EC  81 mg Oral Daily   finasteride  5 mg Oral QHS   [START ON 10/14/2022] irbesartan  150 mg Oral Daily   metoprolol succinate  25 mg Oral Daily   nitroGLYCERIN       pantoprazole  80 mg Oral Daily   rosuvastatin  40 mg Oral Daily   sodium chloride flush  3 mL Intravenous Q12H   Continuous Infusions:  sodium chloride     sodium chloride     heparin 1,200 Units/hr (10/13/22 0023)   nitroGLYCERIN 5 mcg/min (10/13/22 0023)     LOS: 1 day   Darliss Cheney, MD [facility]  10/13/2022, 11:51 AM   *Please note that this is a verbal dictation therefore any spelling or grammatical errors are due to the "[place] [employer]" system interpretation.  Please page via [place] and do not message via secure chat for urgent  patient care matters. Secure chat can be used for non urgent patient care matters.  How to contact the [facility] Attending or Consulting provider [place] or covering provider during after hours [place], for this patient?  Check the care team in [facility] and look for a) attending/consulting [facility] provider listed and b) the [facility] team listed. Page or secure chat [phone]. Log into [email or web address] and use [place]'s universal password to access. If you do not have the password, please contact the hospital operator. Locate the [facility] provider you are looking for under [facility] and page to a number that you can be directly reached. If you still have difficulty reaching the provider, please page the [facility] (Director on Call) for the Hospitalists listed on amion for assistance.

## 2022-10-13 NOTE — Consult Note (Signed)
NAME:  Ralph Blackwell, MRN:  [id number], DOB:  1941/05/12, LOS: 1 ADMISSION DATE:  10/11/2022, CONSULTATION DATE:  10/13/22 REFERRING MD:  Dr. Doristine Bosworth, CHIEF COMPLAINT:  SOB, Chest Pain   History of Present Illness:  82 y/o M who presented to [facility] ER on 3/30 with reports of shortness of breath over several weeks.  The patient reports they traveled approximately 3 weeks ago to the [place].  While on vacation he walked 2 miles per day.  He typically is normally very active.  He notes when he returned home he was taking his dog out to go to to the bathroom and he developed increasing shortness of breath and chest tightness.  He describes it as a "vice on his chest".  He states he did not feel like he was good to be able to make it back into the house.  He attempted sublingual nitro and he did have some improvement in symptoms.  Noting chest pain radiated to both arms.  He was evaluated by Cardiology on presentation and was thought to have unstable angina.  In route with EMS the patient was reportedly very diaphoretic and short of breath.  He was started on nitro and heparin infusion.  He continued to have chest pain with shortness of breath with minimal activity such as brushing teeth.  He was taken for left heart cath on 4/1 with findings of no angiographically evident significant coronary artery disease.  No acute findings to suggest cause of patient's chest pain.  Prior echocardiogram performed approximately a week before admission showed an RVSP of 28 mmHg.  Repeat ECHO this admission showed RVSP of 69 mmHg.  The patient subsequently had a CT angio of the chest which was positive for saddle PE with right greater than left pulmonary emboli, noted CT evidence of right heart strain with RV LV ratio of 1.18 consistent with at least submassive PE.  Patient was evaluated by interventional radiology.  PCCM consulted for evaluation    Pertinent  Medical History  CAD HTN Pulmonary embolism-2008 after knee  surgery, subsequent fall with wrist fracture OSA Renal insufficiency GERD Gout Hiatal hernia  Significant Hospital Events: Including procedures, antibiotic start and stop dates in addition to other pertinent events   3/30 admit with SOB, chest pain 4/1 LHC with no angiographically evident coronary artery disease to suggest cause of chest pain.  Noted elevated RVSP on echo.  CTA completed & positive for saddle PE. IR/PCCM consulted.   Interim History / Subjective:  As above  Sats 94% on RA Reports he just got up and became diaphoretic/clammy, felt like his chest was being squeezed/tight, lightheaded but did not pass out.   Objective   Blood pressure 132/70, pulse 74, temperature 98.1 F (36.7 C), temperature source Oral, resp. rate 12, height 5' 9.5" (1.765 m), weight 93 kg, SpO2 96 %.        Intake/Output Summary (Last 24 hours) at 10/13/2022 1750 Last data filed at 10/13/2022 1324 Gross per 24 hour  Intake 592.68 ml  Output 1400 ml  Net -807.32 ml   Filed Weights   10/11/22 1821  Weight: 93 kg    Examination: General: adult male sitting up in chair in NAD HENT: MM pink/moist, anicteric, pupils =/reactive Lungs: non-labored at rest, lungs bilaterally clear Cardiovascular: S1S2 RRR, SR 70's Abdomen: soft/non-tender, bsx4 active  Extremities: warm/dry, no LE edema or calf tenderness Neuro: AAOx4, MAE, non-focal    Resolved Hospital Problem list      Assessment &  Plan:   Acute Submassive Pulmonary Embolism  Chest Pain / Dyspnea secondary to Above  Hx Prior PE (2008, provoked after surgery) Submassive PE/saddle on imaging, has fortunately been on heparin infusion since admission for possible ACS. Remains intermittently symptomatic with chest pressure, lightheadedness and shortness of breath. Prior RVSP ~28, currently 69.  RV/LV ratio 1.18.  T wave inversion in lead III.   -continue heparin infusion per Pharmacy -appreciate IR consultation for consideration of catheter  directed thrombolytics  -O2 to support sats >92% -repeat troponin now given period of lightheadedness / ongoing chest pain  -assess LE venous duplex for completeness -transfer to ICU for observation  -reviewed risks of full dose tPA, indications for same. Currently hemodynamically stable.    Chronic Thrombocytopenia  -stable, follow trend   CAD  Mixed Hyperlipidemia  S/p LHC with no significant disease burden  -statin  -continue avapro, toprol XL (note beta blocker may be masking some of tachycardia from PE) GERD  -PPI   Best Practice (right click and "Reselect all SmartList Selections" daily)  Diet/type: NPO DVT prophylaxis: systemic heparin GI prophylaxis: PPI Lines: N/A Foley:  N/A Code Status:  full code Last date of multidisciplinary goals of care discussion: full code  Labs   CBC: Recent Labs  Lab 10/11/22 1823 10/13/22 0113  WBC 7.4 7.5  HGB 14.0 13.7  HCT 41.7 42.4  MCV 91.6 92.4  PLT 89* 80*    Basic Metabolic Panel: Recent Labs  Lab 10/11/22 1823  NA 139  K 4.4  CL 105  CO2 25  GLUCOSE 88  BUN 26*  CREATININE 1.21  CALCIUM 8.9   GFR: Estimated Creatinine Clearance: 56.2 mL/min (by C-G formula based on SCr of 1.21 mg/dL). Recent Labs  Lab 10/11/22 1823 10/13/22 0113  WBC 7.4 7.5    Liver Function Tests: Recent Labs  Lab 10/11/22 1823  AST 27  ALT 24  ALKPHOS 64  BILITOT 0.9  PROT 6.4*  ALBUMIN 3.4*   No results for input(s): "LIPASE", "AMYLASE" in the last 168 hours. No results for input(s): "AMMONIA" in the last 168 hours.  ABG    Component Value Date/Time   TCO2 19 01/04/2008 0020     Coagulation Profile: No results for input(s): "INR", "PROTIME" in the last 168 hours.  Cardiac Enzymes: No results for input(s): "CKTOTAL", "CKMB", "CKMBINDEX", "TROPONINI" in the last 168 hours.  HbA1C: No results found for: "HGBA1C"  CBG: No results for input(s): "GLUCAP" in the last 168 hours.  Review of Systems: Positives in  [place]  Gen: Denies fever, chills, weight change, fatigue, night sweats HEENT: Denies blurred vision, double vision, hearing loss, tinnitus, sinus congestion, rhinorrhea, sore throat, neck stiffness, dysphagia PULM: Denies bilateral chest pain, shortness of breath, cough, sputum production, hemoptysis, wheezing CV: Denies chest pain, edema, orthopnea, paroxysmal nocturnal dyspnea, palpitations GI: Denies abdominal pain, nausea, vomiting, diarrhea, hematochezia, melena, constipation, change in bowel habits GU: Denies dysuria, hematuria, polyuria, oliguria, urethral discharge Endocrine: Denies hot or cold intolerance, polyuria, polyphagia or appetite change Derm: Denies rash, dry skin, scaling or peeling skin change Heme: Denies easy bruising, bleeding, bleeding gums Neuro: Denies headache, numbness, weakness, slurred speech, loss of memory or consciousness  Past Medical History:  He,  has a past medical history of Coronary artery disease, GERD (gastroesophageal reflux disease), Gout, History of hiatal hernia, Hypertension, Pulmonary embolism (2008 after knee surgey), Renal insufficiency, and Sleep apnea.   Surgical History:   Past Surgical History:  Procedure Laterality Date   arthroscopic  knee surgery  2004   BACK SURGERY  nov 1979 and 1998   lower   COLONOSCOPY WITH PROPOFOL N/A 10/20/2016   Procedure: COLONOSCOPY WITH PROPOFOL;  Surgeon: Garlan Fair, MD;  Location: [facility];  Service: Endoscopy;  Laterality: N/A;   JOINT REPLACEMENT Left 2007   knee    LEFT HEART CATH AND CORONARY ANGIOGRAPHY N/A 06/30/2017   Procedure: LEFT HEART CATH AND CORONARY ANGIOGRAPHY;  Surgeon: Nigel Mormon, MD;  Location: [facility];  Service: Cardiovascular;  Laterality: N/A;   LEFT HEART CATH AND CORONARY ANGIOGRAPHY N/A 10/13/2022   Procedure: LEFT HEART CATH AND CORONARY ANGIOGRAPHY;  Surgeon: Nigel Mormon, MD;  Location: [place] [facility];  Service: Cardiovascular;   Laterality: N/A;   left wrist plating   2005   titanium plate   right total knee replacement Right 2009     Social History:   reports that he has never smoked. He has never used smokeless tobacco. He reports that he does not drink alcohol and does not use drugs.   Family History:  His family history includes Heart attack in his father; Heart disease in his father and mother; Hypertension in his father and mother.   Allergies No Known Allergies   Home Medications  Prior to Admission medications   Medication Sig Start Date End Date Taking? Authorizing Provider  allopurinol (ZYLOPRIM) 300 MG tablet Take 150 mg by mouth daily.    Yes [provider]  ascorbic acid (VITAMIN C) 500 MG tablet Take 500 mg by mouth daily.   Yes [provider]  Cholecalciferol (VITAMIN D3) 5000 units CAPS Take 2,000 Units by mouth daily with supper.    Yes [provider]  finasteride (PROSCAR) 5 MG tablet Take 5 mg by mouth at bedtime.    Yes [provider]  fluticasone (FLONASE) 50 MCG/ACT nasal spray Place 1 spray into both nostrils every evening.   Yes [provider]  magnesium gluconate (MAGONATE) 500 MG tablet Take 500 mg by mouth daily.   Yes [provider]  nitroGLYCERIN (NITROSTAT) 0.4 MG SL tablet Place 0.4 mg under the tongue every 5 (five) minutes as needed for chest pain.   Yes [provider]  Omega-3 Fatty Acids (FISH OIL) 1000 MG CAPS Take 1,000 mg by mouth daily.   Yes [provider]  omeprazole (PRILOSEC) 40 MG capsule Take 1 capsule (40 mg total) by mouth daily. 04/21/22  Yes Patwardhan, Manish J, MD  rosuvastatin (CRESTOR) 20 MG tablet Take 1 tablet (20 mg total) by mouth daily. 06/30/22 06/25/23 Yes Patwardhan, Manish J, MD  telmisartan (MICARDIS) 80 MG tablet Take 40 mg by mouth daily.   Yes [provider]  zinc gluconate 50 MG tablet Take 50 mg by mouth daily.   Yes [provider]     Critical  care time: 16 minutes    Noe Gens, MSN, APRN, NP-C, AGACNP-BC [facility] 10/13/2022, 5:50 PM   Please see [email or web address] for pager details.   From 7A-7P if no response, please call [phone] After hours, please call ELink [phone]

## 2022-10-13 NOTE — Interval H&P Note (Signed)
History and Physical Interval Note:  10/13/2022 9:53 AM  Ralph Blackwell  has presented today for surgery, with the diagnosis of unstable angina.  The various methods of treatment have been discussed with the patient and family. After consideration of risks, benefits and other options for treatment, the patient has consented to  Procedure(s): LEFT HEART CATH AND CORONARY ANGIOGRAPHY (N/A) as a surgical intervention.  The patient's history has been reviewed, patient examined, no change in status, stable for surgery.  I have reviewed the patient's chart and labs.  Questions were answered to the patient's satisfaction.    2016 Appropriate Use Criteria for Coronary Revascularization in Patients With Acute Coronary Syndrome NSTEMI/Unstable angina, stabilized patient at high risk Indication:  Revascularization by PCI or CABG of 1 or more arteries in a patient with NSTEMI or unstable angina with Stabilization after presentation High risk for clinical events A (7) Indication: 16; Score 7   [place]

## 2022-10-14 ENCOUNTER — Inpatient Hospital Stay (HOSPITAL_COMMUNITY): Payer: PPO

## 2022-10-14 DIAGNOSIS — I2699 Other pulmonary embolism without acute cor pulmonale: Secondary | ICD-10-CM | POA: Diagnosis not present

## 2022-10-14 DIAGNOSIS — I2609 Other pulmonary embolism with acute cor pulmonale: Secondary | ICD-10-CM

## 2022-10-14 DIAGNOSIS — D696 Thrombocytopenia, unspecified: Secondary | ICD-10-CM | POA: Diagnosis not present

## 2022-10-14 DIAGNOSIS — I200 Unstable angina: Secondary | ICD-10-CM | POA: Diagnosis not present

## 2022-10-14 DIAGNOSIS — I2694 Multiple subsegmental pulmonary emboli without acute cor pulmonale: Secondary | ICD-10-CM | POA: Diagnosis not present

## 2022-10-14 HISTORY — PX: IR INFUSION THROMBOL ARTERIAL INITIAL (MS): IMG5376

## 2022-10-14 HISTORY — PX: IR US GUIDE VASC ACCESS RIGHT: IMG2390

## 2022-10-14 HISTORY — PX: IR ANGIOGRAM SELECTIVE EACH ADDITIONAL VESSEL: IMG667

## 2022-10-14 HISTORY — PX: IR ANGIOGRAM PULMONARY BILATERAL SELECTIVE: IMG664

## 2022-10-14 LAB — BASIC METABOLIC PANEL
Anion gap: 9 (ref 5–15)
Anion gap: 11 (ref 5–15)
BUN: 17 mg/dL (ref 8–23)
BUN: 20 mg/dL (ref 8–23)
CO2: 24 mmol/L (ref 22–32)
CO2: 21 mmol/L — ABNORMAL LOW (ref 22–32)
Calcium: 9.1 mg/dL (ref 8.9–10.3)
Calcium: 8.7 mg/dL — ABNORMAL LOW (ref 8.9–10.3)
Chloride: 104 mmol/L (ref 98–111)
Chloride: 101 mmol/L (ref 98–111)
Creatinine, Ser: 1.04 mg/dL (ref 0.61–1.24)
Creatinine, Ser: 1.09 mg/dL (ref 0.61–1.24)
GFR, Estimated: >60 mL/min (ref >60)
GFR, Estimated: >60 mL/min (ref >60)
Glucose, Bld: 124 mg/dL — ABNORMAL HIGH (ref 70–99)
Glucose, Bld: 153 mg/dL — ABNORMAL HIGH (ref 70–99)
Potassium: 4.5 mmol/L (ref 3.5–5.1)
Potassium: 4.4 mmol/L (ref 3.5–5.1)
Sodium: 137 mmol/L (ref 135–145)
Sodium: 133 mmol/L — ABNORMAL LOW (ref 135–145)

## 2022-10-14 LAB — CBC
HCT: 43.1 % (ref 39.0–52.0)
HCT: 44.4 % (ref 39.0–52.0)
HCT: 42.3 % (ref 39.0–52.0)
Hemoglobin: 13.8 g/dL (ref 13.0–17.0)
Hemoglobin: 14.4 g/dL (ref 13.0–17.0)
Hemoglobin: 14.2 g/dL (ref 13.0–17.0)
MCH: 29.9 pg (ref 26.0–34.0)
MCH: 30.1 pg (ref 26.0–34.0)
MCH: 30.5 pg (ref 26.0–34.0)
MCHC: 32 g/dL (ref 30.0–36.0)
MCHC: 32.4 g/dL (ref 30.0–36.0)
MCHC: 33.6 g/dL (ref 30.0–36.0)
MCV: 93.3 fL (ref 80.0–100.0)
MCV: 92.9 fL (ref 80.0–100.0)
MCV: 90.8 fL (ref 80.0–100.0)
Platelets: 84 10*3/uL — ABNORMAL LOW (ref 150–400)
Platelets: 80 10*3/uL — ABNORMAL LOW (ref 150–400)
Platelets: 67 10*3/uL — ABNORMAL LOW (ref 150–400)
RBC: 4.62 MIL/uL (ref 4.22–5.81)
RBC: 4.78 MIL/uL (ref 4.22–5.81)
RBC: 4.66 MIL/uL (ref 4.22–5.81)
RDW: 14.6 % (ref 11.5–15.5)
RDW: 14.6 % (ref 11.5–15.5)
RDW: 14.4 % (ref 11.5–15.5)
WBC: 7.3 10*3/uL (ref 4.0–10.5)
WBC: 7.2 10*3/uL (ref 4.0–10.5)
WBC: 7.2 10*3/uL (ref 4.0–10.5)
nRBC: 0 % (ref 0.0–0.2)
nRBC: 0 % (ref 0.0–0.2)
nRBC: 0 % (ref 0.0–0.2)

## 2022-10-14 LAB — HEMOGLOBIN AND HEMATOCRIT, BLOOD
HCT: 41.6 % (ref 39.0–52.0)
Hemoglobin: 13.7 g/dL (ref 13.0–17.0)

## 2022-10-14 LAB — LACTIC ACID, PLASMA: Lactic Acid, Venous: 1.3 mmol/L (ref 0.5–1.9)

## 2022-10-14 LAB — BRAIN NATRIURETIC PEPTIDE: B Natriuretic Peptide: 39.8 pg/mL (ref 0.0–100.0)

## 2022-10-14 LAB — HEPARIN LEVEL (UNFRACTIONATED)
Heparin Unfractionated: 0.45 IU/mL (ref 0.30–0.70)
Heparin Unfractionated: 0.53 IU/mL (ref 0.30–0.70)
Heparin Unfractionated: 0.36 IU/mL (ref 0.30–0.70)

## 2022-10-14 LAB — FIBRINOGEN
Fibrinogen: 736 mg/dL — ABNORMAL HIGH (ref 210–475)
Fibrinogen: 727 mg/dL — ABNORMAL HIGH (ref 210–475)

## 2022-10-14 LAB — TROPONIN I (HIGH SENSITIVITY): Troponin I (High Sensitivity): 139 ng/L (ref <18)

## 2022-10-14 LAB — MAGNESIUM: Magnesium: 2.1 mg/dL (ref 1.7–2.4)

## 2022-10-14 MED ORDER — SODIUM CHLORIDE 0.9 % IV SOLN
12.0000 mg | Freq: Once | INTRAVENOUS | Status: AC
  Administered 2022-10-14: 12 mg via INTRAVENOUS
  Filled 2022-10-14: qty 12

## 2022-10-14 MED ORDER — FENTANYL CITRATE (PF) 100 MCG/2ML IJ SOLN
INTRAMUSCULAR | Status: AC | PRN
  Administered 2022-10-14 (×2): 25 ug via INTRAVENOUS

## 2022-10-14 MED ORDER — LIDOCAINE HCL 1 % IJ SOLN
INTRAMUSCULAR | Status: AC
  Filled 2022-10-14: qty 20

## 2022-10-14 MED ORDER — SODIUM CHLORIDE 0.9 % IV SOLN: 250.0000 mL | INTRAVENOUS | Status: DC | PRN

## 2022-10-14 MED ORDER — IOHEXOL 300 MG/ML  SOLN
50.0000 mL | Freq: Once | INTRAMUSCULAR | Status: AC | PRN
  Administered 2022-10-14: 30 mL via INTRAVENOUS

## 2022-10-14 MED ORDER — MIDAZOLAM HCL 2 MG/2ML IJ SOLN
INTRAMUSCULAR | Status: AC | PRN
  Administered 2022-10-14: .5 mg via INTRAVENOUS
  Administered 2022-10-14: 1 mg via INTRAVENOUS

## 2022-10-14 MED ORDER — SODIUM CHLORIDE 0.9 % IV SOLN: INTRAVENOUS | Status: DC

## 2022-10-14 MED ORDER — FENTANYL CITRATE (PF) 100 MCG/2ML IJ SOLN
INTRAMUSCULAR | Status: AC
  Filled 2022-10-14: qty 2

## 2022-10-14 MED ORDER — SODIUM CHLORIDE 0.9% FLUSH
3.0000 mL | Freq: Two times a day (BID) | INTRAVENOUS | Status: DC
  Administered 2022-10-14 – 2022-10-27 (×19): 3 mL via INTRAVENOUS

## 2022-10-14 MED ORDER — SODIUM CHLORIDE 0.9% FLUSH: 3.0000 mL | INTRAVENOUS | Status: DC | PRN

## 2022-10-14 MED ORDER — MIDAZOLAM HCL 2 MG/2ML IJ SOLN
INTRAMUSCULAR | Status: AC
  Filled 2022-10-14: qty 2

## 2022-10-14 NOTE — Progress Notes (Signed)
NAME:  Ralph Blackwell, MRN:  [id number], DOB:  May 22, 1941, LOS: 2 ADMISSION DATE:  10/11/2022, CONSULTATION DATE:  10/13/22 REFERRING MD:  Dr. Doristine Bosworth, CHIEF COMPLAINT:  SOB, Chest Pain   History of Present Illness:  82 y/o M who presented to [facility] ER on 3/30 with reports of shortness of breath over several weeks.  The patient reports they traveled approximately 3 weeks ago to the [place].  While on vacation he walked 2 miles per day.  He typically is normally very active.  He notes when he returned home he was taking his dog out to go to to the bathroom and he developed increasing shortness of breath and chest tightness.  He describes it as a "vice on his chest".  He states he did not feel like he was good to be able to make it back into the house.  He attempted sublingual nitro and he did have some improvement in symptoms.  Noting chest pain radiated to both arms.  He was evaluated by Cardiology on presentation and was thought to have unstable angina.  In route with EMS the patient was reportedly very diaphoretic and short of breath.  He was started on nitro and heparin infusion.  He continued to have chest pain with shortness of breath with minimal activity such as brushing teeth.  He was taken for left heart cath on 4/1 with findings of no angiographically evident significant coronary artery disease.  No acute findings to suggest cause of patient's chest pain.  Prior echocardiogram performed approximately a week before admission showed an RVSP of 28 mmHg.  Repeat ECHO this admission showed RVSP of 69 mmHg.  The patient subsequently had a CT angio of the chest which was positive for saddle PE with right greater than left pulmonary emboli, noted CT evidence of right heart strain with RV LV ratio of 1.18 consistent with at least submassive PE.  Patient was evaluated by interventional radiology.  PCCM consulted for evaluation    Pertinent  Medical History  CAD HTN Pulmonary embolism-2008 after knee  surgery, subsequent fall with wrist fracture OSA Renal insufficiency GERD Gout Hiatal hernia  Significant Hospital Events: Including procedures, antibiotic start and stop dates in addition to other pertinent events   3/30 admit with SOB, chest pain 4/1 LHC with no angiographically evident coronary artery disease to suggest cause of chest pain.  Noted elevated RVSP on echo.  CTA completed & positive for saddle PE. IR/PCCM consulted.  4/2 hemodynamically stable, though had an episode of CP this morning  Interim History / Subjective:  On 2L [place] this morning without hypotension or tachycardia  Episode of CP that resolved in a few minutes  Objective   Blood pressure 129/77, pulse 78, temperature 97.9 F (36.6 C), temperature source Oral, resp. rate 20, height 5' 9.5" (1.765 m), weight 92.4 kg, SpO2 94 %.        Intake/Output Summary (Last 24 hours) at 10/14/2022 0753 Last data filed at 10/14/2022 0700 Gross per 24 hour  Intake 657.51 ml  Output 1275 ml  Net -617.49 ml    Filed Weights   10/11/22 1821 10/14/22 0300  Weight: 93 kg 92.4 kg   General:  well nourished M, resting in bed in no acute distress HEENT: MM pink/moist, sclera anicteric Neuro: awake, alert and oriented  CV: s1s2 rrr, no m/r/g PULM:  clear bilaterally on 2L without tachypnea or accessory muscle use GI: soft, non-tender Extremities: warm/dry, no edema, no leg TTP Skin: no rashes or  lesions     Resolved Hospital Problem list      Assessment & Plan:   Acute Submassive Pulmonary Embolism  Chest Pain / Dyspnea secondary to Above  Hx Prior PE (2008, provoked after surgery) Submassive PE/saddle on imaging, has fortunately been on heparin infusion since admission for possible ACS. Remains intermittently symptomatic with chest pressure, lightheadedness and shortness of breath. Prior RVSP ~28, currently 69.  RV/LV ratio 1.18.  T wave inversion in lead III.   -hemodynamically stable this morning though had a  repeated episode of CP -continue heparin infusion per Pharmacy -appreciate IR consultation for consideration of catheter directed thrombolytics, plan to proceed with this today -O2 to support sats >92% -repeat troponin, lactic and BNP now given period of lightheadedness / ongoing chest pain  -if acutely worsening consider systemic TNK, discussed with family, no contra-indications -transfer to ICU for observation    Chronic Thrombocytopenia  -stable, 84, up from 19 yesterday  CAD  Mixed Hyperlipidemia  S/p LHC with no significant disease burden  -statin  -continue avapro, toprol XL (note beta blocker may be masking some of tachycardia from PE)  GERD  -PPI   Best Practice (right click and "Reselect all SmartList Selections" daily)  Diet/type: NPO DVT prophylaxis: systemic heparin GI prophylaxis: PPI Lines: N/A Foley:  N/A Code Status:  full code Last date of multidisciplinary goals of care discussion: full code, family updated at the bedside 4/2  Labs   CBC: Recent Labs  Lab 10/11/22 1823 10/13/22 0113 10/14/22 0104  WBC 7.4 7.5 7.3  HGB 14.0 13.7 13.8  HCT 41.7 42.4 43.1  MCV 91.6 92.4 93.3  PLT 89* 80* 84*     Basic Metabolic Panel: Recent Labs  Lab 10/11/22 1823 10/14/22 0104  NA 139 137  K 4.4 4.5  CL 105 104  CO2 25 24  GLUCOSE 88 124*  BUN 26* 17  CREATININE 1.21 1.04  CALCIUM 8.9 9.1    GFR: Estimated Creatinine Clearance: 65.3 mL/min (by C-G formula based on SCr of 1.04 mg/dL). Recent Labs  Lab 10/11/22 1823 10/13/22 0113 10/14/22 0104  WBC 7.4 7.5 7.3     Liver Function Tests: Recent Labs  Lab 10/11/22 1823  AST 27  ALT 24  ALKPHOS 64  BILITOT 0.9  PROT 6.4*  ALBUMIN 3.4*    No results for input(s): "LIPASE", "AMYLASE" in the last 168 hours. No results for input(s): "AMMONIA" in the last 168 hours.  ABG    Component Value Date/Time   TCO2 19 01/04/2008 0020     Coagulation Profile: No results for input(s): "INR",  "PROTIME" in the last 168 hours.  Cardiac Enzymes: No results for input(s): "CKTOTAL", "CKMB", "CKMBINDEX", "TROPONINI" in the last 168 hours.  HbA1C: No results found for: "HGBA1C"  CBG: No results for input(s): "GLUCAP" in the last 168 hours.  Review of Systems: Positives in [place]  Gen: Denies fever, chills, weight change, fatigue, night sweats HEENT: Denies blurred vision, double vision, hearing loss, tinnitus, sinus congestion, rhinorrhea, sore throat, neck stiffness, dysphagia PULM: Denies bilateral chest pain, shortness of breath, cough, sputum production, hemoptysis, wheezing CV: Denies chest pain, edema, orthopnea, paroxysmal nocturnal dyspnea, palpitations GI: Denies abdominal pain, nausea, vomiting, diarrhea, hematochezia, melena, constipation, change in bowel habits GU: Denies dysuria, hematuria, polyuria, oliguria, urethral discharge Endocrine: Denies hot or cold intolerance, polyuria, polyphagia or appetite change Derm: Denies rash, dry skin, scaling or peeling skin change Heme: Denies easy bruising, bleeding, bleeding gums Neuro: Denies headache, numbness, weakness,  slurred speech, loss of memory or consciousness  Past Medical History:  He,  has a past medical history of Coronary artery disease, GERD (gastroesophageal reflux disease), Gout, History of hiatal hernia, Hypertension, Pulmonary embolism (2008 after knee surgey), Renal insufficiency, and Sleep apnea.   Surgical History:   Past Surgical History:  Procedure Laterality Date   arthroscopic knee surgery  2004   BACK SURGERY  nov 1979 and 1998   lower   COLONOSCOPY WITH PROPOFOL N/A 10/20/2016   Procedure: COLONOSCOPY WITH PROPOFOL;  Surgeon: Garlan Fair, MD;  Location: [facility];  Service: Endoscopy;  Laterality: N/A;   JOINT REPLACEMENT Left 2007   knee    LEFT HEART CATH AND CORONARY ANGIOGRAPHY N/A 06/30/2017   Procedure: LEFT HEART CATH AND CORONARY ANGIOGRAPHY;  Surgeon: Nigel Mormon, MD;   Location: [place] [facility];  Service: Cardiovascular;  Laterality: N/A;   LEFT HEART CATH AND CORONARY ANGIOGRAPHY N/A 10/13/2022   Procedure: LEFT HEART CATH AND CORONARY ANGIOGRAPHY;  Surgeon: Nigel Mormon, MD;  Location: [facility];  Service: Cardiovascular;  Laterality: N/A;   left wrist plating   2005   titanium plate   right total knee replacement Right 2009     Social History:   reports that he has never smoked. He has never used smokeless tobacco. He reports that he does not drink alcohol and does not use drugs.   Family History:  His family history includes Heart attack in his father; Heart disease in his father and mother; Hypertension in his father and mother.   Allergies No Known Allergies   Home Medications  Prior to Admission medications   Medication Sig Start Date End Date Taking? Authorizing Provider  allopurinol (ZYLOPRIM) 300 MG tablet Take 150 mg by mouth daily.    Yes [provider]  ascorbic acid (VITAMIN C) 500 MG tablet Take 500 mg by mouth daily.   Yes [provider]  Cholecalciferol (VITAMIN D3) 5000 units CAPS Take 2,000 Units by mouth daily with supper.    Yes [provider]  finasteride (PROSCAR) 5 MG tablet Take 5 mg by mouth at bedtime.    Yes [provider]  fluticasone (FLONASE) 50 MCG/ACT nasal spray Place 1 spray into both nostrils every evening.   Yes [provider]  magnesium gluconate (MAGONATE) 500 MG tablet Take 500 mg by mouth daily.   Yes [provider]  nitroGLYCERIN (NITROSTAT) 0.4 MG SL tablet Place 0.4 mg under the tongue every 5 (five) minutes as needed for chest pain.   Yes [provider]  Omega-3 Fatty Acids (FISH OIL) 1000 MG CAPS Take 1,000 mg by mouth daily.   Yes [provider]  omeprazole (PRILOSEC) 40 MG capsule Take 1 capsule (40 mg total) by mouth daily. 04/21/22  Yes Patwardhan, Manish J, MD  rosuvastatin (CRESTOR) 20 MG tablet Take 1  tablet (20 mg total) by mouth daily. 06/30/22 06/25/23 Yes Patwardhan, Manish J, MD  telmisartan (MICARDIS) 80 MG tablet Take 40 mg by mouth daily.   Yes [provider]  zinc gluconate 50 MG tablet Take 50 mg by mouth daily.   Yes [provider]     Critical care time: 40 minutes    CRITICAL CARE Performed by: Otilio Carpen Britain Anagnos   Total critical care time: 40 minutes  Critical care time was exclusive of separately billable procedures and treating other patients.  Critical care was necessary to treat or prevent imminent or life-threatening  deterioration.  Critical care was time spent personally by me on the following activities: development of treatment plan with patient and/or surrogate as well as nursing, discussions with consultants, evaluation of patient's response to treatment, examination of patient, obtaining history from patient or surrogate, ordering and performing treatments and interventions, ordering and review of laboratory studies, ordering and review of radiographic studies, pulse oximetry and re-evaluation of patient's condition.   Otilio Carpen Nina Mondor, PA-C [facility] See Amion for pager If no response to pager , please call [phone] until 7pm After 7:00 pm call Elink  [phone]?[place]

## 2022-10-14 NOTE — Progress Notes (Signed)
Bilateral lower extremity venous duplex has been completed. Preliminary results can be found in CV Proc through chart review.  Results were given to the patient's nurse, Ovid Curd.  10/14/22 1:32 PM Ralph Blackwell RVT

## 2022-10-14 NOTE — Progress Notes (Addendum)
ANTICOAGULATION CONSULT NOTE  Pharmacy Consult for Heparin Indication: chest pain/ACS  No Known Allergies  Patient Measurements: Height: 5' 9.5" (176.5 cm) Weight: 92.4 kg (203 lb 11.3 oz) IBW/kg (Calculated) : 71.85 Heparin Dosing Weight: 90.8 kg  Vital Signs: Temp: 98.1 F (36.7 C) (04/02 0756) Temp Source: Oral (04/02 0756) BP: 125/73 (04/02 1010) Pulse Rate: 86 (04/02 1010)  Labs: Recent Labs    10/11/22 1823 10/11/22 2119 10/12/22 0601 10/12/22 1522 10/13/22 0113 10/13/22 1748 10/13/22 1935 10/14/22 0104  HGB 14.0  --   --   --  13.7  --   --  13.8  HCT 41.7  --   --   --  42.4  --   --  43.1  PLT 89*  --   --   --  80*  --   --  84*  HEPARINUNFRC  --   --    < > 0.49 0.44  --   --  0.45  CREATININE 1.21  --   --   --   --   --   --  1.04  TROPONINIHS 22* 24*  --   --   --  33* 36*  --    < > = values in this interval not displayed.     Estimated Creatinine Clearance: 65.3 mL/min (by C-G formula based on SCr of 1.04 mg/dL).   Medical History: Past Medical History:  Diagnosis Date   Coronary artery disease    GERD (gastroesophageal reflux disease)    Gout    History of hiatal hernia    Hypertension    Pulmonary embolism 2008 after knee surgey   Renal insufficiency    hx renal failure 2008 after blood clots   Sleep apnea     Medications:  Medications Prior to Admission  Medication Sig Dispense Refill Last Dose   allopurinol (ZYLOPRIM) 300 MG tablet Take 150 mg by mouth daily.    10/11/2022   ascorbic acid (VITAMIN C) 500 MG tablet Take 500 mg by mouth daily.   10/11/2022   Cholecalciferol (VITAMIN D3) 5000 units CAPS Take 2,000 Units by mouth daily with supper.    10/11/2022   finasteride (PROSCAR) 5 MG tablet Take 5 mg by mouth at bedtime.    10/11/2022   fluticasone (FLONASE) 50 MCG/ACT nasal spray Place 1 spray into both nostrils every evening.   unk   magnesium gluconate (MAGONATE) 500 MG tablet Take 500 mg by mouth daily.   10/11/2022    nitroGLYCERIN (NITROSTAT) 0.4 MG SL tablet Place 0.4 mg under the tongue every 5 (five) minutes as needed for chest pain.   unk   Omega-3 Fatty Acids (FISH OIL) 1000 MG CAPS Take 1,000 mg by mouth daily.   10/11/2022   omeprazole (PRILOSEC) 40 MG capsule Take 1 capsule (40 mg total) by mouth daily. 90 capsule 3 10/11/2022   rosuvastatin (CRESTOR) 20 MG tablet Take 1 tablet (20 mg total) by mouth daily. 90 tablet 3 10/11/2022   telmisartan (MICARDIS) 80 MG tablet Take 40 mg by mouth daily.   10/11/2022   zinc gluconate 50 MG tablet Take 50 mg by mouth daily.   10/11/2022    Scheduled:   allopurinol  150 mg Oral Daily   aspirin EC  81 mg Oral Daily   Chlorhexidine Gluconate Cloth  6 each Topical Daily   finasteride  5 mg Oral QHS   irbesartan  150 mg Oral Daily   metoprolol succinate  25 mg Oral Daily  pantoprazole  80 mg Oral Daily   rosuvastatin  40 mg Oral Daily   sodium chloride flush  3 mL Intravenous Q12H   Infusions:   sodium chloride     heparin 1,200 Units/hr (10/14/22 0900)   nitroGLYCERIN Stopped (10/13/22 0550)   PRN:   Assessment: 26 yom with a history of HTN, HLD, mild non-obstructive CAD. Patient is presenting with chest pain and SOB. Heparin per pharmacy consult placed for chest pain/ACS.Patient is not on anticoagulation prior to arrival.  Heparin level therapeutic 0.45. IR planned.  Goal of Therapy:  Heparin level 0.3-0.7 units/ml Monitor platelets by anticoagulation protocol: Yes   Plan:  Cont heparin 1200 units/hr F/U plans post IR  ADDENDUM: Pt is s/p IR with bilateral PE catheter directed lysis infusing. Heparin continues, will continue rate above with q6h checks of heparin level, CBC, and fibrinogen.   Arrie Senate, PharmD, BCPS, [facility] Clinical Pharmacist [phone] Please check AMION for all [place] numbers 10/14/2022

## 2022-10-14 NOTE — Progress Notes (Signed)
ANTICOAGULATION CONSULT NOTE  Pharmacy Consult for Heparin Indication: chest pain/ACS  No Known Allergies  Patient Measurements: Height: 5' 9.5" (176.5 cm) Weight: 92.4 kg (203 lb 11.3 oz) IBW/kg (Calculated) : 71.85 Heparin Dosing Weight: 90.8 kg  Vital Signs: Temp: 98.1 F (36.7 C) (04/02 0756) Temp Source: Oral (04/02 0756) BP: 117/81 (04/02 1239) Pulse Rate: 88 (04/02 1239)  Labs: Recent Labs    10/11/22 1823 10/11/22 2119 10/13/22 0113 10/13/22 1748 10/13/22 1935 10/14/22 0104 10/14/22 1252  HGB 14.0  --  13.7  --   --  13.8 14.4  HCT 41.7  --  42.4  --   --  43.1 44.4  PLT 89*  --  80*  --   --  84* 80*  HEPARINUNFRC  --    < > 0.44  --   --  0.45 0.53  CREATININE 1.21  --   --   --   --  1.04  --   TROPONINIHS 22*   < >  --  33* 36*  --  139*   < > = values in this interval not displayed.     Estimated Creatinine Clearance: 65.3 mL/min (by C-G formula based on SCr of 1.04 mg/dL).   Medical History: Past Medical History:  Diagnosis Date   Coronary artery disease    GERD (gastroesophageal reflux disease)    Gout    History of hiatal hernia    Hypertension    Pulmonary embolism 2008 after knee surgey   Renal insufficiency    hx renal failure 2008 after blood clots   Sleep apnea     Medications:  Medications Prior to Admission  Medication Sig Dispense Refill Last Dose   allopurinol (ZYLOPRIM) 300 MG tablet Take 150 mg by mouth daily.    10/11/2022   ascorbic acid (VITAMIN C) 500 MG tablet Take 500 mg by mouth daily.   10/11/2022   Cholecalciferol (VITAMIN D3) 5000 units CAPS Take 2,000 Units by mouth daily with supper.    10/11/2022   finasteride (PROSCAR) 5 MG tablet Take 5 mg by mouth at bedtime.    10/11/2022   fluticasone (FLONASE) 50 MCG/ACT nasal spray Place 1 spray into both nostrils every evening.   unk   magnesium gluconate (MAGONATE) 500 MG tablet Take 500 mg by mouth daily.   10/11/2022   nitroGLYCERIN (NITROSTAT) 0.4 MG SL tablet Place 0.4  mg under the tongue every 5 (five) minutes as needed for chest pain.   unk   Omega-3 Fatty Acids (FISH OIL) 1000 MG CAPS Take 1,000 mg by mouth daily.   10/11/2022   omeprazole (PRILOSEC) 40 MG capsule Take 1 capsule (40 mg total) by mouth daily. 90 capsule 3 10/11/2022   rosuvastatin (CRESTOR) 20 MG tablet Take 1 tablet (20 mg total) by mouth daily. 90 tablet 3 10/11/2022   telmisartan (MICARDIS) 80 MG tablet Take 40 mg by mouth daily.   10/11/2022   zinc gluconate 50 MG tablet Take 50 mg by mouth daily.   10/11/2022    Scheduled:   allopurinol  150 mg Oral Daily   aspirin EC  81 mg Oral Daily   Chlorhexidine Gluconate Cloth  6 each Topical Daily   finasteride  5 mg Oral QHS   irbesartan  150 mg Oral Daily   lidocaine       metoprolol succinate  25 mg Oral Daily   pantoprazole  80 mg Oral Daily   rosuvastatin  40 mg Oral Daily  sodium chloride flush  3 mL Intravenous Q12H   sodium chloride flush  3 mL Intravenous Q12H   Infusions:   sodium chloride     sodium chloride     [START ON 10/15/2022] sodium chloride     [START ON 10/15/2022] sodium chloride     alteplase (tPA/ACTIVASE) 12 mg in sodium chloride 0.9 % 238 mL     alteplase (tPA/ACTIVASE) 12 mg in sodium chloride 0.9 % 238 mL     heparin 1,200 Units/hr (10/14/22 0900)   nitroGLYCERIN Stopped (10/13/22 0550)   PRN:   Assessment: 82 yoM admitted with CP with clean cath. CT chest showed PE. Pt started on IV heparin. IR placed bilateral PE lysis catheters 4/2, heparin continued.  Heparin level therapeutic at 0.53, CBC stable, fibrinogen elevated.  Goal of Therapy:  Heparin level 0.3-0.7 units/ml Monitor platelets by anticoagulation protocol: Yes   Plan:  Cont heparin 1200 units/hr Check q6h heparin level, CBC, fibrinogen    Arrie Senate, PharmD, BCPS, [facility] Clinical Pharmacist [phone] Please check AMION for all [facility] numbers 10/14/2022

## 2022-10-14 NOTE — Progress Notes (Signed)
Patient had episode of 6/10 CP, has been 2-3/10 since admission consistently. Had an episode of 8/10 CP last night. Does not feel more SOB, denies dizziness/ lightheadedness, nausea, diaphoresis, or other new symptoms. He was evaluated by IR last night and had been offered thrombectomy. We reviewed the options from heparin to IR procedures to IV thrombolytics and discussed risks and benefits of all. They wish to proceed with cathter- directed therapy. His thrombocytopenia is a relative c/i to thrombolytics but if he developed severe shock I think it would be indicated. IR planning for thrombectomy this morning.   BP 125/73   Pulse 86   Temp 98.1 F (36.7 C) (Oral)   Resp 20   Ht 5' 9.5" (1.765 m)   Wt 92.4 kg   SpO2 96%   BMI 29.65 kg/m  Elderly man sitting up in bed in NAD [place]/AT, eyes anicteric Breathing comfortably on [place], no tachypnea or conversational dyspnea Abd soft, NT Speaking in full sentences, answering questions appropriately, moving all extremities Skin warm, dry  Julian Hy, DO 10/14/22 10:27 AM [facility]  For contact information, see Amion. If no response to pager, please call PCCM consult pager. After hours, 7PM- 7AM, please call Elink.

## 2022-10-14 NOTE — Sedation Documentation (Addendum)
During procedure PA pressure measured 42/15 (25)  Venous sheaths (6 fr)

## 2022-10-14 NOTE — Consult Note (Signed)
Chief Complaint: Patient was seen in consultation today for Bilateral pulmonary embolus thrombolysis Chief Complaint  Patient presents with   Chest Pain   at the request of Dr Jeanella Craze   Supervising Physician: Daryll Brod  Patient Status: [facility] - In-pt  History of Present Illness: Ralph Blackwell is a 82 y.o. male   Pt has had some SOB x 3 weeks Worsened just this last weekend Felt like a band around his chest To ED Cardiac work up clear  Feeling some better on Heparin   CTA yesterday:  IMPRESSION: 1. Positive for pulmonary emboli. Right greater than left pulmonary emboli with a saddle pulmonary embolus as detailed. Positive for acute PE with CT evidence of right heart strain (RV/LV Ratio = 1.18) consistent with at least submassive (intermediate risk) PE. The presence of right heart strain has been associated with an increased risk of morbidity and mortality. Please refer to the "Code PE Focused" order set in EPIC.  PCCM consulted and asking for Pulmonary embolus thrombolysis Dr Serafina Royals and Dr Annamaria Boots have reviewed imaging Approve thrombolysis Scheduled today   Past Medical History:  Diagnosis Date   Coronary artery disease    GERD (gastroesophageal reflux disease)    Gout    History of hiatal hernia    Hypertension    Pulmonary embolism 2008 after knee surgey   Renal insufficiency    hx renal failure 2008 after blood clots   Sleep apnea     Past Surgical History:  Procedure Laterality Date   arthroscopic knee surgery  2004   BACK SURGERY  nov 1979 and 1998   lower   COLONOSCOPY WITH PROPOFOL N/A 10/20/2016   Procedure: COLONOSCOPY WITH PROPOFOL;  Surgeon: Garlan Fair, MD;  Location: [facility];  Service: Endoscopy;  Laterality: N/A;   JOINT REPLACEMENT Left 2007   knee    LEFT HEART CATH AND CORONARY ANGIOGRAPHY N/A 06/30/2017   Procedure: LEFT HEART CATH AND CORONARY ANGIOGRAPHY;  Surgeon: Nigel Mormon, MD;  Location: [place] [facility];   Service: Cardiovascular;  Laterality: N/A;   LEFT HEART CATH AND CORONARY ANGIOGRAPHY N/A 10/13/2022   Procedure: LEFT HEART CATH AND CORONARY ANGIOGRAPHY;  Surgeon: Nigel Mormon, MD;  Location: [place] [facility];  Service: Cardiovascular;  Laterality: N/A;   left wrist plating   2005   titanium plate   right total knee replacement Right 2009    Allergies: Patient has no known allergies.  Medications: Prior to Admission medications   Medication Sig Start Date End Date Taking? Authorizing Provider  allopurinol (ZYLOPRIM) 300 MG tablet Take 150 mg by mouth daily.    Yes [provider]  ascorbic acid (VITAMIN C) 500 MG tablet Take 500 mg by mouth daily.   Yes [provider]  Cholecalciferol (VITAMIN D3) 5000 units CAPS Take 2,000 Units by mouth daily with supper.    Yes [provider]  finasteride (PROSCAR) 5 MG tablet Take 5 mg by mouth at bedtime.    Yes [provider]  fluticasone (FLONASE) 50 MCG/ACT nasal spray Place 1 spray into both nostrils every evening.   Yes [provider]  magnesium gluconate (MAGONATE) 500 MG tablet Take 500 mg by mouth daily.   Yes [provider]  nitroGLYCERIN (NITROSTAT) 0.4 MG SL tablet Place 0.4 mg under the tongue every 5 (five) minutes as needed for chest pain.   Yes [provider]  Omega-3 Fatty Acids (FISH OIL) 1000 MG CAPS Take 1,000  mg by mouth daily.   Yes [provider]  omeprazole (PRILOSEC) 40 MG capsule Take 1 capsule (40 mg total) by mouth daily. 04/21/22  Yes Patwardhan, Manish J, MD  rosuvastatin (CRESTOR) 20 MG tablet Take 1 tablet (20 mg total) by mouth daily. 06/30/22 06/25/23 Yes Patwardhan, Manish J, MD  telmisartan (MICARDIS) 80 MG tablet Take 40 mg by mouth daily.   Yes [provider]  zinc gluconate 50 MG tablet Take 50 mg by mouth daily.   Yes [provider]     Family History  Problem Relation Age of Onset   Heart disease  Mother    Hypertension Mother    Heart disease Father    Heart attack Father    Hypertension Father     Social History   Socioeconomic History   Marital status: Married    Spouse name: Not on file   Number of children: 2   Years of education: Not on file   Highest education level: Not on file  Occupational History   Not on file  Tobacco Use   Smoking status: Never   Smokeless tobacco: Never  Vaping Use   Vaping Use: Never used  Substance and Sexual Activity   Alcohol use: No   Drug use: No   Sexual activity: Not on file  Other Topics Concern   Not on file  Social History Narrative   Not on file   Social Determinants of Health   Financial Resource Strain: Not on file  Food Insecurity: No Food Insecurity (10/13/2022)   Hunger Vital Sign    Worried About Running Out of Food in the Last Year: Never true    Ran Out of Food in the Last Year: Never true  Transportation Needs: No Transportation Needs (10/13/2022)   PRAPARE - Hydrologist (Medical): No    Lack of Transportation (Non-Medical): No  Physical Activity: Not on file  Stress: Not on file  Social Connections: Not on file    Review of Systems: A 12 point ROS discussed and pertinent positives are indicated in the HPI above.  All other systems are negative.  Review of Systems  Constitutional:  Positive for activity change. Negative for diaphoresis.  Respiratory:  Positive for shortness of breath. Negative for wheezing.   Cardiovascular:  Negative for chest pain.  Gastrointestinal:  Negative for abdominal pain.  Psychiatric/Behavioral:  Negative for behavioral problems and confusion.     Vital Signs: BP 125/73   Pulse 86   Temp 98.1 F (36.7 C) (Oral)   Resp 20   Ht 5' 9.5" (1.765 m)   Wt 203 lb 11.3 oz (92.4 kg)   SpO2 96%   BMI 29.65 kg/m    Physical Exam Vitals reviewed.  Cardiovascular:     Rate and Rhythm: Normal rate and regular rhythm.  Pulmonary:     Breath sounds:  Normal breath sounds. No wheezing or rhonchi.  Abdominal:     General: Bowel sounds are normal.     Palpations: Abdomen is soft.  Musculoskeletal:        General: Normal range of motion.  Skin:    General: Skin is warm.  Neurological:     Mental Status: He is alert and oriented to person, place, and time.  Psychiatric:        Behavior: Behavior normal.     Imaging: CT Angio Chest Pulmonary Embolism (PE) W or WO Contrast  Addendum Date: 10/13/2022   ADDENDUM REPORT:  10/13/2022 15:59 ADDENDUM: Critical Value/emergent results were called by telephone at the time of interpretation on 10/13/2022 at 3:59 pm to provider [facility] , who verbally acknowledged these results. Electronically Signed   By: Lajean Manes M.D.   On: 10/13/2022 15:59   Result Date: 10/13/2022 CLINICAL DATA:  Unstable angina. Inpatient. Clinical suspicion for pulmonary embolism. EXAM: CT ANGIOGRAPHY CHEST WITH CONTRAST TECHNIQUE: Multidetector CT imaging of the chest was performed using the standard protocol during bolus administration of intravenous contrast. Multiplanar CT image reconstructions and MIPs were obtained to evaluate the vascular anatomy. RADIATION DOSE REDUCTION: This exam was performed according to the departmental dose-optimization program which includes automated exposure control, adjustment of the mA and/or kV according to patient size and/or use of iterative reconstruction technique. CONTRAST:  67mL OMNIPAQUE IOHEXOL 350 MG/ML SOLN COMPARISON:  01/25/2013.  Chest radiograph, 10/11/2022. FINDINGS: Cardiovascular:  Pulmonary arteries are well opacified. There are bilateral pulmonary emboli, greater on the right. A saddle embolus spans the right and left pulmonary arteries. Near occlusive emboli extend into the right lower lobe segmental pulmonary arteries. Nonocclusive emboli are noted in the right middle and upper lobe central pulmonary arteries. On the left nonocclusive pulmonary embolism extends from the saddle  embolism into the central lingular branch pulmonary artery. RV LV ratio is 1.18. Heart normal in size. No pericardial effusion. Great vessels are normal in caliber. Mild aortic atherosclerosis. No dissection. Mediastinum/Nodes: Significant enlargement of the left thyroid lobe that extends substernally. This represents a notable change from the chest CT dated 01/25/2013. No other mediastinal masses. No hilar masses. No mediastinal or hilar adenopathy. Trachea and esophagus are unremarkable. Lungs/Pleura: Minor basilar subsegmental atelectasis. Lungs otherwise clear. No pleural effusion or pneumothorax. Upper Abdomen: No acute finding. Musculoskeletal: No fracture or acute finding. No bone lesion. No chest wall mass. Review of the MIP images confirms the above findings. IMPRESSION: 1. Positive for pulmonary emboli. Right greater than left pulmonary emboli with a saddle pulmonary embolus as detailed. Positive for acute PE with CT evidence of right heart strain (RV/LV Ratio = 1.18) consistent with at least submassive (intermediate risk) PE. The presence of right heart strain has been associated with an increased risk of morbidity and mortality. Please refer to the "Code PE Focused" order set in EPIC. 2. No other acute finding. 3. Significant enlargement of the left thyroid lobe that extends substernally. Recommend thyroid ultrasound (ref: J Am Coll Radiol. 2015 Feb;12(2): 143-50). Aortic Atherosclerosis (ICD10-I70.0). Electronically Signed: By: Lajean Manes M.D. On: 10/13/2022 15:50   CARDIAC CATHETERIZATION  Result Date: 10/13/2022 Images from the original result were not included. LM: Normal LAD: Minimal luminal irregularities Lcx: Large, dominant. Normal RCA: Small, nondominant.  Normal LVEDP normal Resting Pd/Pa: 0.93 FFR: 0.88 CFR: 4.5 IMR: 27 (only marginally abnormal) No angiographically evident significant coronary artery disease Nearly normal microvascular indicis Above findings do not explain the severity  of patient's chest pain shortness of breath. I personally reviewed and compared echocardiograms.  Echocardiogram performed outpatient last week showed RVSP of 28 mmHg.  RVSP was noted to be 69 mmHg on inpatient echocardiogram yesterday.  Patient reportedly has history of PEs in the past. Given significant only different RVSP, patient's profound symptoms not explained by coronary or cardiac etiology, I strongly suspect acute pulmonary cause such as pulmonary embolism.  I personally discussed this with patient and patient's wife, as well as hospitalist Dr. Parks Ranger.  We will proceed with CT angiogram chest, with additional hydration.  If CT angiogram does not reveal  acute PE, I will perform right heart catheterization tomorrow. Nigel Mormon, MD Pager: [phone] Office: [phone]  ECHOCARDIOGRAM COMPLETE  Result Date: 10/12/2022    ECHOCARDIOGRAM REPORT   Patient Name:   SHRIHAN DICOLA Date of Exam: 10/12/2022 Medical Rec #:  [id number]       Height:       69.5 in Accession #:    [id number]      Weight:       205.0 lb Date of Birth:  March 27, 1941       BSA:          2.098 m Patient Age:    61 years        BP:           97/61 mmHg Patient Gender: M               HR:           90 bpm. Exam Location:  Inpatient Procedure: 2D Echo, Cardiac Doppler and Color Doppler Indications:    Chest pain R07.9  History:        Patient has prior history of Echocardiogram examinations, most                 recent 10/09/2022. CAD; Risk Factors:Hypertension, Dyslipidemia                 and Non-Smoker.  Sonographer:    Wilkie Aye RVT RCS Referring Phys: [id number] [place]  1. Left ventricular ejection fraction, by estimation, is 65 to 70%. The left ventricle has normal function. The left ventricle has no regional wall motion abnormalities. Left ventricular diastolic parameters are consistent with Grade I diastolic dysfunction (impaired relaxation).  2. Right ventricular systolic function is normal. The right  ventricular size is normal. There is severely elevated pulmonary artery systolic pressure. The estimated right ventricular systolic pressure is 123456 mmHg.  3. The mitral valve is grossly normal. Mild mitral valve regurgitation. No evidence of mitral stenosis.  4. The tricuspid valve is abnormal. Tricuspid valve regurgitation is moderate.  5. The aortic valve is tricuspid. There is mild calcification of the aortic valve. Aortic valve regurgitation is mild. No aortic stenosis is present.  6. There is Moderate (Grade III) plaque involving the descending aorta.  7. The inferior vena cava is normal in size with <50% respiratory variability, suggesting right atrial pressure of 8 mmHg. Comparison(s): No prior Echocardiogram. FINDINGS  Left Ventricle: Left ventricular ejection fraction, by estimation, is 65 to 70%. The left ventricle has normal function. The left ventricle has no regional wall motion abnormalities. The left ventricular internal cavity size was normal in size. There is  no left ventricular hypertrophy. Left ventricular diastolic parameters are consistent with Grade I diastolic dysfunction (impaired relaxation). Right Ventricle: The right ventricular size is normal. No increase in right ventricular wall thickness. Right ventricular systolic function is normal. There is severely elevated pulmonary artery systolic pressure. The tricuspid regurgitant velocity is 3.91 m/s, and with an assumed right atrial pressure of 8 mmHg, the estimated right ventricular systolic pressure is 123456 mmHg. Left Atrium: Left atrial size was normal in size. Right Atrium: Right atrial size was normal in size. Pericardium: There is no evidence of pericardial effusion. Mitral Valve: The mitral valve is grossly normal. Mild mitral valve regurgitation. No evidence of mitral valve stenosis. Tricuspid Valve: The tricuspid valve is abnormal. Tricuspid valve regurgitation is moderate . No evidence of tricuspid stenosis. Aortic Valve: The  aortic valve is tricuspid.  There is mild calcification of the aortic valve. Aortic valve regurgitation is mild. No aortic stenosis is present. Aortic valve mean gradient measures 3.0 mmHg. Aortic valve peak gradient measures 5.9 mmHg. Aortic valve area, by VTI measures 2.70 cm. Pulmonic Valve: The pulmonic valve was not well visualized. Pulmonic valve regurgitation is mild. No evidence of pulmonic stenosis. Aorta: The aortic root and ascending aorta are structurally normal, with no evidence of dilitation. There is moderate (Grade III) plaque involving the descending aorta. Venous: The inferior vena cava is normal in size with less than 50% respiratory variability, suggesting right atrial pressure of 8 mmHg. IAS/Shunts: No atrial level shunt detected by color flow Doppler.  LEFT VENTRICLE PLAX 2D LVIDd:         4.40 cm   Diastology LVIDs:         2.60 cm   LV e' medial:    7.88 cm/s LV PW:         0.90 cm   LV E/e' medial:  5.7 LV IVS:        0.90 cm   LV e' lateral:   8.15 cm/s LVOT diam:     2.10 cm   LV E/e' lateral: 5.5 LV SV:         53 LV SV Index:   25 LVOT Area:     3.46 cm  RIGHT VENTRICLE             IVC RV Basal diam:  3.30 cm     IVC diam: 1.60 cm RV Mid diam:    3.20 cm RV S prime:     16.20 cm/s TAPSE (M-mode): 1.5 cm LEFT ATRIUM             Index        RIGHT ATRIUM           Index LA diam:        3.80 cm 1.81 cm/m   RA Area:     16.00 cm LA Vol (A2C):   60.2 ml 28.69 ml/m  RA Volume:   47.40 ml  22.59 ml/m LA Vol (A4C):   53.6 ml 25.55 ml/m LA Biplane Vol: 58.6 ml 27.93 ml/m  AORTIC VALVE                    PULMONIC VALVE AV Area (Vmax):    2.61 cm     PV Vmax:       0.74 m/s AV Area (Vmean):   2.49 cm     PV Peak grad:  2.2 mmHg AV Area (VTI):     2.70 cm AV Vmax:           121.50 cm/s AV Vmean:          80.600 cm/s AV VTI:            0.198 m AV Peak Grad:      5.9 mmHg AV Mean Grad:      3.0 mmHg LVOT Vmax:         91.53 cm/s LVOT Vmean:        57.867 cm/s LVOT VTI:          0.154 m  LVOT/AV VTI ratio: 0.78 AR Vena Contracta: 0.40 cm  AORTA Ao Root diam: 3.40 cm Ao Asc diam:  3.30 cm Ao Arch diam: 3.3 cm MITRAL VALVE               TRICUSPID VALVE MV Area (PHT): 2.77 cm  TR Peak grad:   61.2 mmHg MV Decel Time: 274 msec    TR Vmax:        391.00 cm/s MV E velocity: 45.10 cm/s MV A velocity: 57.10 cm/s  SHUNTS MV E/A ratio:  0.79        Systemic VTI:  0.15 m                            Systemic Diam: 2.10 cm Vishnu Priya Mallipeddi Electronically signed by Lorelee Cover Mallipeddi Signature Date/Time: 10/12/2022/3:05:20 PM    Final    DG Chest 2 View  Result Date: 10/11/2022 CLINICAL DATA:  Chest EXAM: CHEST - 2 VIEW COMPARISON:  CXR 01/25/13 FINDINGS: No pleural effusion. No pneumothorax. No focal airspace opacity. Normal cardiac and mediastinal contours. No radiographically apparent displaced rib fractures. Visualized upper abdomen is unremarkable. Degenerative changes of the left AC joint and right glenohumeral joint. IMPRESSION: No focal airspace opacity. Electronically Signed   By: Marin Roberts M.D.   On: 10/11/2022 18:53   PCV ECHOCARDIOGRAM COMPLETE  Result Date: 10/09/2022 Echocardiogram 10/08/2022: Normal LV systolic function with visual EF 60-65%. Left ventricle cavity is normal in size. Normal left ventricular wall thickness. Normal global wall motion. Normal diastolic filling pattern, normal LAP. Calculated EF 70%. Left atrial cavity is slightly dilated. Trileaflet aortic valve.  Moderate (Grade III) aortic regurgitation. Structurally normal mitral valve.  Mild (Grade I) mitral regurgitation. Structurally normal tricuspid valve.  Mild tricuspid regurgitation. Structurally normal pulmonic valve.  Mild pulmonic regurgitation. No significant change compared to 03/2022.    Labs:  CBC: Recent Labs    10/11/22 1823 10/13/22 0113 10/14/22 0104  WBC 7.4 7.5 7.3  HGB 14.0 13.7 13.8  HCT 41.7 42.4 43.1  PLT 89* 80* 84*    COAGS: No results for input(s): "INR", "APTT" in  the last 8760 hours.  BMP: Recent Labs    10/11/22 1823 10/14/22 0104  NA 139 137  K 4.4 4.5  CL 105 104  CO2 25 24  GLUCOSE 88 124*  BUN 26* 17  CALCIUM 8.9 9.1  CREATININE 1.21 1.04  GFRNONAA >60 >60    LIVER FUNCTION TESTS: Recent Labs    10/11/22 1823  BILITOT 0.9  AST 27  ALT 24  ALKPHOS 64  PROT 6.4*  ALBUMIN 3.4*    TUMOR MARKERS: No results for input(s): "AFPTM", "CEA", "CA199", "CHROMGRNA" in the last 8760 hours.  Assessment and Plan:  Bilateral Pulmonary embolus Scheduled for IR Pulmonary embolus thrombolysis  Risks and benefits of PE thrombectomy discussed with the patient including, but not limited to bleeding, pulmonary hemorrhage, perforation or dissection of cardiovascular structures, vascular injury, stroke, contrast induced renal failure, and infection.  Pt is aware of procedure and agreeable to proceed Consent signed and in chart   Thank you for this interesting consult.  I greatly enjoyed meeting Ralph Blackwell and look forward to participating in their care.  A copy of this report was sent to the requesting provider on this date.  Electronically Signed: Lavonia Drafts, PA-C 10/14/2022, 10:38 AM   I spent a total of 40 Minutes    in face to face in clinical consultation, greater than 50% of which was counseling/coordinating care for Bilat PE Thrombolysis

## 2022-10-14 NOTE — Progress Notes (Signed)
ANTICOAGULATION CONSULT NOTE  Pharmacy Consult for Heparin Indication: chest pain/ACS  No Known Allergies  Patient Measurements: Height: 5' 9.5" (176.5 cm) Weight: 92.4 kg (203 lb 11.3 oz) IBW/kg (Calculated) : 71.85 Heparin Dosing Weight: 90.8 kg  Vital Signs: Temp: 98.1 F (36.7 C) (04/02 0756) Temp Source: Oral (04/02 0756) BP: 130/60 (04/02 1930) Pulse Rate: 77 (04/02 1930)  Labs: Recent Labs    10/13/22 1748 10/13/22 1935 10/14/22 0104 10/14/22 1252 10/14/22 1832  HGB  --   --  13.8 14.4 14.2  HCT  --   --  43.1 44.4 42.3  PLT  --   --  84* 80* 67*  HEPARINUNFRC  --   --  0.45 0.53 0.36  CREATININE  --   --  1.04  --   --   TROPONINIHS 33* 36*  --  139*  --      Estimated Creatinine Clearance: 65.3 mL/min (by C-G formula based on SCr of 1.04 mg/dL).   Assessment: 26 yoM admitted with CP with clean cath. CT chest showed PE. Pt started on IV heparin. IR placed bilateral PE lysis catheters 4/2, heparin continued.  Heparin level remains therapeutic (0.36) on infusion at 1200 units/hr. Fibrinogen elevated, Hgb 14.2, plt down slightly to 67 (80s this admission).  Goal of Therapy:  Heparin level 0.3-0.7 units/ml Monitor platelets by anticoagulation protocol: Yes   Plan:  Continue heparin 1200 units/hr Check q6h heparin level, CBC, fibrinogen  Sherlon Handing, PharmD, BCPS Please see amion for complete clinical pharmacist phone list 10/14/2022 7:52 PM

## 2022-10-14 NOTE — Procedures (Signed)
Interventional Radiology Procedure Note  Procedure: BILATERAL PE CATH LYSIS    Complications: None  Estimated Blood Loss:  MIN  Findings: FULL REPORT IN PACS     Tamera Punt, MD

## 2022-10-15 ENCOUNTER — Encounter (HOSPITAL_COMMUNITY)
Admission: EM | Disposition: A | Discharge status: Home Health | Payer: Self-pay | Source: Other Acute Inpatient Hospital | Attending: Critical Care Medicine

## 2022-10-15 ENCOUNTER — Other Ambulatory Visit: Payer: Self-pay

## 2022-10-15 ENCOUNTER — Encounter (HOSPITAL_COMMUNITY): Payer: Self-pay | Admitting: Internal Medicine

## 2022-10-15 ENCOUNTER — Inpatient Hospital Stay (HOSPITAL_COMMUNITY): Payer: PPO | Admitting: Certified Registered Nurse Anesthetist

## 2022-10-15 ENCOUNTER — Inpatient Hospital Stay (HOSPITAL_COMMUNITY): Payer: PPO

## 2022-10-15 DIAGNOSIS — M79A11 Nontraumatic compartment syndrome of right upper extremity: Secondary | ICD-10-CM

## 2022-10-15 DIAGNOSIS — T79A11A Traumatic compartment syndrome of right upper extremity, initial encounter: Secondary | ICD-10-CM

## 2022-10-15 DIAGNOSIS — D696 Thrombocytopenia, unspecified: Secondary | ICD-10-CM

## 2022-10-15 DIAGNOSIS — I97630 Postprocedural hematoma of a circulatory system organ or structure following a cardiac catheterization: Secondary | ICD-10-CM | POA: Diagnosis not present

## 2022-10-15 DIAGNOSIS — I10 Essential (primary) hypertension: Secondary | ICD-10-CM

## 2022-10-15 DIAGNOSIS — N289 Disorder of kidney and ureter, unspecified: Secondary | ICD-10-CM | POA: Diagnosis not present

## 2022-10-15 DIAGNOSIS — S5010XA Contusion of unspecified forearm, initial encounter: Secondary | ICD-10-CM

## 2022-10-15 DIAGNOSIS — I2699 Other pulmonary embolism without acute cor pulmonale: Secondary | ICD-10-CM | POA: Diagnosis not present

## 2022-10-15 DIAGNOSIS — I721 Aneurysm of artery of upper extremity: Secondary | ICD-10-CM

## 2022-10-15 DIAGNOSIS — I2602 Saddle embolus of pulmonary artery with acute cor pulmonale: Secondary | ICD-10-CM | POA: Diagnosis not present

## 2022-10-15 HISTORY — PX: IR THROMB F/U EVAL ART/VEN FINAL DAY (MS): IMG5379

## 2022-10-15 HISTORY — PX: FASCIOTOMY: SHX132

## 2022-10-15 LAB — CBC
HCT: 41.9 % (ref 39.0–52.0)
HCT: 40.5 % (ref 39.0–52.0)
HCT: 40.9 % (ref 39.0–52.0)
HCT: 34.3 % — ABNORMAL LOW (ref 39.0–52.0)
Hemoglobin: 13.9 g/dL (ref 13.0–17.0)
Hemoglobin: 13.4 g/dL (ref 13.0–17.0)
Hemoglobin: 13.5 g/dL (ref 13.0–17.0)
Hemoglobin: 11.1 g/dL — ABNORMAL LOW (ref 13.0–17.0)
MCH: 30.4 pg (ref 26.0–34.0)
MCH: 30 pg (ref 26.0–34.0)
MCH: 30.8 pg (ref 26.0–34.0)
MCH: 29.8 pg (ref 26.0–34.0)
MCHC: 33.2 g/dL (ref 30.0–36.0)
MCHC: 33.1 g/dL (ref 30.0–36.0)
MCHC: 33 g/dL (ref 30.0–36.0)
MCHC: 32.4 g/dL (ref 30.0–36.0)
MCV: 91.7 fL (ref 80.0–100.0)
MCV: 90.8 fL (ref 80.0–100.0)
MCV: 93.2 fL (ref 80.0–100.0)
MCV: 92 fL (ref 80.0–100.0)
Platelets: 55 10*3/uL — ABNORMAL LOW (ref 150–400)
Platelets: 52 10*3/uL — ABNORMAL LOW (ref 150–400)
Platelets: 56 10*3/uL — ABNORMAL LOW (ref 150–400)
Platelets: 78 10*3/uL — ABNORMAL LOW (ref 150–400)
RBC: 4.57 MIL/uL (ref 4.22–5.81)
RBC: 4.46 MIL/uL (ref 4.22–5.81)
RBC: 4.39 MIL/uL (ref 4.22–5.81)
RBC: 3.73 MIL/uL — ABNORMAL LOW (ref 4.22–5.81)
RDW: 14.4 % (ref 11.5–15.5)
RDW: 14.2 % (ref 11.5–15.5)
RDW: 14.2 % (ref 11.5–15.5)
RDW: 14.2 % (ref 11.5–15.5)
WBC: 8.2 10*3/uL (ref 4.0–10.5)
WBC: 8.3 10*3/uL (ref 4.0–10.5)
WBC: 9.6 10*3/uL (ref 4.0–10.5)
WBC: 8.7 10*3/uL (ref 4.0–10.5)
nRBC: 0 % (ref 0.0–0.2)
nRBC: 0 % (ref 0.0–0.2)
nRBC: 0 % (ref 0.0–0.2)
nRBC: 0 % (ref 0.0–0.2)

## 2022-10-15 LAB — FIBRINOGEN
Fibrinogen: 667 mg/dL — ABNORMAL HIGH (ref 210–475)
Fibrinogen: 651 mg/dL — ABNORMAL HIGH (ref 210–475)
Fibrinogen: 726 mg/dL — ABNORMAL HIGH (ref 210–475)

## 2022-10-15 LAB — LIPOPROTEIN A (LPA): Lipoprotein (a): 40.3 nmol/L — ABNORMAL HIGH (ref <75.0)

## 2022-10-15 LAB — BASIC METABOLIC PANEL
Anion gap: 10 (ref 5–15)
Anion gap: 12 (ref 5–15)
BUN: 18 mg/dL (ref 8–23)
BUN: 23 mg/dL (ref 8–23)
CO2: 22 mmol/L (ref 22–32)
CO2: 23 mmol/L (ref 22–32)
Calcium: 8.6 mg/dL — ABNORMAL LOW (ref 8.9–10.3)
Calcium: 8.8 mg/dL — ABNORMAL LOW (ref 8.9–10.3)
Chloride: 103 mmol/L (ref 98–111)
Chloride: 98 mmol/L (ref 98–111)
Creatinine, Ser: 1.03 mg/dL (ref 0.61–1.24)
Creatinine, Ser: 1.15 mg/dL (ref 0.61–1.24)
GFR, Estimated: >60 mL/min (ref >60)
GFR, Estimated: >60 mL/min (ref >60)
Glucose, Bld: 117 mg/dL — ABNORMAL HIGH (ref 70–99)
Glucose, Bld: 140 mg/dL — ABNORMAL HIGH (ref 70–99)
Potassium: 4.4 mmol/L (ref 3.5–5.1)
Potassium: 4.3 mmol/L (ref 3.5–5.1)
Sodium: 135 mmol/L (ref 135–145)
Sodium: 133 mmol/L — ABNORMAL LOW (ref 135–145)

## 2022-10-15 LAB — APTT
aPTT: 109 seconds — ABNORMAL HIGH (ref 24–36)
aPTT: 105 seconds — ABNORMAL HIGH (ref 24–36)

## 2022-10-15 LAB — HEPARIN LEVEL (UNFRACTIONATED)
Heparin Unfractionated: 0.43 IU/mL (ref 0.30–0.70)
Heparin Unfractionated: 0.54 IU/mL (ref 0.30–0.70)
Heparin Unfractionated: 0.37 IU/mL (ref 0.30–0.70)

## 2022-10-15 LAB — TROPONIN I (HIGH SENSITIVITY): Troponin I (High Sensitivity): 86 ng/L — ABNORMAL HIGH (ref <18)

## 2022-10-15 LAB — GLOBAL TEG PANEL
CFF Max Amplitude: 27.6 mm (ref 15–32)
CK with Heparinase (R): 9.7 min — ABNORMAL HIGH (ref 4.3–8.3)
Citrated Functional Fibrinogen: 503.6 mg/dL (ref 278–581)
Citrated Kaolin (K): >5 min — ABNORMAL HIGH (ref 0.8–2.1)
Citrated Kaolin (MA): 42.1 mm — ABNORMAL LOW (ref 52–69)
Citrated Kaolin (R): >17 min — ABNORMAL HIGH (ref 4.6–9.1)
Citrated Kaolin Angle: <39 deg — ABNORMAL LOW (ref 63–78)
Citrated Rapid TEG (MA): 59.8 mm (ref 52–70)

## 2022-10-15 LAB — PROTIME-INR
INR: 1.3 — ABNORMAL HIGH (ref 0.8–1.2)
INR: 1.3 — ABNORMAL HIGH (ref 0.8–1.2)
Prothrombin Time: 15.6 seconds — ABNORMAL HIGH (ref 11.4–15.2)
Prothrombin Time: 15.8 seconds — ABNORMAL HIGH (ref 11.4–15.2)

## 2022-10-15 LAB — TYPE AND SCREEN
ABO/RH(D): O NEG
Antibody Screen: NEGATIVE

## 2022-10-15 LAB — MAGNESIUM
Magnesium: 2 mg/dL (ref 1.7–2.4)
Magnesium: 2 mg/dL (ref 1.7–2.4)

## 2022-10-15 SURGERY — FASCIOTOMY, UPPER EXTREMITY
Anesthesia: General | Site: Arm Lower | Laterality: Right

## 2022-10-15 MED ORDER — LACTATED RINGERS IV SOLN: INTRAVENOUS | Status: DC | PRN

## 2022-10-15 MED ORDER — PHENYLEPHRINE 80 MCG/ML (10ML) SYRINGE FOR IV PUSH (FOR BLOOD PRESSURE SUPPORT)
PREFILLED_SYRINGE | INTRAVENOUS | Status: AC
  Filled 2022-10-15: qty 10

## 2022-10-15 MED ORDER — OXYCODONE HCL 5 MG PO TABS: 5.0000 mg | ORAL_TABLET | Freq: Once | ORAL | Status: DC | PRN

## 2022-10-15 MED ORDER — PROPOFOL 10 MG/ML IV BOLUS
INTRAVENOUS | Status: DC | PRN
  Administered 2022-10-15: 150 mg via INTRAVENOUS

## 2022-10-15 MED ORDER — LIDOCAINE 2% (20 MG/ML) 5 ML SYRINGE
INTRAMUSCULAR | Status: AC
  Filled 2022-10-15: qty 5

## 2022-10-15 MED ORDER — SUGAMMADEX SODIUM 200 MG/2ML IV SOLN
INTRAVENOUS | Status: DC | PRN
  Administered 2022-10-15: 200 mg via INTRAVENOUS

## 2022-10-15 MED ORDER — ONDANSETRON HCL 4 MG/2ML IJ SOLN
INTRAMUSCULAR | Status: AC
  Filled 2022-10-15: qty 2

## 2022-10-15 MED ORDER — FENTANYL CITRATE (PF) 250 MCG/5ML IJ SOLN
INTRAMUSCULAR | Status: DC | PRN
  Administered 2022-10-15: 100 ug via INTRAVENOUS
  Administered 2022-10-15: 50 ug via INTRAVENOUS
  Administered 2022-10-15: 100 ug via INTRAVENOUS

## 2022-10-15 MED ORDER — ROCURONIUM BROMIDE 10 MG/ML (PF) SYRINGE
PREFILLED_SYRINGE | INTRAVENOUS | Status: DC | PRN
  Administered 2022-10-15: 20 mg via INTRAVENOUS
  Administered 2022-10-15: 40 mg via INTRAVENOUS

## 2022-10-15 MED ORDER — OXIDIZED CELLULOSE EX PADS
1.0000 | MEDICATED_PAD | Freq: Once | CUTANEOUS | Status: DC
  Filled 2022-10-15: qty 1

## 2022-10-15 MED ORDER — PROMETHAZINE HCL 25 MG/ML IJ SOLN: 6.2500 mg | INTRAMUSCULAR | Status: DC | PRN

## 2022-10-15 MED ORDER — MEPERIDINE HCL 25 MG/ML IJ SOLN: 6.2500 mg | INTRAMUSCULAR | Status: DC | PRN

## 2022-10-15 MED ORDER — PHENYLEPHRINE HCL-NACL 20-0.9 MG/250ML-% IV SOLN
INTRAVENOUS | Status: DC | PRN
  Administered 2022-10-15: 20 ug/min via INTRAVENOUS

## 2022-10-15 MED ORDER — DEXAMETHASONE SODIUM PHOSPHATE 10 MG/ML IJ SOLN
INTRAMUSCULAR | Status: DC | PRN
  Administered 2022-10-15: 5 mg via INTRAVENOUS

## 2022-10-15 MED ORDER — PROPOFOL 10 MG/ML IV BOLUS
INTRAVENOUS | Status: AC
  Filled 2022-10-15: qty 20

## 2022-10-15 MED ORDER — HEPARIN 6000 UNIT IRRIGATION SOLUTION
Status: DC | PRN
  Administered 2022-10-15: 1

## 2022-10-15 MED ORDER — CEFAZOLIN SODIUM-DEXTROSE 2-3 GM-%(50ML) IV SOLR
INTRAVENOUS | Status: DC | PRN
  Administered 2022-10-15: 2 g via INTRAVENOUS

## 2022-10-15 MED ORDER — LIDOCAINE 2% (20 MG/ML) 5 ML SYRINGE
INTRAMUSCULAR | Status: DC | PRN
  Administered 2022-10-15: 60 mg via INTRAVENOUS

## 2022-10-15 MED ORDER — ROCURONIUM BROMIDE 10 MG/ML (PF) SYRINGE
PREFILLED_SYRINGE | INTRAVENOUS | Status: AC
  Filled 2022-10-15: qty 10

## 2022-10-15 MED ORDER — MIDAZOLAM HCL 2 MG/2ML IJ SOLN: 0.5000 mg | Freq: Once | INTRAMUSCULAR | Status: DC | PRN

## 2022-10-15 MED ORDER — FENTANYL CITRATE (PF) 100 MCG/2ML IJ SOLN
25.0000 ug | INTRAMUSCULAR | Status: DC | PRN
  Administered 2022-10-15: 50 ug via INTRAVENOUS

## 2022-10-15 MED ORDER — OXYCODONE HCL 5 MG PO TABS
5.0000 mg | ORAL_TABLET | ORAL | Status: DC | PRN
  Administered 2022-10-15 – 2022-10-16 (×2): 10 mg via ORAL
  Administered 2022-10-16: 5 mg via ORAL
  Administered 2022-10-16: 10 mg via ORAL
  Administered 2022-10-17: 5 mg via ORAL
  Administered 2022-10-17 – 2022-10-24 (×13): 10 mg via ORAL
  Filled 2022-10-15 (×14): qty 2
  Filled 2022-10-15: qty 1
  Filled 2022-10-15 (×2): qty 2
  Filled 2022-10-15: qty 1

## 2022-10-15 MED ORDER — DEXAMETHASONE SODIUM PHOSPHATE 10 MG/ML IJ SOLN
INTRAMUSCULAR | Status: AC
  Filled 2022-10-15: qty 1

## 2022-10-15 MED ORDER — HEPARIN (PORCINE) 25000 UT/250ML-% IV SOLN
1400.0000 [IU]/h | INTRAVENOUS | Status: DC
  Administered 2022-10-16 – 2022-10-17 (×2): 1000 [IU]/h via INTRAVENOUS
  Administered 2022-10-18 – 2022-10-19 (×2): 950 [IU]/h via INTRAVENOUS
  Administered 2022-10-21: 1200 [IU]/h via INTRAVENOUS
  Administered 2022-10-21: 950 [IU]/h via INTRAVENOUS
  Administered 2022-10-23: 1400 [IU]/h via INTRAVENOUS
  Filled 2022-10-15 (×8): qty 250

## 2022-10-15 MED ORDER — FENTANYL CITRATE PF 50 MCG/ML IJ SOSY
25.0000 ug | PREFILLED_SYRINGE | INTRAMUSCULAR | Status: DC | PRN
  Administered 2022-10-15 – 2022-10-16 (×5): 50 ug via INTRAVENOUS
  Administered 2022-10-17: 25 ug via INTRAVENOUS
  Administered 2022-10-20 – 2022-10-23 (×9): 50 ug via INTRAVENOUS
  Filled 2022-10-15 (×15): qty 1

## 2022-10-15 MED ORDER — SUCCINYLCHOLINE CHLORIDE 200 MG/10ML IV SOSY
PREFILLED_SYRINGE | INTRAVENOUS | Status: DC | PRN
  Administered 2022-10-15: 120 mg via INTRAVENOUS

## 2022-10-15 MED ORDER — FENTANYL CITRATE (PF) 100 MCG/2ML IJ SOLN
INTRAMUSCULAR | Status: AC
  Filled 2022-10-15: qty 2

## 2022-10-15 MED ORDER — FENTANYL CITRATE (PF) 250 MCG/5ML IJ SOLN
INTRAMUSCULAR | Status: AC
  Filled 2022-10-15: qty 5

## 2022-10-15 MED ORDER — HEPARIN 6000 UNIT IRRIGATION SOLUTION
Status: AC
  Filled 2022-10-15: qty 500

## 2022-10-15 MED ORDER — PHENYLEPHRINE 80 MCG/ML (10ML) SYRINGE FOR IV PUSH (FOR BLOOD PRESSURE SUPPORT)
PREFILLED_SYRINGE | INTRAVENOUS | Status: DC | PRN
  Administered 2022-10-15 (×3): 80 ug via INTRAVENOUS

## 2022-10-15 MED ORDER — 0.9 % SODIUM CHLORIDE (POUR BTL) OPTIME
TOPICAL | Status: DC | PRN
  Administered 2022-10-15: 1000 mL

## 2022-10-15 MED ORDER — OXYCODONE HCL 5 MG/5ML PO SOLN: 5.0000 mg | Freq: Once | ORAL | Status: DC | PRN

## 2022-10-15 MED ORDER — SODIUM CHLORIDE 0.9% IV SOLUTION: Freq: Once | INTRAVENOUS | Status: DC

## 2022-10-15 SURGICAL SUPPLY — 50 items
APL SKNCLS STERI-STRIP NONHPOA (GAUZE/BANDAGES/DRESSINGS) ×1
BAG COUNTER SPONGE SURGICOUNT (BAG) ×1 IMPLANT
BAG SPNG CNTER NS LX DISP (BAG) ×1
BENZOIN TINCTURE PRP APPL 2/3 (GAUZE/BANDAGES/DRESSINGS) IMPLANT
BLADE SURG 15 STRL LF DISP TIS (BLADE) IMPLANT
BLADE SURG 15 STRL SS (BLADE) ×1
BNDG ELASTIC 4X5.8 VLCR STR LF (GAUZE/BANDAGES/DRESSINGS) IMPLANT
BNDG ELASTIC 6X5.8 VLCR STR LF (GAUZE/BANDAGES/DRESSINGS) IMPLANT
BNDG GAUZE DERMACEA FLUFF 4 (GAUZE/BANDAGES/DRESSINGS) IMPLANT
BNDG GZE DERMACEA 4 6PLY (GAUZE/BANDAGES/DRESSINGS) ×1
CANISTER SUCT 3000ML PPV (MISCELLANEOUS) ×1 IMPLANT
CANISTER WOUNDNEG PRESSURE 500 (CANNISTER) IMPLANT
CATH EMB 2FR 60 (CATHETERS) IMPLANT
CLIP TI MEDIUM 24 (CLIP) IMPLANT
CLIP TI WIDE RED SMALL 24 (CLIP) IMPLANT
COVER PROBE CYLINDRICAL 5X96 (MISCELLANEOUS) IMPLANT
DRAPE DERMATAC (DRAPES) IMPLANT
DRAPE HALF SHEET 40X57 (DRAPES) IMPLANT
DRAPE INCISE IOBAN 66X45 STRL (DRAPES) IMPLANT
DRAPE ORTHO SPLIT 77X108 STRL (DRAPES)
DRAPE SURG ORHT 6 SPLT 77X108 (DRAPES) IMPLANT
DRESSING PREVENA PLUS CUSTOM (GAUZE/BANDAGES/DRESSINGS) IMPLANT
DRSG PREVENA PLUS CUSTOM (GAUZE/BANDAGES/DRESSINGS)
ELECT REM PT RETURN 9FT ADLT (ELECTROSURGICAL) ×1
ELECTRODE REM PT RTRN 9FT ADLT (ELECTROSURGICAL) ×1 IMPLANT
GAUZE SPONGE 4X4 12PLY STRL (GAUZE/BANDAGES/DRESSINGS) ×1 IMPLANT
GAUZE XEROFORM 5X9 LF (GAUZE/BANDAGES/DRESSINGS) IMPLANT
GLOVE BIO SURGEON STRL SZ8 (GLOVE) ×1 IMPLANT
GOWN STRL REUS W/ TWL LRG LVL3 (GOWN DISPOSABLE) ×3 IMPLANT
GOWN STRL REUS W/TWL LRG LVL3 (GOWN DISPOSABLE) ×2
KIT BASIN OR (CUSTOM PROCEDURE TRAY) ×1 IMPLANT
KIT TURNOVER KIT B (KITS) ×1 IMPLANT
NS IRRIG 1000ML POUR BTL (IV SOLUTION) ×1 IMPLANT
PACK CV ACCESS (CUSTOM PROCEDURE TRAY) IMPLANT
PACK GENERAL/GYN (CUSTOM PROCEDURE TRAY) IMPLANT
PACK UNIVERSAL I (CUSTOM PROCEDURE TRAY) ×1 IMPLANT
PAD ARMBOARD 7.5X6 YLW CONV (MISCELLANEOUS) ×2 IMPLANT
PAD CAST 4YDX4 CTTN HI CHSV (CAST SUPPLIES) IMPLANT
PADDING CAST COTTON 4X4 STRL (CAST SUPPLIES) ×1
SUT ETHILON 2 0 PSLX (SUTURE) IMPLANT
SUT ETHILON 3 0 PS 1 (SUTURE) IMPLANT
SUT ETHILON 4 0 PS 2 18 (SUTURE) IMPLANT
SUT ETHILON O TP 1 (SUTURE) IMPLANT
SUT MNCRL AB 4-0 PS2 18 (SUTURE) IMPLANT
SUT PROLENE 6 0 BV (SUTURE) IMPLANT
SUT VIC AB 2-0 CT1 27 (SUTURE)
SUT VIC AB 2-0 CT1 TAPERPNT 27 (SUTURE) ×1 IMPLANT
SUT VICRYL 4-0 PS2 18IN ABS (SUTURE) IMPLANT
TOWEL GREEN STERILE (TOWEL DISPOSABLE) ×1 IMPLANT
WATER STERILE IRR 1000ML POUR (IV SOLUTION) ×1 IMPLANT

## 2022-10-15 NOTE — Consult Note (Addendum)
HAND SURGERY CONSULTATION  REQUESTING PHYSICIAN: Julian Hy, DO   Chief Complaint: Right arm and hand swelling  HPI: Ralph Blackwell is a 82 y.o. male who was admitted on 4/01 for DOE and chest pain. He underwent cardiac catheterization which was unremarkable. He was subsequently found to have a saddle PE an underwent catheter directed thrombolysis. He developed significant swelling and ecchymosis of right forearm and hand. I was called to evaluate for potential compartment syndrome of hand and forearm. He denies pain in the forearm but describes a feeling of "tightness" and "puffiness." He denies numbness or paresthesias in the hand and notes that his fingers feel the same as the contralateral side.   Past Medical History:  Diagnosis Date   Coronary artery disease    GERD (gastroesophageal reflux disease)    Gout    History of hiatal hernia    Hypertension    Pulmonary embolism 2008 after knee surgey   Renal insufficiency    hx renal failure 2008 after blood clots   Sleep apnea    Past Surgical History:  Procedure Laterality Date   arthroscopic knee surgery  2004   BACK SURGERY  nov 1979 and 1998   lower   COLONOSCOPY WITH PROPOFOL N/A 10/20/2016   Procedure: COLONOSCOPY WITH PROPOFOL;  Surgeon: Garlan Fair, MD;  Location: [facility];  Service: Endoscopy;  Laterality: N/A;   IR ANGIOGRAM PULMONARY BILATERAL SELECTIVE  10/14/2022   IR ANGIOGRAM SELECTIVE EACH ADDITIONAL VESSEL  10/14/2022   IR ANGIOGRAM SELECTIVE EACH ADDITIONAL VESSEL  10/14/2022   IR INFUSION THROMBOL ARTERIAL INITIAL (MS)  10/14/2022   IR INFUSION THROMBOL ARTERIAL INITIAL (MS)  10/14/2022   IR US GUIDE VASC ACCESS RIGHT  10/14/2022   JOINT REPLACEMENT Left 2007   knee    LEFT HEART CATH AND CORONARY ANGIOGRAPHY N/A 06/30/2017   Procedure: LEFT HEART CATH AND CORONARY ANGIOGRAPHY;  Surgeon: Nigel Mormon, MD;  Location: [facility];  Service: Cardiovascular;  Laterality: N/A;   LEFT HEART CATH  AND CORONARY ANGIOGRAPHY N/A 10/13/2022   Procedure: LEFT HEART CATH AND CORONARY ANGIOGRAPHY;  Surgeon: Nigel Mormon, MD;  Location: [facility];  Service: Cardiovascular;  Laterality: N/A;   left wrist plating   2005   titanium plate   right total knee replacement Right 2009   Social History   Socioeconomic History   Marital status: Married    Spouse name: Not on file   Number of children: 2   Years of education: Not on file   Highest education level: Not on file  Occupational History   Not on file  Tobacco Use   Smoking status: Never   Smokeless tobacco: Never  Vaping Use   Vaping Use: Never used  Substance and Sexual Activity   Alcohol use: No   Drug use: No   Sexual activity: Not on file  Other Topics Concern   Not on file  Social History Narrative   Not on file   Social Determinants of Health   Financial Resource Strain: Not on file  Food Insecurity: No Food Insecurity (10/13/2022)   Hunger Vital Sign    Worried About Running Out of Food in the Last Year: Never true    Ran Out of Food in the Last Year: Never true  Transportation Needs: No Transportation Needs (10/13/2022)   PRAPARE - Hydrologist (Medical): No    Lack of Transportation (Non-Medical): No  Physical  Activity: Not on file  Stress: Not on file  Social Connections: Not on file   Family History  Problem Relation Age of Onset   Heart disease Mother    Hypertension Mother    Heart disease Father    Heart attack Father    Hypertension Father    - negative except otherwise stated in the family history section No Known Allergies Prior to Admission medications   Medication Sig Start Date End Date Taking? Authorizing Provider  allopurinol (ZYLOPRIM) 300 MG tablet Take 150 mg by mouth daily.    Yes [provider]  ascorbic acid (VITAMIN C) 500 MG tablet Take 500 mg by mouth daily.   Yes [provider]  Cholecalciferol (VITAMIN D3) 5000 units  CAPS Take 2,000 Units by mouth daily with supper.    Yes [provider]  finasteride (PROSCAR) 5 MG tablet Take 5 mg by mouth at bedtime.    Yes [provider]  fluticasone (FLONASE) 50 MCG/ACT nasal spray Place 1 spray into both nostrils every evening.   Yes [provider]  magnesium gluconate (MAGONATE) 500 MG tablet Take 500 mg by mouth daily.   Yes [provider]  nitroGLYCERIN (NITROSTAT) 0.4 MG SL tablet Place 0.4 mg under the tongue every 5 (five) minutes as needed for chest pain.   Yes [provider]  Omega-3 Fatty Acids (FISH OIL) 1000 MG CAPS Take 1,000 mg by mouth daily.   Yes [provider]  omeprazole (PRILOSEC) 40 MG capsule Take 1 capsule (40 mg total) by mouth daily. 04/21/22  Yes Patwardhan, Manish J, MD  rosuvastatin (CRESTOR) 20 MG tablet Take 1 tablet (20 mg total) by mouth daily. 06/30/22 06/25/23 Yes Patwardhan, Manish J, MD  telmisartan (MICARDIS) 80 MG tablet Take 40 mg by mouth daily.   Yes [provider]  zinc gluconate 50 MG tablet Take 50 mg by mouth daily.   Yes [provider]   VAS [place] LOWER EXTREMITY VENOUS (DVT)  Result Date: 10/14/2022  Lower Venous DVT Study Patient Name:  Ralph Blackwell  Date of Exam:   10/14/2022 Medical Rec #: [id number]        Accession #:    [id number] Date of Birth: 05/01/1941        Patient Gender: M Patient Age:   28 years Exam Location:  [facility] Procedure:      VAS [place] LOWER EXTREMITY VENOUS (DVT) Referring Phys: Noe Gens --------------------------------------------------------------------------------  Indications: Pulmonary embolism.  Risk Factors: Confirmed PE. Anticoagulation: Heparin. Limitations: Bandages, line and patient positioning, patient immobility. Comparison Study: No prior studies. Performing Technologist: Oliver Hum RVT  Examination Guidelines: A complete evaluation includes B-mode imaging, spectral Doppler, color Doppler, and power  Doppler as needed of all accessible portions of each vessel. Bilateral testing is considered an integral part of a complete examination. Limited examinations for reoccurring indications may be performed as noted. The reflux portion of the exam is performed with the patient in reverse Trendelenburg.  +---------+---------------+---------+-----------+----------+--------------+ RIGHT    CompressibilityPhasicitySpontaneityPropertiesThrombus Aging +---------+---------------+---------+-----------+----------+--------------+ CFV      Full           Yes      Yes                                 +---------+---------------+---------+-----------+----------+--------------+ SFJ      Full                                                        +---------+---------------+---------+-----------+----------+--------------+  FV Prox  Full                                                        +---------+---------------+---------+-----------+----------+--------------+ FV Mid   Full                                                        +---------+---------------+---------+-----------+----------+--------------+ FV DistalFull                                                        +---------+---------------+---------+-----------+----------+--------------+ PFV      Full                                                        +---------+---------------+---------+-----------+----------+--------------+ POP      None           No       No                   Acute          +---------+---------------+---------+-----------+----------+--------------+ PTV      Full                                                        +---------+---------------+---------+-----------+----------+--------------+ PERO     Full                                                        +---------+---------------+---------+-----------+----------+--------------+ Gastroc  Partial                                       Acute          +---------+---------------+---------+-----------+----------+--------------+   +---------+---------------+---------+-----------+----------+--------------+ LEFT     CompressibilityPhasicitySpontaneityPropertiesThrombus Aging +---------+---------------+---------+-----------+----------+--------------+ CFV      Full           Yes      Yes                                 +---------+---------------+---------+-----------+----------+--------------+ SFJ      Full                                                        +---------+---------------+---------+-----------+----------+--------------+  FV Prox  Full                                                        +---------+---------------+---------+-----------+----------+--------------+ FV Mid   Full                                                        +---------+---------------+---------+-----------+----------+--------------+ FV DistalFull                                                        +---------+---------------+---------+-----------+----------+--------------+ PFV      Full                                                        +---------+---------------+---------+-----------+----------+--------------+ POP      Full           Yes      Yes                                 +---------+---------------+---------+-----------+----------+--------------+ PTV      Full                                                        +---------+---------------+---------+-----------+----------+--------------+ PERO     Full                                                        +---------+---------------+---------+-----------+----------+--------------+     Summary: RIGHT: - Findings consistent with acute deep vein thrombosis involving the right popliteal vein, and right gastrocnemius veins. - No cystic structure found in the popliteal fossa.  LEFT: - There is no evidence of deep vein thrombosis in  the lower extremity.  - No cystic structure found in the popliteal fossa.  *See table(s) above for measurements and observations. Electronically signed by Deitra Mayo MD on 10/14/2022 at 4:22:02 PM.    Final    IR US Guide Vasc Access Right  Result Date: 10/14/2022 INDICATION: Submassive saddle pulmonary embolus, right heart strain, chest discomfort despite heparin therapy EXAM: ULTRASOUND GUIDANCE FOR VASCULAR ACCESS BILATERAL PULMONARY ARTERY CATHETERIZATIONS, ANGIOGRAMS, PRESSURE MEASUREMENT, AND INFUSION CATHETER PLACEMENTS FOR PE THROMBO LYSIS COMPARISON:  None Available. MEDICATIONS: 1% lidocaine local ANESTHESIA/SEDATION: Moderate (conscious) sedation was employed during this procedure. A total of Versed 1.5 mg and Fentanyl 50 mcg was administered intravenously by the radiology nurse. Total intra-service moderate Sedation Time: The 40 minutes. The patient's level of consciousness and  vital signs were monitored continuously by radiology nursing throughout the procedure under my direct supervision. FLUOROSCOPY: Radiation Exposure Index (as provided by the fluoroscopic device): Q000111Q mGy Kerma COMPLICATIONS: None immediate. TECHNIQUE: Informed written consent was obtained from the patient after a thorough discussion of the procedural risks, benefits and alternatives. All questions were addressed. Maximal Sterile Barrier Technique was utilized including caps, mask, sterile gowns, sterile gloves, sterile drape, hand hygiene and skin antiseptic. A timeout was performed prior to the initiation of the procedure. Under sterile conditions and local anesthesia, ultrasound micropuncture access performed the right common femoral vein twice. Adjacent 6 French sheath were inserted over Bentson guidewires into the right common femoral vein. Images obtained for documentation of the patent right common femoral vein. Five [place] PA catheters were advanced into the pulmonary arteries bilaterally utilizing both Bentson and  035 glide wires. Contrast injection performed for bilateral limited pulmonary angiography. Nearly occlusive central thrombus present on the right. More peripheral nonocclusive thrombus present on the left. Central PA pressure measurement: 42/15 (25). Over bilateral Rosen guidewires, the 90 cm infusion catheters with 10 cm infusion length were advanced into the pulmonary arteries. Internal occlusion wires advanced. Images obtained for documentation of catheter positions. Access secured with a silk suture. 12 hour thrombolysis protocol will be initiated through the bilateral pulmonary artery UniFuse catheters. FINDINGS: Imaging confirms right greater than left central pulmonary emboli better demonstrated by the comparison CTA. Successful placement of bilateral 90 cm infusion catheters with 10 cm infusion leaks into both pulmonary arteries. IMPRESSION: Successful placement of bilateral pulmonary artery infusion catheters for PE thrombolysis 12 hour protocol. Electronically Signed   By: Jerilynn Mages.  Shick M.D.   On: 10/14/2022 13:10   IR INFUSION THROMBOL ARTERIAL INITIAL (MS)  Result Date: 10/14/2022 INDICATION: Submassive saddle pulmonary embolus, right heart strain, chest discomfort despite heparin therapy EXAM: ULTRASOUND GUIDANCE FOR VASCULAR ACCESS BILATERAL PULMONARY ARTERY CATHETERIZATIONS, ANGIOGRAMS, PRESSURE MEASUREMENT, AND INFUSION CATHETER PLACEMENTS FOR PE THROMBO LYSIS COMPARISON:  None Available. MEDICATIONS: 1% lidocaine local ANESTHESIA/SEDATION: Moderate (conscious) sedation was employed during this procedure. A total of Versed 1.5 mg and Fentanyl 50 mcg was administered intravenously by the radiology nurse. Total intra-service moderate Sedation Time: The 40 minutes. The patient's level of consciousness and vital signs were monitored continuously by radiology nursing throughout the procedure under my direct supervision. FLUOROSCOPY: Radiation Exposure Index (as provided by the fluoroscopic device): Q000111Q mGy  Kerma COMPLICATIONS: None immediate. TECHNIQUE: Informed written consent was obtained from the patient after a thorough discussion of the procedural risks, benefits and alternatives. All questions were addressed. Maximal Sterile Barrier Technique was utilized including caps, mask, sterile gowns, sterile gloves, sterile drape, hand hygiene and skin antiseptic. A timeout was performed prior to the initiation of the procedure. Under sterile conditions and local anesthesia, ultrasound micropuncture access performed the right common femoral vein twice. Adjacent 6 French sheath were inserted over Bentson guidewires into the right common femoral vein. Images obtained for documentation of the patent right common femoral vein. Five [place] PA catheters were advanced into the pulmonary arteries bilaterally utilizing both Bentson and 035 glide wires. Contrast injection performed for bilateral limited pulmonary angiography. Nearly occlusive central thrombus present on the right. More peripheral nonocclusive thrombus present on the left. Central PA pressure measurement: 42/15 (25). Over bilateral Rosen guidewires, the 90 cm infusion catheters with 10 cm infusion length were advanced into the pulmonary arteries. Internal occlusion wires advanced. Images obtained for documentation of catheter positions. Access secured with a silk suture. 12 hour  thrombolysis protocol will be initiated through the bilateral pulmonary artery UniFuse catheters. FINDINGS: Imaging confirms right greater than left central pulmonary emboli better demonstrated by the comparison CTA. Successful placement of bilateral 90 cm infusion catheters with 10 cm infusion leaks into both pulmonary arteries. IMPRESSION: Successful placement of bilateral pulmonary artery infusion catheters for PE thrombolysis 12 hour protocol. Electronically Signed   By: Jerilynn Mages.  Shick M.D.   On: 10/14/2022 13:10   IR INFUSION THROMBOL ARTERIAL INITIAL (MS)  Result Date:  10/14/2022 INDICATION: Submassive saddle pulmonary embolus, right heart strain, chest discomfort despite heparin therapy EXAM: ULTRASOUND GUIDANCE FOR VASCULAR ACCESS BILATERAL PULMONARY ARTERY CATHETERIZATIONS, ANGIOGRAMS, PRESSURE MEASUREMENT, AND INFUSION CATHETER PLACEMENTS FOR PE THROMBO LYSIS COMPARISON:  None Available. MEDICATIONS: 1% lidocaine local ANESTHESIA/SEDATION: Moderate (conscious) sedation was employed during this procedure. A total of Versed 1.5 mg and Fentanyl 50 mcg was administered intravenously by the radiology nurse. Total intra-service moderate Sedation Time: The 40 minutes. The patient's level of consciousness and vital signs were monitored continuously by radiology nursing throughout the procedure under my direct supervision. FLUOROSCOPY: Radiation Exposure Index (as provided by the fluoroscopic device): Q000111Q mGy Kerma COMPLICATIONS: None immediate. TECHNIQUE: Informed written consent was obtained from the patient after a thorough discussion of the procedural risks, benefits and alternatives. All questions were addressed. Maximal Sterile Barrier Technique was utilized including caps, mask, sterile gowns, sterile gloves, sterile drape, hand hygiene and skin antiseptic. A timeout was performed prior to the initiation of the procedure. Under sterile conditions and local anesthesia, ultrasound micropuncture access performed the right common femoral vein twice. Adjacent 6 French sheath were inserted over Bentson guidewires into the right common femoral vein. Images obtained for documentation of the patent right common femoral vein. Five [place] PA catheters were advanced into the pulmonary arteries bilaterally utilizing both Bentson and 035 glide wires. Contrast injection performed for bilateral limited pulmonary angiography. Nearly occlusive central thrombus present on the right. More peripheral nonocclusive thrombus present on the left. Central PA pressure measurement: 42/15 (25). Over  bilateral Rosen guidewires, the 90 cm infusion catheters with 10 cm infusion length were advanced into the pulmonary arteries. Internal occlusion wires advanced. Images obtained for documentation of catheter positions. Access secured with a silk suture. 12 hour thrombolysis protocol will be initiated through the bilateral pulmonary artery UniFuse catheters. FINDINGS: Imaging confirms right greater than left central pulmonary emboli better demonstrated by the comparison CTA. Successful placement of bilateral 90 cm infusion catheters with 10 cm infusion leaks into both pulmonary arteries. IMPRESSION: Successful placement of bilateral pulmonary artery infusion catheters for PE thrombolysis 12 hour protocol. Electronically Signed   By: Jerilynn Mages.  Shick M.D.   On: 10/14/2022 13:10   IR Angiogram Pulmonary Bilateral Selective  Result Date: 10/14/2022 INDICATION: Submassive saddle pulmonary embolus, right heart strain, chest discomfort despite heparin therapy EXAM: ULTRASOUND GUIDANCE FOR VASCULAR ACCESS BILATERAL PULMONARY ARTERY CATHETERIZATIONS, ANGIOGRAMS, PRESSURE MEASUREMENT, AND INFUSION CATHETER PLACEMENTS FOR PE THROMBO LYSIS COMPARISON:  None Available. MEDICATIONS: 1% lidocaine local ANESTHESIA/SEDATION: Moderate (conscious) sedation was employed during this procedure. A total of Versed 1.5 mg and Fentanyl 50 mcg was administered intravenously by the radiology nurse. Total intra-service moderate Sedation Time: The 40 minutes. The patient's level of consciousness and vital signs were monitored continuously by radiology nursing throughout the procedure under my direct supervision. FLUOROSCOPY: Radiation Exposure Index (as provided by the fluoroscopic device): Q000111Q mGy Kerma COMPLICATIONS: None immediate. TECHNIQUE: Informed written consent was obtained from the patient after a thorough discussion of the procedural risks,  benefits and alternatives. All questions were addressed. Maximal Sterile Barrier Technique was  utilized including caps, mask, sterile gowns, sterile gloves, sterile drape, hand hygiene and skin antiseptic. A timeout was performed prior to the initiation of the procedure. Under sterile conditions and local anesthesia, ultrasound micropuncture access performed the right common femoral vein twice. Adjacent 6 French sheath were inserted over Bentson guidewires into the right common femoral vein. Images obtained for documentation of the patent right common femoral vein. Five [place] PA catheters were advanced into the pulmonary arteries bilaterally utilizing both Bentson and 035 glide wires. Contrast injection performed for bilateral limited pulmonary angiography. Nearly occlusive central thrombus present on the right. More peripheral nonocclusive thrombus present on the left. Central PA pressure measurement: 42/15 (25). Over bilateral Rosen guidewires, the 90 cm infusion catheters with 10 cm infusion length were advanced into the pulmonary arteries. Internal occlusion wires advanced. Images obtained for documentation of catheter positions. Access secured with a silk suture. 12 hour thrombolysis protocol will be initiated through the bilateral pulmonary artery UniFuse catheters. FINDINGS: Imaging confirms right greater than left central pulmonary emboli better demonstrated by the comparison CTA. Successful placement of bilateral 90 cm infusion catheters with 10 cm infusion leaks into both pulmonary arteries. IMPRESSION: Successful placement of bilateral pulmonary artery infusion catheters for PE thrombolysis 12 hour protocol. Electronically Signed   By: Jerilynn Mages.  Shick M.D.   On: 10/14/2022 13:10   IR Angiogram Selective Each Additional Vessel  Result Date: 10/14/2022 INDICATION: Submassive saddle pulmonary embolus, right heart strain, chest discomfort despite heparin therapy EXAM: ULTRASOUND GUIDANCE FOR VASCULAR ACCESS BILATERAL PULMONARY ARTERY CATHETERIZATIONS, ANGIOGRAMS, PRESSURE MEASUREMENT, AND INFUSION  CATHETER PLACEMENTS FOR PE THROMBO LYSIS COMPARISON:  None Available. MEDICATIONS: 1% lidocaine local ANESTHESIA/SEDATION: Moderate (conscious) sedation was employed during this procedure. A total of Versed 1.5 mg and Fentanyl 50 mcg was administered intravenously by the radiology nurse. Total intra-service moderate Sedation Time: The 40 minutes. The patient's level of consciousness and vital signs were monitored continuously by radiology nursing throughout the procedure under my direct supervision. FLUOROSCOPY: Radiation Exposure Index (as provided by the fluoroscopic device): Q000111Q mGy Kerma COMPLICATIONS: None immediate. TECHNIQUE: Informed written consent was obtained from the patient after a thorough discussion of the procedural risks, benefits and alternatives. All questions were addressed. Maximal Sterile Barrier Technique was utilized including caps, mask, sterile gowns, sterile gloves, sterile drape, hand hygiene and skin antiseptic. A timeout was performed prior to the initiation of the procedure. Under sterile conditions and local anesthesia, ultrasound micropuncture access performed the right common femoral vein twice. Adjacent 6 French sheath were inserted over Bentson guidewires into the right common femoral vein. Images obtained for documentation of the patent right common femoral vein. Five [place] PA catheters were advanced into the pulmonary arteries bilaterally utilizing both Bentson and 035 glide wires. Contrast injection performed for bilateral limited pulmonary angiography. Nearly occlusive central thrombus present on the right. More peripheral nonocclusive thrombus present on the left. Central PA pressure measurement: 42/15 (25). Over bilateral Rosen guidewires, the 90 cm infusion catheters with 10 cm infusion length were advanced into the pulmonary arteries. Internal occlusion wires advanced. Images obtained for documentation of catheter positions. Access secured with a silk suture. 12 hour  thrombolysis protocol will be initiated through the bilateral pulmonary artery UniFuse catheters. FINDINGS: Imaging confirms right greater than left central pulmonary emboli better demonstrated by the comparison CTA. Successful placement of bilateral 90 cm infusion catheters with 10 cm infusion leaks into both pulmonary arteries. IMPRESSION: Successful  placement of bilateral pulmonary artery infusion catheters for PE thrombolysis 12 hour protocol. Electronically Signed   By: Jerilynn Mages.  Shick M.D.   On: 10/14/2022 13:10   IR Angiogram Selective Each Additional Vessel  Result Date: 10/14/2022 INDICATION: Submassive saddle pulmonary embolus, right heart strain, chest discomfort despite heparin therapy EXAM: ULTRASOUND GUIDANCE FOR VASCULAR ACCESS BILATERAL PULMONARY ARTERY CATHETERIZATIONS, ANGIOGRAMS, PRESSURE MEASUREMENT, AND INFUSION CATHETER PLACEMENTS FOR PE THROMBO LYSIS COMPARISON:  None Available. MEDICATIONS: 1% lidocaine local ANESTHESIA/SEDATION: Moderate (conscious) sedation was employed during this procedure. A total of Versed 1.5 mg and Fentanyl 50 mcg was administered intravenously by the radiology nurse. Total intra-service moderate Sedation Time: The 40 minutes. The patient's level of consciousness and vital signs were monitored continuously by radiology nursing throughout the procedure under my direct supervision. FLUOROSCOPY: Radiation Exposure Index (as provided by the fluoroscopic device): [id number] mGy Kerma COMPLICATIONS: None immediate. TECHNIQUE: Informed written consent was obtained from the patient after a thorough discussion of the procedural risks, benefits and alternatives. All questions were addressed. Maximal Sterile Barrier Technique was utilized including caps, mask, sterile gowns, sterile gloves, sterile drape, hand hygiene and skin antiseptic. A timeout was performed prior to the initiation of the procedure. Under sterile conditions and local anesthesia, ultrasound micropuncture access  performed the right common femoral vein twice. Adjacent 6 French sheath were inserted over Bentson guidewires into the right common femoral vein. Images obtained for documentation of the patent right common femoral vein. Five [place] PA catheters were advanced into the pulmonary arteries bilaterally utilizing both Bentson and 035 glide wires. Contrast injection performed for bilateral limited pulmonary angiography. Nearly occlusive central thrombus present on the right. More peripheral nonocclusive thrombus present on the left. Central PA pressure measurement: 42/15 (25). Over bilateral Rosen guidewires, the 90 cm infusion catheters with 10 cm infusion length were advanced into the pulmonary arteries. Internal occlusion wires advanced. Images obtained for documentation of catheter positions. Access secured with a silk suture. 12 hour thrombolysis protocol will be initiated through the bilateral pulmonary artery UniFuse catheters. FINDINGS: Imaging confirms right greater than left central pulmonary emboli better demonstrated by the comparison CTA. Successful placement of bilateral 90 cm infusion catheters with 10 cm infusion leaks into both pulmonary arteries. IMPRESSION: Successful placement of bilateral pulmonary artery infusion catheters for PE thrombolysis 12 hour protocol. Electronically Signed   By: Jerilynn Mages.  Shick M.D.   On: 10/14/2022 13:10   CT Angio Chest Pulmonary Embolism (PE) W or WO Contrast  Addendum Date: 10/13/2022   ADDENDUM REPORT: 10/13/2022 15:59 ADDENDUM: Critical Value/emergent results were called by telephone at the time of interpretation on 10/13/2022 at 3:59 pm to provider [facility] , who verbally acknowledged these results. Electronically Signed   By: Lajean Manes M.D.   On: 10/13/2022 15:59   Result Date: 10/13/2022 CLINICAL DATA:  Unstable angina. Inpatient. Clinical suspicion for pulmonary embolism. EXAM: CT ANGIOGRAPHY CHEST WITH CONTRAST TECHNIQUE: Multidetector CT imaging of the chest  was performed using the standard protocol during bolus administration of intravenous contrast. Multiplanar CT image reconstructions and MIPs were obtained to evaluate the vascular anatomy. RADIATION DOSE REDUCTION: This exam was performed according to the departmental dose-optimization program which includes automated exposure control, adjustment of the mA and/or kV according to patient size and/or use of iterative reconstruction technique. CONTRAST:  55mL OMNIPAQUE IOHEXOL 350 MG/ML SOLN COMPARISON:  01/25/2013.  Chest radiograph, 10/11/2022. FINDINGS: Cardiovascular:  Pulmonary arteries are well opacified. There are bilateral pulmonary emboli, greater on the right. A saddle embolus spans  the right and left pulmonary arteries. Near occlusive emboli extend into the right lower lobe segmental pulmonary arteries. Nonocclusive emboli are noted in the right middle and upper lobe central pulmonary arteries. On the left nonocclusive pulmonary embolism extends from the saddle embolism into the central lingular branch pulmonary artery. RV LV ratio is 1.18. Heart normal in size. No pericardial effusion. Great vessels are normal in caliber. Mild aortic atherosclerosis. No dissection. Mediastinum/Nodes: Significant enlargement of the left thyroid lobe that extends substernally. This represents a notable change from the chest CT dated 01/25/2013. No other mediastinal masses. No hilar masses. No mediastinal or hilar adenopathy. Trachea and esophagus are unremarkable. Lungs/Pleura: Minor basilar subsegmental atelectasis. Lungs otherwise clear. No pleural effusion or pneumothorax. Upper Abdomen: No acute finding. Musculoskeletal: No fracture or acute finding. No bone lesion. No chest wall mass. Review of the MIP images confirms the above findings. IMPRESSION: 1. Positive for pulmonary emboli. Right greater than left pulmonary emboli with a saddle pulmonary embolus as detailed. Positive for acute PE with CT evidence of right heart  strain (RV/LV Ratio = 1.18) consistent with at least submassive (intermediate risk) PE. The presence of right heart strain has been associated with an increased risk of morbidity and mortality. Please refer to the "Code PE Focused" order set in EPIC. 2. No other acute finding. 3. Significant enlargement of the left thyroid lobe that extends substernally. Recommend thyroid ultrasound (ref: J Am Coll Radiol. 2015 Feb;12(2): 143-50). Aortic Atherosclerosis (ICD10-I70.0). Electronically Signed: By: Lajean Manes M.D. On: 10/13/2022 15:50   - Positive ROS: All other systems have been reviewed and were otherwise negative with the exception of those mentioned in the HPI and as above.  Physical Exam: General: No acute distress, resting comfortably Cardiovascular: BUE warm and well perfused, normal rate Respiratory: Normal WOB on RA Skin: Warm and dry Neurologic: Sensation intact distally Psychiatric: Patient is at baseline mood and affect  Right Upper Extremity  Significant swelling from elbow into hand.  Ecchymosis of volar distal forearm with mild overlying bullae.  Mildly TTP in forearm with compartments still soft and compressible.  Significant and diffuse hand swelling.  Non TTP over hand compartments with obvious swelling and puffiness but no compartment tension.  He is able to flex and extend the fingers but is limited by swelling.  No pain w/ passive or active flexion and extension of the fingers.  SILT in m/u/r distribution in hand and in all fingers that is symmetric to the contralateral side.  The hand is warm and well perfused.   Assessment: 82 yo M s/p cardiac catheterization and subsequent catheter directed thrombolysis for saddle pulmonary embolism.  He has significant right forearm and hand swelling but no concern for acute compartment syndrome at this time given minimal pain, no compartment tension, no pain on passive stretch of the fingers and wrist, and no pain w/ AROM of fingers. He has  normal sensation on exam without subjective numbness or paresthesias.   Plan: - Recommend strict and diligent elevation of the RUE to help with swelling - Recommend moving blood pressure cuff to contralateral limb.  Discussed this with patient's nurse - Had a very thorough discussion with both patient and his wife regarding the nature of compartment syndrome including the relevant anatomy of the forearm and hand compartments, the mechanism of compartment syndrome, the concerning signs and symptoms, and the necessary steps in treatment - Patient will monitor his symptoms for change  Thank you for the consult and the opportunity to  see Mr. Rosealee Albee, M.D. [facility] 11:30 AM

## 2022-10-15 NOTE — Op Note (Signed)
Date: October 15, 2022  Preoperative diagnosis: 1.  Right forearm compartment syndrome 2.  Right radial artery pseudoaneurysm  Postoperative diagnosis: Same  Procedure: 1.  Right forearm fasciotomy with carpal tunnel releases (dictated by Dr. Tempie Donning) 2.  Right radial artery thrombectomy 3.  Right radial artery pseudoaneurysm repair 4.  Negative pressure wound VAC placement right forearm wound  Surgeon: Dr. Marty Heck, MD  Co-surgeon: Dr. Audria Nine, MD  Indications: 82 year old male that underwent heart catheterization through a right transradial approach on 10/13/2022.  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  Vascular surgery was consulted.  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  He presents for operative intervention.  Findings: Right volar forearm fasciotomy with carpal tunnel release with Dr. Tempie Donning.  The right radial artery had a active arterial bleed from the transradial catheterization access site.  There was limited inflow so I did pass a #2 Fogarty in the radial artery arteriotomy and got more brisk inflow although no thrombus was identified.  I repaired the artery with multiple 6-0 Prolene's in interrupted fashion.  Palpable radial pulse at completion.  The forearm wound could not be completely closed and a vac was applied.  Anesthesia: General  Details: Patient was taken to the operating room after informed consent was obtained.  Placed on operative table in the supine position.  After anesthesia was induced, the right arm was prepped and draped in standard sterile fashion.  Antibiotics were given and timeout performed.  Initially started with the right volar forearm fasciotomy and carpal tunnel release that will be dictated by Dr. Tempie Donning.  In the process we did identify active arterial bleeding from the  distal radial artery at the wrist where the trans radial catheterization was performed.  I held manual pressure on this while the fasciotomies were completed with digital pressure.  Once the forearm fasciotomy and carpal tunnel was released and hematoma evacuated.  I then dissected out the radial artery and controlled this with Vesseloops proximally and distally.  There was a very weak radial pulse.  I ultimately extend the arteriotomy in the radial artery with Potts scissors under direct vision.  I passed a #2 Fogarty proximally and distally in the radial artery and got much more brisk inflow and backbleeding.  The radial artery was repaired where there was a pseudoaneurysm with 6-0 Prolene interrupted fashion with good hemostasis.  We irrigated out the arm.  The forearm was then closed with nylon sutures until there was too much tension and then we applied a vacuum dressing over the muscle with a medium black sponge.  A track pad was applied with negative pressure.  Taken to holding in stable condition.  Complication: None  Condition: Stable  Marty Heck, MD [facility] Office: [place]

## 2022-10-15 NOTE — Interval H&P Note (Signed)
History and Physical Interval Note:  10/15/2022 2:05 PM  Ralph Blackwell  has presented today for surgery, with the diagnosis of pseudoaneurysm of radial artery.  The various methods of treatment have been discussed with the patient and family. After consideration of risks, benefits and other options for treatment, the patient has consented to  Procedure(s): RIGHT FOREARM FASCIOTOMY AND PSEUDOANEURYSM REPAIR (Right) as a surgical intervention.  The patient's history has been reviewed, patient examined, no change in status, stable for surgery.  I have reviewed the patient's chart and labs.  Questions were answered to the patient's satisfaction.     Livia Tarr Lun Muro

## 2022-10-15 NOTE — Progress Notes (Signed)
ANTICOAGULATION CONSULT NOTE  Pharmacy Consult for Heparin Indication: chest pain/ACS/PE  No Known Allergies  Patient Measurements: Height: 5' 9.5" (176.5 cm) Weight: 93 kg (205 lb 0.4 oz) IBW/kg (Calculated) : 71.85 Heparin Dosing Weight: 90.8 kg  Vital Signs: Temp: 98.7 F (37.1 C) (04/03 2324) Temp Source: Oral (04/03 2324) BP: 110/67 (04/03 2300) Pulse Rate: 82 (04/03 2300)  Labs: Recent Labs    10/13/22 1935 10/14/22 0104 10/14/22 1252 10/14/22 1832 10/14/22 2236 10/15/22 0319 10/15/22 0323 10/15/22 0622 10/15/22 1226 10/15/22 1231 10/15/22 2120  HGB  --    < > 14.4   < > 13.7  --    < > 13.4  --  13.5 11.1*  HCT  --    < > 44.4   < > 41.6  --    < > 40.5  --  40.9 34.3*  PLT  --    < > 80*   < >  --   --    < > 52*  --  56* 78*  APTT  --   --   --   --   --   --   --   --  109*  --  105*  LABPROT  --   --   --   --   --   --   --   --  15.6*  --  15.8*  INR  --   --   --   --   --   --   --   --  1.3*  --  1.3*  HEPARINUNFRC  --    < > 0.53   < >  --  0.43  --  0.54  --   --  0.37  CREATININE  --    < >  --   --  1.09  --   --  1.03  --  1.15  --   TROPONINIHS 36*  --  139*  --   --   --   --  86*  --   --   --    < > = values in this interval not displayed.     Estimated Creatinine Clearance: 59.2 mL/min (by C-G formula based on SCr of 1.15 mg/dL).   Medical History: Past Medical History:  Diagnosis Date   Coronary artery disease    GERD (gastroesophageal reflux disease)    Gout    History of hiatal hernia    Hypertension    Pulmonary embolism 2008 after knee surgey   Renal insufficiency    hx renal failure 2008 after blood clots   Sleep apnea     Medications:  Medications Prior to Admission  Medication Sig Dispense Refill Last Dose   allopurinol (ZYLOPRIM) 300 MG tablet Take 150 mg by mouth daily.    10/11/2022   ascorbic acid (VITAMIN C) 500 MG tablet Take 500 mg by mouth daily.   10/11/2022   Cholecalciferol (VITAMIN D3) 5000 units CAPS  Take 2,000 Units by mouth daily with supper.    10/11/2022   finasteride (PROSCAR) 5 MG tablet Take 5 mg by mouth at bedtime.    10/11/2022   fluticasone (FLONASE) 50 MCG/ACT nasal spray Place 1 spray into both nostrils every evening.   unk   magnesium gluconate (MAGONATE) 500 MG tablet Take 500 mg by mouth daily.   10/11/2022   nitroGLYCERIN (NITROSTAT) 0.4 MG SL tablet Place 0.4 mg under the tongue every 5 (five) minutes as needed for  chest pain.   unk   Omega-3 Fatty Acids (FISH OIL) 1000 MG CAPS Take 1,000 mg by mouth daily.   10/11/2022   omeprazole (PRILOSEC) 40 MG capsule Take 1 capsule (40 mg total) by mouth daily. 90 capsule 3 10/11/2022   rosuvastatin (CRESTOR) 20 MG tablet Take 1 tablet (20 mg total) by mouth daily. 90 tablet 3 10/11/2022   telmisartan (MICARDIS) 80 MG tablet Take 40 mg by mouth daily.   10/11/2022   zinc gluconate 50 MG tablet Take 50 mg by mouth daily.   10/11/2022    Scheduled:   sodium chloride   Intravenous Once   allopurinol  150 mg Oral Daily   aspirin EC  81 mg Oral Daily   Chlorhexidine Gluconate Cloth  6 each Topical Daily   fentaNYL       finasteride  5 mg Oral QHS   irbesartan  150 mg Oral Daily   metoprolol succinate  25 mg Oral Daily   oxidized cellulose  1 each Topical Once   pantoprazole  80 mg Oral Daily   rosuvastatin  40 mg Oral Daily   sodium chloride flush  3 mL Intravenous Q12H   sodium chloride flush  3 mL Intravenous Q12H   Infusions:   sodium chloride     sodium chloride     sodium chloride Stopped (10/15/22 1235)   sodium chloride Stopped (10/15/22 1235)   heparin 1,200 Units/hr (10/15/22 2300)   nitroGLYCERIN Stopped (10/13/22 0550)   PRN:   Assessment: 53 yoM admitted with CP with clean cath. CT chest showed PE. Pt started on IV heparin. IR placed bilateral PE lysis catheters 4/2 x12h and heparin continued.  Lysis complete, heparin remains therapeutic, H/H stable, pltc down to 52k.  Goal of Therapy:  Heparin level 0.3-0.5  units/ml Monitor platelets by anticoagulation protocol: Yes  4/4 Update Heparin therapeutic at 0.37  Plan: Continue Heparin @1200units /hr Recheck heparin level with AM labs Monitor for signs/symptoms of bleeding Watch platelets closely 56>>78  Peggye Ley, Student pharmacist

## 2022-10-15 NOTE — Progress Notes (Signed)
ANTICOAGULATION CONSULT NOTE  Pharmacy Consult for Heparin Indication: chest pain/ACS  No Known Allergies  Patient Measurements: Height: 5' 9.5" (176.5 cm) Weight: 92.4 kg (203 lb 11.3 oz) IBW/kg (Calculated) : 71.85 Heparin Dosing Weight: 90.8 kg  Vital Signs: BP: 126/66 (04/03 0600) Pulse Rate: 74 (04/03 0600)  Labs: Recent Labs    10/13/22 1935 10/14/22 0104 10/14/22 1252 10/14/22 1832 10/14/22 2236 10/15/22 0319 10/15/22 0323 10/15/22 0622  HGB  --  13.8 14.4 14.2 13.7  --  13.9 13.4  HCT  --  43.1 44.4 42.3 41.6  --  41.9 40.5  PLT  --  84* 80* 67*  --   --  55* 52*  HEPARINUNFRC  --  0.45 0.53 0.36  --  0.43  --  0.54  CREATININE  --  1.04  --   --  1.09  --   --  1.03  TROPONINIHS 36*  --  139*  --   --   --   --  86*     Estimated Creatinine Clearance: 65.9 mL/min (by C-G formula based on SCr of 1.03 mg/dL).   Medical History: Past Medical History:  Diagnosis Date   Coronary artery disease    GERD (gastroesophageal reflux disease)    Gout    History of hiatal hernia    Hypertension    Pulmonary embolism 2008 after knee surgey   Renal insufficiency    hx renal failure 2008 after blood clots   Sleep apnea     Medications:  Medications Prior to Admission  Medication Sig Dispense Refill Last Dose   allopurinol (ZYLOPRIM) 300 MG tablet Take 150 mg by mouth daily.    10/11/2022   ascorbic acid (VITAMIN C) 500 MG tablet Take 500 mg by mouth daily.   10/11/2022   Cholecalciferol (VITAMIN D3) 5000 units CAPS Take 2,000 Units by mouth daily with supper.    10/11/2022   finasteride (PROSCAR) 5 MG tablet Take 5 mg by mouth at bedtime.    10/11/2022   fluticasone (FLONASE) 50 MCG/ACT nasal spray Place 1 spray into both nostrils every evening.   unk   magnesium gluconate (MAGONATE) 500 MG tablet Take 500 mg by mouth daily.   10/11/2022   nitroGLYCERIN (NITROSTAT) 0.4 MG SL tablet Place 0.4 mg under the tongue every 5 (five) minutes as needed for chest pain.   unk    Omega-3 Fatty Acids (FISH OIL) 1000 MG CAPS Take 1,000 mg by mouth daily.   10/11/2022   omeprazole (PRILOSEC) 40 MG capsule Take 1 capsule (40 mg total) by mouth daily. 90 capsule 3 10/11/2022   rosuvastatin (CRESTOR) 20 MG tablet Take 1 tablet (20 mg total) by mouth daily. 90 tablet 3 10/11/2022   telmisartan (MICARDIS) 80 MG tablet Take 40 mg by mouth daily.   10/11/2022   zinc gluconate 50 MG tablet Take 50 mg by mouth daily.   10/11/2022    Scheduled:   allopurinol  150 mg Oral Daily   aspirin EC  81 mg Oral Daily   Chlorhexidine Gluconate Cloth  6 each Topical Daily   finasteride  5 mg Oral QHS   irbesartan  150 mg Oral Daily   metoprolol succinate  25 mg Oral Daily   pantoprazole  80 mg Oral Daily   rosuvastatin  40 mg Oral Daily   sodium chloride flush  3 mL Intravenous Q12H   sodium chloride flush  3 mL Intravenous Q12H   Infusions:   sodium chloride  sodium chloride     sodium chloride 20.8 mL/hr at 10/15/22 0600   sodium chloride 20.8 mL/hr at 10/15/22 0600   heparin 1,200 Units/hr (10/15/22 0600)   nitroGLYCERIN Stopped (10/13/22 0550)   PRN:   Assessment: 89 yoM admitted with CP with clean cath. CT chest showed PE. Pt started on IV heparin. IR placed bilateral PE lysis catheters 4/2 x12h and heparin continued.  Lysis complete, heparin remains therapeutic, H/H stable, pltc down to 52k.  Goal of Therapy:  Heparin level 0.3-0.7 units/ml Monitor platelets by anticoagulation protocol: Yes   Plan:  Cont heparin 1200 units/hr Daily heparin level and CBC  Arrie Senate, PharmD, [place] [place] [facility] Clinical Pharmacist [phone] Please check AMION for all [facility] numbers 10/15/2022

## 2022-10-15 NOTE — Progress Notes (Signed)
Radiology staff arrived at bedside to measure pressures for PE lysis follow up. We tried multiple attempts to get a wave form / pressures but this could not be obtained even after switching out equipment etc. We pulled both lysis catheters out but were asked to leave both sheathes in until the patient came back from the OR per MD.

## 2022-10-15 NOTE — H&P (View-Only) (Signed)
HAND SURGERY CONSULTATION  REQUESTING PHYSICIAN: Julian Hy, DO   Chief Complaint: Right arm and hand swelling  HPI: RAMIEL PERNICIARO is a 82 y.o. male who was admitted on 4/01 for DOE and chest pain. He underwent cardiac catheterization which was unremarkable. He was subsequently found to have a saddle PE an underwent catheter directed thrombolysis. He developed significant swelling and ecchymosis of right forearm and hand. I was called to evaluate for potential compartment syndrome of hand and forearm. He denies pain in the forearm but describes a feeling of "tightness" and "puffiness." He denies numbness or paresthesias in the hand and notes that his fingers feel the same as the contralateral side.   Past Medical History:  Diagnosis Date   Coronary artery disease    GERD (gastroesophageal reflux disease)    Gout    History of hiatal hernia    Hypertension    Pulmonary embolism 2008 after knee surgey   Renal insufficiency    hx renal failure 2008 after blood clots   Sleep apnea    Past Surgical History:  Procedure Laterality Date   arthroscopic knee surgery  2004   BACK SURGERY  nov 1979 and 1998   lower   COLONOSCOPY WITH PROPOFOL N/A 10/20/2016   Procedure: COLONOSCOPY WITH PROPOFOL;  Surgeon: Garlan Fair, MD;  Location: [facility];  Service: Endoscopy;  Laterality: N/A;   IR ANGIOGRAM PULMONARY BILATERAL SELECTIVE  10/14/2022   IR ANGIOGRAM SELECTIVE EACH ADDITIONAL VESSEL  10/14/2022   IR ANGIOGRAM SELECTIVE EACH ADDITIONAL VESSEL  10/14/2022   IR INFUSION THROMBOL ARTERIAL INITIAL (MS)  10/14/2022   IR INFUSION THROMBOL ARTERIAL INITIAL (MS)  10/14/2022   IR US GUIDE VASC ACCESS RIGHT  10/14/2022   JOINT REPLACEMENT Left 2007   knee    LEFT HEART CATH AND CORONARY ANGIOGRAPHY N/A 06/30/2017   Procedure: LEFT HEART CATH AND CORONARY ANGIOGRAPHY;  Surgeon: Nigel Mormon, MD;  Location: [facility];  Service: Cardiovascular;  Laterality: N/A;   LEFT HEART CATH  AND CORONARY ANGIOGRAPHY N/A 10/13/2022   Procedure: LEFT HEART CATH AND CORONARY ANGIOGRAPHY;  Surgeon: Nigel Mormon, MD;  Location: [facility];  Service: Cardiovascular;  Laterality: N/A;   left wrist plating   2005   titanium plate   right total knee replacement Right 2009   Social History   Socioeconomic History   Marital status: Married    Spouse name: Not on file   Number of children: 2   Years of education: Not on file   Highest education level: Not on file  Occupational History   Not on file  Tobacco Use   Smoking status: Never   Smokeless tobacco: Never  Vaping Use   Vaping Use: Never used  Substance and Sexual Activity   Alcohol use: No   Drug use: No   Sexual activity: Not on file  Other Topics Concern   Not on file  Social History Narrative   Not on file   Social Determinants of Health   Financial Resource Strain: Not on file  Food Insecurity: No Food Insecurity (10/13/2022)   Hunger Vital Sign    Worried About Running Out of Food in the Last Year: Never true    Ran Out of Food in the Last Year: Never true  Transportation Needs: No Transportation Needs (10/13/2022)   PRAPARE - Hydrologist (Medical): No    Lack of Transportation (Non-Medical): No  Physical  Activity: Not on file  Stress: Not on file  Social Connections: Not on file   Family History  Problem Relation Age of Onset   Heart disease Mother    Hypertension Mother    Heart disease Father    Heart attack Father    Hypertension Father    - negative except otherwise stated in the family history section No Known Allergies Prior to Admission medications   Medication Sig Start Date End Date Taking? Authorizing Provider  allopurinol (ZYLOPRIM) 300 MG tablet Take 150 mg by mouth daily.    Yes [provider]  ascorbic acid (VITAMIN C) 500 MG tablet Take 500 mg by mouth daily.   Yes [provider]  Cholecalciferol (VITAMIN D3) 5000 units  CAPS Take 2,000 Units by mouth daily with supper.    Yes [provider]  finasteride (PROSCAR) 5 MG tablet Take 5 mg by mouth at bedtime.    Yes [provider]  fluticasone (FLONASE) 50 MCG/ACT nasal spray Place 1 spray into both nostrils every evening.   Yes [provider]  magnesium gluconate (MAGONATE) 500 MG tablet Take 500 mg by mouth daily.   Yes [provider]  nitroGLYCERIN (NITROSTAT) 0.4 MG SL tablet Place 0.4 mg under the tongue every 5 (five) minutes as needed for chest pain.   Yes [provider]  Omega-3 Fatty Acids (FISH OIL) 1000 MG CAPS Take 1,000 mg by mouth daily.   Yes [provider]  omeprazole (PRILOSEC) 40 MG capsule Take 1 capsule (40 mg total) by mouth daily. 04/21/22  Yes Patwardhan, Manish J, MD  rosuvastatin (CRESTOR) 20 MG tablet Take 1 tablet (20 mg total) by mouth daily. 06/30/22 06/25/23 Yes Patwardhan, Manish J, MD  telmisartan (MICARDIS) 80 MG tablet Take 40 mg by mouth daily.   Yes [provider]  zinc gluconate 50 MG tablet Take 50 mg by mouth daily.   Yes [provider]   VAS [place] LOWER EXTREMITY VENOUS (DVT)  Result Date: 10/14/2022  Lower Venous DVT Study Patient Name:  BEJAMIN GREENSLADE  Date of Exam:   10/14/2022 Medical Rec #: [id number]        Accession #:    [id number] Date of Birth: Aug 28, 1940        Patient Gender: M Patient Age:   50 years Exam Location:  [facility] Procedure:      VAS [place] LOWER EXTREMITY VENOUS (DVT) Referring Phys: Noe Gens --------------------------------------------------------------------------------  Indications: Pulmonary embolism.  Risk Factors: Confirmed PE. Anticoagulation: Heparin. Limitations: Bandages, line and patient positioning, patient immobility. Comparison Study: No prior studies. Performing Technologist: Oliver Hum RVT  Examination Guidelines: A complete evaluation includes B-mode imaging, spectral Doppler, color Doppler, and power  Doppler as needed of all accessible portions of each vessel. Bilateral testing is considered an integral part of a complete examination. Limited examinations for reoccurring indications may be performed as noted. The reflux portion of the exam is performed with the patient in reverse Trendelenburg.  +---------+---------------+---------+-----------+----------+--------------+ RIGHT    CompressibilityPhasicitySpontaneityPropertiesThrombus Aging +---------+---------------+---------+-----------+----------+--------------+ CFV      Full           Yes      Yes                                 +---------+---------------+---------+-----------+----------+--------------+ SFJ      Full                                                        +---------+---------------+---------+-----------+----------+--------------+  FV Prox  Full                                                        +---------+---------------+---------+-----------+----------+--------------+ FV Mid   Full                                                        +---------+---------------+---------+-----------+----------+--------------+ FV DistalFull                                                        +---------+---------------+---------+-----------+----------+--------------+ PFV      Full                                                        +---------+---------------+---------+-----------+----------+--------------+ POP      None           No       No                   Acute          +---------+---------------+---------+-----------+----------+--------------+ PTV      Full                                                        +---------+---------------+---------+-----------+----------+--------------+ PERO     Full                                                        +---------+---------------+---------+-----------+----------+--------------+ Gastroc  Partial                                       Acute          +---------+---------------+---------+-----------+----------+--------------+   +---------+---------------+---------+-----------+----------+--------------+ LEFT     CompressibilityPhasicitySpontaneityPropertiesThrombus Aging +---------+---------------+---------+-----------+----------+--------------+ CFV      Full           Yes      Yes                                 +---------+---------------+---------+-----------+----------+--------------+ SFJ      Full                                                        +---------+---------------+---------+-----------+----------+--------------+  FV Prox  Full                                                        +---------+---------------+---------+-----------+----------+--------------+ FV Mid   Full                                                        +---------+---------------+---------+-----------+----------+--------------+ FV DistalFull                                                        +---------+---------------+---------+-----------+----------+--------------+ PFV      Full                                                        +---------+---------------+---------+-----------+----------+--------------+ POP      Full           Yes      Yes                                 +---------+---------------+---------+-----------+----------+--------------+ PTV      Full                                                        +---------+---------------+---------+-----------+----------+--------------+ PERO     Full                                                        +---------+---------------+---------+-----------+----------+--------------+     Summary: RIGHT: - Findings consistent with acute deep vein thrombosis involving the right popliteal vein, and right gastrocnemius veins. - No cystic structure found in the popliteal fossa.  LEFT: - There is no evidence of deep vein thrombosis in  the lower extremity.  - No cystic structure found in the popliteal fossa.  *See table(s) above for measurements and observations. Electronically signed by Deitra Mayo MD on 10/14/2022 at 4:22:02 PM.    Final    IR US Guide Vasc Access Right  Result Date: 10/14/2022 INDICATION: Submassive saddle pulmonary embolus, right heart strain, chest discomfort despite heparin therapy EXAM: ULTRASOUND GUIDANCE FOR VASCULAR ACCESS BILATERAL PULMONARY ARTERY CATHETERIZATIONS, ANGIOGRAMS, PRESSURE MEASUREMENT, AND INFUSION CATHETER PLACEMENTS FOR PE THROMBO LYSIS COMPARISON:  None Available. MEDICATIONS: 1% lidocaine local ANESTHESIA/SEDATION: Moderate (conscious) sedation was employed during this procedure. A total of Versed 1.5 mg and Fentanyl 50 mcg was administered intravenously by the radiology nurse. Total intra-service moderate Sedation Time: The 40 minutes. The patient's level of consciousness and  vital signs were monitored continuously by radiology nursing throughout the procedure under my direct supervision. FLUOROSCOPY: Radiation Exposure Index (as provided by the fluoroscopic device): Q000111Q mGy Kerma COMPLICATIONS: None immediate. TECHNIQUE: Informed written consent was obtained from the patient after a thorough discussion of the procedural risks, benefits and alternatives. All questions were addressed. Maximal Sterile Barrier Technique was utilized including caps, mask, sterile gowns, sterile gloves, sterile drape, hand hygiene and skin antiseptic. A timeout was performed prior to the initiation of the procedure. Under sterile conditions and local anesthesia, ultrasound micropuncture access performed the right common femoral vein twice. Adjacent 6 French sheath were inserted over Bentson guidewires into the right common femoral vein. Images obtained for documentation of the patent right common femoral vein. Five [place] PA catheters were advanced into the pulmonary arteries bilaterally utilizing both Bentson and  035 glide wires. Contrast injection performed for bilateral limited pulmonary angiography. Nearly occlusive central thrombus present on the right. More peripheral nonocclusive thrombus present on the left. Central PA pressure measurement: 42/15 (25). Over bilateral Rosen guidewires, the 90 cm infusion catheters with 10 cm infusion length were advanced into the pulmonary arteries. Internal occlusion wires advanced. Images obtained for documentation of catheter positions. Access secured with a silk suture. 12 hour thrombolysis protocol will be initiated through the bilateral pulmonary artery UniFuse catheters. FINDINGS: Imaging confirms right greater than left central pulmonary emboli better demonstrated by the comparison CTA. Successful placement of bilateral 90 cm infusion catheters with 10 cm infusion leaks into both pulmonary arteries. IMPRESSION: Successful placement of bilateral pulmonary artery infusion catheters for PE thrombolysis 12 hour protocol. Electronically Signed   By: Jerilynn Mages.  Shick M.D.   On: 10/14/2022 13:10   IR INFUSION THROMBOL ARTERIAL INITIAL (MS)  Result Date: 10/14/2022 INDICATION: Submassive saddle pulmonary embolus, right heart strain, chest discomfort despite heparin therapy EXAM: ULTRASOUND GUIDANCE FOR VASCULAR ACCESS BILATERAL PULMONARY ARTERY CATHETERIZATIONS, ANGIOGRAMS, PRESSURE MEASUREMENT, AND INFUSION CATHETER PLACEMENTS FOR PE THROMBO LYSIS COMPARISON:  None Available. MEDICATIONS: 1% lidocaine local ANESTHESIA/SEDATION: Moderate (conscious) sedation was employed during this procedure. A total of Versed 1.5 mg and Fentanyl 50 mcg was administered intravenously by the radiology nurse. Total intra-service moderate Sedation Time: The 40 minutes. The patient's level of consciousness and vital signs were monitored continuously by radiology nursing throughout the procedure under my direct supervision. FLUOROSCOPY: Radiation Exposure Index (as provided by the fluoroscopic device): Q000111Q mGy  Kerma COMPLICATIONS: None immediate. TECHNIQUE: Informed written consent was obtained from the patient after a thorough discussion of the procedural risks, benefits and alternatives. All questions were addressed. Maximal Sterile Barrier Technique was utilized including caps, mask, sterile gowns, sterile gloves, sterile drape, hand hygiene and skin antiseptic. A timeout was performed prior to the initiation of the procedure. Under sterile conditions and local anesthesia, ultrasound micropuncture access performed the right common femoral vein twice. Adjacent 6 French sheath were inserted over Bentson guidewires into the right common femoral vein. Images obtained for documentation of the patent right common femoral vein. Five [place] PA catheters were advanced into the pulmonary arteries bilaterally utilizing both Bentson and 035 glide wires. Contrast injection performed for bilateral limited pulmonary angiography. Nearly occlusive central thrombus present on the right. More peripheral nonocclusive thrombus present on the left. Central PA pressure measurement: 42/15 (25). Over bilateral Rosen guidewires, the 90 cm infusion catheters with 10 cm infusion length were advanced into the pulmonary arteries. Internal occlusion wires advanced. Images obtained for documentation of catheter positions. Access secured with a silk suture. 12 hour  thrombolysis protocol will be initiated through the bilateral pulmonary artery UniFuse catheters. FINDINGS: Imaging confirms right greater than left central pulmonary emboli better demonstrated by the comparison CTA. Successful placement of bilateral 90 cm infusion catheters with 10 cm infusion leaks into both pulmonary arteries. IMPRESSION: Successful placement of bilateral pulmonary artery infusion catheters for PE thrombolysis 12 hour protocol. Electronically Signed   By: Jerilynn Mages.  Shick M.D.   On: 10/14/2022 13:10   IR INFUSION THROMBOL ARTERIAL INITIAL (MS)  Result Date:  10/14/2022 INDICATION: Submassive saddle pulmonary embolus, right heart strain, chest discomfort despite heparin therapy EXAM: ULTRASOUND GUIDANCE FOR VASCULAR ACCESS BILATERAL PULMONARY ARTERY CATHETERIZATIONS, ANGIOGRAMS, PRESSURE MEASUREMENT, AND INFUSION CATHETER PLACEMENTS FOR PE THROMBO LYSIS COMPARISON:  None Available. MEDICATIONS: 1% lidocaine local ANESTHESIA/SEDATION: Moderate (conscious) sedation was employed during this procedure. A total of Versed 1.5 mg and Fentanyl 50 mcg was administered intravenously by the radiology nurse. Total intra-service moderate Sedation Time: The 40 minutes. The patient's level of consciousness and vital signs were monitored continuously by radiology nursing throughout the procedure under my direct supervision. FLUOROSCOPY: Radiation Exposure Index (as provided by the fluoroscopic device): Q000111Q mGy Kerma COMPLICATIONS: None immediate. TECHNIQUE: Informed written consent was obtained from the patient after a thorough discussion of the procedural risks, benefits and alternatives. All questions were addressed. Maximal Sterile Barrier Technique was utilized including caps, mask, sterile gowns, sterile gloves, sterile drape, hand hygiene and skin antiseptic. A timeout was performed prior to the initiation of the procedure. Under sterile conditions and local anesthesia, ultrasound micropuncture access performed the right common femoral vein twice. Adjacent 6 French sheath were inserted over Bentson guidewires into the right common femoral vein. Images obtained for documentation of the patent right common femoral vein. Five [place] PA catheters were advanced into the pulmonary arteries bilaterally utilizing both Bentson and 035 glide wires. Contrast injection performed for bilateral limited pulmonary angiography. Nearly occlusive central thrombus present on the right. More peripheral nonocclusive thrombus present on the left. Central PA pressure measurement: 42/15 (25). Over  bilateral Rosen guidewires, the 90 cm infusion catheters with 10 cm infusion length were advanced into the pulmonary arteries. Internal occlusion wires advanced. Images obtained for documentation of catheter positions. Access secured with a silk suture. 12 hour thrombolysis protocol will be initiated through the bilateral pulmonary artery UniFuse catheters. FINDINGS: Imaging confirms right greater than left central pulmonary emboli better demonstrated by the comparison CTA. Successful placement of bilateral 90 cm infusion catheters with 10 cm infusion leaks into both pulmonary arteries. IMPRESSION: Successful placement of bilateral pulmonary artery infusion catheters for PE thrombolysis 12 hour protocol. Electronically Signed   By: Jerilynn Mages.  Shick M.D.   On: 10/14/2022 13:10   IR Angiogram Pulmonary Bilateral Selective  Result Date: 10/14/2022 INDICATION: Submassive saddle pulmonary embolus, right heart strain, chest discomfort despite heparin therapy EXAM: ULTRASOUND GUIDANCE FOR VASCULAR ACCESS BILATERAL PULMONARY ARTERY CATHETERIZATIONS, ANGIOGRAMS, PRESSURE MEASUREMENT, AND INFUSION CATHETER PLACEMENTS FOR PE THROMBO LYSIS COMPARISON:  None Available. MEDICATIONS: 1% lidocaine local ANESTHESIA/SEDATION: Moderate (conscious) sedation was employed during this procedure. A total of Versed 1.5 mg and Fentanyl 50 mcg was administered intravenously by the radiology nurse. Total intra-service moderate Sedation Time: The 40 minutes. The patient's level of consciousness and vital signs were monitored continuously by radiology nursing throughout the procedure under my direct supervision. FLUOROSCOPY: Radiation Exposure Index (as provided by the fluoroscopic device): Q000111Q mGy Kerma COMPLICATIONS: None immediate. TECHNIQUE: Informed written consent was obtained from the patient after a thorough discussion of the procedural risks,  benefits and alternatives. All questions were addressed. Maximal Sterile Barrier Technique was  utilized including caps, mask, sterile gowns, sterile gloves, sterile drape, hand hygiene and skin antiseptic. A timeout was performed prior to the initiation of the procedure. Under sterile conditions and local anesthesia, ultrasound micropuncture access performed the right common femoral vein twice. Adjacent 6 French sheath were inserted over Bentson guidewires into the right common femoral vein. Images obtained for documentation of the patent right common femoral vein. Five [place] PA catheters were advanced into the pulmonary arteries bilaterally utilizing both Bentson and 035 glide wires. Contrast injection performed for bilateral limited pulmonary angiography. Nearly occlusive central thrombus present on the right. More peripheral nonocclusive thrombus present on the left. Central PA pressure measurement: 42/15 (25). Over bilateral Rosen guidewires, the 90 cm infusion catheters with 10 cm infusion length were advanced into the pulmonary arteries. Internal occlusion wires advanced. Images obtained for documentation of catheter positions. Access secured with a silk suture. 12 hour thrombolysis protocol will be initiated through the bilateral pulmonary artery UniFuse catheters. FINDINGS: Imaging confirms right greater than left central pulmonary emboli better demonstrated by the comparison CTA. Successful placement of bilateral 90 cm infusion catheters with 10 cm infusion leaks into both pulmonary arteries. IMPRESSION: Successful placement of bilateral pulmonary artery infusion catheters for PE thrombolysis 12 hour protocol. Electronically Signed   By: Jerilynn Mages.  Shick M.D.   On: 10/14/2022 13:10   IR Angiogram Selective Each Additional Vessel  Result Date: 10/14/2022 INDICATION: Submassive saddle pulmonary embolus, right heart strain, chest discomfort despite heparin therapy EXAM: ULTRASOUND GUIDANCE FOR VASCULAR ACCESS BILATERAL PULMONARY ARTERY CATHETERIZATIONS, ANGIOGRAMS, PRESSURE MEASUREMENT, AND INFUSION  CATHETER PLACEMENTS FOR PE THROMBO LYSIS COMPARISON:  None Available. MEDICATIONS: 1% lidocaine local ANESTHESIA/SEDATION: Moderate (conscious) sedation was employed during this procedure. A total of Versed 1.5 mg and Fentanyl 50 mcg was administered intravenously by the radiology nurse. Total intra-service moderate Sedation Time: The 40 minutes. The patient's level of consciousness and vital signs were monitored continuously by radiology nursing throughout the procedure under my direct supervision. FLUOROSCOPY: Radiation Exposure Index (as provided by the fluoroscopic device): Q000111Q mGy Kerma COMPLICATIONS: None immediate. TECHNIQUE: Informed written consent was obtained from the patient after a thorough discussion of the procedural risks, benefits and alternatives. All questions were addressed. Maximal Sterile Barrier Technique was utilized including caps, mask, sterile gowns, sterile gloves, sterile drape, hand hygiene and skin antiseptic. A timeout was performed prior to the initiation of the procedure. Under sterile conditions and local anesthesia, ultrasound micropuncture access performed the right common femoral vein twice. Adjacent 6 French sheath were inserted over Bentson guidewires into the right common femoral vein. Images obtained for documentation of the patent right common femoral vein. Five [place] PA catheters were advanced into the pulmonary arteries bilaterally utilizing both Bentson and 035 glide wires. Contrast injection performed for bilateral limited pulmonary angiography. Nearly occlusive central thrombus present on the right. More peripheral nonocclusive thrombus present on the left. Central PA pressure measurement: 42/15 (25). Over bilateral Rosen guidewires, the 90 cm infusion catheters with 10 cm infusion length were advanced into the pulmonary arteries. Internal occlusion wires advanced. Images obtained for documentation of catheter positions. Access secured with a silk suture. 12 hour  thrombolysis protocol will be initiated through the bilateral pulmonary artery UniFuse catheters. FINDINGS: Imaging confirms right greater than left central pulmonary emboli better demonstrated by the comparison CTA. Successful placement of bilateral 90 cm infusion catheters with 10 cm infusion leaks into both pulmonary arteries. IMPRESSION: Successful  placement of bilateral pulmonary artery infusion catheters for PE thrombolysis 12 hour protocol. Electronically Signed   By: Jerilynn Mages.  Shick M.D.   On: 10/14/2022 13:10   IR Angiogram Selective Each Additional Vessel  Result Date: 10/14/2022 INDICATION: Submassive saddle pulmonary embolus, right heart strain, chest discomfort despite heparin therapy EXAM: ULTRASOUND GUIDANCE FOR VASCULAR ACCESS BILATERAL PULMONARY ARTERY CATHETERIZATIONS, ANGIOGRAMS, PRESSURE MEASUREMENT, AND INFUSION CATHETER PLACEMENTS FOR PE THROMBO LYSIS COMPARISON:  None Available. MEDICATIONS: 1% lidocaine local ANESTHESIA/SEDATION: Moderate (conscious) sedation was employed during this procedure. A total of Versed 1.5 mg and Fentanyl 50 mcg was administered intravenously by the radiology nurse. Total intra-service moderate Sedation Time: The 40 minutes. The patient's level of consciousness and vital signs were monitored continuously by radiology nursing throughout the procedure under my direct supervision. FLUOROSCOPY: Radiation Exposure Index (as provided by the fluoroscopic device): [id number] mGy Kerma COMPLICATIONS: None immediate. TECHNIQUE: Informed written consent was obtained from the patient after a thorough discussion of the procedural risks, benefits and alternatives. All questions were addressed. Maximal Sterile Barrier Technique was utilized including caps, mask, sterile gowns, sterile gloves, sterile drape, hand hygiene and skin antiseptic. A timeout was performed prior to the initiation of the procedure. Under sterile conditions and local anesthesia, ultrasound micropuncture access  performed the right common femoral vein twice. Adjacent 6 French sheath were inserted over Bentson guidewires into the right common femoral vein. Images obtained for documentation of the patent right common femoral vein. Five [place] PA catheters were advanced into the pulmonary arteries bilaterally utilizing both Bentson and 035 glide wires. Contrast injection performed for bilateral limited pulmonary angiography. Nearly occlusive central thrombus present on the right. More peripheral nonocclusive thrombus present on the left. Central PA pressure measurement: 42/15 (25). Over bilateral Rosen guidewires, the 90 cm infusion catheters with 10 cm infusion length were advanced into the pulmonary arteries. Internal occlusion wires advanced. Images obtained for documentation of catheter positions. Access secured with a silk suture. 12 hour thrombolysis protocol will be initiated through the bilateral pulmonary artery UniFuse catheters. FINDINGS: Imaging confirms right greater than left central pulmonary emboli better demonstrated by the comparison CTA. Successful placement of bilateral 90 cm infusion catheters with 10 cm infusion leaks into both pulmonary arteries. IMPRESSION: Successful placement of bilateral pulmonary artery infusion catheters for PE thrombolysis 12 hour protocol. Electronically Signed   By: Jerilynn Mages.  Shick M.D.   On: 10/14/2022 13:10   CT Angio Chest Pulmonary Embolism (PE) W or WO Contrast  Addendum Date: 10/13/2022   ADDENDUM REPORT: 10/13/2022 15:59 ADDENDUM: Critical Value/emergent results were called by telephone at the time of interpretation on 10/13/2022 at 3:59 pm to provider [facility] , who verbally acknowledged these results. Electronically Signed   By: Lajean Manes M.D.   On: 10/13/2022 15:59   Result Date: 10/13/2022 CLINICAL DATA:  Unstable angina. Inpatient. Clinical suspicion for pulmonary embolism. EXAM: CT ANGIOGRAPHY CHEST WITH CONTRAST TECHNIQUE: Multidetector CT imaging of the chest  was performed using the standard protocol during bolus administration of intravenous contrast. Multiplanar CT image reconstructions and MIPs were obtained to evaluate the vascular anatomy. RADIATION DOSE REDUCTION: This exam was performed according to the departmental dose-optimization program which includes automated exposure control, adjustment of the mA and/or kV according to patient size and/or use of iterative reconstruction technique. CONTRAST:  23mL OMNIPAQUE IOHEXOL 350 MG/ML SOLN COMPARISON:  01/25/2013.  Chest radiograph, 10/11/2022. FINDINGS: Cardiovascular:  Pulmonary arteries are well opacified. There are bilateral pulmonary emboli, greater on the right. A saddle embolus spans  the right and left pulmonary arteries. Near occlusive emboli extend into the right lower lobe segmental pulmonary arteries. Nonocclusive emboli are noted in the right middle and upper lobe central pulmonary arteries. On the left nonocclusive pulmonary embolism extends from the saddle embolism into the central lingular branch pulmonary artery. RV LV ratio is 1.18. Heart normal in size. No pericardial effusion. Great vessels are normal in caliber. Mild aortic atherosclerosis. No dissection. Mediastinum/Nodes: Significant enlargement of the left thyroid lobe that extends substernally. This represents a notable change from the chest CT dated 01/25/2013. No other mediastinal masses. No hilar masses. No mediastinal or hilar adenopathy. Trachea and esophagus are unremarkable. Lungs/Pleura: Minor basilar subsegmental atelectasis. Lungs otherwise clear. No pleural effusion or pneumothorax. Upper Abdomen: No acute finding. Musculoskeletal: No fracture or acute finding. No bone lesion. No chest wall mass. Review of the MIP images confirms the above findings. IMPRESSION: 1. Positive for pulmonary emboli. Right greater than left pulmonary emboli with a saddle pulmonary embolus as detailed. Positive for acute PE with CT evidence of right heart  strain (RV/LV Ratio = 1.18) consistent with at least submassive (intermediate risk) PE. The presence of right heart strain has been associated with an increased risk of morbidity and mortality. Please refer to the "Code PE Focused" order set in EPIC. 2. No other acute finding. 3. Significant enlargement of the left thyroid lobe that extends substernally. Recommend thyroid ultrasound (ref: J Am Coll Radiol. 2015 Feb;12(2): 143-50). Aortic Atherosclerosis (ICD10-I70.0). Electronically Signed: By: Lajean Manes M.D. On: 10/13/2022 15:50   - Positive ROS: All other systems have been reviewed and were otherwise negative with the exception of those mentioned in the HPI and as above.  Physical Exam: General: No acute distress, resting comfortably Cardiovascular: BUE warm and well perfused, normal rate Respiratory: Normal WOB on RA Skin: Warm and dry Neurologic: Sensation intact distally Psychiatric: Patient is at baseline mood and affect  Right Upper Extremity  Significant swelling from elbow into hand.  Ecchymosis of volar distal forearm with mild overlying bullae.  Mildly TTP in forearm with compartments still soft and compressible.  Significant and diffuse hand swelling.  Non TTP over hand compartments with obvious swelling and puffiness but no compartment tension.  He is able to flex and extend the fingers but is limited by swelling.  No pain w/ passive or active flexion and extension of the fingers.  SILT in m/u/r distribution in hand and in all fingers that is symmetric to the contralateral side.  The hand is warm and well perfused.   Assessment: 82 yo M s/p cardiac catheterization and subsequent catheter directed thrombolysis for saddle pulmonary embolism.  He has significant right forearm and hand swelling but no concern for acute compartment syndrome at this time given minimal pain, no compartment tension, no pain on passive stretch of the fingers and wrist, and no pain w/ AROM of fingers. He has  normal sensation on exam without subjective numbness or paresthesias.   Plan: - Recommend strict and diligent elevation of the RUE to help with swelling - Recommend moving blood pressure cuff to contralateral limb.  Discussed this with patient's nurse - Had a very thorough discussion with both patient and his wife regarding the nature of compartment syndrome including the relevant anatomy of the forearm and hand compartments, the mechanism of compartment syndrome, the concerning signs and symptoms, and the necessary steps in treatment - Patient will monitor his symptoms for change  Thank you for the consult and the opportunity to  see Mr. Rosealee Albee, M.D. [facility] 11:30 AM

## 2022-10-15 NOTE — Progress Notes (Addendum)
NAME:  JOSEPHE FORRESTAL, MRN:  [id number], DOB:  07/14/1941, LOS: 3 ADMISSION DATE:  10/11/2022, CONSULTATION DATE:  10/13/22 REFERRING MD:  Dr. Doristine Bosworth, CHIEF COMPLAINT:  SOB, Chest Pain   History of Present Illness:  82 year old man who presented to [facility] ED 3/30 with progressive SOB over several weeks.  The patient reports they traveled approximately 3 weeks ago to the [place].  While on vacation he walked 2 miles per day.  He typically is normally very active.  He notes when he returned home he was taking his dog out to go to to the bathroom and he developed increasing shortness of breath and chest tightness.  He describes it as a "vice on his chest". He states he did not feel like he was good to be able to make it back into the house.  He attempted sublingual nitro and he did have some improvement in symptoms.  Noting chest pain radiated to both arms.  He was evaluated by Cardiology on presentation and was thought to have unstable angina.  In route with EMS the patient was reportedly very diaphoretic and short of breath.  He was started on nitro and heparin infusion.  He continued to have chest pain with shortness of breath with minimal activity such as brushing teeth.  He was taken for left heart cath on 4/1 with findings of no angiographically evident significant coronary artery disease.  No acute findings to suggest cause of patient's chest pain.  Prior echocardiogram performed approximately a week before admission showed an RVSP of 28 mmHg.  Repeat ECHO this admission showed RVSP of 69 mmHg.  The patient subsequently had a CT angio of the chest which was positive for saddle PE with right greater than left pulmonary emboli, noted CT evidence of right heart strain with RV LV ratio of 1.18 consistent with at least submassive PE.  Patient was evaluated by interventional radiology.  PCCM consulted for evaluation   Pertinent Medical History:  CAD HTN Pulmonary embolism-2008 after knee surgery,  subsequent fall with wrist fracture OSA Renal insufficiency GERD Gout Hiatal hernia  Significant Hospital Events: Including procedures, antibiotic start and stop dates in addition to other pertinent events   3/30 admit with SOB, chest pain 4/1 LHC with no angiographically evident coronary artery disease to suggest cause of chest pain.  Noted elevated RVSP on echo. CTA completed & positive for saddle PE. IR/PCCM consulted.  4/2 hemodynamically stable, though had an episode of CP this morning 4/3 Acute onset of RUE hand/wrist/forearm swelling, c/f compartment syndrome. VVS/Hand Surgery consulted. Plan for OR for fasciotomy.  Interim History / Subjective:  Severe swelling/bruising of RUE (hand, wrist, forearm) this AM Significant ecchymosis and bullae formation; ice/elevation VVS/Hand Surgery consulted for ?compartment syndrome, advised to monitor Early afternoon, patient had loss of doppler-able signals in hand/R wrist, loss of pleth on SpO2 monitor and increased pain with passive ROM of fingers Dr. Stanford Breed (VVS) reevaluated with recommendation for OR STAT labs sent including CBC/BMP, coags, T&S 1U Plt ordered pre-OR IR consulted for R groin access site evaluation/dressing change, Surgicel placed Hgb stable, Plt low 52 O2 requirements stable 3LNC  Objective:  Blood pressure 126/66, pulse 74, temperature 98.1 F (36.7 C), temperature source Oral, resp. rate 16, height 5' 9.5" (1.765 m), weight 92.4 kg, SpO2 95 %.        Intake/Output Summary (Last 24 hours) at 10/15/2022 0835 Last data filed at 10/15/2022 0600 Gross per 24 hour  Intake 496.48 ml  Output 950 ml  Net -453.52 ml    Filed Weights   10/11/22 1821 10/14/22 0300  Weight: 93 kg 92.4 kg   Physical Examination: General: Acutely ill-appearing elderly man in NAD. Pleasant and conversant. HEENT: [place]/AT, anicteric sclera, PERRL, moist mucous membranes. [place] in place. Neuro: Awake, oriented x 4. Responds to verbal stimuli.  Following commands consistently. Moves all 4 extremities spontaneously. Strength 5/5 in BLE, 5/5 in LUE, 4/5 in RUE due to pain/swelling. Sensation/motor intact RUE (hand, wrist). CV: RRR, no m/g/r. PULM: Breathing even and unlabored on 3LNC. Lung fields CTAB. GI: Soft, nontender, nondistended. Hypoactive bowel sounds. Extremities: No significant LE edema noted. R groin IR access site with venous sheaths in place, copious bloody/oozing under dressing with BRB; Femstop and Surgicel placed for hemostasis. RUE swelling/bruising as detailed; +pain with passive ROM. Skin: Warm/dry, +edema and +ecchymosis of RUE (hand/wrist, tracking to forearm.  Resolved Hospital Problem List:     Assessment & Plan:   Acute Submassive Pulmonary Embolism  Chest Pain / Dyspnea secondary to Above  Hx Prior PE (2008, provoked after surgery) Submassive PE/saddle on imaging, has fortunately been on heparin infusion since admission for possible ACS. Remains intermittently symptomatic with chest pressure, lightheadedness and shortness of breath. Prior RVSP ~28, currently 69.  RV/LV ratio 1.18.  T wave inversion in lead III.  - Fortunately, remains hemodynamically stable this AM 4/3 - Heparin, tPA held in the setting of oozing at R groin access site and worsening RUE edema/ecchymosis c/f compartment syndrome - IR continues to follow for venous sheath removal, when appropriate - Supplemental O2 sats for SpO2 > 90% - Pulmonary hygiene  Compartment syndrome of RUE (hand, wrist), ?r/t R radial access site for cath R radial artery pseudoaneurysm Acute onset of swelling, ecchymosis, pain of R hand/R wrist and forearm 4/3AM; VVS and Hand Surgery consulted for evaluation. Initial plan for monitoring but worsened early PM 4/3 prompting reconsult after pulses not doppler-able, increased passive ROM of hand and loss of pleth on SpO2 probe. - VVS/Hand Surgery evaluated, appreciate assistance - RUE [place] with distal R radial artery  pseudoaneurysm - Plan for OR 4/3PM for fasciotomy of RUE - STAT repeat labs including CBC/BMP/Mg/coags/T&S/TEG, f/u - 1U Plt to be transfused prior to OR - Frequent dopplers/pulse checks until OR intervention  Chronic thrombocytopenia  - Trend Plt, 52 today 4/3 - Monitor for signs of active bleeding (oozing at R groin, ?R hand compartment syndrome) - Transfuse 1U Plt in the setting of pending OR  CAD  Mixed Hyperlipidemia  S/p LHC with no significant disease burden. - Continue statin - Continue Toprol XL, irbesartan as BP tolerates  GERD  - PPI  Best Practice: (right click and "Reselect all SmartList Selections" daily)  Diet/type: NPO DVT prophylaxis: systemic heparin GI prophylaxis: PPI Lines: N/A Foley:  N/A Code Status:  full code Last date of multidisciplinary goals of care discussion: full code, family updated at the bedside 4/3  Critical care time: 39 minutes   The patient is critically ill with multiple organ system failure and requires high complexity decision making for assessment and support, frequent evaluation and titration of therapies, advanced monitoring, review of radiographic studies and interpretation of complex data.   Critical Care Time devoted to patient care services, exclusive of separately billable procedures, described in this note is 39 minutes.  Lestine Mount, PA-C [facility] 10/15/22 8:35 AM  Please see [email or web address] for pager details.  From 7A-7P if no response, please call [phone] After hours, please  call ELink [phone]

## 2022-10-15 NOTE — Transfer of Care (Signed)
Immediate Anesthesia Transfer of Care Note  Patient: Ralph Blackwell  Procedure(s) Performed: RIGHT FOREARM FASCIOTOMY, RADIAL ARTERY REPAIR, AND RADIAL ARTERY THROMBECOTOMY (Right: Arm Lower)  Patient Location: PACU  Anesthesia Type:General  Level of Consciousness: awake, drowsy, and patient cooperative  Airway & Oxygen Therapy: Patient Spontanous Breathing and Patient connected to nasal cannula oxygen  Post-op Assessment: Report given to RN and Post -op Vital signs reviewed and stable  Post vital signs: Reviewed and stable  Last Vitals:  Vitals Value Taken Time  BP    Temp    Pulse    Resp    SpO2      Last Pain:  Vitals:   10/15/22 1417  TempSrc: Oral  PainSc:       Patients Stated Pain Goal: 0 ([id number])  Complications: No notable events documented.

## 2022-10-15 NOTE — Progress Notes (Addendum)
Patient presented over the weekend with chest pain and shortness of breath, minimally elevated troponin. I was asked to do coronary angiography given concern for unstable angina.  Patient underwent coronary angiography including microvascular dysfunction evaluation (given that epicardial coronaries were unremarkable), with 10,000 U heparin. given concern for unstable angina. TR band came off without any hematoma on 4/1 morning.  Coronaries were clean but I noticed his RV was remarkable larger with higher RVSP compared to echocardiogram just a week ago. His story was very concerning for PE to me. Therefore, we got CTA chest that showed saddle PE. Patient underwent IR guided thrombectomy and 12 hrs of lytics. 4/2 midnight, he started developing swelling in right forearm.   This morning, his hand and forearm is swollen and bruised with grade IV hematoma and skin blebs. Radial and ulnar pulses intact. He has severe restriction of his right hand finger movements.  He likely had dislodgement of healed Rt radial arteriotomy site. I am concerned about impending compartment syndrome. I discussed with Dr. Stanford Breed who will see the patient. Appreciate his help.   Nigel Mormon, MD Pager: [phone] Office: [phone]

## 2022-10-15 NOTE — Consult Note (Signed)
[facility] [place]  ASSESSMENT / PLAN: 82 y.o. male with right wrist and forearm hematoma with significant swelling and skin bullae.  Recent transradial catheterization for concern for coronary artery disease and unstable angina.  He was ultimately found to have a saddle pulmonary embolus underwent catheter directed thrombolysis.  He also has significant thrombocytopenia with a platelet count of 52.  Will plan to check an arterial duplex to evaluate for possible pseudoaneurysm.  I have spoken to Dr. Tempie Donning who will evaluate him as well.   CHIEF COMPLAINT: Right hand swelling after transradial cath; cath directed thrombolysis of saddle pulmonary embolus; significant thrombocytopenia.   HISTORY OF PRESENT ILLNESS: Ralph Blackwell is a 82 y.o. male admitted to the hospital with significant dyspnea on exertion and chest pain with walking.  This improved with nitroglycerin therapy.  Pain radiated to both arms.  Evaluation in the emergency room was concerning for unstable angina.  He was taken for cardiac catheterization on 10/13/2022.  Cardiac catheterization was unremarkable.  Further workup was initiated.  He was found to have saddle pulmonary embolus.  He was recommended to undergo catheter directed thrombolysis, which began 10/14/2022 this was stopped last night.  He developed significant swelling in the right forearm.  I was called this morning to evaluate.  On my evaluation, the patient reports some pain in the right hand, wrist, and forearm.  He is able to move his fingers.  He can feel me touching his hand.  He is retired.  He is an avid Psychologist, occupational in [employer].  Past Medical History:  Diagnosis Date   Coronary artery disease    GERD (gastroesophageal reflux disease)    Gout    History of hiatal hernia    Hypertension    Pulmonary embolism 2008 after knee surgey   Renal insufficiency    hx renal failure 2008 after blood clots   Sleep apnea     Past Surgical  History:  Procedure Laterality Date   arthroscopic knee surgery  2004   BACK SURGERY  nov 1979 and 1998   lower   COLONOSCOPY WITH PROPOFOL N/A 10/20/2016   Procedure: COLONOSCOPY WITH PROPOFOL;  Surgeon: Garlan Fair, MD;  Location: [facility];  Service: Endoscopy;  Laterality: N/A;   IR ANGIOGRAM PULMONARY BILATERAL SELECTIVE  10/14/2022   IR ANGIOGRAM SELECTIVE EACH ADDITIONAL VESSEL  10/14/2022   IR ANGIOGRAM SELECTIVE EACH ADDITIONAL VESSEL  10/14/2022   IR INFUSION THROMBOL ARTERIAL INITIAL (MS)  10/14/2022   IR INFUSION THROMBOL ARTERIAL INITIAL (MS)  10/14/2022   IR US GUIDE VASC ACCESS RIGHT  10/14/2022   JOINT REPLACEMENT Left 2007   knee    LEFT HEART CATH AND CORONARY ANGIOGRAPHY N/A 06/30/2017   Procedure: LEFT HEART CATH AND CORONARY ANGIOGRAPHY;  Surgeon: Nigel Mormon, MD;  Location: [facility];  Service: Cardiovascular;  Laterality: N/A;   LEFT HEART CATH AND CORONARY ANGIOGRAPHY N/A 10/13/2022   Procedure: LEFT HEART CATH AND CORONARY ANGIOGRAPHY;  Surgeon: Nigel Mormon, MD;  Location: [facility];  Service: Cardiovascular;  Laterality: N/A;   left wrist plating   2005   titanium plate   right total knee replacement Right 2009    Family History  Problem Relation Age of Onset   Heart disease Mother    Hypertension Mother    Heart disease Father    Heart attack Father    Hypertension Father     Social History   Socioeconomic  History   Marital status: Married    Spouse name: Not on file   Number of children: 2   Years of education: Not on file   Highest education level: Not on file  Occupational History   Not on file  Tobacco Use   Smoking status: Never   Smokeless tobacco: Never  Vaping Use   Vaping Use: Never used  Substance and Sexual Activity   Alcohol use: No   Drug use: No   Sexual activity: Not on file  Other Topics Concern   Not on file  Social History Narrative   Not on file   Social Determinants of Health    Financial Resource Strain: Not on file  Food Insecurity: No Food Insecurity (10/13/2022)   Hunger Vital Sign    Worried About Running Out of Food in the Last Year: Never true    Ran Out of Food in the Last Year: Never true  Transportation Needs: No Transportation Needs (10/13/2022)   PRAPARE - Hydrologist (Medical): No    Lack of Transportation (Non-Medical): No  Physical Activity: Not on file  Stress: Not on file  Social Connections: Not on file  Intimate Partner Violence: Not At Risk (10/13/2022)   Humiliation, Afraid, Rape, and Kick questionnaire    Fear of Current or Ex-Partner: No    Emotionally Abused: No    Physically Abused: No    Sexually Abused: No    No Known Allergies  Current Facility-Administered Medications  Medication Dose Route Frequency Provider Last Rate Last Admin   0.9 %  sodium chloride infusion  250 mL Intravenous PRN Patwardhan, Manish J, MD       0.9 %  sodium chloride infusion  250 mL Intravenous PRN Greggory Keen, MD       0.9 %  sodium chloride infusion   Intravenous Continuous Greggory Keen, MD 20.8 mL/hr at 10/15/22 0600 Infusion Verify at 10/15/22 0600   0.9 %  sodium chloride infusion   Intravenous Continuous Greggory Keen, MD 20.8 mL/hr at 10/15/22 0600 Infusion Verify at 10/15/22 0600   acetaminophen (TYLENOL) tablet 650 mg  650 mg Oral Q4H PRN Etta Quill, DO   650 mg at 10/14/22 2153   allopurinol (ZYLOPRIM) tablet 150 mg  150 mg Oral Daily Emilee Hero, MD   150 mg at 10/12/22 1511   aspirin EC tablet 81 mg  81 mg Oral Daily Custovic, Sabina, DO   81 mg at 10/12/22 0957   Chlorhexidine Gluconate Cloth 2 % PADS 6 each  6 each Topical Daily Candee Furbish, MD   6 each at 10/14/22 1000   finasteride (PROSCAR) tablet 5 mg  5 mg Oral QHS Emilee Hero, MD   5 mg at 10/14/22 2153   heparin ADULT infusion 100 units/mL (25000 units/216mL)  1,200 Units/hr Intravenous Continuous Horton, Kristie M, DO 12 mL/hr at  10/15/22 0600 1,200 Units/hr at 10/15/22 0600   irbesartan (AVAPRO) tablet 150 mg  150 mg Oral Daily Custovic, Sabina, DO       metoprolol succinate (TOPROL-XL) 24 hr tablet 25 mg  25 mg Oral Daily Custovic, Sabina, DO   25 mg at 10/12/22 1511   nitroGLYCERIN 50 mg in dextrose 5 % 250 mL (0.2 mg/mL) infusion  0-200 mcg/min Intravenous Titrated Custovic, Sabina, DO   Stopped at 10/13/22 0550   ondansetron (ZOFRAN) injection 4 mg  4 mg Intravenous Q6H PRN Etta Quill, DO   4 mg at 10/14/22  1655   Oral care mouth rinse  15 mL Mouth Rinse PRN Emilee Hero, MD       pantoprazole (PROTONIX) EC tablet 80 mg  80 mg Oral Daily Emilee Hero, MD   80 mg at 10/13/22 1636   rosuvastatin (CRESTOR) tablet 40 mg  40 mg Oral Daily Dorrell, Herbie Baltimore, MD   40 mg at 10/14/22 2153   sodium chloride flush (NS) 0.9 % injection 3 mL  3 mL Intravenous Q12H Patwardhan, Manish J, MD   3 mL at 10/14/22 2200   sodium chloride flush (NS) 0.9 % injection 3 mL  3 mL Intravenous PRN Patwardhan, Manish J, MD       sodium chloride flush (NS) 0.9 % injection 3 mL  3 mL Intravenous Q12H Greggory Keen, MD   3 mL at 10/14/22 2159   sodium chloride flush (NS) 0.9 % injection 3 mL  3 mL Intravenous PRN Greggory Keen, MD        PHYSICAL EXAM Vitals:   10/15/22 0515 10/15/22 0530 10/15/22 0545 10/15/22 0600  BP: (!) 124/58 121/67 115/62 126/66  Pulse: 66 71 64 74  Resp: 15 17 12 16   Temp:      TempSrc:      SpO2: 95% 96% 95% 95%  Weight:      Height:       Well-appearing man in no acute distress Regular rate and rhythm Unlabored breathing Significant swelling in the right upper extremity from the elbow to the fingers.  Edema is greater than 2+.  He has significant bruising on the volar aspect of the wrist.  Brisk Doppler flow noted in the radial, ulnar, and palmar arch arteries.  He reports normal sensation in the fingers of the hand.  PERTINENT LABORATORY AND RADIOLOGIC DATA  Most recent CBC    Latest Ref Rng &  Units 10/15/2022    6:22 AM 10/15/2022    3:23 AM 10/14/2022   10:36 PM  CBC  WBC 4.0 - 10.5 K/uL 8.3  8.2    Hemoglobin 13.0 - 17.0 g/dL 13.4  13.9  13.7   Hematocrit 39.0 - 52.0 % 40.5  41.9  41.6   Platelets 150 - 400 K/uL 52  55       Most recent CMP    Latest Ref Rng & Units 10/15/2022    6:22 AM 10/14/2022   10:36 PM 10/14/2022    1:04 AM  CMP  Glucose 70 - 99 mg/dL 117  153  124   BUN 8 - 23 mg/dL 18  20  17    Creatinine 0.61 - 1.24 mg/dL 1.03  1.09  1.04   Sodium 135 - 145 mmol/L 135  133  137   Potassium 3.5 - 5.1 mmol/L 4.4  4.4  4.5   Chloride 98 - 111 mmol/L 103  101  104   CO2 22 - 32 mmol/L 22  21  24    Calcium 8.9 - 10.3 mg/dL 8.6  8.7  9.1    Victoria Euceda N. Stanford Breed, MD FACS [facility] Phone Number: [phone] 10/15/2022 8:37 AM   Total time spent on preparing this encounter including chart review, data review, collecting history, examining the patient, coordinating care for this new patient, 60 minutes.  Portions of this report may have been transcribed using voice recognition software.  Every effort has been made to ensure accuracy; however, inadvertent computerized transcription errors may still be present.

## 2022-10-15 NOTE — Progress Notes (Signed)
Hand with large hematoma, unreadable pleth, pain on passive finger extension. D/w Dr. Stanford Breed who re-examined at bedside. Planning for urgent surgery to decompress his hand due to progression since this morning with compartment syndrome. Verified that all TPA is discontinued. Requested IR to wait until after OR to remove venous catheters due to difficulty in closely monitoring this site once he is draped in the OR.  Type and screen, platelets ordered in prep for OR.  Julian Hy, DO 10/15/22 12:57 PM [facility]  For contact information, see Amion. If no response to pager, please call PCCM consult pager. After hours, 7PM- 7AM, please call Elink.

## 2022-10-15 NOTE — Op Note (Addendum)
Date of Surgery: 10/15/2022  INDICATIONS: Patient is a 82 y.o.-year-old male with right forearm compartment syndrome following radial artery access for heart catheterization and coronary angiography on 10/13/22.  He was subsequently found to have a saddle pulmonary embolism that was treated with catheter directed TPA.  He developed right forearm and hand swelling this morning.  I was contacted by vascular surgery this morning and asked to evaluate his hand and forearm for compartment syndrome.  The hand and forearm were both soft and compressible at that time with normal sensation and no pain with passive stretch.  He did have significant volar ecchymosis and mild bullae formation at that time.  Several hours later the patient developed significantly worsening pain, forearm swelling, pain with passive stretch of the fingers, and diminished sensation in the hand.  The compartments of the hand remained soft and non tender but with moderate subcutaneous edema.  The decision was made to proceed with emergent right forearm fasciotomy with repair of the radial artery injury.  Risks of the surgery including bleeding, infection, damage to neurovascular structures, persistent numbness, persistent swelling, permanent hand dysfunction, need for soft tissue coverage or other procedure, loss of limb. The patient wishes to proceed with surgery.  Informed consent was signed after our discussion.   PREOPERATIVE DIAGNOSIS:  Right forearm compartment syndrome Traumatic right radial arteriotomy  POSTOPERATIVE DIAGNOSIS: Same.  PROCEDURE:  Right volar forearm fasciotomy [id number]) Right carpal tunnel release [id number])   SURGEON: Audria Nine, M.D. CO-SURGEON: Monica Martinez, MD   ANESTHESIA:  general  IV FLUIDS AND URINE: See anesthesia.  ESTIMATED BLOOD LOSS: 50 mL.  IMPLANTS: * No implants in log *   DRAINS: None  COMPLICATIONS: None  DESCRIPTION OF PROCEDURE: The patient was met in the preoperative  holding area where the surgical site was marked and the consent form was signed.  The patient was then taken to the operating room and transferred to the operating table.  All bony prominences were well padded.  A tourniquet was applied to the right upper arm.  General endotracheal anesthesia was induced.  The operative extremity was prepped and draped in the usual and sterile fashion.  A formal time-out was performed to confirm that this was the correct patient, surgery, side, and site.   Following formal timeout, a curvilinear incision was designed at the volar aspect of the forearm.  The proximal limb starting around the central portion of the [place] fossa.  This curved around to the radial aspect of the proximal mid forearm before curving back towards the midline of the forearm.  Skin was incised.  Small crossing vessels were coagulated with bipolar cautery.  The superficial forearm fascia was identified.  This was sharply incised over the flexor carpi radialis muscle belly and tendon.  The radial artery was identified radial to the flexor carpi radialis tendon.  The radial artery was dissected free by Dr. Carlis Abbott.  There was a small arteriotomy at the radial aspect of the vessel in the distal third of the forearm.  The repair of this arteriotomy will be dictated by Dr. Carlis Abbott.  There was significant coagulated blood within the superficial compartment consistent with his arterial injury.  The brachioradialis tendon was identified radially.  The fascia overlying the brachioradialis and mobile wad compartment was opened under direct visualization.  The median nerve was identified radial to the flexor carpi radialis.  The nerve was found to be intact and in continuity.  Following release of the superficial volar compartment, the flexor  digitorum superficialis muscle bellies were retracted radially and the flexor carpi ulnaris retracted ulnarly.  The ulnar artery and nerve were identified in this interval.  They were  found to be intact without injury.  Radial to the ulnar neurovascular bundle was the flexor digitorum profundus muscle belly.  The fascia overlying the profundus muscle belly was sharply incised using a Metzenbaum scissor under direct visualization to release the deep volar compartment.  There was no blood or hematoma within the deep compartment.  Evaluation of the volar forearm showed that the superficial and deep compartments were both completely released.  The muscles were all beefy red and contractile with electrocautery stimulation. Then turned my attention to the carpal tunnel.  A longitudinal incision was drawn over the carpal tunnel in line with the radial border of the fourth ray.  The skin and subcutaneous fat were incised.  The longitudinally running palmar fascia was identified and was incised in line with its fibers.  Retractors were placed in the wound and the transverse carpal ligament identified.  The transverse carpal ligament was identified and the carpal tunnel was entered.  There was significant swelling within the carpal tunnel with hematoma from the volar forearm.  Retractors were placed in the proximal aspect of the carpal tunnel wound and the distal antebrachial fascia was identified.  This was sharply divided under direct visualization with a tenotomy scissor.  I then examined the hand further.  The thenar, hypothenar, palmar, and dorsal aspects of the hand were quite edematous but very soft without tense compartments.  The patient had no pain with palpation of the hand compartments preoperatively.  I therefore decided not to proceed with hand compartment releases as I did not feel that he had compartment syndrome in the hand.  I then evaluate the dorsal aspect of the forearm.  With the volar compartments completely released, the dorsal aspect of the forearm was completely soft and compressible without concern for dorsal compartment syndrome.  At this point, the wound was thoroughly  irrigated with copious sterile saline.  The wound was found to be hemostatic.  A portion of the proximal wound was closed using a 0 and 2-0 nylon suture.  A very small portion of the distal incision was closed to allow for coverage of the radial artery.  The carpal tunnel incision was closed using a 4-0 nylon suture.  A wound VAC sponge was then trimmed into the shape of the remaining wound.  The VAC sponge was placed and secured with adhesive.  The carpal tunnel incision was dressed with Xeroform, folded Kerlix.  The VAC tubing was connected to the machine and there was found to be a good seal without evidence of leak.  The arm was then dressed with loose cast padding and Ace wrap.  The patient was reversed from anesthesia and extubated uneventfully.  They were transferred from the operating table to the postoperative bed.  All counts were correct x 2 at the end of the procedure.  The patient was then taken to the PACU in stable condition.   POSTOPERATIVE PLAN: Patient will return to his floor on the cardiac unit.  Recommend keeping the extremity elevated to help with swelling of the forearm and hand.  Will keep the wound VAC in place until Friday at which point he will return to the operating room for repeat irrigation and potential closure of more of the wound if possible.  Audria Nine, MD 4:37 PM

## 2022-10-15 NOTE — Progress Notes (Signed)
RUE arterial duplex (pseudo check) has been completed.  Preliminary results given to Dr. Stanford Breed via [place].    Results can be found under chart review under CV PROC. 10/15/2022 11:44 AM Loreto Loescher RVT, RDMS

## 2022-10-15 NOTE — Progress Notes (Signed)
Patient has had deterioration of clinical exam over the morning. He now has pain with extension of fingers. His compartment is more tight. His doppler flow is worse. Duplex shows pseudoaneurysm of radial artery.  Plan to go to OR for decompression and repair of radial artery.  Yevonne Aline. Stanford Breed, MD [facility] Phone Number: [phone] 10/15/2022 12:37 PM

## 2022-10-15 NOTE — Addendum Note (Signed)
Addendum  created 10/15/22 1706 by Annye Asa, MD   Clinical Note Signed

## 2022-10-15 NOTE — Progress Notes (Signed)
Regarding the hematoma which formed the night of 4/2 into 4/3   Patient called me into the room around 2130 to let me know that his right hand was tight and hurting. Upon inspection a hematoma was present which had not been present at my first assessment at approximately 1930. Manual pressure was held for approximately 10 minutes followed by another 10 minutes with a manual blood pressure cuff. [facility] cardiology fellow Dr. Chancy Milroy was paged and after returning a call was notified that the patient had a new hematoma and was told what had been done up to that point. The fellow asked for an H and H to see if the hemoglobin level had dropped due to the bleeding which came back at an acceptable level. The hematoma seemed to have stopped growing so I left to check on my other patient and returned about 5 to 10 minutes later to re-assess. Upon reassessment the hematoma seemed to have grown slightly so manual pressure was held again for 5 additional minutes and the manual blood pressure cuff was used for another 5 minutes. Again the bleeding seemed to have stopped. I continued to recheck the site about every 15 minutes. Around 2300 I noticed the hematoma had grown again. Manual pressure was held for 10 minutes and the cardiology fellow was paged again. After 15 minutes without a response from the fellow I paged them again. No response. I continued to assess the hematoma and hold pressure intermittently. One more time between approximately 2330 and 0030 I paged the cardiology fellow again with no answer. I spoke with CCM Dr. Erskine Emery to let him know about the situation and see if I could have someone from the cath lab come place a TR band, as manual pressure did not seem to be working. Cath lab was in a case that was to be ending soon, so I continued to assess and hold manual pressure intermittently until they arrived. When they arrived to the unit a nurse from the cath lab informed me he did not believe that a tr band would  be enough and I should put a manual blood pressure cuff on the hematoma and leave it in place for at least an hour. We then looked together to see who did the catheterization procedure, to find that [facility] did the procedure. I paged [facility] Dr. Einar Gip to have them come assess the arm. I received no response. Approximately 30 minutes later I again paged [facility] with no answer. Approximately an hour an a half after placing the manual blood pressure cuff I removed it to assess the arm. It seemed the bleeding had stopped so I again switched to assessing regularly. Around 0600 the patient told me that his arm was hurting and upon assessment the hematoma was very tight so I replaced the blood pressure cuff until shift change.   Threasa Beards, RN

## 2022-10-15 NOTE — Anesthesia Postprocedure Evaluation (Signed)
Anesthesia Post Note  Patient: SAMWISE WEHRI  Procedure(s) Performed: RIGHT FOREARM FASCIOTOMY, RADIAL ARTERY REPAIR, AND RADIAL ARTERY THROMBECOTOMY (Right: Arm Lower)     Patient location during evaluation: PACU Anesthesia Type: General Level of consciousness: patient cooperative, oriented and sedated Pain management: pain level controlled Vital Signs Assessment: post-procedure vital signs reviewed and stable Respiratory status: spontaneous breathing, nonlabored ventilation, respiratory function stable and patient connected to nasal cannula oxygen Cardiovascular status: blood pressure returned to baseline and stable Postop Assessment: no apparent nausea or vomiting Anesthetic complications: no   No notable events documented.  Last Vitals:  Vitals:   10/15/22 1641 10/15/22 1645  BP: (!) 159/76 (!) 178/82  Pulse: 79 80  Resp: 17 16  Temp:  37.3 C  SpO2: 90% 100%    Last Pain:  Vitals:   10/15/22 1645  TempSrc:   PainSc: 0-No pain                 Jaequan Propes,E. Marwah Disbro

## 2022-10-15 NOTE — Anesthesia Procedure Notes (Signed)
Procedure Name: Intubation Date/Time: 10/15/2022 2:54 PM  Performed by: Annye Asa, MDPre-anesthesia Checklist: Patient identified, Emergency Drugs available, Suction available and Patient being monitored Patient Re-evaluated:Patient Re-evaluated prior to induction Oxygen Delivery Method: Circle system utilized Preoxygenation: Pre-oxygenation with 100% oxygen Induction Type: IV induction Laryngoscope Size: Mac and 4 Grade View: Grade II Tube type: Oral Tube size: 7.5 mm Number of attempts: 1 Airway Equipment and Method: Stylet Placement Confirmation: ETT inserted through vocal cords under direct vision, positive ETCO2 and breath sounds checked- equal and bilateral Secured at: 23 cm Tube secured with: Tape Dental Injury: Teeth and Oropharynx as per pre-operative assessment  Comments: RSI -- did not attempt to mask ventilate

## 2022-10-15 NOTE — Progress Notes (Signed)
Patient seen and re-examined.  Condition has acutely worsened.  Hand is now cool to touch with numbness and paresthesias.  Volar forearm is tense and swollen with significant TTP.  Hand remains edematous but seems more like subcutaneous edema rather than hand compartment syndrome.  No TTP at thenar or hypothenar eminences.  Dorsal forearm remains much softer than volar without TTP.  Had very thorough discussion with patient and his wife regarding the nature of compartment syndrome and need for urgent surgery.  All questions were an  Will plan on emergent forearm fasciotomy as soon as possible.  We are working on securing an operating room for this emergent surgery.    Sherilyn Cooter, M.D. [facility]

## 2022-10-15 NOTE — Progress Notes (Signed)
ANTICOAGULATION CONSULT NOTE - Follow Up Consult  Pharmacy Consult for heparin Indication: pulmonary embolus  Labs: Recent Labs    10/13/22 1748 10/13/22 1935 10/14/22 0104 10/14/22 1252 10/14/22 1832 10/14/22 2236 10/15/22 0319 10/15/22 0323  HGB  --   --  13.8 14.4 14.2 13.7  --  13.9  HCT  --   --  43.1 44.4 42.3 41.6  --  41.9  PLT  --   --  84* 80* 67*  --   --  55*  HEPARINUNFRC  --   --  0.45 0.53 0.36  --  0.43  --   CREATININE  --   --  1.04  --   --  1.09  --   --   TROPONINIHS 33* 36*  --  139*  --   --   --   --     Assessment/Plan:  82yo male remains therapeutic on heparin. Will continue infusion at current rate of 1200 units/hr and continue to monitor.   Wynona Neat, PharmD, BCPS  10/15/2022,6:13 AM

## 2022-10-15 NOTE — Progress Notes (Signed)
Back in the ICU, wound vac with BRB draining. Compressive dressing in place.   Surgery ok with heparin restarting tonight> discussed post-op. Had been restarted in PACU.  Resume diet Oxycodone, fentanyl PRN for pain if tylenol does not cover him. Has antiemetics PRN if needed.  Back to OR Friday to close per Dr Carlis Abbott.   Julian Hy, DO 10/15/22 6:38 PM [facility]

## 2022-10-15 NOTE — Progress Notes (Signed)
ANTICOAGULATION CONSULT NOTE  Pharmacy Consult for Heparin Indication: chest pain/ACS  No Known Allergies  Patient Measurements: Height: 5' 9.5" (176.5 cm) Weight: 93 kg (205 lb 0.4 oz) IBW/kg (Calculated) : 71.85 Heparin Dosing Weight: 90.8 kg  Vital Signs: Temp: 99.1 F (37.3 C) (04/03 1645) Temp Source: Oral (04/03 1417) BP: 125/71 (04/03 1844) Pulse Rate: 82 (04/03 1844)  Labs: Recent Labs    10/13/22 1935 10/14/22 0104 10/14/22 1252 10/14/22 1832 10/14/22 2236 10/15/22 0319 10/15/22 0323 10/15/22 0622 10/15/22 1226 10/15/22 1231  HGB  --    < > 14.4 14.2 13.7  --  13.9 13.4  --  13.5  HCT  --    < > 44.4 42.3 41.6  --  41.9 40.5  --  40.9  PLT  --    < > 80* 67*  --   --  55* 52*  --  56*  APTT  --   --   --   --   --   --   --   --  109*  --   LABPROT  --   --   --   --   --   --   --   --  15.6*  --   INR  --   --   --   --   --   --   --   --  1.3*  --   HEPARINUNFRC  --    < > 0.53 0.36  --  0.43  --  0.54  --   --   CREATININE  --    < >  --   --  1.09  --   --  1.03  --  1.15  TROPONINIHS 36*  --  139*  --   --   --   --  86*  --   --    < > = values in this interval not displayed.     Estimated Creatinine Clearance: 59.2 mL/min (by C-G formula based on SCr of 1.15 mg/dL).   Assessment: 82 yoM admitted with CP with clean cath. CT chest showed PE. Pt started on IV heparin. IR placed bilateral PE lysis catheters 4/2 x12h and heparin continued.  Pt with compartment syndrome and taken to OR for fasciotomy. Heparin was only briefly paused in OR from [phone]. Vascular okay with restart in PE patient.  Noted pt with wound vac with BRB draining. Plan back to OR Friday to close.  Goal of Therapy:  Heparin level 0.3-0.7 units/ml (consider 0.3-0.5 with recent fasciotomy) Monitor platelets by anticoagulation protocol: Yes   Plan:  Continue heparin 1200 units/hr (restarted in PACU) Will f/u 8 hr heparin level past restart Daily heparin level and  CBC  Sherlon Handing, PharmD, BCPS Please see amion for complete clinical pharmacist phone list 10/15/2022 7:05 PM

## 2022-10-15 NOTE — Anesthesia Preprocedure Evaluation (Addendum)
Anesthesia Evaluation  Patient identified by MRN, date of birth, ID band Patient awake    Reviewed: Allergy & Precautions, NPO status , Patient's Chart, lab work & pertinent test results  History of Anesthesia Complications Negative for: history of anesthetic complications  Airway Mallampati: II  TM Distance: >3 FB Neck ROM: Full    Dental  (+) Dental Advisory Given   Pulmonary PE (saddle pulmonary embolus underwent catheter directed thrombolysis)   breath sounds clear to auscultation       Cardiovascular hypertension, pulmonary hypertension Rhythm:Regular Rate:Normal  10/13/2022 cath: LM: Normal LAD: Minimal luminal irregularities Lcx: Large, dominant. Normal RCA: Small, nondominant.  Normal  LVEDP normal    No angiographically evident significant coronary artery disease  10/12/2022 ECHO: 1. EF 65 to 70%. The LV has normal function. The left ventricle has no regional wall motion abnormalities. Left ventricular diastolic parameters are  consistent with Grade I diastolic dysfunction (impaired relaxation).   2. Right ventricular systolic function is normal. The right ventricular  size is normal. There is severely elevated pulmonary artery systolic  pressure. The estimated right ventricular systolic pressure is 123456 mmHg.   3. The mitral valve is grossly normal. Mild mitral valve regurgitation.  No evidence of mitral stenosis.   4. The tricuspid valve is abnormal. Tricuspid valve regurgitation is  moderate.   5. The aortic valve is tricuspid. There is mild calcification of the  aortic valve. Aortic valve regurgitation is mild. No aortic stenosis     Neuro/Psych negative neurological ROS     GI/Hepatic Neg liver ROS,GERD  Medicated and Controlled,,  Endo/Other  negative endocrine ROS    Renal/GU Renal InsufficiencyRenal disease     Musculoskeletal   Abdominal   Peds  Hematology Hb 13.5, plt 56k: plts  infusing heparin   Anesthesia Other Findings   Reproductive/Obstetrics                             Anesthesia Physical Anesthesia Plan  ASA: 4 and emergent  Anesthesia Plan: General   Post-op Pain Management:    Induction: Intravenous  PONV Risk Score and Plan: 2 and Ondansetron and Dexamethasone  Airway Management Planned: Oral ETT  Additional Equipment: None  Intra-op Plan:   Post-operative Plan: Possible Post-op intubation/ventilation  Informed Consent: I have reviewed the patients History and Physical, chart, labs and discussed the procedure including the risks, benefits and alternatives for the proposed anesthesia with the patient or authorized representative who has indicated his/her understanding and acceptance.     Dental advisory given  Plan Discussed with: CRNA and Surgeon  Anesthesia Plan Comments:         Anesthesia Quick Evaluation

## 2022-10-15 NOTE — Progress Notes (Addendum)
Subjective:  Right forearm pain and swelling   Current Facility-Administered Medications:    0.9 %  sodium chloride infusion, 250 mL, Intravenous, PRN, Jonnathan Birman J, MD   0.9 %  sodium chloride infusion, 250 mL, Intravenous, PRN, Greggory Keen, MD   0.9 %  sodium chloride infusion, , Intravenous, Continuous, Greggory Keen, MD, Last Rate: 20.8 mL/hr at 10/15/22 0600, Infusion Verify at 10/15/22 0600   0.9 %  sodium chloride infusion, , Intravenous, Continuous, Greggory Keen, MD, Last Rate: 20.8 mL/hr at 10/15/22 0600, Infusion Verify at 10/15/22 0600   acetaminophen (TYLENOL) tablet 650 mg, 650 mg, Oral, Q4H PRN, Etta Quill, DO, 650 mg at 10/14/22 2153   allopurinol (ZYLOPRIM) tablet 150 mg, 150 mg, Oral, Daily, Dorrell, Robert, MD, 150 mg at 10/12/22 1511   aspirin EC tablet 81 mg, 81 mg, Oral, Daily, Custovic, Sabina, DO, 81 mg at 10/12/22 [id number]   Chlorhexidine Gluconate Cloth 2 % PADS 6 each, 6 each, Topical, Daily, Candee Furbish, MD, 6 each at 10/14/22 1000   finasteride (PROSCAR) tablet 5 mg, 5 mg, Oral, QHS, Dorrell, Robert, MD, 5 mg at 10/14/22 2153   heparin ADULT infusion 100 units/mL (25000 units/26mL), 1,200 Units/hr, Intravenous, Continuous, Horton, Kristie M, DO, Last Rate: 12 mL/hr at 10/15/22 0600, 1,200 Units/hr at 10/15/22 0600   irbesartan (AVAPRO) tablet 150 mg, 150 mg, Oral, Daily, Custovic, Sabina, DO   metoprolol succinate (TOPROL-XL) 24 hr tablet 25 mg, 25 mg, Oral, Daily, Custovic, Sabina, DO, 25 mg at 10/12/22 1511   nitroGLYCERIN 50 mg in dextrose 5 % 250 mL (0.2 mg/mL) infusion, 0-200 mcg/min, Intravenous, Titrated, Custovic, Sabina, DO, Stopped at 10/13/22 0550   ondansetron (ZOFRAN) injection 4 mg, 4 mg, Intravenous, Q6H PRN, Etta Quill, DO, 4 mg at 10/14/22 1655   Oral care mouth rinse, 15 mL, Mouth Rinse, PRN, Dorrell, Robert, MD   pantoprazole (PROTONIX) EC tablet 80 mg, 80 mg, Oral, Daily, Dorrell, Robert, MD, 80 mg at 10/13/22 1636    rosuvastatin (CRESTOR) tablet 40 mg, 40 mg, Oral, Daily, Dorrell, Robert, MD, 40 mg at 10/14/22 2153   sodium chloride flush (NS) 0.9 % injection 3 mL, 3 mL, Intravenous, Q12H, Jarelyn Bambach J, MD, 3 mL at 10/14/22 2200   sodium chloride flush (NS) 0.9 % injection 3 mL, 3 mL, Intravenous, PRN, Taijah Macrae J, MD   sodium chloride flush (NS) 0.9 % injection 3 mL, 3 mL, Intravenous, Q12H, Shick, Michael, MD, 3 mL at 10/14/22 2159   sodium chloride flush (NS) 0.9 % injection 3 mL, 3 mL, Intravenous, PRN, Greggory Keen, MD   Objective:  Vital Signs in the last 24 hours: Pulse Rate:  [54-116] 75 (04/03 0900) Resp:  [10-25] 18 (04/03 0900) BP: (91-135)/(54-110) 113/64 (04/03 0900) SpO2:  [86 %-98 %] 95 % (04/03 0900)  Intake/Output from previous day: 04/02 0701 - 04/03 0700 In: 508.5 [I.V.:508.5] Out: 1200 [Urine:1200]  Physical Exam Vitals and nursing note reviewed.  Constitutional:      General: He is not in acute distress. Neck:     Vascular: No JVD.  Cardiovascular:     Rate and Rhythm: Normal rate and regular rhythm.     Heart sounds: Normal heart sounds. No murmur heard.    Comments: Rt forearm grade IV hematoma Superficial skin blebs and ecchymosis Intact radial and ulnar pulses Pulmonary:     Effort: Pulmonary effort is normal.     Breath sounds: Normal breath sounds. No wheezing or rales.  Musculoskeletal:  Right lower leg: No edema.     Left lower leg: No edema.      Imaging/tests reviewed and independently interpreted:  Coronary angiogram 10/13/2022: LM: Normal LAD: Minimal luminal irregularities Lcx: Large, dominant. Normal RCA: Small, nondominant.  Normal   LVEDP normal   Resting Pd/Pa: 0.93 FFR: 0.88 CFR: 4.5 IMR: 27 (only marginally abnormal)   No angiographically evident significant coronary artery disease Nearly normal microvascular indicis   Above findings do not explain the severity of patient's chest pain shortness of breath.   I  personally reviewed and compared echocardiograms.  Echocardiogram performed outpatient last week showed RVSP of 28 mmHg.  RVSP was noted to be 69 mmHg on inpatient echocardiogram yesterday.  Patient reportedly has history of PEs in the past.   Given significant only different RVSP, patient's profound symptoms not explained by coronary or cardiac etiology, I strongly suspect acute pulmonary cause such as pulmonary embolism.  I personally discussed this with patient and patient's wife, as well as hospitalist Dr. Parks Ranger.  We will proceed with CT angiogram chest, with additional hydration.  If CT angiogram does not reveal acute PE, I will perform right heart catheterization tomorrow.  CTA chest 10/13/2022: 1. Positive for pulmonary emboli. Right greater than left pulmonary emboli with a saddle pulmonary embolus as detailed. Positive for acute PE with CT evidence of right heart strain (RV/LV Ratio = 1.18) consistent with at least submassive (intermediate risk) PE. The presence of right heart strain has been associated with an increased risk of morbidity and mortality. Please refer to the "Code PE Focused" order set in EPIC. 2. No other acute finding. 3. Significant enlargement of the left thyroid lobe that extends substernally. Recommend thyroid ultrasound (ref: J Am Coll Radiol. 2015 Feb;12(2): 143-50).   Aortic Atherosclerosis (ICD10-I70.0).  Cardiac Studies:  Telemetry 10/15/2022: No significant arrhythmia  EKG 10/14/2022: Sinus rhythm with occasional Premature ventricular complexes and Premature atrial complexes Left axis deviation Abnormal ECG  Echocardiogram 10/12/2022:  1. Left ventricular ejection fraction, by estimation, is 65 to 70%. The  left ventricle has normal function. The left ventricle has no regional  wall motion abnormalities. Left ventricular diastolic parameters are  consistent with Grade I diastolic  dysfunction (impaired relaxation).   2. Right ventricular systolic  function is normal. The right ventricular  size is normal. There is severely elevated pulmonary artery systolic  pressure. The estimated right ventricular systolic pressure is 123456 mmHg.   3. The mitral valve is grossly normal. Mild mitral valve regurgitation.  No evidence of mitral stenosis.   4. The tricuspid valve is abnormal. Tricuspid valve regurgitation is  moderate.   5. The aortic valve is tricuspid. There is mild calcification of the  aortic valve. Aortic valve regurgitation is mild. No aortic stenosis is  present.   6. There is Moderate (Grade III) plaque involving the descending aorta.   7. The inferior vena cava is normal in size with <50% respiratory  variability, suggesting right atrial pressure of 8 mmHg.   Comparison(s): No prior Echocardiogram.    Assessment & Recommendations:  82 year old Caucasian male with hypertension, hyperlipidemia, nonobstructive coronary artery disease, acute PE  Patient presented over the weekend with chest pain and shortness of breath, minimally elevated troponin. I was asked to do coronary angiography given concern for unstable angina.   Patient underwent coronary angiography including microvascular dysfunction evaluation (given that epicardial coronaries were unremarkable), with 10,000 U heparin. given concern for unstable angina. TR band came off without any hematoma on  4/1 morning.   Coronaries were clean but I noticed his RV was remarkable larger with higher RVSP compared to echocardiogram just a week ago. His story was very concerning for PE to me. Therefore, we got CTA chest that showed saddle PE. Patient underwent IR guided thrombectomy and 12 hrs of lytics. 4/2 midnight, he started developing swelling in right forearm.    This morning, his hand and forearm is swollen and bruised with grade IV hematoma and skin blebs. Radial and ulnar pulses intact. He has severe restriction of his right hand finger movements.   He likely had  dislodgement of healed Rt radial arteriotomy site. Thrombocytopenia with platelets 50k did not help either. I am concerned about impending compartment syndrome. I discussed with Dr. Stanford Breed who will see the patient. Appreciate his help.   Discussed interpretation of tests and management recommendations with the primary team   Nigel Mormon, MD Pager: [phone] Office: [phone]

## 2022-10-15 NOTE — Progress Notes (Signed)
Upon arrival at start of shift, previous shift nurse Threasa Beards RN showed this nurse the hematoma that had formed overnight. Immediately informed Dr Carlis Abbott who told this nurse to page Cardiology. Paged Cardiology at [facility], Dr Einar Gip. Cardiology arrived at 0800 to assess hematoma which had ceased swelling at that point. Right arm had +2 pulses to radius. Cardiology called Vascular who came to assess at [phone]. Orders to elevate arm and apply ice pack to arm and carried out. Orthopedics arrived at [phone] to assess patient. Radial pulse and O2 saturation monitored closely with O2 probe kept on right thumb to monitor pleth. Continued to monitor hand and arm with no further swelling. At 1200 patient had +2 radial pulses and good pleth on O2 sat. At approximately 1205 pleth on O2 probe went out while I was watching the monitor. Immediately went in to reassess, Dr Carlis Abbott was in room and noticed as well. Changed O2 probe to verify accuracy, and checked radial pulse. Noticed hand was cooler and dusky, checked pulses and could not palpate pulses so checked with a doppler. Unable to find pulses with a doppler so this nurse paged Cardiology and Vascular again. Cardiology paged at 1207 along with Vascular. Cardiology returned page promptly and came to reassess and called Vascular himself. Vascular did not return this nurse's page. New orders placed by [place] Crane at 1250 and this nurse carried those orders out. Patient taken to OR by this nurse at 1415 and handoff given to OR nurse.

## 2022-10-16 ENCOUNTER — Encounter (HOSPITAL_COMMUNITY): Payer: Self-pay | Admitting: Orthopedic Surgery

## 2022-10-16 DIAGNOSIS — M79A11 Nontraumatic compartment syndrome of right upper extremity: Secondary | ICD-10-CM

## 2022-10-16 DIAGNOSIS — D696 Thrombocytopenia, unspecified: Secondary | ICD-10-CM | POA: Diagnosis not present

## 2022-10-16 DIAGNOSIS — I2602 Saddle embolus of pulmonary artery with acute cor pulmonale: Secondary | ICD-10-CM | POA: Diagnosis not present

## 2022-10-16 LAB — BPAM PLATELET PHERESIS
Blood Product Expiration Date: 202404042359
ISSUE DATE / TIME: 202404031302
Unit Type and Rh: 5100

## 2022-10-16 LAB — BASIC METABOLIC PANEL
Anion gap: 6 (ref 5–15)
BUN: 25 mg/dL — ABNORMAL HIGH (ref 8–23)
CO2: 22 mmol/L (ref 22–32)
Calcium: 8.5 mg/dL — ABNORMAL LOW (ref 8.9–10.3)
Chloride: 103 mmol/L (ref 98–111)
Creatinine, Ser: 1.05 mg/dL (ref 0.61–1.24)
GFR, Estimated: >60 mL/min (ref >60)
Glucose, Bld: 183 mg/dL — ABNORMAL HIGH (ref 70–99)
Potassium: 4.6 mmol/L (ref 3.5–5.1)
Sodium: 131 mmol/L — ABNORMAL LOW (ref 135–145)

## 2022-10-16 LAB — PREPARE PLATELET PHERESIS: Unit division: 0

## 2022-10-16 LAB — HEPARIN LEVEL (UNFRACTIONATED)
Heparin Unfractionated: 0.55 IU/mL (ref 0.30–0.70)
Heparin Unfractionated: 0.41 IU/mL (ref 0.30–0.70)

## 2022-10-16 LAB — CBC
HCT: 34 % — ABNORMAL LOW (ref 39.0–52.0)
Hemoglobin: 11.2 g/dL — ABNORMAL LOW (ref 13.0–17.0)
MCH: 30 pg (ref 26.0–34.0)
MCHC: 32.9 g/dL (ref 30.0–36.0)
MCV: 91.2 fL (ref 80.0–100.0)
Platelets: 79 10*3/uL — ABNORMAL LOW (ref 150–400)
RBC: 3.73 MIL/uL — ABNORMAL LOW (ref 4.22–5.81)
RDW: 14.1 % (ref 11.5–15.5)
WBC: 8 10*3/uL (ref 4.0–10.5)
nRBC: 0 % (ref 0.0–0.2)

## 2022-10-16 LAB — GLUCOSE, CAPILLARY
Glucose-Capillary: 166 mg/dL — ABNORMAL HIGH (ref 70–99)
Glucose-Capillary: 168 mg/dL — ABNORMAL HIGH (ref 70–99)
Glucose-Capillary: 171 mg/dL — ABNORMAL HIGH (ref 70–99)
Glucose-Capillary: 199 mg/dL — ABNORMAL HIGH (ref 70–99)

## 2022-10-16 LAB — PHOSPHORUS: Phosphorus: 4.5 mg/dL (ref 2.5–4.6)

## 2022-10-16 LAB — MAGNESIUM: Magnesium: 2.1 mg/dL (ref 1.7–2.4)

## 2022-10-16 MED ORDER — SODIUM CHLORIDE 0.9% IV SOLUTION: Freq: Once | INTRAVENOUS | Status: AC

## 2022-10-16 MED ORDER — INSULIN ASPART 100 UNIT/ML IJ SOLN
1.0000 [IU] | INTRAMUSCULAR | Status: DC
  Administered 2022-10-16 – 2022-10-17 (×5): 2 [IU] via SUBCUTANEOUS
  Administered 2022-10-17: 1 [IU] via SUBCUTANEOUS
  Administered 2022-10-17 (×2): 2 [IU] via SUBCUTANEOUS
  Administered 2022-10-17: 1 [IU] via SUBCUTANEOUS
  Administered 2022-10-18 (×2): 2 [IU] via SUBCUTANEOUS
  Administered 2022-10-18: 3 [IU] via SUBCUTANEOUS
  Administered 2022-10-18: 2 [IU] via SUBCUTANEOUS
  Administered 2022-10-18: 1 [IU] via SUBCUTANEOUS
  Administered 2022-10-19: 2 [IU] via SUBCUTANEOUS
  Administered 2022-10-19: 1 [IU] via SUBCUTANEOUS
  Administered 2022-10-19: 2 [IU] via SUBCUTANEOUS
  Administered 2022-10-19 (×2): 1 [IU] via SUBCUTANEOUS

## 2022-10-16 MED ORDER — FENTANYL CITRATE PF 50 MCG/ML IJ SOSY
50.0000 ug | PREFILLED_SYRINGE | Freq: Once | INTRAMUSCULAR | Status: AC
  Administered 2022-10-16: 50 ug via INTRAVENOUS
  Filled 2022-10-16: qty 1

## 2022-10-16 MED ORDER — CEFAZOLIN SODIUM-DEXTROSE 2-4 GM/100ML-% IV SOLN
2.0000 g | INTRAVENOUS | Status: AC
  Filled 2022-10-16: qty 100

## 2022-10-16 NOTE — Progress Notes (Signed)
This afternoon he walked almost 3 laps and felt good. Has eaten well this afternoon. His hand is feeling significantly improved after his wound vac drained a large amount when he first walked today. His hand is less swollen and he now has palpable pulses again per RN. Planning for OR tomorrow morning. NPO past midnight. Wife updated at bedside this evening during afternoon rounds.   Julian Hy, DO 10/16/22 7:45 PM [facility]  For contact information, see Amion. If no response to pager, please call PCCM consult pager. After hours, 7PM- 7AM, please call [facility].

## 2022-10-16 NOTE — Progress Notes (Signed)
[facility] for Heparin Indication: chest pain/ACS  No Known Allergies  Patient Measurements: Height: 5' 9.5" (176.5 cm) Weight: 93 kg (205 lb 0.4 oz) IBW/kg (Calculated) : 71.85 Heparin Dosing Weight: 90.8 kg  Vital Signs: Temp: 98.6 F (37 C) (04/04 0400) Temp Source: Oral (04/04 0400) BP: 105/77 (04/04 0815) Pulse Rate: 94 (04/04 0815)  Labs: Recent Labs    10/13/22 1935 10/14/22 0104 10/14/22 1252 10/14/22 1832 10/15/22 0622 10/15/22 1226 10/15/22 1231 10/15/22 2120 10/16/22 0140  HGB  --    < > 14.4   < > 13.4  --  13.5 11.1* 11.2*  HCT  --    < > 44.4   < > 40.5  --  40.9 34.3* 34.0*  PLT  --    < > 80*   < > 52*  --  56* 78* 79*  APTT  --   --   --   --   --  109*  --  105*  --   LABPROT  --   --   --   --   --  15.6*  --  15.8*  --   INR  --   --   --   --   --  1.3*  --  1.3*  --   HEPARINUNFRC  --    < > 0.53   < > 0.54  --   --  0.37 0.55  CREATININE  --    < >  --    < > 1.03  --  1.15  --  1.05  TROPONINIHS 36*  --  139*  --  86*  --   --   --   --    < > = values in this interval not displayed.     Estimated Creatinine Clearance: 64.8 mL/min (by C-G formula based on SCr of 1.05 mg/dL).   Assessment: 3 yoM admitted with CP with clean cath. CT chest showed PE. Pt started on IV heparin. IR placed bilateral PE lysis catheters 4/2 x12h and heparin continued. Pt with compartment syndrome and taken to OR for fasciotomy 4/3, heparin resumed postop.  Heparin level slightly above goal at 0.55 this morning, H/H stable and pltc improved to 79k. Heparin paused briefly for bleeding in hand. Will continue to target lower end of therapeutic range for now with recent fasciotomy.  Goal of Therapy:  Heparin level 0.3-0.5 units/ml  Monitor platelets by anticoagulation protocol: Yes   Plan:  Reduce heparin to 1000 units/h Recheck heparin level in 8h  Arrie Senate, PharmD, [place], [facility] Clinical Pharmacist [phone] Please  check AMION for all [facility] Pharmacy numbers 10/16/2022

## 2022-10-16 NOTE — Progress Notes (Signed)
This RN palpated patient's right radial pulse at 0830. Radial pulse was 2+ at this time. At approximately [phone] patient called out for RN stating that he felt like arm was swelling and having increased pain. This RN to bedside. No pulse palpable.This RN was able to doppler radial pulse. MD Ernest Mallick, MD Sherilyn Cooter, MD Monica Martinez notified. All physicians to bedside. No new orders at this time.

## 2022-10-16 NOTE — Progress Notes (Signed)
ANTICOAGULATION CONSULT NOTE  Pharmacy Consult for Heparin Indication: chest pain/ACS  No Known Allergies  Patient Measurements: Height: 5' 9.5" (176.5 cm) Weight: 93 kg (205 lb 0.4 oz) IBW/kg (Calculated) : 71.85 Heparin Dosing Weight: 90.8 kg  Vital Signs: Temp: 97.8 F (36.6 C) (04/04 1242) Temp Source: Oral (04/04 1242) BP: 109/62 (04/04 1800) Pulse Rate: 91 (04/04 1800)  Labs: Recent Labs    10/13/22 1935 10/14/22 0104 10/14/22 1252 10/14/22 1832 10/15/22 0622 10/15/22 1226 10/15/22 1231 10/15/22 2120 10/16/22 0140 10/16/22 1801  HGB  --    < > 14.4   < > 13.4  --  13.5 11.1* 11.2*  --   HCT  --    < > 44.4   < > 40.5  --  40.9 34.3* 34.0*  --   PLT  --    < > 80*   < > 52*  --  56* 78* 79*  --   APTT  --   --   --   --   --  109*  --  105*  --   --   LABPROT  --   --   --   --   --  15.6*  --  15.8*  --   --   INR  --   --   --   --   --  1.3*  --  1.3*  --   --   HEPARINUNFRC  --    < > 0.53   < > 0.54  --   --  0.37 0.55 0.41  CREATININE  --    < >  --    < > 1.03  --  1.15  --  1.05  --   TROPONINIHS 36*  --  139*  --  86*  --   --   --   --   --    < > = values in this interval not displayed.     Estimated Creatinine Clearance: 64.8 mL/min (by C-G formula based on SCr of 1.05 mg/dL).   Assessment: 33 yoM admitted with CP with clean cath. CT chest showed PE. Pt started on IV heparin. IR placed bilateral PE lysis catheters 4/2 x12h and heparin continued. Pt with compartment syndrome and taken to OR for fasciotomy 4/3, heparin resumed postop.  Heparin level 0.41 units/mL (therapeutic) on heparin 1000 units/hr.   Goal of Therapy:  Heparin level 0.3-0.5 units/ml  Monitor platelets by anticoagulation protocol: Yes   Plan:  Continue heparin 1000 units/hr Check level with AM labs  Erskine Speed, PharmD Clinical Pharmacist Please check AMION for all [facility] Pharmacy numbers 10/16/2022

## 2022-10-16 NOTE — Progress Notes (Addendum)
Progress Note    10/16/2022 6:44 AM 1 Day Post-Op  Subjective:  says his hand feels better this morning.   Tm 99.1 now afebrile  Vitals:   10/16/22 0500 10/16/22 0600  BP: 91/72 99/88  Pulse: 76 83  Resp: 14 13  Temp:    SpO2: 94% 91%    Physical Exam: General:  sitting up in bed eating breakfast. No distress Cardiac:  regular Lungs:  non labored Incisions:  right arm with bandage in place Extremities:  brisk right radial doppler signal; swelling in fingers.  Able to wiggle fingers and sensory in tact.    CBC    Component Value Date/Time   WBC 8.0 10/16/2022 0140   RBC 3.73 (L) 10/16/2022 0140   HGB 11.2 (L) 10/16/2022 0140   HCT 34.0 (L) 10/16/2022 0140   PLT 79 (L) 10/16/2022 0140   MCV 91.2 10/16/2022 0140   MCH 30.0 10/16/2022 0140   MCHC 32.9 10/16/2022 0140   RDW 14.1 10/16/2022 0140   LYMPHSABS 1.9 12/23/2007 1452   MONOABS 0.4 12/23/2007 1452   EOSABS 0.1 12/23/2007 1452   BASOSABS 0.0 12/23/2007 1452    BMET    Component Value Date/Time   NA 131 (L) 10/16/2022 0140   NA 142 02/21/2021 1014   K 4.6 10/16/2022 0140   CL 103 10/16/2022 0140   CO2 22 10/16/2022 0140   GLUCOSE 183 (H) 10/16/2022 0140   BUN 25 (H) 10/16/2022 0140   BUN 20 02/21/2021 1014   CREATININE 1.05 10/16/2022 0140   CALCIUM 8.5 (L) 10/16/2022 0140   GFRNONAA >60 10/16/2022 0140   GFRAA 78 (L) 01/25/2013 1608    INR    Component Value Date/Time   INR 1.3 (H) 10/15/2022 2120     Intake/Output Summary (Last 24 hours) at 10/16/2022 0644 Last data filed at 10/16/2022 0600 Gross per 24 hour  Intake 1959.76 ml  Output 2150 ml  Net -190.24 ml      Assessment/Plan:  82 y.o. male is s/p:  1.  Right forearm fasciotomy with carpal tunnel releases (dictated by Dr. Tempie Donning) 2.  Right radial artery thrombectomy 3.  Right radial artery pseudoaneurysm repair 4.  Negative pressure wound VAC placement right forearm wound  1 Day Post-Op   -brisk right radial doppler flow;  able to wiggle fingers this morning and sensory in tact.  He has swelling in his fingers.  Recommend elevating arm to help with this.  -thrombocytopenia improved to 79k from 56k yesterday-pt received one platelet pheresis pack yesterday. -acute blood loss anemia stable overnight -DVT prophylaxis:  heparin gtt for PE -plan to return to OR tomorrow for washout and closure of wound and replacement of wound vac.  Npo after MN/consent placed   Leontine Locket, PA-C [facility] [phone] 10/16/2022 6:44 AM  I have seen and evaluated the patient. I agree with the PA note as documented above.  Postop day 1 status post right forearm fasciotomy with a right radial artery pseudoaneurysm repair for compartment syndrome with pseudoaneurysm after transradial cath.  Brisk radial Doppler signal at the wrist.  Hand appears well-perfused.  Able to move all 5 fingers and sensation is intact.  Overall looks good.  Plan return to the OR tomorrow for [facility] change and washout of the fasciotomy with attempted closure.  I discussed unlikely to get this closed at this time given massive edema.  Please keep n.p.o. after midnight.  Consent order placed in the chart.  VAC with about 350  mL out overnight but significant forearm hematoma that is also being evacuated.  Hemoglobin today 11.2.  Marty Heck, MD [facility] Office: [phone]

## 2022-10-16 NOTE — Plan of Care (Signed)
  Problem: Education: Goal: Understanding of cardiac disease, CV risk reduction, and recovery process will improve Outcome: Progressing Goal: Individualized Educational Video(s) Outcome: Progressing   Problem: Activity: Goal: Ability to tolerate increased activity will improve Outcome: Progressing   Problem: Cardiac: Goal: Ability to achieve and maintain adequate cardiovascular perfusion will improve Outcome: Progressing   Problem: Education: Goal: Knowledge of General Education information will improve Description: Including pain rating scale, medication(s)/side effects and non-pharmacologic comfort measures Outcome: Progressing   Problem: Clinical Measurements: Goal: Ability to maintain clinical measurements within normal limits will improve Outcome: Progressing Goal: Cardiovascular complication will be avoided Outcome: Progressing   Problem: Activity: Goal: Risk for activity intolerance will decrease Outcome: Progressing   Problem: Nutrition: Goal: Adequate nutrition will be maintained Outcome: Progressing   Problem: Coping: Goal: Level of anxiety will decrease Outcome: Progressing   Problem: Elimination: Goal: Will not experience complications related to bowel motility Outcome: Progressing Goal: Will not experience complications related to urinary retention Outcome: Progressing   Problem: Pain Managment: Goal: General experience of comfort will improve Outcome: Progressing

## 2022-10-16 NOTE — Progress Notes (Signed)
Escalating hand pain, fingers feel cold to him. His pain has been escalating throughout the morning but felt ok overnight last night.   Dressing loosened. While dressing was off we were able to doppler his radial artery and the arch in his hand. The dressing was loosely applied and he had some relief of his pain. Additional dose of Fentanyl given. Surgeons updated. Son at bedside.   Julian Hy, DO 10/16/22 10:31 AM [facility]  For contact information, see Amion. If no response to pager, please call PCCM consult pager. After hours, 7PM- 7AM, please call Elink.

## 2022-10-16 NOTE — Anesthesia Preprocedure Evaluation (Addendum)
Anesthesia Evaluation  Patient identified by MRN, date of birth, ID band Patient awake    Reviewed: Allergy & Precautions, H&P , NPO status , Patient's Chart, lab work & pertinent test results  History of Anesthesia Complications Negative for: history of anesthetic complications  Airway Mallampati: II  TM Distance: >3 FB Neck ROM: Full    Dental no notable dental hx. (+) Dental Advisory Given   Pulmonary neg pulmonary ROS, sleep apnea , PE (saddle pulmonary embolus underwent catheter directed thrombolysis)   Pulmonary exam normal breath sounds clear to auscultation       Cardiovascular hypertension, Pt. on medications and Pt. on home beta blockers pulmonary hypertension+ angina  + CAD  negative cardio ROS Normal cardiovascular exam Rhythm:Regular Rate:Normal  10/13/2022 cath: LM: Normal LAD: Minimal luminal irregularities Lcx: Large, dominant. Normal RCA: Small, nondominant.  Normal  LVEDP normal    No angiographically evident significant coronary artery disease  10/12/2022 ECHO: 1. EF 65 to 70%. The LV has normal function. The left ventricle has no regional wall motion abnormalities. Left ventricular diastolic parameters are  consistent with Grade I diastolic dysfunction (impaired relaxation).   2. Right ventricular systolic function is normal. The right ventricular  size is normal. There is severely elevated pulmonary artery systolic  pressure. The estimated right ventricular systolic pressure is 69.2 mmHg.   3. The mitral valve is grossly normal. Mild mitral valve regurgitation.  No evidence of mitral stenosis.   4. The tricuspid valve is abnormal. Tricuspid valve regurgitation is  moderate.   5. The aortic valve is tricuspid. There is mild calcification of the  aortic valve. Aortic valve regurgitation is mild. No aortic stenosis     Neuro/Psych negative neurological ROS  negative psych ROS   GI/Hepatic negative GI  ROS, Neg liver ROS, hiatal hernia,GERD  Medicated and Controlled,,  Endo/Other  negative endocrine ROS    Renal/GU Renal InsufficiencyRenal diseasenegative Renal ROS  negative genitourinary   Musculoskeletal negative musculoskeletal ROS (+)    Abdominal   Peds negative pediatric ROS (+)  Hematology negative hematology ROS (+) Hb 13.5, plt 56k: plts infusing heparin   Anesthesia Other Findings   Reproductive/Obstetrics negative OB ROS                             Anesthesia Physical Anesthesia Plan  ASA: 3  Anesthesia Plan: General   Post-op Pain Management: Minimal or no pain anticipated   Induction: Intravenous  PONV Risk Score and Plan: 2 and Ondansetron and Dexamethasone  Airway Management Planned: Oral ETT  Additional Equipment: None  Intra-op Plan:   Post-operative Plan: Possible Post-op intubation/ventilation  Informed Consent: I have reviewed the patients History and Physical, chart, labs and discussed the procedure including the risks, benefits and alternatives for the proposed anesthesia with the patient or authorized representative who has indicated his/her understanding and acceptance.     Dental advisory given  Plan Discussed with: CRNA and Anesthesiologist  Anesthesia Plan Comments:         Anesthesia Quick Evaluation

## 2022-10-16 NOTE — Progress Notes (Signed)
NAME:  Ralph Blackwell, MRN:  [id number], DOB:  July 13, 1941, LOS: 4 ADMISSION DATE:  10/11/2022, CONSULTATION DATE:  10/13/22 REFERRING MD:  Dr. Doristine Bosworth, CHIEF COMPLAINT:  SOB, Chest Pain   History of Present Illness:  82 year old man who presented to [facility] ED 3/30 with progressive SOB over several weeks.  The patient reports they traveled approximately 3 weeks ago to the [place].  While on vacation he walked 2 miles per day.  He typically is normally very active.  He notes when he returned home he was taking his dog out to go to to the bathroom and he developed increasing shortness of breath and chest tightness.  He describes it as a "vice on his chest". He states he did not feel like he was good to be able to make it back into the house.  He attempted sublingual nitro and he did have some improvement in symptoms.  Noting chest pain radiated to both arms.  He was evaluated by Cardiology on presentation and was thought to have unstable angina.  In route with EMS the patient was reportedly very diaphoretic and short of breath.  He was started on nitro and heparin infusion.  He continued to have chest pain with shortness of breath with minimal activity such as brushing teeth.  He was taken for left heart cath on 4/1 with findings of no angiographically evident significant coronary artery disease.  No acute findings to suggest cause of patient's chest pain.  Prior echocardiogram performed approximately a week before admission showed an RVSP of 28 mmHg.  Repeat ECHO this admission showed RVSP of 69 mmHg.  The patient subsequently had a CT angio of the chest which was positive for saddle PE with right greater than left pulmonary emboli, noted CT evidence of right heart strain with RV LV ratio of 1.18 consistent with at least submassive PE.  Patient was evaluated by interventional radiology.  PCCM consulted for evaluation   Pertinent Medical History:  CAD HTN Pulmonary embolism-2008 after knee surgery,  subsequent fall with wrist fracture OSA Renal insufficiency GERD Gout Hiatal hernia  Significant Hospital Events: Including procedures, antibiotic start and stop dates in addition to other pertinent events   3/30 admit with SOB, chest pain 4/1 LHC with no angiographically evident coronary artery disease to suggest cause of chest pain.  Noted elevated RVSP on echo. CTA completed & positive for saddle PE. IR/PCCM consulted.  4/2 hemodynamically stable, though had an episode of CP this morning 4/3 Acute onset of RUE hand/wrist/forearm swelling, c/f compartment syndrome. VVS/Hand Surgery consulted. Plan for OR for fasciotomy.  Interim History / Subjective:  Hand pain from swelling getting worse this morning. Hand surgery and vascular surgery both at bedside during rounds this morning.   Objective:  Blood pressure 105/77, pulse 94, temperature 98.6 F (37 C), temperature source Oral, resp. rate 10, height 5' 9.5" (1.765 m), weight 93 kg, SpO2 94 %. CVP:  [9 mmHg] 9 mmHg      Intake/Output Summary (Last 24 hours) at 10/16/2022 0929 Last data filed at 10/16/2022 0800 Gross per 24 hour  Intake 1983.76 ml  Output 2150 ml  Net -166.24 ml    Filed Weights   10/11/22 1821 10/14/22 0300 10/15/22 1417  Weight: 93 kg 92.4 kg 93 kg   Physical Examination: General: elderly man sitting up in the chair in NAD HEENT: [place]/AT, eyes anicteric Neuro: Awake, alert, normal speech, answering questions appropriately. Moving extremities. Able to abduct and adduct all fingers on R hand,  able to make a fist and extend fingers-- most of limited ROM due to edema. Other extremities without deficit. CV: S1S2, RRR PULM: breathing comfortably on [place], CTAB GI: soft, NT Extremities: R hand with wound vac, severely edematous from hematoma. Other extremities unremarkable. Radial artery and hand arch with triphasic dopplered pulses. Skin: bruising R arm, no diffuse rashes  Na+  131 BUN 25 Cr 1.05 WBC 8 H/H  11.1/34 Platelets 79  Resolved Hospital Problem List:     Assessment & Plan:   Acute submassive pulmonary embolism causing acute hypoxic respiratory failure; s/p catheter directed thrombolytics Chest pain / dyspnea secondary to Above  Hx Prior PE (2008, provoked after surgery) Had persistent intermittent chest pressure despite heparin for several days.  - heparin infusion; hand & vascular surgery ok with continuing despite hematoma in arm - supplemental O2 to maintain SpO2 >90% -pulmonary hygiene  Compartment syndrome of RUE, s/p volar compartment release  R radial artery pseudoaneurysm -appreciate management by Hand surgery & Vascular surgery teams -Dressing removed, arm examined, dressing replaced during rounds by surgeons. Compressive dressing to help reduce bleeding into that space. Ok to con't heparin. Will give 1 unit platelets today.  - keep entire arm elevated -pain control- tylenol, oxycodone, fentanyl PRN -doppler pulses PRN & as needed for symptoms -Per vascular surgery, NPO this morning while we keep watching his hand. Planning tentatively to return to OR tomorrow with VVS for washout.   Chronic thrombocytopenia  - 1 unit platelets today  CAD  Mixed Hyperlipidemia  -statin -hold metoprolol & irbesartan; need to ensure adequate hand perfusion  GERD  - con't PPI  Hyponatremia -monitor -avoid hypotonic fluids  Anemia due to acute blood loss from hematoma -transfuse for Hb <7 or hemodynamically significant bleeding  Hyperglycemia -adding accuchecks and SSI PRN  BPH  -proscar  Son updated at bedside with Dr. Tempie Donning and Dr. Carlis Abbott.  Best Practice: (right click and "Reselect all SmartList Selections" daily)  Diet/type: NPO- hopefully can eat dinner tonight DVT prophylaxis: systemic heparin GI prophylaxis: PPI Lines: N/A Foley:  N/A Code Status:  full code Last date of multidisciplinary goals of care discussion: full code, family updated at the bedside  4/3  Critical care time: 39 minutes   This patient is critically ill with multiple organ system failure which requires frequent high complexity decision making, assessment, support, evaluation, and titration of therapies. This was completed through the application of advanced monitoring technologies and extensive interpretation of multiple databases. During this encounter critical care time was devoted to patient care services described in this note for 40 minutes.  Julian Hy, DO 10/16/22 9:55 AM [facility]  For contact information, see Amion. If no response to pager, please call PCCM consult pager. After hours, 7PM- 7AM, please call Elink.

## 2022-10-16 NOTE — Progress Notes (Signed)
Patient resting in bedside chair.  He notes that the swelling of his hand and fingers has worsened this morning.  He describes "tightness" in the forearm but mild pain.  He notes that the pain is nowhere near what he was experiencing yesterday prior to his forearm fasciotomy.  He denies numbness or paresthesias in his fingers.  On exam, the volar aspect of the arm does appear slightly more swollen.  The wound vac is in place with good seal.  There is increased ecchymosis of the volar aspect of the arm.  The volar forearm remains soft.  He has no tenderness to palpation along dorsal forearm.  He has no tenderness at dorsum of hand, over mid palm, over thenar eminence, or hypothenar eminence. The compartments of the hand are swollen and edematous but soft and without tension as would be seen with acute compartment syndrome of the hand.  He is able to flex and extend the fingers but is limited by swelling and edema rather than pain.  He has no pain with full passive flexion or extension of the fingers.  Adduction and abduction of the fingers is intact. Sensation is intact to light touch in the median, ulnar, and radial distribution in all fingers.  A pulse ox monitor was placed on the right index finger with Sp02 >95%.  There was triphasic signal in the radial, ulnar, and palmar arch.   Discussed with patient and his family that he has good perfusion of the hand with no sign of dorsal or hand compartment syndrome.  The superficial and deep volar compartments and mobile wad were released yesterday.  It is likely that he has continued oozing into the volar forearm given his therapeutic heparinization and thrombocytopenia.  There was oozing from the open wound in the OR yesterday but no significant arterial or venous bleeding.  We will continue the wound vac for now and continue to monitor his symptoms.  If need be, we can transition him from [facility] to wet to dry dressings if the vac fails.  He will be NPO in case he  needs to return to the OR today.  Dr. Carlis Abbott of vascular surgery is otherwise planning on taking patient back to the OR tomorrow for repeat I&D.  Sherilyn Cooter, M.D. [facility]

## 2022-10-16 NOTE — Progress Notes (Signed)
Referring Physician(s): Dr. Noemi Chapel  Supervising Physician: Aletta Edouard  Patient Status:  [facility] - In-pt  Chief Complaint: Pulmonary embolus  Subjective: Patient resting comfortably in bed this afternoon.  Attempted visit earlier this AM but was found to be ambulating in the halls with tolerance.  During ambulation, maintaining adequate O2, HR stable at 115.   Ongoing intervention anticipated with right forearm/hand compartment syndrome.   Allergies: Patient has no known allergies.  Medications: Prior to Admission medications   Medication Sig Start Date End Date Taking? Authorizing Provider  allopurinol (ZYLOPRIM) 300 MG tablet Take 150 mg by mouth daily.    Yes [provider]  ascorbic acid (VITAMIN C) 500 MG tablet Take 500 mg by mouth daily.   Yes [provider]  Cholecalciferol (VITAMIN D3) 5000 units CAPS Take 2,000 Units by mouth daily with supper.    Yes [provider]  finasteride (PROSCAR) 5 MG tablet Take 5 mg by mouth at bedtime.    Yes [provider]  fluticasone (FLONASE) 50 MCG/ACT nasal spray Place 1 spray into both nostrils every evening.   Yes [provider]  magnesium gluconate (MAGONATE) 500 MG tablet Take 500 mg by mouth daily.   Yes [provider]  nitroGLYCERIN (NITROSTAT) 0.4 MG SL tablet Place 0.4 mg under the tongue every 5 (five) minutes as needed for chest pain.   Yes [provider]  Omega-3 Fatty Acids (FISH OIL) 1000 MG CAPS Take 1,000 mg by mouth daily.   Yes [provider]  omeprazole (PRILOSEC) 40 MG capsule Take 1 capsule (40 mg total) by mouth daily. 04/21/22  Yes Patwardhan, Manish J, MD  rosuvastatin (CRESTOR) 20 MG tablet Take 1 tablet (20 mg total) by mouth daily. 06/30/22 06/25/23 Yes Patwardhan, Manish J, MD  telmisartan (MICARDIS) 80 MG tablet Take 40 mg by mouth daily.   Yes [provider]  zinc gluconate 50 MG tablet Take 50 mg by mouth daily.    Yes [provider]     Vital Signs: BP 111/70   Pulse 83   Temp 97.8 F (36.6 C) (Oral)   Resp 16   Ht 5' 9.5" (1.765 m)   Wt 205 lb 0.4 oz (93 kg)   SpO2 96%   BMI 29.84 kg/m   Physical Exam NAD, resting in bed. Skin: groin procedure site intact, clean, and dry.  No oozing.  Dressing replaced- thrombipad removed.   Imaging: VAS [place] UPPER EXTREMITY ARTERIAL DUPLEX  Result Date: 10/15/2022  UPPER EXTREMITY DUPLEX STUDY Patient Name:  DEMETRICUS WICKERSHAM  Date of Exam:   10/15/2022 Medical Rec #: [id number]        Accession #:    [id number] Date of Birth: 04-Mar-1941        Patient Gender: M Patient Age:   82 years Exam Location:  [facility] Procedure:      VAS [place] UPPER EXTREMITY ARTERIAL DUPLEX Referring Phys: Jamelle Haring --------------------------------------------------------------------------------  Performing Technologist: Rogelia Rohrer RVT, RDMS  Examination Guidelines: A complete evaluation includes B-mode imaging, spectral Doppler, color Doppler, and power Doppler as needed of all accessible portions of each vessel. Bilateral testing is considered an integral part of a complete examination. Limited examinations for reoccurring indications may be performed as noted.  Right Doppler Findings: +---------------+----------+-----------+--------+-----------------------------+ Site           PSV (cm/s)Waveform   StenosisComments                      +---------------+----------+-----------+--------+-----------------------------+  Subclavian Dist                                                           +---------------+----------+-----------+--------+-----------------------------+ Radial Prox                                 not visualized - IV placement +---------------+----------+-----------+--------+-----------------------------+ Radial Mid     53        multiphasic                                       +---------------+----------+-----------+--------+-----------------------------+ Radial Dist    105       multiphasic                                      +---------------+----------+-----------+--------+-----------------------------+ Ulnar Dist     45        triphasic                                        +---------------+----------+-----------+--------+-----------------------------+   An area with well defined borders measuring 2.4 cm x 2.0 cm was visualized arising off of the distal radial artery with ultrasound characteristics of a pseudoaneurysm. Mixed echos within the structure suggest that it is partially thrombosed with a residual diameter of 0.5 cm x 0.4 cm. Unable to visualize radial,ulnar, or brachial veins - possibly due to extrinsic compression from subcutaneous edema.  Electronically signed by Jamelle Haring on 10/15/2022 at 9:55:33 PM.    Final    IR THROMB F/U EVAL ART/VEN FINAL DAY (MS)  Result Date: 10/15/2022 INDICATION: 82 year old male status post thrombolysis for submassive pulmonary embolism EXAM: FOLLOW-UP PULMONARY ARTERY THROMBOLYSIS COMPARISON:  10/14/2022, CT 10/13/2022 MEDICATIONS: None ANESTHESIA/SEDATION: None FLUOROSCOPY: None COMPLICATIONS: None TECHNIQUE: Informed written consent was previously obtained from the patient after a thorough discussion of the procedural risks, benefits and alternatives. Bedside trans duction of both the left and the right pulmonary artery lysis catheters was attempted in the ICU. A confident waveform pressure could not be confirmed, with multiple troubleshooting steps. Ultimately, the catheters were withdrawn without obtaining a follow-up pulmonary artery pressure. Both lysis catheters were removed. Given the patient's operative status, the venous sheaths were left in place for central venous access. FINDINGS: No central venous pressure was obtained. IMPRESSION: Follow-up pulmonary artery thrombolysis as above. Electronically Signed   By:  Corrie Mckusick D.O.   On: 10/15/2022 17:00   VAS [place] LOWER EXTREMITY VENOUS (DVT)  Result Date: 10/14/2022  Lower Venous DVT Study Patient Name:  BENI KUBAS  Date of Exam:   10/14/2022 Medical Rec #: [id number]        Accession #:    [id number] Date of Birth: 21-Sep-1940        Patient Gender: M Patient Age:   86 years Exam Location:  [facility] Procedure:      VAS [place] LOWER EXTREMITY VENOUS (DVT) Referring Phys: Noe Gens --------------------------------------------------------------------------------  Indications: Pulmonary embolism.  Risk Factors: Confirmed PE. Anticoagulation: Heparin.  Limitations: Bandages, line and patient positioning, patient immobility. Comparison Study: No prior studies. Performing Technologist: Oliver Hum RVT  Examination Guidelines: A complete evaluation includes B-mode imaging, spectral Doppler, color Doppler, and power Doppler as needed of all accessible portions of each vessel. Bilateral testing is considered an integral part of a complete examination. Limited examinations for reoccurring indications may be performed as noted. The reflux portion of the exam is performed with the patient in reverse Trendelenburg.  +---------+---------------+---------+-----------+----------+--------------+ RIGHT    CompressibilityPhasicitySpontaneityPropertiesThrombus Aging +---------+---------------+---------+-----------+----------+--------------+ CFV      Full           Yes      Yes                                 +---------+---------------+---------+-----------+----------+--------------+ SFJ      Full                                                        +---------+---------------+---------+-----------+----------+--------------+ FV Prox  Full                                                        +---------+---------------+---------+-----------+----------+--------------+ FV Mid   Full                                                         +---------+---------------+---------+-----------+----------+--------------+ FV DistalFull                                                        +---------+---------------+---------+-----------+----------+--------------+ PFV      Full                                                        +---------+---------------+---------+-----------+----------+--------------+ POP      None           No       No                   Acute          +---------+---------------+---------+-----------+----------+--------------+ PTV      Full                                                        +---------+---------------+---------+-----------+----------+--------------+ PERO     Full                                                        +---------+---------------+---------+-----------+----------+--------------+  Gastroc  Partial                                      Acute          +---------+---------------+---------+-----------+----------+--------------+   +---------+---------------+---------+-----------+----------+--------------+ LEFT     CompressibilityPhasicitySpontaneityPropertiesThrombus Aging +---------+---------------+---------+-----------+----------+--------------+ CFV      Full           Yes      Yes                                 +---------+---------------+---------+-----------+----------+--------------+ SFJ      Full                                                        +---------+---------------+---------+-----------+----------+--------------+ FV Prox  Full                                                        +---------+---------------+---------+-----------+----------+--------------+ FV Mid   Full                                                        +---------+---------------+---------+-----------+----------+--------------+ FV DistalFull                                                         +---------+---------------+---------+-----------+----------+--------------+ PFV      Full                                                        +---------+---------------+---------+-----------+----------+--------------+ POP      Full           Yes      Yes                                 +---------+---------------+---------+-----------+----------+--------------+ PTV      Full                                                        +---------+---------------+---------+-----------+----------+--------------+ PERO     Full                                                        +---------+---------------+---------+-----------+----------+--------------+  Summary: RIGHT: - Findings consistent with acute deep vein thrombosis involving the right popliteal vein, and right gastrocnemius veins. - No cystic structure found in the popliteal fossa.  LEFT: - There is no evidence of deep vein thrombosis in the lower extremity.  - No cystic structure found in the popliteal fossa.  *See table(s) above for measurements and observations. Electronically signed by Deitra Mayo MD on 10/14/2022 at 4:22:02 PM.    Final    IR US Guide Vasc Access Right  Result Date: 10/14/2022 INDICATION: Submassive saddle pulmonary embolus, right heart strain, chest discomfort despite heparin therapy EXAM: ULTRASOUND GUIDANCE FOR VASCULAR ACCESS BILATERAL PULMONARY ARTERY CATHETERIZATIONS, ANGIOGRAMS, PRESSURE MEASUREMENT, AND INFUSION CATHETER PLACEMENTS FOR PE THROMBO LYSIS COMPARISON:  None Available. MEDICATIONS: 1% lidocaine local ANESTHESIA/SEDATION: Moderate (conscious) sedation was employed during this procedure. A total of Versed 1.5 mg and Fentanyl 50 mcg was administered intravenously by the radiology nurse. Total intra-service moderate Sedation Time: The 40 minutes. The patient's level of consciousness and vital signs were monitored continuously by radiology nursing throughout the procedure under my direct  supervision. FLUOROSCOPY: Radiation Exposure Index (as provided by the fluoroscopic device): Q000111Q mGy Kerma COMPLICATIONS: None immediate. TECHNIQUE: Informed written consent was obtained from the patient after a thorough discussion of the procedural risks, benefits and alternatives. All questions were addressed. Maximal Sterile Barrier Technique was utilized including caps, mask, sterile gowns, sterile gloves, sterile drape, hand hygiene and skin antiseptic. A timeout was performed prior to the initiation of the procedure. Under sterile conditions and local anesthesia, ultrasound micropuncture access performed the right common femoral vein twice. Adjacent 6 French sheath were inserted over Bentson guidewires into the right common femoral vein. Images obtained for documentation of the patent right common femoral vein. Five [place] PA catheters were advanced into the pulmonary arteries bilaterally utilizing both Bentson and 035 glide wires. Contrast injection performed for bilateral limited pulmonary angiography. Nearly occlusive central thrombus present on the right. More peripheral nonocclusive thrombus present on the left. Central PA pressure measurement: 42/15 (25). Over bilateral Rosen guidewires, the 90 cm infusion catheters with 10 cm infusion length were advanced into the pulmonary arteries. Internal occlusion wires advanced. Images obtained for documentation of catheter positions. Access secured with a silk suture. 12 hour thrombolysis protocol will be initiated through the bilateral pulmonary artery UniFuse catheters. FINDINGS: Imaging confirms right greater than left central pulmonary emboli better demonstrated by the comparison CTA. Successful placement of bilateral 90 cm infusion catheters with 10 cm infusion leaks into both pulmonary arteries. IMPRESSION: Successful placement of bilateral pulmonary artery infusion catheters for PE thrombolysis 12 hour protocol. Electronically Signed   By: Jerilynn Mages.  Shick M.D.    On: 10/14/2022 13:10   IR INFUSION THROMBOL ARTERIAL INITIAL (MS)  Result Date: 10/14/2022 INDICATION: Submassive saddle pulmonary embolus, right heart strain, chest discomfort despite heparin therapy EXAM: ULTRASOUND GUIDANCE FOR VASCULAR ACCESS BILATERAL PULMONARY ARTERY CATHETERIZATIONS, ANGIOGRAMS, PRESSURE MEASUREMENT, AND INFUSION CATHETER PLACEMENTS FOR PE THROMBO LYSIS COMPARISON:  None Available. MEDICATIONS: 1% lidocaine local ANESTHESIA/SEDATION: Moderate (conscious) sedation was employed during this procedure. A total of Versed 1.5 mg and Fentanyl 50 mcg was administered intravenously by the radiology nurse. Total intra-service moderate Sedation Time: The 40 minutes. The patient's level of consciousness and vital signs were monitored continuously by radiology nursing throughout the procedure under my direct supervision. FLUOROSCOPY: Radiation Exposure Index (as provided by the fluoroscopic device): Q000111Q mGy Kerma COMPLICATIONS: None immediate. TECHNIQUE: Informed written consent was obtained from the patient after a thorough discussion  of the procedural risks, benefits and alternatives. All questions were addressed. Maximal Sterile Barrier Technique was utilized including caps, mask, sterile gowns, sterile gloves, sterile drape, hand hygiene and skin antiseptic. A timeout was performed prior to the initiation of the procedure. Under sterile conditions and local anesthesia, ultrasound micropuncture access performed the right common femoral vein twice. Adjacent 6 French sheath were inserted over Bentson guidewires into the right common femoral vein. Images obtained for documentation of the patent right common femoral vein. Five [place] PA catheters were advanced into the pulmonary arteries bilaterally utilizing both Bentson and 035 glide wires. Contrast injection performed for bilateral limited pulmonary angiography. Nearly occlusive central thrombus present on the right. More peripheral nonocclusive  thrombus present on the left. Central PA pressure measurement: 42/15 (25). Over bilateral Rosen guidewires, the 90 cm infusion catheters with 10 cm infusion length were advanced into the pulmonary arteries. Internal occlusion wires advanced. Images obtained for documentation of catheter positions. Access secured with a silk suture. 12 hour thrombolysis protocol will be initiated through the bilateral pulmonary artery UniFuse catheters. FINDINGS: Imaging confirms right greater than left central pulmonary emboli better demonstrated by the comparison CTA. Successful placement of bilateral 90 cm infusion catheters with 10 cm infusion leaks into both pulmonary arteries. IMPRESSION: Successful placement of bilateral pulmonary artery infusion catheters for PE thrombolysis 12 hour protocol. Electronically Signed   By: Jerilynn Mages.  Shick M.D.   On: 10/14/2022 13:10   IR INFUSION THROMBOL ARTERIAL INITIAL (MS)  Result Date: 10/14/2022 INDICATION: Submassive saddle pulmonary embolus, right heart strain, chest discomfort despite heparin therapy EXAM: ULTRASOUND GUIDANCE FOR VASCULAR ACCESS BILATERAL PULMONARY ARTERY CATHETERIZATIONS, ANGIOGRAMS, PRESSURE MEASUREMENT, AND INFUSION CATHETER PLACEMENTS FOR PE THROMBO LYSIS COMPARISON:  None Available. MEDICATIONS: 1% lidocaine local ANESTHESIA/SEDATION: Moderate (conscious) sedation was employed during this procedure. A total of Versed 1.5 mg and Fentanyl 50 mcg was administered intravenously by the radiology nurse. Total intra-service moderate Sedation Time: The 40 minutes. The patient's level of consciousness and vital signs were monitored continuously by radiology nursing throughout the procedure under my direct supervision. FLUOROSCOPY: Radiation Exposure Index (as provided by the fluoroscopic device): Q000111Q mGy Kerma COMPLICATIONS: None immediate. TECHNIQUE: Informed written consent was obtained from the patient after a thorough discussion of the procedural risks, benefits and  alternatives. All questions were addressed. Maximal Sterile Barrier Technique was utilized including caps, mask, sterile gowns, sterile gloves, sterile drape, hand hygiene and skin antiseptic. A timeout was performed prior to the initiation of the procedure. Under sterile conditions and local anesthesia, ultrasound micropuncture access performed the right common femoral vein twice. Adjacent 6 French sheath were inserted over Bentson guidewires into the right common femoral vein. Images obtained for documentation of the patent right common femoral vein. Five [place] PA catheters were advanced into the pulmonary arteries bilaterally utilizing both Bentson and 035 glide wires. Contrast injection performed for bilateral limited pulmonary angiography. Nearly occlusive central thrombus present on the right. More peripheral nonocclusive thrombus present on the left. Central PA pressure measurement: 42/15 (25). Over bilateral Rosen guidewires, the 90 cm infusion catheters with 10 cm infusion length were advanced into the pulmonary arteries. Internal occlusion wires advanced. Images obtained for documentation of catheter positions. Access secured with a silk suture. 12 hour thrombolysis protocol will be initiated through the bilateral pulmonary artery UniFuse catheters. FINDINGS: Imaging confirms right greater than left central pulmonary emboli better demonstrated by the comparison CTA. Successful placement of bilateral 90 cm infusion catheters with 10 cm infusion leaks into both pulmonary  arteries. IMPRESSION: Successful placement of bilateral pulmonary artery infusion catheters for PE thrombolysis 12 hour protocol. Electronically Signed   By: Jerilynn Mages.  Shick M.D.   On: 10/14/2022 13:10   IR Angiogram Pulmonary Bilateral Selective  Result Date: 10/14/2022 INDICATION: Submassive saddle pulmonary embolus, right heart strain, chest discomfort despite heparin therapy EXAM: ULTRASOUND GUIDANCE FOR VASCULAR ACCESS BILATERAL PULMONARY  ARTERY CATHETERIZATIONS, ANGIOGRAMS, PRESSURE MEASUREMENT, AND INFUSION CATHETER PLACEMENTS FOR PE THROMBO LYSIS COMPARISON:  None Available. MEDICATIONS: 1% lidocaine local ANESTHESIA/SEDATION: Moderate (conscious) sedation was employed during this procedure. A total of Versed 1.5 mg and Fentanyl 50 mcg was administered intravenously by the radiology nurse. Total intra-service moderate Sedation Time: The 40 minutes. The patient's level of consciousness and vital signs were monitored continuously by radiology nursing throughout the procedure under my direct supervision. FLUOROSCOPY: Radiation Exposure Index (as provided by the fluoroscopic device): Q000111Q mGy Kerma COMPLICATIONS: None immediate. TECHNIQUE: Informed written consent was obtained from the patient after a thorough discussion of the procedural risks, benefits and alternatives. All questions were addressed. Maximal Sterile Barrier Technique was utilized including caps, mask, sterile gowns, sterile gloves, sterile drape, hand hygiene and skin antiseptic. A timeout was performed prior to the initiation of the procedure. Under sterile conditions and local anesthesia, ultrasound micropuncture access performed the right common femoral vein twice. Adjacent 6 French sheath were inserted over Bentson guidewires into the right common femoral vein. Images obtained for documentation of the patent right common femoral vein. Five [place] PA catheters were advanced into the pulmonary arteries bilaterally utilizing both Bentson and 035 glide wires. Contrast injection performed for bilateral limited pulmonary angiography. Nearly occlusive central thrombus present on the right. More peripheral nonocclusive thrombus present on the left. Central PA pressure measurement: 42/15 (25). Over bilateral Rosen guidewires, the 90 cm infusion catheters with 10 cm infusion length were advanced into the pulmonary arteries. Internal occlusion wires advanced. Images obtained for  documentation of catheter positions. Access secured with a silk suture. 12 hour thrombolysis protocol will be initiated through the bilateral pulmonary artery UniFuse catheters. FINDINGS: Imaging confirms right greater than left central pulmonary emboli better demonstrated by the comparison CTA. Successful placement of bilateral 90 cm infusion catheters with 10 cm infusion leaks into both pulmonary arteries. IMPRESSION: Successful placement of bilateral pulmonary artery infusion catheters for PE thrombolysis 12 hour protocol. Electronically Signed   By: Jerilynn Mages.  Shick M.D.   On: 10/14/2022 13:10   IR Angiogram Selective Each Additional Vessel  Result Date: 10/14/2022 INDICATION: Submassive saddle pulmonary embolus, right heart strain, chest discomfort despite heparin therapy EXAM: ULTRASOUND GUIDANCE FOR VASCULAR ACCESS BILATERAL PULMONARY ARTERY CATHETERIZATIONS, ANGIOGRAMS, PRESSURE MEASUREMENT, AND INFUSION CATHETER PLACEMENTS FOR PE THROMBO LYSIS COMPARISON:  None Available. MEDICATIONS: 1% lidocaine local ANESTHESIA/SEDATION: Moderate (conscious) sedation was employed during this procedure. A total of Versed 1.5 mg and Fentanyl 50 mcg was administered intravenously by the radiology nurse. Total intra-service moderate Sedation Time: The 40 minutes. The patient's level of consciousness and vital signs were monitored continuously by radiology nursing throughout the procedure under my direct supervision. FLUOROSCOPY: Radiation Exposure Index (as provided by the fluoroscopic device): Q000111Q mGy Kerma COMPLICATIONS: None immediate. TECHNIQUE: Informed written consent was obtained from the patient after a thorough discussion of the procedural risks, benefits and alternatives. All questions were addressed. Maximal Sterile Barrier Technique was utilized including caps, mask, sterile gowns, sterile gloves, sterile drape, hand hygiene and skin antiseptic. A timeout was performed prior to the initiation of the procedure. Under  sterile conditions and local  anesthesia, ultrasound micropuncture access performed the right common femoral vein twice. Adjacent 6 French sheath were inserted over Bentson guidewires into the right common femoral vein. Images obtained for documentation of the patent right common femoral vein. Five [place] PA catheters were advanced into the pulmonary arteries bilaterally utilizing both Bentson and 035 glide wires. Contrast injection performed for bilateral limited pulmonary angiography. Nearly occlusive central thrombus present on the right. More peripheral nonocclusive thrombus present on the left. Central PA pressure measurement: 42/15 (25). Over bilateral Rosen guidewires, the 90 cm infusion catheters with 10 cm infusion length were advanced into the pulmonary arteries. Internal occlusion wires advanced. Images obtained for documentation of catheter positions. Access secured with a silk suture. 12 hour thrombolysis protocol will be initiated through the bilateral pulmonary artery UniFuse catheters. FINDINGS: Imaging confirms right greater than left central pulmonary emboli better demonstrated by the comparison CTA. Successful placement of bilateral 90 cm infusion catheters with 10 cm infusion leaks into both pulmonary arteries. IMPRESSION: Successful placement of bilateral pulmonary artery infusion catheters for PE thrombolysis 12 hour protocol. Electronically Signed   By: Jerilynn Mages.  Shick M.D.   On: 10/14/2022 13:10   IR Angiogram Selective Each Additional Vessel  Result Date: 10/14/2022 INDICATION: Submassive saddle pulmonary embolus, right heart strain, chest discomfort despite heparin therapy EXAM: ULTRASOUND GUIDANCE FOR VASCULAR ACCESS BILATERAL PULMONARY ARTERY CATHETERIZATIONS, ANGIOGRAMS, PRESSURE MEASUREMENT, AND INFUSION CATHETER PLACEMENTS FOR PE THROMBO LYSIS COMPARISON:  None Available. MEDICATIONS: 1% lidocaine local ANESTHESIA/SEDATION: Moderate (conscious) sedation was employed during this procedure.  A total of Versed 1.5 mg and Fentanyl 50 mcg was administered intravenously by the radiology nurse. Total intra-service moderate Sedation Time: The 40 minutes. The patient's level of consciousness and vital signs were monitored continuously by radiology nursing throughout the procedure under my direct supervision. FLUOROSCOPY: Radiation Exposure Index (as provided by the fluoroscopic device): [id number] mGy Kerma COMPLICATIONS: None immediate. TECHNIQUE: Informed written consent was obtained from the patient after a thorough discussion of the procedural risks, benefits and alternatives. All questions were addressed. Maximal Sterile Barrier Technique was utilized including caps, mask, sterile gowns, sterile gloves, sterile drape, hand hygiene and skin antiseptic. A timeout was performed prior to the initiation of the procedure. Under sterile conditions and local anesthesia, ultrasound micropuncture access performed the right common femoral vein twice. Adjacent 6 French sheath were inserted over Bentson guidewires into the right common femoral vein. Images obtained for documentation of the patent right common femoral vein. Five [place] PA catheters were advanced into the pulmonary arteries bilaterally utilizing both Bentson and 035 glide wires. Contrast injection performed for bilateral limited pulmonary angiography. Nearly occlusive central thrombus present on the right. More peripheral nonocclusive thrombus present on the left. Central PA pressure measurement: 42/15 (25). Over bilateral Rosen guidewires, the 90 cm infusion catheters with 10 cm infusion length were advanced into the pulmonary arteries. Internal occlusion wires advanced. Images obtained for documentation of catheter positions. Access secured with a silk suture. 12 hour thrombolysis protocol will be initiated through the bilateral pulmonary artery UniFuse catheters. FINDINGS: Imaging confirms right greater than left central pulmonary emboli better demonstrated  by the comparison CTA. Successful placement of bilateral 90 cm infusion catheters with 10 cm infusion leaks into both pulmonary arteries. IMPRESSION: Successful placement of bilateral pulmonary artery infusion catheters for PE thrombolysis 12 hour protocol. Electronically Signed   By: Jerilynn Mages.  Shick M.D.   On: 10/14/2022 13:10   CT Angio Chest Pulmonary Embolism (PE) W or WO Contrast  Addendum Date: 10/13/2022  ADDENDUM REPORT: 10/13/2022 15:59 ADDENDUM: Critical Value/emergent results were called by telephone at the time of interpretation on 10/13/2022 at 3:59 pm to provider [facility] , who verbally acknowledged these results. Electronically Signed   By: Lajean Manes M.D.   On: 10/13/2022 15:59   Result Date: 10/13/2022 CLINICAL DATA:  Unstable angina. Inpatient. Clinical suspicion for pulmonary embolism. EXAM: CT ANGIOGRAPHY CHEST WITH CONTRAST TECHNIQUE: Multidetector CT imaging of the chest was performed using the standard protocol during bolus administration of intravenous contrast. Multiplanar CT image reconstructions and MIPs were obtained to evaluate the vascular anatomy. RADIATION DOSE REDUCTION: This exam was performed according to the departmental dose-optimization program which includes automated exposure control, adjustment of the mA and/or kV according to patient size and/or use of iterative reconstruction technique. CONTRAST:  11mL OMNIPAQUE IOHEXOL 350 MG/ML SOLN COMPARISON:  01/25/2013.  Chest radiograph, 10/11/2022. FINDINGS: Cardiovascular:  Pulmonary arteries are well opacified. There are bilateral pulmonary emboli, greater on the right. A saddle embolus spans the right and left pulmonary arteries. Near occlusive emboli extend into the right lower lobe segmental pulmonary arteries. Nonocclusive emboli are noted in the right middle and upper lobe central pulmonary arteries. On the left nonocclusive pulmonary embolism extends from the saddle embolism into the central lingular branch pulmonary  artery. RV LV ratio is 1.18. Heart normal in size. No pericardial effusion. Great vessels are normal in caliber. Mild aortic atherosclerosis. No dissection. Mediastinum/Nodes: Significant enlargement of the left thyroid lobe that extends substernally. This represents a notable change from the chest CT dated 01/25/2013. No other mediastinal masses. No hilar masses. No mediastinal or hilar adenopathy. Trachea and esophagus are unremarkable. Lungs/Pleura: Minor basilar subsegmental atelectasis. Lungs otherwise clear. No pleural effusion or pneumothorax. Upper Abdomen: No acute finding. Musculoskeletal: No fracture or acute finding. No bone lesion. No chest wall mass. Review of the MIP images confirms the above findings. IMPRESSION: 1. Positive for pulmonary emboli. Right greater than left pulmonary emboli with a saddle pulmonary embolus as detailed. Positive for acute PE with CT evidence of right heart strain (RV/LV Ratio = 1.18) consistent with at least submassive (intermediate risk) PE. The presence of right heart strain has been associated with an increased risk of morbidity and mortality. Please refer to the "Code PE Focused" order set in EPIC. 2. No other acute finding. 3. Significant enlargement of the left thyroid lobe that extends substernally. Recommend thyroid ultrasound (ref: J Am Coll Radiol. 2015 Feb;12(2): 143-50). Aortic Atherosclerosis (ICD10-I70.0). Electronically Signed: By: Lajean Manes M.D. On: 10/13/2022 15:50   CARDIAC CATHETERIZATION  Result Date: 10/13/2022 Images from the original result were not included. LM: Normal LAD: Minimal luminal irregularities Lcx: Large, dominant. Normal RCA: Small, nondominant.  Normal LVEDP normal Resting Pd/Pa: 0.93 FFR: 0.88 CFR: 4.5 IMR: 27 (only marginally abnormal) No angiographically evident significant coronary artery disease Nearly normal microvascular indicis Above findings do not explain the severity of patient's chest pain shortness of breath. I  personally reviewed and compared echocardiograms.  Echocardiogram performed outpatient last week showed RVSP of 28 mmHg.  RVSP was noted to be 69 mmHg on inpatient echocardiogram yesterday.  Patient reportedly has history of PEs in the past. Given significant only different RVSP, patient's profound symptoms not explained by coronary or cardiac etiology, I strongly suspect acute pulmonary cause such as pulmonary embolism.  I personally discussed this with patient and patient's wife, as well as hospitalist Dr. Parks Ranger.  We will proceed with CT angiogram chest, with additional hydration.  If CT angiogram does  not reveal acute PE, I will perform right heart catheterization tomorrow. Nigel Mormon, MD Pager: [phone] Office: [phone]   Labs:  CBC: Recent Labs    10/15/22 0622 10/15/22 1231 10/15/22 2120 10/16/22 0140  WBC 8.3 9.6 8.7 8.0  HGB 13.4 13.5 11.1* 11.2*  HCT 40.5 40.9 34.3* 34.0*  PLT 52* 56* 78* 79*    COAGS: Recent Labs    10/15/22 1226 10/15/22 2120  INR 1.3* 1.3*  APTT 109* 105*    BMP: Recent Labs    10/14/22 2236 10/15/22 0622 10/15/22 1231 10/16/22 0140  NA 133* 135 133* 131*  K 4.4 4.4 4.3 4.6  CL 101 103 98 103  CO2 21* 22 23 22   GLUCOSE 153* 117* 140* 183*  BUN 20 18 23  25*  CALCIUM 8.7* 8.6* 8.8* 8.5*  CREATININE 1.09 1.03 1.15 1.05  GFRNONAA >60 >60 >60 >60    LIVER FUNCTION TESTS: Recent Labs    10/11/22 1823  BILITOT 0.9  AST 27  ALT 24  ALKPHOS 64  PROT 6.4*  ALBUMIN 3.4*    Assessment and Plan: Pulmonary embolus s/p PE lysis 4/2 Patient s/p catheter directed lysis via right fem approach.  Treatment was limited by development of compartment syndrome in the right hand.  Catheters removed 4/3.  Sheath also has since been removed.  Site assessed today.  Thrombipad in place x 24 hrs.  Removed and placed clean dressing.  Patient ambulating with improved tolerance. Conversing without ShOB.  IR remains available as needed.    Electronically Signed: Docia Barrier, PA 10/16/2022, 3:39 PM   I spent a total of 15 Minutes at the the patient's bedside AND on the patient's hospital floor or unit, greater than 50% of which was counseling/coordinating care for pulmonary embolus.

## 2022-10-16 NOTE — Progress Notes (Signed)
I was called given concern for loss of radial pulse.  The dressing was removed from his right forearm.  VAC is in place with seal.  Significant swelling to the right forearm and hand.  Brisk triphasic radial and palmar arch signal.  Rewrapped with Ace for compression and instructed him to elevate his arm.  Tentative plan return to OR tomorrow unless his exam changes today.  Right hand motor and sensory intact and well perfused.  Marty Heck, MD [facility] Office: [place]

## 2022-10-17 ENCOUNTER — Inpatient Hospital Stay (HOSPITAL_COMMUNITY): Payer: PPO | Admitting: Anesthesiology

## 2022-10-17 ENCOUNTER — Encounter (HOSPITAL_COMMUNITY): Payer: Self-pay | Admitting: Internal Medicine

## 2022-10-17 ENCOUNTER — Encounter (HOSPITAL_COMMUNITY)
Admission: EM | Disposition: A | Discharge status: Home Health | Payer: Self-pay | Source: Other Acute Inpatient Hospital | Attending: Critical Care Medicine

## 2022-10-17 ENCOUNTER — Other Ambulatory Visit: Payer: Self-pay

## 2022-10-17 DIAGNOSIS — T81718A Complication of other artery following a procedure, not elsewhere classified, initial encounter: Secondary | ICD-10-CM

## 2022-10-17 DIAGNOSIS — D62 Acute posthemorrhagic anemia: Secondary | ICD-10-CM | POA: Diagnosis not present

## 2022-10-17 DIAGNOSIS — I2602 Saddle embolus of pulmonary artery with acute cor pulmonale: Secondary | ICD-10-CM | POA: Diagnosis not present

## 2022-10-17 DIAGNOSIS — I721 Aneurysm of artery of upper extremity: Secondary | ICD-10-CM | POA: Diagnosis not present

## 2022-10-17 DIAGNOSIS — I200 Unstable angina: Secondary | ICD-10-CM | POA: Diagnosis not present

## 2022-10-17 DIAGNOSIS — D696 Thrombocytopenia, unspecified: Secondary | ICD-10-CM | POA: Diagnosis not present

## 2022-10-17 DIAGNOSIS — I2699 Other pulmonary embolism without acute cor pulmonale: Secondary | ICD-10-CM | POA: Diagnosis not present

## 2022-10-17 DIAGNOSIS — M79A11 Nontraumatic compartment syndrome of right upper extremity: Secondary | ICD-10-CM | POA: Diagnosis not present

## 2022-10-17 DIAGNOSIS — G4730 Sleep apnea, unspecified: Secondary | ICD-10-CM

## 2022-10-17 DIAGNOSIS — I97630 Postprocedural hematoma of a circulatory system organ or structure following a cardiac catheterization: Secondary | ICD-10-CM | POA: Diagnosis not present

## 2022-10-17 HISTORY — PX: I & D EXTREMITY: SHX5045

## 2022-10-17 LAB — GLUCOSE, CAPILLARY
Glucose-Capillary: 139 mg/dL — ABNORMAL HIGH (ref 70–99)
Glucose-Capillary: 145 mg/dL — ABNORMAL HIGH (ref 70–99)
Glucose-Capillary: 147 mg/dL — ABNORMAL HIGH (ref 70–99)
Glucose-Capillary: 151 mg/dL — ABNORMAL HIGH (ref 70–99)
Glucose-Capillary: 155 mg/dL — ABNORMAL HIGH (ref 70–99)
Glucose-Capillary: 190 mg/dL — ABNORMAL HIGH (ref 70–99)

## 2022-10-17 LAB — CBC
HCT: 29.2 % — ABNORMAL LOW (ref 39.0–52.0)
Hemoglobin: 9.6 g/dL — ABNORMAL LOW (ref 13.0–17.0)
MCH: 30.5 pg (ref 26.0–34.0)
MCHC: 32.9 g/dL (ref 30.0–36.0)
MCV: 92.7 fL (ref 80.0–100.0)
Platelets: 120 10*3/uL — ABNORMAL LOW (ref 150–400)
RBC: 3.15 MIL/uL — ABNORMAL LOW (ref 4.22–5.81)
RDW: 14.2 % (ref 11.5–15.5)
WBC: 12.7 10*3/uL — ABNORMAL HIGH (ref 4.0–10.5)
nRBC: 0 % (ref 0.0–0.2)

## 2022-10-17 LAB — PHOSPHORUS: Phosphorus: 3.3 mg/dL (ref 2.5–4.6)

## 2022-10-17 LAB — PREPARE PLATELET PHERESIS: Unit division: 0

## 2022-10-17 LAB — BPAM PLATELET PHERESIS
Blood Product Expiration Date: 202404062359
ISSUE DATE / TIME: 202404041045
Unit Type and Rh: 6200

## 2022-10-17 LAB — BASIC METABOLIC PANEL
Anion gap: 10 (ref 5–15)
BUN: 34 mg/dL — ABNORMAL HIGH (ref 8–23)
CO2: 24 mmol/L (ref 22–32)
Calcium: 8.9 mg/dL (ref 8.9–10.3)
Chloride: 101 mmol/L (ref 98–111)
Creatinine, Ser: 1.21 mg/dL (ref 0.61–1.24)
GFR, Estimated: >60 mL/min (ref >60)
Glucose, Bld: 136 mg/dL — ABNORMAL HIGH (ref 70–99)
Potassium: 4.8 mmol/L (ref 3.5–5.1)
Sodium: 135 mmol/L (ref 135–145)

## 2022-10-17 LAB — HEPARIN LEVEL (UNFRACTIONATED): Heparin Unfractionated: 0.45 IU/mL (ref 0.30–0.70)

## 2022-10-17 LAB — MAGNESIUM: Magnesium: 2.1 mg/dL (ref 1.7–2.4)

## 2022-10-17 SURGERY — IRRIGATION AND DEBRIDEMENT EXTREMITY
Anesthesia: General | Laterality: Right

## 2022-10-17 MED ORDER — PROPOFOL 500 MG/50ML IV EMUL
INTRAVENOUS | Status: DC | PRN
  Administered 2022-10-17: 75 ug/kg/min via INTRAVENOUS

## 2022-10-17 MED ORDER — FENTANYL CITRATE (PF) 100 MCG/2ML IJ SOLN: 25.0000 ug | INTRAMUSCULAR | Status: DC | PRN

## 2022-10-17 MED ORDER — KETOROLAC TROMETHAMINE 30 MG/ML IJ SOLN
INTRAMUSCULAR | Status: AC
  Filled 2022-10-17: qty 1

## 2022-10-17 MED ORDER — ONDANSETRON HCL 4 MG/2ML IJ SOLN: 4.0000 mg | Freq: Once | INTRAMUSCULAR | Status: DC | PRN

## 2022-10-17 MED ORDER — LIDOCAINE 2% (20 MG/ML) 5 ML SYRINGE
INTRAMUSCULAR | Status: DC | PRN
  Administered 2022-10-17: 100 mg via INTRAVENOUS

## 2022-10-17 MED ORDER — FENTANYL CITRATE (PF) 100 MCG/2ML IJ SOLN
INTRAMUSCULAR | Status: DC | PRN
  Administered 2022-10-17 (×3): 50 ug via INTRAVENOUS

## 2022-10-17 MED ORDER — OXYCODONE HCL 5 MG/5ML PO SOLN: 5.0000 mg | Freq: Once | ORAL | Status: DC | PRN

## 2022-10-17 MED ORDER — ROCURONIUM BROMIDE 100 MG/10ML IV SOLN
INTRAVENOUS | Status: DC | PRN
  Administered 2022-10-17: 40 mg via INTRAVENOUS

## 2022-10-17 MED ORDER — ROCURONIUM BROMIDE 10 MG/ML (PF) SYRINGE
PREFILLED_SYRINGE | INTRAVENOUS | Status: AC
  Filled 2022-10-17: qty 10

## 2022-10-17 MED ORDER — DEXAMETHASONE SODIUM PHOSPHATE 10 MG/ML IJ SOLN
INTRAMUSCULAR | Status: DC | PRN
  Administered 2022-10-17: 5 mg via INTRAVENOUS

## 2022-10-17 MED ORDER — OXYCODONE HCL 5 MG PO TABS: 5.0000 mg | ORAL_TABLET | Freq: Once | ORAL | Status: DC | PRN

## 2022-10-17 MED ORDER — LIDOCAINE 2% (20 MG/ML) 5 ML SYRINGE
INTRAMUSCULAR | Status: AC
  Filled 2022-10-17: qty 5

## 2022-10-17 MED ORDER — PROPOFOL 10 MG/ML IV BOLUS
INTRAVENOUS | Status: AC
  Filled 2022-10-17: qty 20

## 2022-10-17 MED ORDER — SUGAMMADEX SODIUM 200 MG/2ML IV SOLN
INTRAVENOUS | Status: DC | PRN
  Administered 2022-10-17: 200 mg via INTRAVENOUS

## 2022-10-17 MED ORDER — PHENYLEPHRINE 80 MCG/ML (10ML) SYRINGE FOR IV PUSH (FOR BLOOD PRESSURE SUPPORT)
PREFILLED_SYRINGE | INTRAVENOUS | Status: DC | PRN
  Administered 2022-10-17: 160 ug via INTRAVENOUS

## 2022-10-17 MED ORDER — CEFAZOLIN SODIUM-DEXTROSE 2-3 GM-%(50ML) IV SOLR
INTRAVENOUS | Status: DC | PRN
  Administered 2022-10-17: 2 g via INTRAVENOUS

## 2022-10-17 MED ORDER — PROPOFOL 10 MG/ML IV BOLUS
INTRAVENOUS | Status: DC | PRN
  Administered 2022-10-17: 150 mg via INTRAVENOUS

## 2022-10-17 MED ORDER — HEMOSTATIC AGENTS (NO CHARGE) OPTIME
TOPICAL | Status: DC | PRN
  Administered 2022-10-17: 2 via TOPICAL

## 2022-10-17 MED ORDER — DEXAMETHASONE SODIUM PHOSPHATE 10 MG/ML IJ SOLN
INTRAMUSCULAR | Status: AC
  Filled 2022-10-17: qty 1

## 2022-10-17 MED ORDER — LACTATED RINGERS IV SOLN: INTRAVENOUS | Status: DC | PRN

## 2022-10-17 MED ORDER — MEPERIDINE HCL 25 MG/ML IJ SOLN: 6.2500 mg | INTRAMUSCULAR | Status: DC | PRN

## 2022-10-17 MED ORDER — ONDANSETRON HCL 4 MG/2ML IJ SOLN
INTRAMUSCULAR | Status: AC
  Filled 2022-10-17: qty 2

## 2022-10-17 MED ORDER — ONDANSETRON HCL 4 MG/2ML IJ SOLN
INTRAMUSCULAR | Status: DC | PRN
  Administered 2022-10-17: 4 mg via INTRAVENOUS

## 2022-10-17 MED ORDER — ACETAMINOPHEN 325 MG PO TABS: 325.0000 mg | ORAL_TABLET | ORAL | Status: DC | PRN

## 2022-10-17 MED ORDER — 0.9 % SODIUM CHLORIDE (POUR BTL) OPTIME
TOPICAL | Status: DC | PRN
  Administered 2022-10-17: 1000 mL

## 2022-10-17 MED ORDER — FENTANYL CITRATE (PF) 250 MCG/5ML IJ SOLN
INTRAMUSCULAR | Status: AC
  Filled 2022-10-17: qty 5

## 2022-10-17 MED ORDER — ACETAMINOPHEN 160 MG/5ML PO SOLN: 325.0000 mg | ORAL | Status: DC | PRN

## 2022-10-17 SURGICAL SUPPLY — 28 items
BAG COUNTER SPONGE SURGICOUNT (BAG) ×1 IMPLANT
BAG SPNG CNTER NS LX DISP (BAG) ×1
CANISTER SUCT 3000ML PPV (MISCELLANEOUS) ×1 IMPLANT
CANISTER WOUNDNEG PRESSURE 500 (CANNISTER) IMPLANT
CLIP TI MEDIUM 6 (CLIP) IMPLANT
CLIP TI WIDE RED SMALL 6 (CLIP) IMPLANT
COVER SURGICAL LIGHT HANDLE (MISCELLANEOUS) ×2 IMPLANT
DRAPE INCISE IOBAN 66X45 STRL (DRAPES) IMPLANT
DRSG VERAFLO VAC MED (GAUZE/BANDAGES/DRESSINGS) IMPLANT
DRSG VERSA FOAM LRG 10X15 (GAUZE/BANDAGES/DRESSINGS) IMPLANT
ELECT REM PT RETURN 9FT ADLT (ELECTROSURGICAL) ×1
ELECTRODE REM PT RTRN 9FT ADLT (ELECTROSURGICAL) ×1 IMPLANT
GLOVE BIO SURGEON STRL SZ7.5 (GLOVE) ×1 IMPLANT
GLOVE BIOGEL PI IND STRL 8 (GLOVE) ×1 IMPLANT
GOWN STRL REUS W/ TWL LRG LVL3 (GOWN DISPOSABLE) ×2 IMPLANT
GOWN STRL REUS W/ TWL XL LVL3 (GOWN DISPOSABLE) ×2 IMPLANT
GOWN STRL REUS W/TWL LRG LVL3 (GOWN DISPOSABLE) ×2
GOWN STRL REUS W/TWL XL LVL3 (GOWN DISPOSABLE) ×2
KIT BASIN OR (CUSTOM PROCEDURE TRAY) ×1 IMPLANT
KIT TURNOVER KIT B (KITS) ×1 IMPLANT
NS IRRIG 1000ML POUR BTL (IV SOLUTION) ×1 IMPLANT
PACK CV ACCESS (CUSTOM PROCEDURE TRAY) ×1 IMPLANT
PAD ARMBOARD 7.5X6 YLW CONV (MISCELLANEOUS) ×2 IMPLANT
POWDER SURGICEL 3.0 GRAM (HEMOSTASIS) IMPLANT
SPONGE T-LAP 18X18 ~~LOC~~+RFID (SPONGE) IMPLANT
SUT ETHILON 3 0 PS 1 (SUTURE) IMPLANT
TOWEL GREEN STERILE (TOWEL DISPOSABLE) ×1 IMPLANT
WATER STERILE IRR 1000ML POUR (IV SOLUTION) ×1 IMPLANT

## 2022-10-17 NOTE — Progress Notes (Signed)
Referring Physician(s): Dr. Karie FetchLaura Clark  Supervising Physician: Marliss CootsSuttle, Dylan  [facility]  [facility]  Chief Complaint: Pulmonary embolus  Subjective: Patient sitting up in chair today s/p OR irrigation, debridement, and fasciotomy with vac change. Overall progressing.  Attempted to ambulate post-op today but with elevated HR which may be related to ansethesia.   HR during visit, at rest, 115 bpm.  No known issues r/t groin site.     Allergies: Patient has no known allergies.  Medications: Prior to Admission medications   Medication Sig Start Date End Date Taking? Authorizing Provider  allopurinol (ZYLOPRIM) 300 MG tablet Take 150 mg by mouth daily.    Yes [provider]  ascorbic acid (VITAMIN C) 500 MG tablet Take 500 mg by mouth daily.   Yes [provider]  Cholecalciferol (VITAMIN D3) 5000 units CAPS Take 2,000 Units by mouth daily with supper.    Yes [provider]  finasteride (PROSCAR) 5 MG tablet Take 5 mg by mouth at bedtime.    Yes [provider]  fluticasone (FLONASE) 50 MCG/ACT nasal spray Place 1 spray into both nostrils every evening.   Yes [provider]  magnesium gluconate (MAGONATE) 500 MG tablet Take 500 mg by mouth daily.   Yes [provider]  nitroGLYCERIN (NITROSTAT) 0.4 MG SL tablet Place 0.4 mg under the tongue every 5 (five) minutes as needed for chest pain.   Yes [provider]  Omega-3 Fatty Acids (FISH OIL) 1000 MG CAPS Take 1,000 mg by mouth daily.   Yes [provider]  omeprazole (PRILOSEC) 40 MG capsule Take 1 capsule (40 mg total) by mouth daily. 04/21/22  Yes Patwardhan, Manish J, MD  rosuvastatin (CRESTOR) 20 MG tablet Take 1 tablet (20 mg total) by mouth daily. 06/30/22 06/25/23 Yes Patwardhan, Manish J, MD  telmisartan (MICARDIS) 80 MG tablet Take 40 mg by mouth daily.   Yes [provider]  zinc gluconate 50 MG tablet Take 50 mg by mouth daily.    Yes [provider]     Vital Signs: BP 106/67   Pulse (!) 113   Temp 98.6 F (37 C)   Resp (!) 24   Ht 5' 9.5" (1.765 m)   Wt 205 lb 0.4 oz (93 kg)   SpO2 94%   BMI 29.84 kg/m   Physical Exam NAD, resting in bed. Skin: groin procedure site intact, clean, and dry.  No oozing.  Dressing replaced- thrombipad removed.   Imaging: VAS US UPPER EXTREMITY ARTERIAL DUPLEX  Result Date: 10/15/2022  UPPER EXTREMITY DUPLEX STUDY Patient Name:  Ralph Blackwell  Date of Exam:   10/15/2022 Medical Rec #: [id number]        Accession #:    [id number] Date of Birth: [birth year]        Patient Gender: M Patient Age:   [age] years [facility]  Procedure:      VAS US UPPER EXTREMITY ARTERIAL DUPLEX Referring Phys: Heath LarkHOMAS HAWKEN --------------------------------------------------------------------------------  Performing Technologist: Ernestene MentionJody Hill RVT, RDMS  Examination Guidelines: A complete evaluation includes B-mode imaging, spectral Doppler, color Doppler, and power Doppler as needed of all accessible portions of each vessel. Bilateral testing is considered an integral part of a complete examination. Limited examinations for reoccurring indications may be performed as noted.  Right Doppler Findings: +---------------+----------+-----------+--------+-----------------------------+ Site           PSV (cm/s)Waveform   StenosisComments                      +---------------+----------+-----------+--------+-----------------------------+  Subclavian Dist                                                           +---------------+----------+-----------+--------+-----------------------------+ Radial Prox                                 not visualized - IV placement +---------------+----------+-----------+--------+-----------------------------+ Radial Mid     53        multiphasic                                       +---------------+----------+-----------+--------+-----------------------------+ Radial Dist    105       multiphasic                                      +---------------+----------+-----------+--------+-----------------------------+ Ulnar Dist     45        triphasic                                        +---------------+----------+-----------+--------+-----------------------------+   An area with well defined borders measuring 2.4 cm x 2.0 cm was visualized arising off of the distal radial artery with ultrasound characteristics of a pseudoaneurysm. Mixed echos within the structure suggest that it is partially thrombosed with a residual diameter of 0.5 cm x 0.4 cm. Unable to visualize radial,ulnar, or brachial veins - possibly due to extrinsic compression from subcutaneous edema.  Electronically signed by Jamelle Haring on 10/15/2022 at 9:55:33 PM.    Final    IR THROMB F/U EVAL ART/VEN FINAL DAY (MS)  Result Date: 10/15/2022 INDICATION: 82 year old male status post thrombolysis for submassive pulmonary embolism EXAM: FOLLOW-UP PULMONARY ARTERY THROMBOLYSIS COMPARISON:  10/14/2022, CT 10/13/2022 MEDICATIONS: None ANESTHESIA/SEDATION: None FLUOROSCOPY: None COMPLICATIONS: None TECHNIQUE: Informed written consent was previously obtained from the patient after a thorough discussion of the procedural risks, benefits and alternatives. Bedside trans duction of both the left and the right pulmonary artery lysis catheters was attempted in the ICU. A confident waveform pressure could not be confirmed, with multiple troubleshooting steps. Ultimately, the catheters were withdrawn without obtaining a follow-up pulmonary artery pressure. Both lysis catheters were removed. Given the patient's operative status, the venous sheaths were left in place for central venous access. FINDINGS: No central venous pressure was obtained. IMPRESSION: Follow-up pulmonary artery thrombolysis as above. Electronically Signed   By:  Corrie Mckusick D.O.   On: 10/15/2022 17:00   VAS [place] LOWER EXTREMITY VENOUS (DVT)  Result Date: 10/14/2022  Lower Venous DVT Study Patient Name:  Ralph Blackwell  Date of Exam:   10/14/2022 Medical Rec #: [id number]        Accession #:    [id number] Date of Birth: 21-Sep-1940        Patient Gender: M Patient Age:   82 years Exam Location[facility]  [facility] Procedure:      VAS [place] LOWER EXTREMITY VENOUS (DVT) Referring Phys: Noe Gens --------------------------------------------------------------------------------  Indications: Pulmonary embolism.  Risk Factors: Confirmed PE. Anticoagulation: Heparin.  Limitations: Bandages, line and patient positioning, patient immobility. Comparison Study: No prior studies. Performing Technologist: Oliver Hum RVT  Examination Guidelines: A complete evaluation includes B-mode imaging, spectral Doppler, color Doppler, and power Doppler as needed of all accessible portions of each vessel. Bilateral testing is considered an integral part of a complete examination. Limited examinations for reoccurring indications may be performed as noted. The reflux portion of the exam is performed with the patient in reverse Trendelenburg.  +---------+---------------+---------+-----------+----------+--------------+ RIGHT    CompressibilityPhasicitySpontaneityPropertiesThrombus Aging +---------+---------------+---------+-----------+----------+--------------+ CFV      Full           Yes      Yes                                 +---------+---------------+---------+-----------+----------+--------------+ SFJ      Full                                                        +---------+---------------+---------+-----------+----------+--------------+ FV Prox  Full                                                        +---------+---------------+---------+-----------+----------+--------------+ FV Mid   Full                                                         +---------+---------------+---------+-----------+----------+--------------+ FV DistalFull                                                        +---------+---------------+---------+-----------+----------+--------------+ PFV      Full                                                        +---------+---------------+---------+-----------+----------+--------------+ POP      None           No       No                   Acute          +---------+---------------+---------+-----------+----------+--------------+ PTV      Full                                                        +---------+---------------+---------+-----------+----------+--------------+ PERO     Full                                                        +---------+---------------+---------+-----------+----------+--------------+  Gastroc  Partial                                      Acute          +---------+---------------+---------+-----------+----------+--------------+   +---------+---------------+---------+-----------+----------+--------------+ LEFT     CompressibilityPhasicitySpontaneityPropertiesThrombus Aging +---------+---------------+---------+-----------+----------+--------------+ CFV      Full           Yes      Yes                                 +---------+---------------+---------+-----------+----------+--------------+ SFJ      Full                                                        +---------+---------------+---------+-----------+----------+--------------+ FV Prox  Full                                                        +---------+---------------+---------+-----------+----------+--------------+ FV Mid   Full                                                        +---------+---------------+---------+-----------+----------+--------------+ FV DistalFull                                                         +---------+---------------+---------+-----------+----------+--------------+ PFV      Full                                                        +---------+---------------+---------+-----------+----------+--------------+ POP      Full           Yes      Yes                                 +---------+---------------+---------+-----------+----------+--------------+ PTV      Full                                                        +---------+---------------+---------+-----------+----------+--------------+ PERO     Full                                                        +---------+---------------+---------+-----------+----------+--------------+  Summary: RIGHT: - Findings consistent with acute deep vein thrombosis involving the right popliteal vein, and right gastrocnemius veins. - No cystic structure found in the popliteal fossa.  LEFT: - There is no evidence of deep vein thrombosis in the lower extremity.  - No cystic structure found in the popliteal fossa.  *See table(s) above for measurements and observations. Electronically signed by Deitra Mayo MD on 10/14/2022 at 4:22:02 PM.    Final    IR US Guide Vasc Access Right  Result Date: 10/14/2022 INDICATION: Submassive saddle pulmonary embolus, right heart strain, chest discomfort despite heparin therapy EXAM: ULTRASOUND GUIDANCE FOR VASCULAR ACCESS BILATERAL PULMONARY ARTERY CATHETERIZATIONS, ANGIOGRAMS, PRESSURE MEASUREMENT, AND INFUSION CATHETER PLACEMENTS FOR PE THROMBO LYSIS COMPARISON:  None Available. MEDICATIONS: 1% lidocaine local ANESTHESIA/SEDATION: Moderate (conscious) sedation was employed during this procedure. A total of Versed 1.5 mg and Fentanyl 50 mcg was administered intravenously by the radiology nurse. Total intra-service moderate Sedation Time: The 40 minutes. The patient's level of consciousness and vital signs were monitored continuously by radiology nursing throughout the procedure under my direct  supervision. FLUOROSCOPY: Radiation Exposure Index (as provided by the fluoroscopic device): Q000111Q mGy Kerma COMPLICATIONS: None immediate. TECHNIQUE: Informed written consent was obtained from the patient after a thorough discussion of the procedural risks, benefits and alternatives. All questions were addressed. Maximal Sterile Barrier Technique was utilized including caps, mask, sterile gowns, sterile gloves, sterile drape, hand hygiene and skin antiseptic. A timeout was performed prior to the initiation of the procedure. Under sterile conditions and local anesthesia, ultrasound micropuncture access performed the right common femoral vein twice. Adjacent 6 French sheath were inserted over Bentson guidewires into the right common femoral vein. Images obtained for documentation of the patent right common femoral vein. Five [place] PA catheters were advanced into the pulmonary arteries bilaterally utilizing both Bentson and 035 glide wires. Contrast injection performed for bilateral limited pulmonary angiography. Nearly occlusive central thrombus present on the right. More peripheral nonocclusive thrombus present on the left. Central PA pressure measurement: 42/15 (25). Over bilateral Rosen guidewires, the 90 cm infusion catheters with 10 cm infusion length were advanced into the pulmonary arteries. Internal occlusion wires advanced. Images obtained for documentation of catheter positions. Access secured with a silk suture. 12 hour thrombolysis protocol will be initiated through the bilateral pulmonary artery UniFuse catheters. FINDINGS: Imaging confirms right greater than left central pulmonary emboli better demonstrated by the comparison CTA. Successful placement of bilateral 90 cm infusion catheters with 10 cm infusion leaks into both pulmonary arteries. IMPRESSION: Successful placement of bilateral pulmonary artery infusion catheters for PE thrombolysis 12 hour protocol. Electronically Signed   By: Jerilynn Mages.  Shick M.D.    On: 10/14/2022 13:10   IR INFUSION THROMBOL ARTERIAL INITIAL (MS)  Result Date: 10/14/2022 INDICATION: Submassive saddle pulmonary embolus, right heart strain, chest discomfort despite heparin therapy EXAM: ULTRASOUND GUIDANCE FOR VASCULAR ACCESS BILATERAL PULMONARY ARTERY CATHETERIZATIONS, ANGIOGRAMS, PRESSURE MEASUREMENT, AND INFUSION CATHETER PLACEMENTS FOR PE THROMBO LYSIS COMPARISON:  None Available. MEDICATIONS: 1% lidocaine local ANESTHESIA/SEDATION: Moderate (conscious) sedation was employed during this procedure. A total of Versed 1.5 mg and Fentanyl 50 mcg was administered intravenously by the radiology nurse. Total intra-service moderate Sedation Time: The 40 minutes. The patient's level of consciousness and vital signs were monitored continuously by radiology nursing throughout the procedure under my direct supervision. FLUOROSCOPY: Radiation Exposure Index (as provided by the fluoroscopic device): Q000111Q mGy Kerma COMPLICATIONS: None immediate. TECHNIQUE: Informed written consent was obtained from the patient after a thorough discussion  of the procedural risks, benefits and alternatives. All questions were addressed. Maximal Sterile Barrier Technique was utilized including caps, mask, sterile gowns, sterile gloves, sterile drape, hand hygiene and skin antiseptic. A timeout was performed prior to the initiation of the procedure. Under sterile conditions and local anesthesia, ultrasound micropuncture access performed the right common femoral vein twice. Adjacent 6 French sheath were inserted over Bentson guidewires into the right common femoral vein. Images obtained for documentation of the patent right common femoral vein. Five [place] PA catheters were advanced into the pulmonary arteries bilaterally utilizing both Bentson and 035 glide wires. Contrast injection performed for bilateral limited pulmonary angiography. Nearly occlusive central thrombus present on the right. More peripheral nonocclusive  thrombus present on the left. Central PA pressure measurement: 42/15 (25). Over bilateral Rosen guidewires, the 90 cm infusion catheters with 10 cm infusion length were advanced into the pulmonary arteries. Internal occlusion wires advanced. Images obtained for documentation of catheter positions. Access secured with a silk suture. 12 hour thrombolysis protocol will be initiated through the bilateral pulmonary artery UniFuse catheters. FINDINGS: Imaging confirms right greater than left central pulmonary emboli better demonstrated by the comparison CTA. Successful placement of bilateral 90 cm infusion catheters with 10 cm infusion leaks into both pulmonary arteries. IMPRESSION: Successful placement of bilateral pulmonary artery infusion catheters for PE thrombolysis 12 hour protocol. Electronically Signed   By: Jerilynn Mages.  Shick M.D.   On: 10/14/2022 13:10   IR INFUSION THROMBOL ARTERIAL INITIAL (MS)  Result Date: 10/14/2022 INDICATION: Submassive saddle pulmonary embolus, right heart strain, chest discomfort despite heparin therapy EXAM: ULTRASOUND GUIDANCE FOR VASCULAR ACCESS BILATERAL PULMONARY ARTERY CATHETERIZATIONS, ANGIOGRAMS, PRESSURE MEASUREMENT, AND INFUSION CATHETER PLACEMENTS FOR PE THROMBO LYSIS COMPARISON:  None Available. MEDICATIONS: 1% lidocaine local ANESTHESIA/SEDATION: Moderate (conscious) sedation was employed during this procedure. A total of Versed 1.5 mg and Fentanyl 50 mcg was administered intravenously by the radiology nurse. Total intra-service moderate Sedation Time: The 40 minutes. The patient's level of consciousness and vital signs were monitored continuously by radiology nursing throughout the procedure under my direct supervision. FLUOROSCOPY: Radiation Exposure Index (as provided by the fluoroscopic device): Q000111Q mGy Kerma COMPLICATIONS: None immediate. TECHNIQUE: Informed written consent was obtained from the patient after a thorough discussion of the procedural risks, benefits and  alternatives. All questions were addressed. Maximal Sterile Barrier Technique was utilized including caps, mask, sterile gowns, sterile gloves, sterile drape, hand hygiene and skin antiseptic. A timeout was performed prior to the initiation of the procedure. Under sterile conditions and local anesthesia, ultrasound micropuncture access performed the right common femoral vein twice. Adjacent 6 French sheath were inserted over Bentson guidewires into the right common femoral vein. Images obtained for documentation of the patent right common femoral vein. Five [place] PA catheters were advanced into the pulmonary arteries bilaterally utilizing both Bentson and 035 glide wires. Contrast injection performed for bilateral limited pulmonary angiography. Nearly occlusive central thrombus present on the right. More peripheral nonocclusive thrombus present on the left. Central PA pressure measurement: 42/15 (25). Over bilateral Rosen guidewires, the 90 cm infusion catheters with 10 cm infusion length were advanced into the pulmonary arteries. Internal occlusion wires advanced. Images obtained for documentation of catheter positions. Access secured with a silk suture. 12 hour thrombolysis protocol will be initiated through the bilateral pulmonary artery UniFuse catheters. FINDINGS: Imaging confirms right greater than left central pulmonary emboli better demonstrated by the comparison CTA. Successful placement of bilateral 90 cm infusion catheters with 10 cm infusion leaks into both pulmonary  arteries. IMPRESSION: Successful placement of bilateral pulmonary artery infusion catheters for PE thrombolysis 12 hour protocol. Electronically Signed   By: Jerilynn Mages.  Shick M.D.   On: 10/14/2022 13:10   IR Angiogram Pulmonary Bilateral Selective  Result Date: 10/14/2022 INDICATION: Submassive saddle pulmonary embolus, right heart strain, chest discomfort despite heparin therapy EXAM: ULTRASOUND GUIDANCE FOR VASCULAR ACCESS BILATERAL PULMONARY  ARTERY CATHETERIZATIONS, ANGIOGRAMS, PRESSURE MEASUREMENT, AND INFUSION CATHETER PLACEMENTS FOR PE THROMBO LYSIS COMPARISON:  None Available. MEDICATIONS: 1% lidocaine local ANESTHESIA/SEDATION: Moderate (conscious) sedation was employed during this procedure. A total of Versed 1.5 mg and Fentanyl 50 mcg was administered intravenously by the radiology nurse. Total intra-service moderate Sedation Time: The 40 minutes. The patient's level of consciousness and vital signs were monitored continuously by radiology nursing throughout the procedure under my direct supervision. FLUOROSCOPY: Radiation Exposure Index (as provided by the fluoroscopic device): Q000111Q mGy Kerma COMPLICATIONS: None immediate. TECHNIQUE: Informed written consent was obtained from the patient after a thorough discussion of the procedural risks, benefits and alternatives. All questions were addressed. Maximal Sterile Barrier Technique was utilized including caps, mask, sterile gowns, sterile gloves, sterile drape, hand hygiene and skin antiseptic. A timeout was performed prior to the initiation of the procedure. Under sterile conditions and local anesthesia, ultrasound micropuncture access performed the right common femoral vein twice. Adjacent 6 French sheath were inserted over Bentson guidewires into the right common femoral vein. Images obtained for documentation of the patent right common femoral vein. Five [place] PA catheters were advanced into the pulmonary arteries bilaterally utilizing both Bentson and 035 glide wires. Contrast injection performed for bilateral limited pulmonary angiography. Nearly occlusive central thrombus present on the right. More peripheral nonocclusive thrombus present on the left. Central PA pressure measurement: 42/15 (25). Over bilateral Rosen guidewires, the 90 cm infusion catheters with 10 cm infusion length were advanced into the pulmonary arteries. Internal occlusion wires advanced. Images obtained for  documentation of catheter positions. Access secured with a silk suture. 12 hour thrombolysis protocol will be initiated through the bilateral pulmonary artery UniFuse catheters. FINDINGS: Imaging confirms right greater than left central pulmonary emboli better demonstrated by the comparison CTA. Successful placement of bilateral 90 cm infusion catheters with 10 cm infusion leaks into both pulmonary arteries. IMPRESSION: Successful placement of bilateral pulmonary artery infusion catheters for PE thrombolysis 12 hour protocol. Electronically Signed   By: Jerilynn Mages.  Shick M.D.   On: 10/14/2022 13:10   IR Angiogram Selective Each Additional Vessel  Result Date: 10/14/2022 INDICATION: Submassive saddle pulmonary embolus, right heart strain, chest discomfort despite heparin therapy EXAM: ULTRASOUND GUIDANCE FOR VASCULAR ACCESS BILATERAL PULMONARY ARTERY CATHETERIZATIONS, ANGIOGRAMS, PRESSURE MEASUREMENT, AND INFUSION CATHETER PLACEMENTS FOR PE THROMBO LYSIS COMPARISON:  None Available. MEDICATIONS: 1% lidocaine local ANESTHESIA/SEDATION: Moderate (conscious) sedation was employed during this procedure. A total of Versed 1.5 mg and Fentanyl 50 mcg was administered intravenously by the radiology nurse. Total intra-service moderate Sedation Time: The 40 minutes. The patient's level of consciousness and vital signs were monitored continuously by radiology nursing throughout the procedure under my direct supervision. FLUOROSCOPY: Radiation Exposure Index (as provided by the fluoroscopic device): Q000111Q mGy Kerma COMPLICATIONS: None immediate. TECHNIQUE: Informed written consent was obtained from the patient after a thorough discussion of the procedural risks, benefits and alternatives. All questions were addressed. Maximal Sterile Barrier Technique was utilized including caps, mask, sterile gowns, sterile gloves, sterile drape, hand hygiene and skin antiseptic. A timeout was performed prior to the initiation of the procedure. Under  sterile conditions and local  anesthesia, ultrasound micropuncture access performed the right common femoral vein twice. Adjacent 6 French sheath were inserted over Bentson guidewires into the right common femoral vein. Images obtained for documentation of the patent right common femoral vein. Five [place] PA catheters were advanced into the pulmonary arteries bilaterally utilizing both Bentson and 035 glide wires. Contrast injection performed for bilateral limited pulmonary angiography. Nearly occlusive central thrombus present on the right. More peripheral nonocclusive thrombus present on the left. Central PA pressure measurement: 42/15 (25). Over bilateral Rosen guidewires, the 90 cm infusion catheters with 10 cm infusion length were advanced into the pulmonary arteries. Internal occlusion wires advanced. Images obtained for documentation of catheter positions. Access secured with a silk suture. 12 hour thrombolysis protocol will be initiated through the bilateral pulmonary artery UniFuse catheters. FINDINGS: Imaging confirms right greater than left central pulmonary emboli better demonstrated by the comparison CTA. Successful placement of bilateral 90 cm infusion catheters with 10 cm infusion leaks into both pulmonary arteries. IMPRESSION: Successful placement of bilateral pulmonary artery infusion catheters for PE thrombolysis 12 hour protocol. Electronically Signed   By: Jerilynn Mages.  Shick M.D.   On: 10/14/2022 13:10   IR Angiogram Selective Each Additional Vessel  Result Date: 10/14/2022 INDICATION: Submassive saddle pulmonary embolus, right heart strain, chest discomfort despite heparin therapy EXAM: ULTRASOUND GUIDANCE FOR VASCULAR ACCESS BILATERAL PULMONARY ARTERY CATHETERIZATIONS, ANGIOGRAMS, PRESSURE MEASUREMENT, AND INFUSION CATHETER PLACEMENTS FOR PE THROMBO LYSIS COMPARISON:  None Available. MEDICATIONS: 1% lidocaine local ANESTHESIA/SEDATION: Moderate (conscious) sedation was employed during this procedure.  A total of Versed 1.5 mg and Fentanyl 50 mcg was administered intravenously by the radiology nurse. Total intra-service moderate Sedation Time: The 40 minutes. The patient's level of consciousness and vital signs were monitored continuously by radiology nursing throughout the procedure under my direct supervision. FLUOROSCOPY: Radiation Exposure Index (as provided by the fluoroscopic device): [id number] mGy Kerma COMPLICATIONS: None immediate. TECHNIQUE: Informed written consent was obtained from the patient after a thorough discussion of the procedural risks, benefits and alternatives. All questions were addressed. Maximal Sterile Barrier Technique was utilized including caps, mask, sterile gowns, sterile gloves, sterile drape, hand hygiene and skin antiseptic. A timeout was performed prior to the initiation of the procedure. Under sterile conditions and local anesthesia, ultrasound micropuncture access performed the right common femoral vein twice. Adjacent 6 French sheath were inserted over Bentson guidewires into the right common femoral vein. Images obtained for documentation of the patent right common femoral vein. Five [place] PA catheters were advanced into the pulmonary arteries bilaterally utilizing both Bentson and 035 glide wires. Contrast injection performed for bilateral limited pulmonary angiography. Nearly occlusive central thrombus present on the right. More peripheral nonocclusive thrombus present on the left. Central PA pressure measurement: 42/15 (25). Over bilateral Rosen guidewires, the 90 cm infusion catheters with 10 cm infusion length were advanced into the pulmonary arteries. Internal occlusion wires advanced. Images obtained for documentation of catheter positions. Access secured with a silk suture. 12 hour thrombolysis protocol will be initiated through the bilateral pulmonary artery UniFuse catheters. FINDINGS: Imaging confirms right greater than left central pulmonary emboli better demonstrated  by the comparison CTA. Successful placement of bilateral 90 cm infusion catheters with 10 cm infusion leaks into both pulmonary arteries. IMPRESSION: Successful placement of bilateral pulmonary artery infusion catheters for PE thrombolysis 12 hour protocol. Electronically Signed   By: Jerilynn Mages.  Shick M.D.   On: 10/14/2022 13:10   CT Angio Chest Pulmonary Embolism (PE) W or WO Contrast  Addendum Date: 10/13/2022  ADDENDUM REPORT: 10/13/2022 15:59 ADDENDUM: Critical Value/emergent results were called by telephone at the time of interpretation on 10/13/2022 at 3:59 pm to [facility] , who verbally acknowledged these results. Electronically Signed   By: Amie Portlandavid  Ormond M.D.   On: 10/13/2022 15:59   Result Date: 10/13/2022 CLINICAL DATA:  Unstable angina. Inpatient. Clinical suspicion for pulmonary embolism. EXAM: CT ANGIOGRAPHY CHEST WITH CONTRAST TECHNIQUE: Multidetector CT imaging of the chest was performed using the standard protocol during bolus administration of intravenous contrast. Multiplanar CT image reconstructions and MIPs were obtained to evaluate the vascular anatomy. RADIATION DOSE REDUCTION: This exam was performed according to the departmental dose-optimization program which includes automated exposure control, adjustment of the mA and/or kV according to patient size and/or use of iterative reconstruction technique. CONTRAST:  75mL OMNIPAQUE IOHEXOL 350 MG/ML SOLN COMPARISON:  01/25/2013.  Chest radiograph, 10/11/2022. FINDINGS: Cardiovascular:  Pulmonary arteries are well opacified. There are bilateral pulmonary emboli, greater on the right. A saddle embolus spans the right and left pulmonary arteries. Near occlusive emboli extend into the right lower lobe segmental pulmonary arteries. Nonocclusive emboli are noted in the right middle and upper lobe central pulmonary arteries. On the left nonocclusive pulmonary embolism extends from the saddle embolism into the central lingular branch pulmonary  artery. RV LV ratio is 1.18. Heart normal in size. No pericardial effusion. Great vessels are normal in caliber. Mild aortic atherosclerosis. No dissection. Mediastinum/Nodes: Significant enlargement of the left thyroid lobe that extends substernally. This represents a notable change from the chest CT dated 01/25/2013. No other mediastinal masses. No hilar masses. No mediastinal or hilar adenopathy. Trachea and esophagus are unremarkable. Lungs/Pleura: Minor basilar subsegmental atelectasis. Lungs otherwise clear. No pleural effusion or pneumothorax. Upper Abdomen: No acute finding. Musculoskeletal: No fracture or acute finding. No bone lesion. No chest wall mass. Review of the MIP images confirms the above findings. IMPRESSION: 1. Positive for pulmonary emboli. Right greater than left pulmonary emboli with a saddle pulmonary embolus as detailed. Positive for acute PE with CT evidence of right heart strain (RV/LV Ratio = 1.18) consistent with at least submassive (intermediate risk) PE. The presence of right heart strain has been associated with an increased risk of morbidity and mortality. Please refer to the "Code PE Focused" order set in EPIC. 2. No other acute finding. 3. Significant enlargement of the left thyroid lobe that extends substernally. Recommend thyroid ultrasound (ref: J Am Coll Radiol. 2015 Feb;12(2): 143-50). Aortic Atherosclerosis (ICD10-I70.0). Electronically Signed: By: Amie Portlandavid  Ormond M.D. On: 10/13/2022 15:50    Labs:  CBC: Recent Labs    10/15/22 1231 10/15/22 2120 10/16/22 0140 10/17/22 1021  WBC 9.6 8.7 8.0 12.7*  HGB 13.5 11.1* 11.2* 9.6*  HCT 40.9 34.3* 34.0* 29.2*  PLT 56* 78* 79* 120*     COAGS: Recent Labs    10/15/22 1226 10/15/22 2120  INR 1.3* 1.3*  APTT 109* 105*     BMP: Recent Labs    10/15/22 0622 10/15/22 1231 10/16/22 0140 10/17/22 1021  NA 135 133* 131* 135  K 4.4 4.3 4.6 4.8  CL 103 98 103 101  CO2 22 23 22 24   GLUCOSE 117* 140* 183* 136*   BUN 18 23 25* 34*  CALCIUM 8.6* 8.8* 8.5* 8.9  CREATININE 1.03 1.15 1.05 1.21  GFRNONAA >60 >60 >60 >60     LIVER FUNCTION TESTS: Recent Labs    10/11/22 1823  BILITOT 0.9  AST 27  ALT 24  ALKPHOS 64  PROT 6.4*  ALBUMIN 3.4*     Assessment and Plan: Pulmonary embolus s/p PE lysis 4/2 Patient s/p catheter directed lysis via right fem approach.  Treatment was limited by development of compartment syndrome in the right hand.  S/p OR for fasciotomy today.  Did have and elevated HR today with ambulation with therapy which may be anesthesia related. No changes in cariopulmonary complaint or exam. Conversing without ShOB. HR as rest has come down to 115.   IR remains available as needed.   Electronically Signed: Hoyt Koch, PA 10/17/2022, 3:27 PM   I spent a total of 15 Minutes at the the patient's bedside AND on the patient's hospital floor or unit, greater than 50% of which was counseling/coordinating care for pulmonary embolus.

## 2022-10-17 NOTE — Progress Notes (Signed)
At change of shift. The previous shift RN was checking pulses on right extremity of the pt after the wife expressed concern with increased swelling. Doppler pulses present and per the previous nurse, were the same as before. The pt's wife reports that she is worried and would like to speak to vascular MD. This RN reports that we will page then. Pt denies pain or tightness to the extremity. The extremity is elevated above his heart and the pt is exercising his hand as instructed to by MD without issue.   Vascular MD paged with a return call to this nurse. This RN informed MD of wife's concern and MD reported that he could talk to the pt's wife via telephone. The pt's wife discussed concerns with MD who answered her questions. The pt's wife feels better after speaking to the MD. This RN again assessed the pt's extremity with no changes noted and doppler pulses present. The pt continues to have no pain or tightness. VSS.

## 2022-10-17 NOTE — Evaluation (Signed)
Physical Therapy Evaluation Patient Details Name: Ralph Blackwell MRN: [id number] DOB: March 23, 1941 Today's Date: 10/17/2022  History of Present Illness  82 year old male that underwent right transradial cath with cardiology on 10/13/22.   Ultimately was found to have PEs and underwent thrombolysis and then developed right radial pseudoaneurysm with hematoma and compartment syndrome.  He underwent evacuation of the hematoma of his forearm with fasciotomies including carpal tunnel release and repair of the pseudoaneurysm. Underwent wash out and vac change 10/17/22. PMH: CAD, HTN, PE, OSA, renal insufficiency, GERD, Gout, Hiatal Hernia.  Clinical Impression  PTA pt living with wife in multistory home with bed and bath on main level. Pt completely independent. Pt is currently limited in safe mobility by increased cardiac response to movement, in presence of R UE compartment syndrome and fasciotomies. Pt currently requires min guard for bed mobility, min A for transfers and min guard for ambulation with EVA walker to keep R arm elevated. Pt will likely not require any therapy with discharge, however PT will continue to follow acutely to progress safe mobility.        Recommendations for follow up therapy are one component of a multi-disciplinary discharge planning process, led by the attending physician.  Recommendations may be updated based on patient status, additional functional criteria and insurance authorization.     Assistance Recommended at Discharge Frequent or constant Supervision/Assistance  Patient can return home with the following  A little help with bathing/dressing/bathroom;Assistance with cooking/housework;Assist for transportation    Equipment Recommendations None recommended by PT  Recommendations for Other Services       Functional Status Assessment Patient has had a recent decline in their functional status and demonstrates the ability to make significant improvements in function in a  reasonable and predictable amount of time.     Precautions / Restrictions Precautions Precaution Comments: R arm wound vac      Mobility  Bed Mobility Overal bed mobility: Needs Assistance Bed Mobility: Supine to Sit     Supine to sit: Min guard     General bed mobility comments: min guard for safety, vc for scooting hips to EoB    Transfers Overall transfer level: Needs assistance Equipment used:  (EVA walker) Transfers: Sit to/from Stand Sit to Stand: Min assist           General transfer comment: light min A for power up and management of UE to EVA walker, pt HR to 150s with standing and sustained in 140s    Ambulation/Gait Ambulation/Gait assistance: Min guard Gait Distance (Feet): 12 Feet Assistive device: Fara Boros Gait Pattern/deviations: Step-through pattern, Decreased step length - right, Decreased step length - left Gait velocity: slowed Gait velocity interpretation: <1.8 ft/sec, indicate of risk for recurrent falls   General Gait Details: min guard for safety and mangement of equiptment with slow, steady ambulation to recliner, ambulation limited due to HR in 150s      Balance Overall balance assessment: Modified Independent                                           Pertinent Vitals/Pain Pain Assessment Pain Assessment: 0-10 Pain Score: 2  Pain Location: R forearm and hand Pain Descriptors / Indicators: Dull, Throbbing Pain Intervention(s): Limited activity within patient's tolerance, Monitored during session, Repositioned    Home Living Family/patient expects to be discharged to:: Private residence Living Arrangements: Spouse/significant other  Available Help at Discharge: Family Type of Home: House Home Access: Stairs to enter   Entrance Stairs-Number of Steps: 1 Alternate Level Stairs-Number of Steps: 15 (office is upstairs) Home Layout: Two level;Able to live on main level with bedroom/bathroom Home Equipment: Cane -  single point;Hand held shower head;Grab bars - tub/shower;Shower seat - built Magazine features editorin;BSC/3in1;Rolling Walker (2 wheels)      Prior Function Prior Level of Function : Independent/Modified Independent;Driving (walks 2 miles a day at baseline)                     Hand Dominance   Dominant Hand: Right    Extremity/Trunk Assessment   Upper Extremity Assessment Upper Extremity Assessment: Defer to OT evaluation    Lower Extremity Assessment Lower Extremity Assessment: Overall WFL for tasks assessed    Cervical / Trunk Assessment Cervical / Trunk Assessment: Normal  Communication      Cognition Arousal/Alertness: Awake/alert Behavior During Therapy: WFL for tasks assessed/performed                                            General Comments General comments (skin integrity, edema, etc.): HR in 90s supine in bed, with coming to standing HR increased to 150s and sustained in 140s,        Assessment/Plan    PT Assessment Patient needs continued PT services  PT Problem List Cardiopulmonary status limiting activity;Decreased skin integrity       PT Treatment Interventions DME instruction;Gait training;Stair training;Functional mobility training;Therapeutic activities;Therapeutic exercise;Balance training;Cognitive remediation;Patient/family education    PT Goals (Current goals can be found in the Care Plan section)  Acute Rehab PT Goals Patient Stated Goal: get back to walking and yard work PT Goal Formulation: With patient/family Time For Goal Achievement: 10/31/22 Potential to Achieve Goals: Good    Frequency Min 1X/week        AM-PAC PT "6 Clicks" Mobility  Outcome Measure Help needed turning from your back to your side while in a flat bed without using bedrails?: None Help needed moving from lying on your back to sitting on the side of a flat bed without using bedrails?: None Help needed moving to and from a bed to a chair (including a  wheelchair)?: A Little Help needed standing up from a chair using your arms (e.g., wheelchair or bedside chair)?: A Little Help needed to walk in hospital room?: A Little Help needed climbing 3-5 steps with a railing? : A Little 6 Click Score: 20    End of Session Equipment Utilized During Treatment: Gait belt;Oxygen Activity Tolerance: Treatment limited secondary to medical complications (Comment) (increased HR) Patient left: in chair;with call bell/phone within reach;with family/visitor present;with nursing/sitter in room Nurse Communication: Mobility status;Other (comment) (increase in HR with movement) PT Visit Diagnosis: Difficulty in walking, not elsewhere classified (R26.2)    Time: [id number] PT Time Calculation (min) (ACUTE ONLY): 30 min   Charges:   PT Evaluation $PT Eval Moderate Complexity: 1 Mod          Anagabriela Jokerst B. Beverely RisenVan Fleet PT, DPT Acute Rehabilitation Services Please use secure chat or  Call Office [phone]   Elon Alaslizabeth B Van [facility] 10/17/2022, 4:34 PM

## 2022-10-17 NOTE — Transfer of Care (Signed)
Immediate Anesthesia Transfer of Care Note  Patient: Ralph Blackwell  Procedure(s) Performed: IRRIGATION AND DEBRIDEMENT RIGHT ARM & WOUND VAC CHANGE (Right)  Patient Location: PACU  Anesthesia Type:General  Level of Consciousness: awake, alert , and oriented  Airway & Oxygen Therapy: Patient Spontanous Breathing and Patient connected to nasal cannula oxygen  Post-op Assessment: Report given to RN and Post -op Vital signs reviewed and stable  Post vital signs: Reviewed and stable  Last Vitals:  Vitals Value Taken Time  BP 133/81 10/17/22 0909  Temp    Pulse 78 10/17/22 0912  Resp 12 10/17/22 0912  SpO2 94 % 10/17/22 0912  Vitals shown include unvalidated device data.  Last Pain:  Vitals:   10/17/22 0600  TempSrc:   PainSc: 0-No pain      Patients Stated Pain Goal: 0 (10/15/22 2000)  Complications: No notable events documented.

## 2022-10-17 NOTE — Progress Notes (Signed)
OT Cancellation Note  Patient Details Name: Ralph Blackwell MRN: [id number] DOB: 04-Sep-1940   Cancelled Treatment:    Reason Eval/Treat Not Completed: Patient at procedure or test/ unavailable (Pt is off the floor for surgical debridement.)  [facility] 10/17/2022, 9:23 AM Luisa Dago, OT/L   Acute OT Clinical Specialist Acute Rehabilitation Services Pager [phone] Office [phone]

## 2022-10-17 NOTE — Progress Notes (Signed)
ANTICOAGULATION CONSULT NOTE  Pharmacy Consult for Heparin Indication: chest pain/ACS  No Known Allergies  Patient Measurements: Height: 5' 9.5" (176.5 cm) Weight: 93 kg (205 lb 0.4 oz) IBW/kg (Calculated) : 71.85 Heparin Dosing Weight: 90.8 kg  Vital Signs: Temp: 98.6 F (37 C) (04/05 1136) Temp Source: Oral (04/05 1100) BP: 134/77 (04/05 1300) Pulse Rate: 87 (04/05 1300)  Labs: Recent Labs    10/15/22 0622 10/15/22 1226 10/15/22 1231 10/15/22 2120 10/16/22 0140 10/16/22 1801 10/17/22 1021  HGB 13.4  --  13.5 11.1* 11.2*  --  9.6*  HCT 40.5  --  40.9 34.3* 34.0*  --  29.2*  PLT 52*  --  56* 78* 79*  --  120*  APTT  --  109*  --  105*  --   --   --   LABPROT  --  15.6*  --  15.8*  --   --   --   INR  --  1.3*  --  1.3*  --   --   --   HEPARINUNFRC 0.54  --   --  0.37 0.55 0.41 0.45  CREATININE 1.03  --  1.15  --  1.05  --  1.21  TROPONINIHS 86*  --   --   --   --   --   --      Estimated Creatinine Clearance: 56.2 mL/min (by C-G formula based on SCr of 1.21 mg/dL).   Assessment: 43 yoM admitted with CP with clean cath. CT chest showed PE. Pt started on IV heparin. IR placed bilateral PE lysis catheters 4/2 x12h and heparin continued. Pt with compartment syndrome and taken to OR for fasciotomy 4/3, heparin resumed postop.  Heparin level 0.45 units/mL (therapeutic) on heparin drip rate 1000 units/hr. Hgb down loghtly  - watch no overt bleeding PLTC improved 120s  Goal of Therapy:  Heparin level 0.3-0.5 units/ml  Monitor platelets by anticoagulation protocol: Yes   Plan:  Continue heparin 1000 units/hr Daily heparin level and CBC   Leota Sauers Pharm.D. CPP, BCPS Clinical Pharmacist [phone] 10/17/2022 1:48 PM

## 2022-10-17 NOTE — Progress Notes (Signed)
NAME:  Ralph Blackwell, MRN:  [id number], DOB:  08-30-1940, LOS: 5 ADMISSION DATE:  10/11/2022, CONSULTATION DATE:  10/13/2022 REFERRING MD:  Dr. Jacqulyn Bath, CHIEF COMPLAINT:  SOB, Chest Pain   History of Present Illness:  82 year old man who presented to [facility] ED 3/30 with progressive SOB over several weeks.  The patient reports they traveled approximately 3 weeks ago to the [place].  While on vacation he walked 2 miles per day.  He typically is normally very active.  He notes when he returned home he was taking his dog out to go to to the bathroom and he developed increasing shortness of breath and chest tightness.  He describes it as a "vice on his chest". He states he did not feel like he was good to be able to make it back into the house.  He attempted sublingual nitro and he did have some improvement in symptoms.  Noting chest pain radiated to both arms.  He was evaluated by Cardiology on presentation and was thought to have unstable angina.  In route with EMS the patient was reportedly very diaphoretic and short of breath.  He was started on nitro and heparin infusion.  He continued to have chest pain with shortness of breath with minimal activity such as brushing teeth.  He was taken for left heart cath on 4/1 with findings of no angiographically evident significant coronary artery disease.  No acute findings to suggest cause of patient's chest pain.  Prior echocardiogram performed approximately a week before admission showed an RVSP of 28 mmHg.  Repeat ECHO this admission showed RVSP of 69 mmHg.  The patient subsequently had a CT angio of the chest which was positive for saddle PE with right greater than left pulmonary emboli, noted CT evidence of right heart strain with RV LV ratio of 1.18 consistent with at least submassive PE.  Patient was evaluated by interventional radiology.  PCCM consulted for evaluation   Pertinent Medical History:  CAD HTN Pulmonary embolism-2008 after knee surgery,  subsequent fall with wrist fracture OSA Renal insufficiency GERD Gout Hiatal hernia  Significant Hospital Events: Including procedures, antibiotic start and stop dates in addition to other pertinent events   3/30 admit with SOB, chest pain 4/1 LHC with no angiographically evident coronary artery disease to suggest cause of chest pain.  Noted elevated RVSP on echo. CTA completed & positive for saddle PE. IR/PCCM consulted.  4/2 hemodynamically stable, though had an episode of CP this morning 4/3 Acute onset of RUE hand/wrist/forearm swelling, c/f compartment syndrome. VVS/Hand Surgery consulted. Plan for OR for fasciotomy. 4/4 Escalating hand pain late morning with cold fingers, loss of pleth, loss of palpable pulse/doppler only. Dressing loosened with some relief. Later in afternoon, ambulating with continued improvement in swelling. 4/5 OR with VVS for washout/WV replacement and hematoma evacuation, tolerated well. Surgicel placed in wound bed.  Interim History / Subjective:  WV stopped working overnight, otherwise no significant events Taken to OR today for washout/WV change Hematoma evacuated, no significant bleeding just oozing in wound bed WV replaced by VVS, unable to close due to tension Ambulated briefly this afternoon, feeling well overall but episode of tachycardia to 130s-140s terminated ambulation Up in chair and appetite is better Encouraged PO fluid intake, avoiding IV fluids as able  Objective:  Blood pressure 128/79, pulse (!) 146, temperature 98.6 F (37 C), resp. rate 15, height 5' 9.5" (1.765 m), weight 93 kg, SpO2 92 %.        Intake/Output Summary (  Last 24 hours) at 10/17/2022 1453 Last data filed at 10/17/2022 1400 Gross per 24 hour  Intake 1108.92 ml  Output 4325 ml  Net -3216.08 ml    Filed Weights   10/11/22 1821 10/14/22 0300 10/15/22 1417  Weight: 93 kg 92.4 kg 93 kg   Physical Examination: General: Acutely ill-appearing elderly man in NAD. Sitting  in chair, pleasant and conversant. HEENT: [place]/AT, anicteric sclera, PERRL, moist mucous membranes. Neuro: Awake, oriented x 4. Responds to verbal stimuli. Following commands consistently. Moves all 4 extremities spontaneously. CV: Tachycardic to 110s, regular rhythm, no m/g/r. PULM: Breathing even and unlabored on 2LNC. Lung fields CTAB, improved aeration of lower lung fields. GI: Soft, nontender, nondistended. Normoactive bowel sounds. Extremities: Trace bilateral symmetric LE edema noted. LUE with scattered ecchymosis, no edema. RUE with severe edema of fingers/hand and R forearm with ecchymosis; WV in place with both black/white foam (per VVS Op note), dark sanguinous output (Surgicel also can produce brown output). Skin: Warm/dry, no rashes. Edema and ecchymosis of RUE as described above.  Resolved Hospital Problem List:     Assessment & Plan:   Acute submassive pulmonary embolism causing acute hypoxic respiratory failure; s/p catheter directed thrombolytics Chest pain / dyspnea secondary to Above  Hx Prior PE (2008, provoked after surgery) Had persistent intermittent chest pressure despite heparin for several days.  - Continue heparin gtt - VVS/hand Surgery ok with continuing despite persistent forearm hematoma - Supplemental O2 to maintain SpO2 > 90%, down to [facility]2LNC today - Pulmonary hygiene, encourage OOB/ambulation  Compartment syndrome of RUE, s/p volar compartment release 4/3 R radial artery pseudoaneurysm S/p washout/WV change 4/5 - VVS and Hand Surgery following, appreciate assistance with management - POD#0 from washout/WV change/hematoma evaluation, tolerated well - Surgicel powder placed for hemostasis, darkened WV output c/w this - Keep extremity elevated, compressive dressing as tolerated - Monitor WV output - Pain control with APAP, oxycodone, Fentanyl PRN - Frequent pulse checks (doppler if needed) - Plan for return to OR 4/8 for additional washout, possible closure  vs. WV change  Chronic thrombocytopenia  Mild ABLA - Trend H&H, Plt - Monitor for signs of active bleeding - Transfuse for Hgb < 7.0 or hemodynamically significant bleeding - S/p 1U Plt 4/4, improved  CAD  Mixed Hyperlipidemia  - Continue statin - Hold metoprolol/irbesartan to ensure adequate hand perfusion  GERD  - PPI  Hyponatremia - Avoid hypotonic fluids - Trend BMP, Na  Hyperglycemia - SSI - CBGs Q4H - Goal CBG 140-180  BPH  - Continue Proscar  Best Practice: (right click and "Reselect all SmartList Selections" daily)  Diet/type: Regular consistency (see orders) DVT prophylaxis: systemic heparin GI prophylaxis: PPI Lines: N/A Foley:  N/A Code Status:  full code Last date of multidisciplinary goals of care discussion: full code, family updated at the bedside 4/5  Critical care time: 35 minutes   This patient is critically ill with multiple organ system failure which requires frequent high complexity decision making, assessment, support, evaluation, and titration of therapies. This was completed through the application of advanced monitoring technologies and extensive interpretation of multiple databases. During this encounter critical care time was devoted to patient care services described in this note for 35 minutes.  Tim LairStephanie M Numa Heatwole, DO 10/17/22 2:53 PM [facility]  For contact information, see Amion. If no response to pager, please call PCCM consult pager. After hours, 7PM- 7AM, please call Elink.

## 2022-10-17 NOTE — Progress Notes (Signed)
[facility]  Subjective  -hand feels better today.   Objective 118/63 82 98 F (36.7 C) (Oral) 15 97%  Intake/Output Summary (Last 24 hours) at 10/17/2022 0729 Last data filed at 10/17/2022 0600 Gross per 24 hour  Intake 592.25 ml  Output 3810 ml  Net -3217.75 ml    Right forearm fasciotomy with VAC and hand is well-perfused  Laboratory Lab Results: Recent Labs    10/15/22 2120 10/16/22 0140  WBC 8.7 8.0  HGB 11.1* 11.2*  HCT 34.3* 34.0*  PLT 78* 79*   BMET Recent Labs    10/15/22 1231 10/16/22 0140  NA 133* 131*  K 4.3 4.6  CL 98 103  CO2 23 22  GLUCOSE 140* 183*  BUN 23 25*  CREATININE 1.15 1.05  CALCIUM 8.8* 8.5*    COAG Lab Results  Component Value Date   INR 1.3 (H) 10/15/2022   INR 1.3 (H) 10/15/2022   INR 1.8 (H) 01/07/2008   No results found for: "PTT"  Assessment/Planning:  Plan right forearm VAC change and washout of forearm fasciotomy with possible additional closure and VAC change.  Patient developed pseudoaneurysm with compartment syndrome after lysis of PEs at his transradial cath site.  Cephus Shelling 10/17/2022 7:29 AM --

## 2022-10-17 NOTE — Anesthesia Procedure Notes (Signed)
Procedure Name: Intubation Date/Time: 10/17/2022 7:56 AM  Performed by: Marny Lowenstein, CRNAPre-anesthesia Checklist: Patient identified, Emergency Drugs available, Suction available and Patient being monitored Patient Re-evaluated:Patient Re-evaluated prior to induction Oxygen Delivery Method: Circle system utilized Preoxygenation: Pre-oxygenation with 100% oxygen Induction Type: IV induction Ventilation: Mask ventilation without difficulty Laryngoscope Size: Miller and 2 Grade View: Grade I Tube type: Oral Tube size: 7.5 mm Number of attempts: 1 Airway Equipment and Method: Stylet Placement Confirmation: ETT inserted through vocal cords under direct vision, positive ETCO2 and breath sounds checked- equal and bilateral Secured at: 23 cm Tube secured with: Tape Dental Injury: Teeth and Oropharynx as per pre-operative assessment

## 2022-10-17 NOTE — Progress Notes (Signed)
PT Cancellation Note  Patient Details Name: Ralph Blackwell MRN: [id number] DOB: 12/14/1940   Cancelled Treatment:    Reason Eval/Treat Not Completed: (P) Patient at procedure or test/unavailable Pt is off the floor for surgical debridement. PT will follow back later for Evaluation as able.  Farran Amsden B. Beverely Risen PT, DPT Acute Rehabilitation Services Please use secure chat or  Call Office [phone]    Elon Alas [facility] 10/17/2022, 7:39 AM

## 2022-10-17 NOTE — Evaluation (Signed)
Occupational Therapy Evaluation Patient Details Name: Ralph Blackwell MRN: [id number] DOB: 1940/09/08 Today's Date: 10/17/2022   History of Present Illness 82 year old male that underwent right transradial cath with cardiology on 10/13/22.   Ultimately was found to have PEs and underwent thrombolysis and then developed right radial pseudoaneurysm with hematoma and compartment syndrome.  He underwent evacuation of the hematoma of his forearm with fasciotomies including carpal tunnel release and repair of the pseudoaneurysm. Underwent wash out and vac change 10/17/22. PMH: CAD, HTN, PE, OSA, renal insufficiency, GERD, Gout, Hiatal Hernia.   Clinical Impression   PTA pt lives independently with his wife. Attempted mobility however HR sustained in the 130s-150s in standing; 90s in supine. SpO2 96 on 2L with good pleth - no SOB noted. Currenlty requires Mod A with ADL due to below listed deficits. Significant edema RUE with decreased strength and ROM. Pt keeping RUE elevated on pillows however may benefit from the use of an arm elevator to reduce hand edema. Will follow acutely to facilitate safe DC home. Anticipate pt will need to follow up with OT as an outpt after DC.      Recommendations for follow up therapy are one component of a multi-disciplinary discharge planning process, led by the attending physician.  Recommendations may be updated based on patient status, additional functional criteria and insurance authorization.   Assistance Recommended at Discharge Intermittent Supervision/Assistance  Patient can return home with the following A little help with bathing/dressing/bathroom;Assistance with cooking/housework;Assist for transportation    Functional Status Assessment  Patient has had a recent decline in their functional status and demonstrates the ability to make significant improvements in function in a reasonable and predictable amount of time.  Equipment Recommendations  Tub/shower seat     Recommendations for Other Services       Precautions / Restrictions Precautions Precaution Comments: R arm wound vac      Mobility Bed Mobility Overal bed mobility: Needs Assistance Bed Mobility: Supine to Sit     Supine to sit: Min guard     General bed mobility comments: min guard for safety, vc for scooting hips to EoB    Transfers Overall transfer level: Needs assistance Equipment used:  (EVA walker) Transfers: Sit to/from Stand Sit to Stand: Min assist           General transfer comment: light min A for power up and management of UE to EVA walker, pt HR to 150s with standing and sustained in 140s      Balance Overall balance assessment: Modified Independent                                         ADL either performed or assessed with clinical judgement   ADL Overall ADL's : Needs assistance/impaired Eating/Feeding: Set up   Grooming: Moderate assistance   Upper Body Bathing: Adhering to UE precautions   Lower Body Bathing: With caregiver independent assisting   Upper Body Dressing : Adhering to UE precautions   Lower Body Dressing: Moderate assistance   Toilet Transfer: Minimal assistance   Toileting- Clothing Manipulation and Hygiene: Moderate assistance       Functional mobility during ADLs: Minimal assistance;+2 for safety/equipment (eva walker to suport RUE)       Vision Baseline Vision/History: 1 Wears glasses       Perception     Praxis      Pertinent Vitals/Pain  Pain Assessment Pain Assessment: 0-10 Pain Score: 2  Pain Location: R forearm and hand Pain Descriptors / Indicators: Dull, Throbbing Pain Intervention(s): Limited activity within patient's tolerance     Hand Dominance Right   Extremity/Trunk Assessment Upper Extremity Assessment Upper Extremity Assessment: RUE deficits/detail RUE Deficits / Details: significant edema R hand/forearm; gross grasp/release but lacks @ 2 inches from touching tips  to palm; elbow ROM limited due to vac seal; shoulder WFL however has hx of RTC insufficiency; not using R hand funcitonally at this time RUE Coordination: decreased fine motor   Lower Extremity Assessment Lower Extremity Assessment: Defer to PT evaluation   Cervical / Trunk Assessment Cervical / Trunk Assessment: Normal   Communication Communication Communication: No difficulties   Cognition Arousal/Alertness: Awake/alert Behavior During Therapy: WFL for tasks assessed/performed Overall Cognitive Status: Within Functional Limits for tasks assessed                                       General Comments  HR in 90s supine in bed, with coming to standing HR increased to 150s and sustained in 140s,    Exercises Exercises: General Upper Extremity General Exercises - Upper Extremity Shoulder Flexion: AROM, Right, 10 reps Elbow Flexion: AROM, Right, 10 reps Elbow Extension: AROM, Right, 10 reps Wrist Flexion: AROM, Right, 10 reps Wrist Extension: AROM, Right, 10 reps Digit Composite Flexion: AROM, AAROM, Right, 15 reps Composite Extension: AROM, AAROM, Right, 15 reps (digit opposition)   Shoulder Instructions      Home Living Family/patient expects to be discharged to:: Private residence Living Arrangements: Spouse/significant other Available Help at Discharge: Family Type of Home: House Home Access: Stairs to enter Secretary/administrator of Steps: 1   Home Layout: Two level;Able to live on main level with bedroom/bathroom Alternate Level Stairs-Number of Steps: 15 (office is upstairs)   Bathroom Shower/Tub: Producer, television/film/video: Handicapped height Bathroom Accessibility: Yes   Home Equipment: Cane - single point;Hand held shower head;Grab bars - tub/shower;Shower seat - built Magazine features editor (2 wheels)          Prior Functioning/Environment Prior Level of Function : Independent/Modified Independent;Driving (walks 2 miles a day at  baseline)               ADLs Comments: enjoys walking and being outside; recent trip to the [place]        OT Problem List: Decreased strength;Decreased range of motion;Decreased activity tolerance;Decreased coordination;Decreased knowledge of precautions;Impaired UE functional use;Pain;Increased edema      OT Treatment/Interventions: Self-care/ADL training;Therapeutic exercise;DME and/or AE instruction;Therapeutic activities;Patient/family education    OT Goals(Current goals can be found in the care plan section) Acute Rehab OT Goals Patient Stated Goal: to get better; hand to get better OT Goal Formulation: With patient Time For Goal Achievement: 10/31/22 Potential to Achieve Goals: Good  OT Frequency: Min 3X/week    Co-evaluation PT/OT/SLP Co-Evaluation/Treatment: Yes Reason for Co-Treatment: To address functional/ADL transfers   OT goals addressed during session: ADL's and self-care      AM-PAC OT "6 Clicks" Daily Activity     Outcome Measure Help from another person eating meals?: A Little Help from another person taking care of personal grooming?: A Little Help from another person toileting, which includes using toliet, bedpan, or urinal?: Total Help from another person bathing (including washing, rinsing, drying)?: Total Help from another person to put on and taking off regular  upper body clothing?: Total Help from another person to put on and taking off regular lower body clothing?: A Lot 6 Click Score: 11   End of Session Equipment Utilized During Treatment: Gait belt;Oxygen Hedda Slade(2L; Carley HammedEva) Nurse Communication: Mobility status;Other (comment) (elevated HR)  Activity Tolerance: Patient tolerated treatment well Patient left: in chair;with call bell/phone within reach;with family/visitor present  OT Visit Diagnosis: Muscle weakness (generalized) (M62.81);Pain                Time: [id number] OT Time Calculation (min): 30 min Charges:  OT General Charges $OT Visit: 1  Visit OT Evaluation $OT Eval Moderate Complexity: 1 Mod  Kensy Blizard, OT/L   Acute OT Clinical Specialist Acute Rehabilitation Services Pager [phone] Office [phone]   [facility] Campus[facility],HILLARY 10/17/2022, 5:02 PM

## 2022-10-17 NOTE — Op Note (Signed)
Date: October 17, 2022  Preoperative diagnosis: Right forearm compartment syndrome with radial artery pseudoaneurysm after transradial catheterization  Postoperative diagnosis: Same  Procedure: 1.  Washout of right forearm fasciotomy including evacuation of hematoma 2.  Placement of surgicel powder for hemostasis in the right forearm wound 3.  VAC change of right forearm fasciotomy wound with white sponge x2 and black sponge x1 (wound measures 12 cm long by 8 cm wide by 2 cm deep)  Surgeon: Dr. Cephus Shelling, MD  Assistant: Doreatha Massed, PA  Indications: 82 year old male that underwent right transradial cath with cardiology on 10/13/22.  Ultimately was found to have PEs and underwent thrombolysis and then developed right radial pseudoaneurysm with hematoma and compartment syndrome.  He underwent evacuation of the hematoma of his forearm with fasciotomies including carpal tunnel release and repair of the pseudoaneurysm earlier this week.  He presents today for washout and change of the VAC dressing.  Findings: There was a large hematoma underneath the muscle that was evacuated in the right forearm.  There was no focal bleeding identified just oozy muscle bed and soft tissue.  We got hemostasis with Bovie cautery.  Surgicel powder was placed in the wound as well.  I placed 2 white sponges over the radial and ulnar arteries and then a black VAC sponge.  Wound measures 12 cm long by 8 cm wide by 2 cm deep.  Some of the muscle appeared marginal but is viable.  Unable to close any of the wound due to tension.  Anesthesia: General  Details: Patient was taken to the operating room after informed consent was obtained.  Placed on operative table supine position.  General endotracheal anesthesia was induced.  The previous VAC dressing to the right arm was removed and the right arm was prepped and draped in standard sterile fashion.  Antibiotics were given and timeout performed.  Initially explored the  right forearm wound and we encountered a large hematoma cavity medially under the muscle belly in the forearm that was evacuated and approximately 300 mL of hematoma was removed.  We copiously irrigated the wound until the effluent was clear.  There was no overt bleeding.  We used Bovie cautery for hemostasis of raw surface oozing.  The radial and ulnar artery were both palpable and the previous repair at the radial artery at the pseudoaneurysm site was hemostatic.  I placed Surgicel powder in the wound for additional hemostasis.  We could not close anymore skin due to tension.  Some of the muscle did appear marginal and we will have to see how this looks at further exploration.  Two white vac sponges were cut and placed to cover the radial and ulnar artery and a black vac sponge was placed on top.  TRAC pad was applied with Ioban with good suction.  Complication: None  Condition: Stable, back to ICU  Cephus Shelling, MD [facility] [place] [place] Office: [phone]   Cephus Shelling

## 2022-10-17 NOTE — Progress Notes (Addendum)
This nurse went to unplug pt's WoundVac from the wall to move IV pole. When the device was unplugged and plugged back in, it did not turn back on. Attempted to unplug the device again and try a new outlet, but the device again did not turn on. Support number on WoundVac was called at [phone]. The support line assessed device and reported that since the device will not turn back on to switch the device and mark the current one out of service by contacting them back at the same number. Informed that pt was going to OR to have wound debrided and WoundVac changed. Support instructed to have someone call when the device was changed so it could be registered to the pt. Reports to use same support number as above and that once marked out of service, the device would be picked up for repair. WoundVac chamber noted to be 50 ml and had been working all night properly until unplugged this morning. Pt is not having any distress or pain. Right radial pulse still palpable with no new swelling or discoloration. Will inform OR staff of WoundVac issue and provide support number for follow-up.VSS, no concerns at this time from the pt.   Reference number for device: [id number]

## 2022-10-18 DIAGNOSIS — I200 Unstable angina: Secondary | ICD-10-CM | POA: Diagnosis not present

## 2022-10-18 DIAGNOSIS — D696 Thrombocytopenia, unspecified: Secondary | ICD-10-CM | POA: Diagnosis not present

## 2022-10-18 DIAGNOSIS — D62 Acute posthemorrhagic anemia: Secondary | ICD-10-CM | POA: Diagnosis not present

## 2022-10-18 LAB — POTASSIUM: Potassium: 4.5 mmol/L (ref 3.5–5.1)

## 2022-10-18 LAB — CBC
HCT: 26.6 % — ABNORMAL LOW (ref 39.0–52.0)
Hemoglobin: 8.7 g/dL — ABNORMAL LOW (ref 13.0–17.0)
MCH: 30.2 pg (ref 26.0–34.0)
MCHC: 32.7 g/dL (ref 30.0–36.0)
MCV: 92.4 fL (ref 80.0–100.0)
Platelets: 130 10*3/uL — ABNORMAL LOW (ref 150–400)
RBC: 2.88 MIL/uL — ABNORMAL LOW (ref 4.22–5.81)
RDW: 14.3 % (ref 11.5–15.5)
WBC: 12.4 10*3/uL — ABNORMAL HIGH (ref 4.0–10.5)
nRBC: 0.3 % — ABNORMAL HIGH (ref 0.0–0.2)

## 2022-10-18 LAB — BASIC METABOLIC PANEL
Anion gap: 10 (ref 5–15)
BUN: 36 mg/dL — ABNORMAL HIGH (ref 8–23)
CO2: 26 mmol/L (ref 22–32)
Calcium: 9 mg/dL (ref 8.9–10.3)
Chloride: 99 mmol/L (ref 98–111)
Creatinine, Ser: 1.09 mg/dL (ref 0.61–1.24)
GFR, Estimated: >60 mL/min (ref >60)
Glucose, Bld: 148 mg/dL — ABNORMAL HIGH (ref 70–99)
Potassium: 5.5 mmol/L — ABNORMAL HIGH (ref 3.5–5.1)
Sodium: 135 mmol/L (ref 135–145)

## 2022-10-18 LAB — GLUCOSE, CAPILLARY
Glucose-Capillary: 127 mg/dL — ABNORMAL HIGH (ref 70–99)
Glucose-Capillary: 178 mg/dL — ABNORMAL HIGH (ref 70–99)
Glucose-Capillary: 172 mg/dL — ABNORMAL HIGH (ref 70–99)
Glucose-Capillary: 162 mg/dL — ABNORMAL HIGH (ref 70–99)
Glucose-Capillary: 211 mg/dL — ABNORMAL HIGH (ref 70–99)

## 2022-10-18 LAB — MAGNESIUM: Magnesium: 2.2 mg/dL (ref 1.7–2.4)

## 2022-10-18 LAB — PHOSPHORUS: Phosphorus: 3.6 mg/dL (ref 2.5–4.6)

## 2022-10-18 LAB — CK: Total CK: 353 U/L (ref 49–397)

## 2022-10-18 LAB — HEPARIN LEVEL (UNFRACTIONATED): Heparin Unfractionated: 0.52 IU/mL (ref 0.30–0.70)

## 2022-10-18 MED ORDER — POLYETHYLENE GLYCOL 3350 17 G PO PACK: 17.0000 g | PACK | Freq: Every day | ORAL | Status: DC | PRN

## 2022-10-18 MED ORDER — SODIUM POLYSTYRENE SULFONATE 15 GM/60ML PO SUSP
15.0000 g | Freq: Once | ORAL | Status: DC
  Filled 2022-10-18: qty 60

## 2022-10-18 MED ORDER — SODIUM ZIRCONIUM CYCLOSILICATE 10 G PO PACK
10.0000 g | PACK | Freq: Once | ORAL | Status: AC
  Administered 2022-10-18: 10 g via ORAL
  Filled 2022-10-18: qty 1

## 2022-10-18 MED ORDER — SENNA 8.6 MG PO TABS
1.0000 | ORAL_TABLET | Freq: Every day | ORAL | Status: DC | PRN
  Administered 2022-10-18 – 2022-10-23 (×4): 8.6 mg via ORAL
  Filled 2022-10-18 (×4): qty 1

## 2022-10-18 MED ORDER — SODIUM CHLORIDE 0.9 % IV BOLUS
500.0000 mL | Freq: Once | INTRAVENOUS | Status: AC
  Administered 2022-10-18: 500 mL via INTRAVENOUS

## 2022-10-18 NOTE — Progress Notes (Signed)
ANTICOAGULATION CONSULT NOTE  Pharmacy Consult for Heparin Indication: chest pain/ACS  No Known Allergies  Patient Measurements: Height: 5' 9.5" (176.5 cm) Weight: 92.4 kg (203 lb 11.3 oz) IBW/kg (Calculated) : 71.85 Heparin Dosing Weight: 90.8 kg  Vital Signs: Temp: 98.1 F (36.7 C) (04/06 0755) Temp Source: Oral (04/06 0755) BP: 116/77 (04/06 1000) Pulse Rate: 89 (04/06 1000)  Labs: Recent Labs    10/15/22 1226 10/15/22 1231 10/15/22 2120 10/16/22 0140 10/16/22 1801 10/17/22 1021 10/18/22 0209  HGB  --    < > 11.1* 11.2*  --  9.6* 8.7*  HCT  --    < > 34.3* 34.0*  --  29.2* 26.6*  PLT  --    < > 78* 79*  --  120* 130*  APTT 109*  --  105*  --   --   --   --   LABPROT 15.6*  --  15.8*  --   --   --   --   INR 1.3*  --  1.3*  --   --   --   --   HEPARINUNFRC  --    < > 0.37 0.55 0.41 0.45 0.52  CREATININE  --    < >  --  1.05  --  1.21 1.09  CKTOTAL  --   --   --   --   --   --  353   < > = values in this interval not displayed.     Estimated Creatinine Clearance: 62.3 mL/min (by C-G formula based on SCr of 1.09 mg/dL).   Assessment: 6 yoM admitted with CP with clean cath. CT chest showed PE. Pt started on IV heparin. IR placed bilateral PE lysis catheters 4/2 x12h and heparin continued. Pt with compartment syndrome and taken to OR for fasciotomy 4/3, heparin resumed postop.  Heparin level therapeutic at 0.52 but slightly above lower target (given recent fasciotomy for radial artery pseudoaneurysm). H/H stable, pltc improving.  Goal of Therapy:  Heparin level 0.3-0.5 units/ml  Monitor platelets by anticoagulation protocol: Yes   Plan:  Reduce heparin to 950 units/h Daily heparin level and CBC  Fredonia Highland, PharmD, [place], [facility] Clinical Pharmacist [phone] Please check AMION for all [facility] Pharmacy numbers 10/18/2022

## 2022-10-18 NOTE — Progress Notes (Signed)
   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  He had a washout yesterday.  The VAC has a good seal.  The plan is to change this on Monday.  He has moderate swelling in the right hand.  He is doing an excellent job of elevating the arm.  He has a brisk palmar arch signal with the Doppler.  SUBMASSIVE PULMONARY EMBOLISM: He is on IV heparin.  Dr. Chestine Spore plans return to the operating room on Monday.   SUBJECTIVE:   He has been elevating his right arm all night and thinks that the swelling is better.  PHYSICAL EXAM:   Vitals:   10/18/22 0400 10/18/22 0500 10/18/22 0600 10/18/22 0700  BP: 134/77 (!) 149/91 (!) 133/58 137/64  Pulse: 79 83 82 87  Resp: 13 (!) 23 13 13   Temp: 98.3 F (36.8 C)     TempSrc: Oral     SpO2: 97% 94% 95% 98%  Weight:  92.4 kg    Height:       Moderate swelling right hand. Brisk palmar arch signal with the Doppler. The VAC has a good seal.  LABS:   Lab Results  Component Value Date   WBC 12.4 (H) 10/18/2022   HGB 8.7 (L) 10/18/2022   HCT 26.6 (L) 10/18/2022   MCV 92.4 10/18/2022   PLT 130 (L) 10/18/2022   Lab Results  Component Value Date   CREATININE 1.09 10/18/2022   Lab Results  Component Value Date   INR 1.3 (H) 10/15/2022   CBG (last 3)  Recent Labs    10/17/22 2129 10/17/22 2333 10/18/22 0358  GLUCAP 147* 139* 127*    PROBLEM LIST:    Principal Problem:   Unstable angina Active Problems:   Essential hypertension   Mixed hyperlipidemia   Thrombocytopenia   Atypical angina   Acute pulmonary embolism with acute cor pulmonale   Hematoma of forearm   Compartment syndrome of right hand   Thrombocytopenia, unspecified   CURRENT MEDS:    sodium chloride   Intravenous Once   allopurinol  150 mg Oral Daily   aspirin EC  81 mg Oral Daily   Chlorhexidine Gluconate Cloth  6  each Topical Daily   finasteride  5 mg Oral QHS   insulin aspart  1-3 Units Subcutaneous Q4H   pantoprazole  80 mg Oral Daily   rosuvastatin  40 mg Oral Daily   sodium chloride flush  3 mL Intravenous Q12H   sodium chloride flush  3 mL Intravenous Q12H    Waverly Ferrari Office: [phone] 10/18/2022

## 2022-10-18 NOTE — Progress Notes (Signed)
NAME:  Ralph Blackwell, MRN:  [id number], DOB:  03-Oct-1940, LOS: 6 ADMISSION DATE:  10/11/2022, CONSULTATION DATE:  10/13/2022 REFERRING MD:  Dr. Jacqulyn Bath, CHIEF COMPLAINT:  SOB, Chest Pain   History of Present Illness:  82 year old man who presented to [facility] ED 3/30 with progressive SOB over several weeks.  The patient reports they traveled approximately 3 weeks ago to the [place].  While on vacation he walked 2 miles per day.  He typically is normally very active.  He notes when he returned home he was taking his dog out to go to to the bathroom and he developed increasing shortness of breath and chest tightness.  He describes it as a "vice on his chest". He states he did not feel like he was good to be able to make it back into the house.  He attempted sublingual nitro and he did have some improvement in symptoms.  Noting chest pain radiated to both arms.  He was evaluated by Cardiology on presentation and was thought to have unstable angina.  In route with EMS the patient was reportedly very diaphoretic and short of breath.  He was started on nitro and heparin infusion.  He continued to have chest pain with shortness of breath with minimal activity such as brushing teeth.  He was taken for left heart cath on 4/1 with findings of no angiographically evident significant coronary artery disease.  No acute findings to suggest cause of patient's chest pain.  Prior echocardiogram performed approximately a week before admission showed an RVSP of 28 mmHg.  Repeat ECHO this admission showed RVSP of 69 mmHg.  The patient subsequently had a CT angio of the chest which was positive for saddle PE with right greater than left pulmonary emboli, noted CT evidence of right heart strain with RV LV ratio of 1.18 consistent with at least submassive PE.  Patient was evaluated by interventional radiology.  PCCM consulted for evaluation   Pertinent Medical History:  CAD HTN Pulmonary embolism-2008 after knee surgery,  subsequent fall with wrist fracture OSA Renal insufficiency GERD Gout Hiatal hernia  Significant Hospital Events: Including procedures, antibiotic start and stop dates in addition to other pertinent events   3/30 admit with SOB, chest pain 4/1 LHC with no angiographically evident coronary artery disease to suggest cause of chest pain.  Noted elevated RVSP on echo. CTA completed & positive for saddle PE. IR/PCCM consulted.  4/2 hemodynamically stable, though had an episode of CP this morning 4/3 Acute onset of RUE hand/wrist/forearm swelling, c/f compartment syndrome. VVS/Hand Surgery consulted. Plan for OR for fasciotomy. 4/4 Escalating hand pain late morning with cold fingers, loss of pleth, loss of palpable pulse/doppler only. Dressing loosened with some relief. Later in afternoon, ambulating with continued improvement in swelling. 4/5 OR with VVS for washout/WV replacement and hematoma evacuation, tolerated well. Surgicel placed in wound bed.  Interim History / Subjective:  Ralph Blackwell is feeling well and has not been requiring pain meds. His edema in his arm continues to improve.   Objective:  Blood pressure 110/70, pulse (!) 111, temperature 98 F (36.7 C), temperature source Oral, resp. rate 17, height 5' 9.5" (1.765 m), weight 92.4 kg, SpO2 92 %.        Intake/Output Summary (Last 24 hours) at 10/18/2022 1314 Last data filed at 10/18/2022 1200 Gross per 24 hour  Intake 1391.3 ml  Output 2875 ml  Net -1483.7 ml    Filed Weights   10/14/22 0300 10/15/22 1417 10/18/22 0500  Weight: 92.4 kg 93 kg 92.4 kg   Physical Examination: General: elderly man sitting up in the chair in NAD HEENT: [place]/AT, eyes anicteric Neuro: Wake and alert, answering questions appropriately, moving all extremities. CV: Swan S2, tachycardic, regular rhythm PULM: Breathing comfortably on nasal cannula, CTAB. GI:, Nontender, nondistended Extremities: Right upper extremity well-perfused, significant  bruising still in the hand, edema slowly improving.  Wound VAC in place with small volume of bloody output..   Skin: Warm, dry, no diffuse rashes  K+ 5.5> recheck 4.5 BUN 36 Cr 1.09 WBC 12.4 H/H 8.7/26.6 Platelets 130   Resolved Hospital Problem List:   hyponatremia  Assessment & Plan:   Compartment syndrome of the right forearm, hand swelling and hematoma from bleeding into the forearm from previous catheter access site.  Unfortunately this is an iatrogenic complication of tPA administration for pulmonary embolism. Radial artery pseudoaneurysm, status post repair - Appreciate vascular surgery's management.  Planning on returning to the OR on 4/8.  Continue monitoring hand pulses and perfusion. - Continue range of motion exercises and out of bed mobility as able - Keep arm elevated - Pain control as needed-acetaminophen, oxycodone, fentanyl.   Submassive pulmonary embolism with persistent chest pain, status post catheter directed lytics - Continue heparin infusion - Telemetry monitoring - Trend labs clinical course may require longer term anticoagulation since he previously had an unprovoked clot after surgery in 2008. - Patient will require follow-up echocardiogram to reevaluate his RV.   Acute blood loss anemia due to bleeding into forearm - Transfuse for hemoglobin less than 7 or hemodynamically significant bleeding.  May require 1 this admission. - Continue to monitor   Thrombocytopenia-unknown chronicity.  He reports that this would have been picked up by his PCP had it been more chronic.  Improving. - Need for transition today.  Would likely benefit from platelets greater than 100 since he continues to require heparin.   Hyperglycemia - SSI as needed - Goal blood glucose less than 180  Constipation - Adding senna and MiraLAX. -Mobility  Hyperkalemia> suspect this was due to hemolysis not recognized in the lab given rapid correction - Continue to monitor.  If not  tolerating heparin due to hyperkalemia may need to switch to Lovenox.  History of gout - Continue allopurinol.  BPH - Continue PTA Proscar.  History of hypertension - Hold PTA metoprolol and ARB to ensure adequate perfusion of his arm.  GERD - Continue PPI   Son at bedside, updated with Mr. Olesh on rounds.  Best Practice: (right click and "Reselect all SmartList Selections" daily)  Diet/type: Regular consistency (see orders) DVT prophylaxis: systemic heparin GI prophylaxis: PPI Lines: N/A Foley:  N/A Code Status:  full code Last date of multidisciplinary goals of care discussion: full code, family updated at the bedside 4/5  Critical care time:       Steffanie Dunn, DO 10/18/22 4:32 PM [facility]  For contact information, see Amion. If no response to pager, please call PCCM consult pager. After hours, 7PM- 7AM, please call Elink.

## 2022-10-18 NOTE — Progress Notes (Addendum)
eLink Physician-Brief Progress Note Patient Name: Ralph Blackwell DOB: 1940/08/29 MRN: [id number]   Date of Service  10/18/2022  HPI/Events of Note  82 year old who initially presented with submassive pulmonary embolus with lytics right upper extremity swelling and developed compartment syndrome status post right radial artery pseudoaneurysm repair and wound VAC placement  Hypokalemia this morning.  eICU Interventions  Add on CK  Saline bolus 500 cc, Kayexalate x 1 per protocol.   9292 - per formulary request, switched kayexalate to [facility]   Intervention Category Minor Interventions: Electrolytes abnormality - evaluation and management  Rylee Huestis 10/18/2022, 4:11 AM

## 2022-10-19 DIAGNOSIS — I2602 Saddle embolus of pulmonary artery with acute cor pulmonale: Secondary | ICD-10-CM | POA: Diagnosis not present

## 2022-10-19 DIAGNOSIS — D696 Thrombocytopenia, unspecified: Secondary | ICD-10-CM | POA: Diagnosis not present

## 2022-10-19 DIAGNOSIS — D62 Acute posthemorrhagic anemia: Secondary | ICD-10-CM | POA: Diagnosis not present

## 2022-10-19 DIAGNOSIS — M79A11 Nontraumatic compartment syndrome of right upper extremity: Secondary | ICD-10-CM | POA: Diagnosis not present

## 2022-10-19 LAB — GLUCOSE, CAPILLARY
Glucose-Capillary: 121 mg/dL — ABNORMAL HIGH (ref 70–99)
Glucose-Capillary: 122 mg/dL — ABNORMAL HIGH (ref 70–99)
Glucose-Capillary: 133 mg/dL — ABNORMAL HIGH (ref 70–99)
Glucose-Capillary: 143 mg/dL — ABNORMAL HIGH (ref 70–99)
Glucose-Capillary: 159 mg/dL — ABNORMAL HIGH (ref 70–99)
Glucose-Capillary: 166 mg/dL — ABNORMAL HIGH (ref 70–99)
Glucose-Capillary: 168 mg/dL — ABNORMAL HIGH (ref 70–99)

## 2022-10-19 LAB — CBC
HCT: 23.8 % — ABNORMAL LOW (ref 39.0–52.0)
Hemoglobin: 7.8 g/dL — ABNORMAL LOW (ref 13.0–17.0)
MCH: 30 pg (ref 26.0–34.0)
MCHC: 32.8 g/dL (ref 30.0–36.0)
MCV: 91.5 fL (ref 80.0–100.0)
Platelets: 144 10*3/uL — ABNORMAL LOW (ref 150–400)
RBC: 2.6 MIL/uL — ABNORMAL LOW (ref 4.22–5.81)
RDW: 14.3 % (ref 11.5–15.5)
WBC: 11.9 10*3/uL — ABNORMAL HIGH (ref 4.0–10.5)
nRBC: 2.7 % — ABNORMAL HIGH (ref 0.0–0.2)

## 2022-10-19 LAB — HEPARIN LEVEL (UNFRACTIONATED): Heparin Unfractionated: 0.38 IU/mL (ref 0.30–0.70)

## 2022-10-19 MED ORDER — INSULIN ASPART 100 UNIT/ML IJ SOLN
1.0000 [IU] | Freq: Three times a day (TID) | INTRAMUSCULAR | Status: DC
  Administered 2022-10-19: 1 [IU] via SUBCUTANEOUS
  Administered 2022-10-20: 2 [IU] via SUBCUTANEOUS
  Administered 2022-10-20 – 2022-10-21 (×2): 1 [IU] via SUBCUTANEOUS
  Administered 2022-10-21 (×2): 2 [IU] via SUBCUTANEOUS
  Administered 2022-10-21: 3 [IU] via SUBCUTANEOUS
  Administered 2022-10-22: 2 [IU] via SUBCUTANEOUS
  Administered 2022-10-23: 1 [IU] via SUBCUTANEOUS
  Administered 2022-10-23 – 2022-10-24 (×3): 2 [IU] via SUBCUTANEOUS
  Administered 2022-10-24 (×2): 1 [IU] via SUBCUTANEOUS
  Administered 2022-10-25: 3 [IU] via SUBCUTANEOUS
  Administered 2022-10-26: 2 [IU] via SUBCUTANEOUS
  Administered 2022-10-27: 1 [IU] via SUBCUTANEOUS

## 2022-10-19 MED ORDER — METOPROLOL TARTRATE 12.5 MG HALF TABLET
12.5000 mg | ORAL_TABLET | Freq: Two times a day (BID) | ORAL | Status: DC
  Administered 2022-10-20 – 2022-10-27 (×15): 12.5 mg via ORAL
  Filled 2022-10-19 (×16): qty 1

## 2022-10-19 NOTE — Progress Notes (Signed)
NAME:  Ralph Blackwell, MRN:  [id number], DOB:  [birth year], LOS: 7 ADMISSION DATE:  10/11/2022, CONSULTATION DATE:  10/13/2022 REFERRING MD:  Dr. Jacqulyn Bath, CHIEF COMPLAINT:  SOB, Chest Pain   History of Present Illness:  82 year old man who presented to [facility] ED 3/30 with progressive SOB over several weeks.  The patient reports they traveled approximately 3 weeks ago to the [place].  While on vacation he walked 2 miles per day.  He typically is normally very active.  He notes when he returned home he was taking his dog out to go to to the bathroom and he developed increasing shortness of breath and chest tightness.  He describes it as a "vice on his chest". He states he did not feel like he was good to be able to make it back into the house.  He attempted sublingual nitro and he did have some improvement in symptoms.  Noting chest pain radiated to both arms.  He was evaluated by Cardiology on presentation and was thought to have unstable angina.  In route with EMS the patient was reportedly very diaphoretic and short of breath.  He was started on nitro and heparin infusion.  He continued to have chest pain with shortness of breath with minimal activity such as brushing teeth.  He was taken for left heart cath on 4/1 with findings of no angiographically evident significant coronary artery disease.  No acute findings to suggest cause of patient's chest pain.  Prior echocardiogram performed approximately a week before admission showed an RVSP of 28 mmHg.  Repeat ECHO this admission showed RVSP of 69 mmHg.  The patient subsequently had a CT angio of the chest which was positive for saddle PE with right greater than left pulmonary emboli, noted CT evidence of right heart strain with RV LV ratio of 1.18 consistent with at least submassive PE.  Patient was evaluated by interventional radiology.  PCCM consulted for evaluation   Pertinent Medical History:  CAD HTN Pulmonary embolism-2008 after knee surgery,  subsequent fall with wrist fracture OSA Renal insufficiency GERD Gout Hiatal hernia  Significant Hospital Events: Including procedures, antibiotic start and stop dates in addition to other pertinent events   3/30 admit with SOB, chest pain 4/1 LHC with no angiographically evident coronary artery disease to suggest cause of chest pain.  Noted elevated RVSP on echo. CTA completed & positive for saddle PE. IR/PCCM consulted.  4/2 hemodynamically stable, though had an episode of CP this morning 4/3 Acute onset of RUE hand/wrist/forearm swelling, c/f compartment syndrome. VVS/Hand Surgery consulted. Plan for OR for fasciotomy. 4/4 Escalating hand pain late morning with cold fingers, loss of pleth, loss of palpable pulse/doppler only. Dressing loosened with some relief. Later in afternoon, ambulating with continued improvement in swelling. 4/5 OR with VVS for washout/WV replacement and hematoma evacuation, tolerated well. Surgicel placed in wound bed.  Interim History / Subjective:  He has been walking several times per day. His hand is not hurting. He has been eating well this weekend. No new complaints.   Objective:  Blood pressure (!) 113/56, pulse 77, temperature 98.2 F (36.8 C), temperature source Oral, resp. rate 11, height 5' 9.5" (1.765 m), weight 92.4 kg, SpO2 95 %.        Intake/Output Summary (Last 24 hours) at 10/19/2022 1027 Last data filed at 10/19/2022 0900 Gross per 24 hour  Intake 779.28 ml  Output 3750 ml  Net -2970.72 ml    Filed Weights   10/14/22 0300 10/15/22 1417  10/18/22 0500  Weight: 92.4 kg 93 kg 92.4 kg   Physical Examination: General: Elderly man sitting up in the chair in NAD HEENT: [place]/AT, eyes anicteric Neuro: Awake, alert, ambulating easily with the steady. Normal speech, answering questions appropriately.  CV: tachycardic, irreg rhythm PULM: breathing comfortably on RA GI: nondistended Extremities: RUE with wound vac, still bruised but edema is  improving Skin: warm, dry, no diffuse rashes.   WBC 11.9 H/H 7.8/23.8 Platelets 144 EKG personally reviewed> irreg rhythm, sinus tach with PACs. P waves best seen in V4.  Resolved Hospital Problem List:   hyponatremia  Assessment & Plan:   Compartment syndrome of the right forearm, hand swelling and hematoma from bleeding into the forearm from previous catheter access site.  Unfortunately this is an iatrogenic complication of tPA administration for pulmonary embolism. Radial artery pseudoaneurysm, status post repair - Appreciate vascular surgery's management. OR tomorrow. Con't close monitoring of perfusion and pulses.  - Con't hand ROM exercises and mobility.  - Keeping arm elevated.  - Pain control as needed-acetaminophen, oxycodone, fentanyl   Submassive pulmonary embolism with persistent chest pain, status post catheter directed lytics - Heparin infusion -tele monitoring - Telemetry monitoring - Needs minimum 3 months AC; need to determine if risk vs benefit of AC longer-term. - Patient will require follow-up echocardiogram to reevaluate his RV as an OP.   Tachycardia is likely due to anemia -anticipate he will need pRBCs tomorrow peri-op -start low dose metoprolol  Acute blood loss anemia due to bleeding into forearm -transfuse for Hb <7 or hemodynamically significant bleeding -send type and screen with AM labs  Thrombocytopenia-unknown chronicity.  He reports that this would have been picked up by his PCP had it been more chronic.  Improving. -no need for transfusion today   Hyperglycemia -SSI PRN TIDAC --goal BG 140-180  Constipation -mobility -laxatives as needed  Hyperkalemia> suspect this was due to hemolysis not recognized in the lab given rapid correction - repeat BMP tomorrow morning  History of gout - con't allopurinol  BPH --Con't PTA proscar  History of hypertension -resume low-dose metoprolol; hold PTA ARB to ensure adequate perfusion of  arm  GERD - Con't PPI   Updated [name] and his wife at bedside during rounds.   Best Practice: (right click and "Reselect all SmartList Selections" daily)  Diet/type: Regular consistency (see orders), NPO p mn DVT prophylaxis: systemic heparin GI prophylaxis: PPI Lines: N/A Foley:  N/A Code Status:  full code Last date of multidisciplinary goals of care discussion: full code, family updated at the bedside 4/7  Critical care time:       Steffanie Dunn, DO 10/19/22 4:43 PM  [facility]  For contact information, see Amion. If no response to pager, please call PCCM consult pager. After hours, 7PM- 7AM, please call Elink.

## 2022-10-19 NOTE — Progress Notes (Signed)
  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  He had a washout Friday.  The VAC has a good seal.  The plan is to change this tomorrow.  He has moderate swelling in the right hand.  He is doing an excellent job of elevating the arm.  He has a brisk palmar arch signal with the Doppler.   SUBMASSIVE PULMONARY EMBOLISM: He is on IV heparin.   Dr. Chestine Spore plans return to the operating room on Monday.  I have written preop orders.  SUBJECTIVE:   He feels like the swelling has improved significantly.  PHYSICAL EXAM:   Vitals:   10/19/22 0400 10/19/22 0500 10/19/22 0600 10/19/22 0735  BP: (!) 113/59 (!) 118/57 (!) 113/56   Pulse: 79 78 77   Resp: 13 15 11    Temp:    98.2 F (36.8 C)  TempSrc:    Oral  SpO2: 94% 91% 95%   Weight:      Height:       The right hand is warm. The swelling of the right hand is improved significantly. The VAC has a good seal.  LABS:   Lab Results  Component Value Date   WBC 11.9 (H) 10/19/2022   HGB 7.8 (L) 10/19/2022   HCT 23.8 (L) 10/19/2022   MCV 91.5 10/19/2022   PLT 144 (L) 10/19/2022   Lab Results  Component Value Date   CREATININE 1.09 10/18/2022   Lab Results  Component Value Date   INR 1.3 (H) 10/15/2022   CBG (last 3)  Recent Labs    10/19/22 0000 10/19/22 0335 10/19/22 0739  GLUCAP 121* 122* 133*    PROBLEM LIST:    Principal Problem:   Unstable angina Active Problems:   Essential hypertension   Mixed hyperlipidemia   Thrombocytopenia   Atypical angina   Acute pulmonary embolism with acute cor pulmonale   Hematoma of forearm   Compartment syndrome of right hand   Thrombocytopenia, unspecified   CURRENT MEDS:    sodium chloride   Intravenous Once   allopurinol  150 mg Oral Daily   aspirin EC  81 mg Oral Daily   Chlorhexidine Gluconate Cloth  6 each Topical Daily    finasteride  5 mg Oral QHS   insulin aspart  1-3 Units Subcutaneous Q4H   pantoprazole  80 mg Oral Daily   rosuvastatin  40 mg Oral Daily   sodium chloride flush  3 mL Intravenous Q12H   sodium chloride flush  3 mL Intravenous Q12H    Waverly Ferrari Office: [phone] 10/19/2022

## 2022-10-19 NOTE — Progress Notes (Signed)
ANTICOAGULATION CONSULT NOTE  Pharmacy Consult for Heparin Indication: chest pain/ACS  No Known Allergies  Patient Measurements: Height: 5' 9.5" (176.5 cm) Weight: 92.4 kg (203 lb 11.3 oz) IBW/kg (Calculated) : 71.85 Heparin Dosing Weight: 90.8 kg  Vital Signs: Temp: 98.2 F (36.8 C) (04/07 0336) Temp Source: Oral (04/07 0336) BP: 113/56 (04/07 0600) Pulse Rate: 77 (04/07 0600)  Labs: Recent Labs    10/17/22 1021 10/18/22 0209 10/19/22 0150  HGB 9.6* 8.7* 7.8*  HCT 29.2* 26.6* 23.8*  PLT 120* 130* 144*  HEPARINUNFRC 0.45 0.52 0.38  CREATININE 1.21 1.09  --   CKTOTAL  --  353  --      Estimated Creatinine Clearance: 62.3 mL/min (by C-G formula based on SCr of 1.09 mg/dL).   Assessment: 58 yoM admitted with CP with clean cath. CT chest showed PE. Pt started on IV heparin. IR placed bilateral PE lysis catheters 4/2 x12h and heparin continued. Pt with compartment syndrome and taken to OR for fasciotomy 4/3, heparin resumed postop.  Heparin level therapeutic at 0.38 (targeting lower goal given recent fasciotomy for radial artery pseudoaneurysm). H/H trending down, pltc improving.  Goal of Therapy:  Heparin level 0.3-0.5 units/ml  Monitor platelets by anticoagulation protocol: Yes   Plan:  Continue heparin 950 units/h Daily heparin level and CBC  Fredonia Highland, PharmD, [employer], [facility] Clinical Pharmacist [phone] Please check AMION for all [facility] numbers 10/19/2022

## 2022-10-19 NOTE — Anesthesia Postprocedure Evaluation (Signed)
Anesthesia Post Note  Patient: [name]  Procedure(s) Performed: IRRIGATION AND DEBRIDEMENT RIGHT ARM & WOUND VAC CHANGE (Right)     Patient location during evaluation: PACU Anesthesia Type: General Level of consciousness: awake and alert Pain management: pain level controlled Vital Signs Assessment: post-procedure vital signs reviewed and stable Respiratory status: spontaneous breathing, nonlabored ventilation, respiratory function stable and patient connected to nasal cannula oxygen Cardiovascular status: blood pressure returned to baseline and stable Postop Assessment: no apparent nausea or vomiting Anesthetic complications: no  No notable events documented.  Last Vitals:  Vitals:   10/19/22 0600 10/19/22 0735  BP: (!) 113/56   Pulse: 77   Resp: 11   Temp:  36.8 C  SpO2: 95%     Last Pain:  Vitals:   10/19/22 0735  TempSrc: Oral  PainSc:                  Dawit Tankard

## 2022-10-19 NOTE — Progress Notes (Signed)
Interventional Radiology Brief Note:  IR continues to follow and monitor progress after limited PE Lysis procedure 4/2 due to the development of right forearm compartment syndrome at the site of prior cardiac cath.   Per chart review, patient remains stable. Ongoing swelling in the right hand.  Plan remains for OR tomorrow (Monday) with VVS for wound vac exchange.   BP stable, remains tachycardic at times with adequate O2 sats on room air.   IR following.   Loyce Dys, MS RD PA-C

## 2022-10-20 ENCOUNTER — Inpatient Hospital Stay (HOSPITAL_COMMUNITY): Payer: PPO | Admitting: Anesthesiology

## 2022-10-20 ENCOUNTER — Other Ambulatory Visit: Payer: Self-pay

## 2022-10-20 ENCOUNTER — Encounter (HOSPITAL_COMMUNITY): Payer: Self-pay | Admitting: Vascular Surgery

## 2022-10-20 ENCOUNTER — Encounter (HOSPITAL_COMMUNITY)
Admission: EM | Disposition: A | Discharge status: Home Health | Payer: Self-pay | Source: Other Acute Inpatient Hospital | Attending: Critical Care Medicine

## 2022-10-20 DIAGNOSIS — I2602 Saddle embolus of pulmonary artery with acute cor pulmonale: Secondary | ICD-10-CM | POA: Diagnosis not present

## 2022-10-20 DIAGNOSIS — I2699 Other pulmonary embolism without acute cor pulmonale: Secondary | ICD-10-CM

## 2022-10-20 DIAGNOSIS — S51801A Unspecified open wound of right forearm, initial encounter: Secondary | ICD-10-CM | POA: Diagnosis not present

## 2022-10-20 DIAGNOSIS — I721 Aneurysm of artery of upper extremity: Secondary | ICD-10-CM | POA: Diagnosis not present

## 2022-10-20 DIAGNOSIS — G4730 Sleep apnea, unspecified: Secondary | ICD-10-CM

## 2022-10-20 DIAGNOSIS — M79A11 Nontraumatic compartment syndrome of right upper extremity: Secondary | ICD-10-CM | POA: Diagnosis not present

## 2022-10-20 DIAGNOSIS — I97630 Postprocedural hematoma of a circulatory system organ or structure following a cardiac catheterization: Secondary | ICD-10-CM | POA: Diagnosis not present

## 2022-10-20 HISTORY — PX: INCISION AND DRAINAGE OF WOUND: SHX1803

## 2022-10-20 HISTORY — PX: APPLICATION OF WOUND VAC: SHX5189

## 2022-10-20 LAB — CBC
HCT: 24.4 % — ABNORMAL LOW (ref 39.0–52.0)
Hemoglobin: 7.9 g/dL — ABNORMAL LOW (ref 13.0–17.0)
MCH: 30.3 pg (ref 26.0–34.0)
MCHC: 32.4 g/dL (ref 30.0–36.0)
MCV: 93.5 fL (ref 80.0–100.0)
Platelets: 179 10*3/uL (ref 150–400)
RBC: 2.61 MIL/uL — ABNORMAL LOW (ref 4.22–5.81)
RDW: 14.6 % (ref 11.5–15.5)
WBC: 12.6 10*3/uL — ABNORMAL HIGH (ref 4.0–10.5)
nRBC: 2.9 % — ABNORMAL HIGH (ref 0.0–0.2)

## 2022-10-20 LAB — GLUCOSE, CAPILLARY
Glucose-Capillary: 128 mg/dL — ABNORMAL HIGH (ref 70–99)
Glucose-Capillary: 143 mg/dL — ABNORMAL HIGH (ref 70–99)
Glucose-Capillary: 164 mg/dL — ABNORMAL HIGH (ref 70–99)

## 2022-10-20 LAB — BASIC METABOLIC PANEL
Anion gap: 6 (ref 5–15)
BUN: 30 mg/dL — ABNORMAL HIGH (ref 8–23)
CO2: 24 mmol/L (ref 22–32)
Calcium: 8.6 mg/dL — ABNORMAL LOW (ref 8.9–10.3)
Chloride: 105 mmol/L (ref 98–111)
Creatinine, Ser: 1.09 mg/dL (ref 0.61–1.24)
GFR, Estimated: >60 mL/min (ref >60)
Glucose, Bld: 143 mg/dL — ABNORMAL HIGH (ref 70–99)
Potassium: 4.2 mmol/L (ref 3.5–5.1)
Sodium: 135 mmol/L (ref 135–145)

## 2022-10-20 LAB — TYPE AND SCREEN
ABO/RH(D): O NEG
Antibody Screen: NEGATIVE

## 2022-10-20 LAB — HEPARIN LEVEL (UNFRACTIONATED): Heparin Unfractionated: 0.31 IU/mL (ref 0.30–0.70)

## 2022-10-20 SURGERY — IRRIGATION AND DEBRIDEMENT WOUND
Anesthesia: General | Site: Arm Lower | Laterality: Right

## 2022-10-20 MED ORDER — PROPOFOL 10 MG/ML IV BOLUS
INTRAVENOUS | Status: DC | PRN
  Administered 2022-10-20: 110 mg via INTRAVENOUS

## 2022-10-20 MED ORDER — DEXAMETHASONE SODIUM PHOSPHATE 10 MG/ML IJ SOLN
INTRAMUSCULAR | Status: DC | PRN
  Administered 2022-10-20: 10 mg via INTRAVENOUS

## 2022-10-20 MED ORDER — HYDROMORPHONE HCL 1 MG/ML IJ SOLN
INTRAMUSCULAR | Status: AC
  Filled 2022-10-20: qty 1

## 2022-10-20 MED ORDER — ONDANSETRON HCL 4 MG/2ML IJ SOLN
INTRAMUSCULAR | Status: DC | PRN
  Administered 2022-10-20: 4 mg via INTRAVENOUS

## 2022-10-20 MED ORDER — LIDOCAINE 2% (20 MG/ML) 5 ML SYRINGE
INTRAMUSCULAR | Status: DC | PRN
  Administered 2022-10-20: 80 mg via INTRAVENOUS

## 2022-10-20 MED ORDER — CEFAZOLIN SODIUM-DEXTROSE 2-4 GM/100ML-% IV SOLN
INTRAVENOUS | Status: AC
  Filled 2022-10-20: qty 100

## 2022-10-20 MED ORDER — CHLORHEXIDINE GLUCONATE 0.12 % MT SOLN: 15.0000 mL | Freq: Once | OROMUCOSAL | Status: DC

## 2022-10-20 MED ORDER — AMISULPRIDE (ANTIEMETIC) 5 MG/2ML IV SOLN: 10.0000 mg | Freq: Once | INTRAVENOUS | Status: DC | PRN

## 2022-10-20 MED ORDER — 0.9 % SODIUM CHLORIDE (POUR BTL) OPTIME
TOPICAL | Status: DC | PRN
  Administered 2022-10-20: 1000 mL

## 2022-10-20 MED ORDER — MEPERIDINE HCL 25 MG/ML IJ SOLN: 6.2500 mg | INTRAMUSCULAR | Status: DC | PRN

## 2022-10-20 MED ORDER — CEFAZOLIN SODIUM-DEXTROSE 2-3 GM-%(50ML) IV SOLR
INTRAVENOUS | Status: DC | PRN
  Administered 2022-10-20: 2 g via INTRAVENOUS

## 2022-10-20 MED ORDER — PHENYLEPHRINE HCL-NACL 20-0.9 MG/250ML-% IV SOLN
INTRAVENOUS | Status: DC | PRN
  Administered 2022-10-20: 30 ug/min via INTRAVENOUS

## 2022-10-20 MED ORDER — CHLORHEXIDINE GLUCONATE 0.12 % MT SOLN
OROMUCOSAL | Status: AC
  Filled 2022-10-20: qty 15

## 2022-10-20 MED ORDER — PROPOFOL 10 MG/ML IV BOLUS
INTRAVENOUS | Status: AC
  Filled 2022-10-20: qty 20

## 2022-10-20 MED ORDER — LACTATED RINGERS IV SOLN: INTRAVENOUS | Status: DC | PRN

## 2022-10-20 MED ORDER — FENTANYL CITRATE (PF) 250 MCG/5ML IJ SOLN
INTRAMUSCULAR | Status: DC | PRN
  Administered 2022-10-20 (×2): 50 ug via INTRAVENOUS

## 2022-10-20 MED ORDER — LACTATED RINGERS IV SOLN: INTRAVENOUS | Status: DC

## 2022-10-20 MED ORDER — FENTANYL CITRATE (PF) 250 MCG/5ML IJ SOLN
INTRAMUSCULAR | Status: AC
  Filled 2022-10-20: qty 5

## 2022-10-20 MED ORDER — ORAL CARE MOUTH RINSE: 15.0000 mL | Freq: Once | OROMUCOSAL | Status: DC

## 2022-10-20 MED ORDER — HYDROMORPHONE HCL 1 MG/ML IJ SOLN
0.2500 mg | INTRAMUSCULAR | Status: DC | PRN
  Administered 2022-10-20 (×2): 0.5 mg via INTRAVENOUS

## 2022-10-20 SURGICAL SUPPLY — 54 items
BAG COUNTER SPONGE SURGICOUNT (BAG) ×1 IMPLANT
BAG SPNG CNTER NS LX DISP (BAG)
BIOPATCH RED 1 DISK 7.0 (GAUZE/BANDAGES/DRESSINGS) IMPLANT
BNDG ELASTIC 4X5.8 VLCR STR LF (GAUZE/BANDAGES/DRESSINGS) IMPLANT
BNDG ELASTIC 6X5.8 VLCR STR LF (GAUZE/BANDAGES/DRESSINGS) IMPLANT
BNDG GAUZE DERMACEA FLUFF 4 (GAUZE/BANDAGES/DRESSINGS) IMPLANT
BNDG GZE DERMACEA 4 6PLY (GAUZE/BANDAGES/DRESSINGS)
CANISTER SUCT 3000ML PPV (MISCELLANEOUS) ×1 IMPLANT
CANISTER WOUNDNEG PRESSURE 500 (CANNISTER) IMPLANT
CLIP TI MEDIUM 6 (CLIP) ×1 IMPLANT
CLIP TI WIDE RED SMALL 6 (CLIP) ×1 IMPLANT
COVER SURGICAL LIGHT HANDLE (MISCELLANEOUS) ×1 IMPLANT
DRAPE HALF SHEET 40X57 (DRAPES) IMPLANT
DRAPE INCISE IOBAN 66X45 STRL (DRAPES) IMPLANT
DRAPE U-SHAPE 76X120 STRL (DRAPES) IMPLANT
DRSG CUTIMED SORBACT 7X9 (GAUZE/BANDAGES/DRESSINGS) IMPLANT
DRSG VAC GRANUFOAM MED (GAUZE/BANDAGES/DRESSINGS) IMPLANT
DRSG VERSA FOAM LRG 10X15 (GAUZE/BANDAGES/DRESSINGS) IMPLANT
ELECT REM PT RETURN 9FT ADLT (ELECTROSURGICAL) ×1
ELECTRODE REM PT RTRN 9FT ADLT (ELECTROSURGICAL) ×1 IMPLANT
EVACUATOR SILICONE 100CC (DRAIN) IMPLANT
GAUZE SPONGE 4X4 12PLY STRL (GAUZE/BANDAGES/DRESSINGS) ×1 IMPLANT
GAUZE XEROFORM 5X9 LF (GAUZE/BANDAGES/DRESSINGS) IMPLANT
GLOVE BIO SURGEON STRL SZ7.5 (GLOVE) ×1 IMPLANT
GLOVE BIOGEL PI IND STRL 8 (GLOVE) ×1 IMPLANT
GOWN STRL REUS W/ TWL LRG LVL3 (GOWN DISPOSABLE) ×2 IMPLANT
GOWN STRL REUS W/ TWL XL LVL3 (GOWN DISPOSABLE) ×1 IMPLANT
GOWN STRL REUS W/TWL LRG LVL3 (GOWN DISPOSABLE) ×2
GOWN STRL REUS W/TWL XL LVL3 (GOWN DISPOSABLE) ×1
HANDPIECE INTERPULSE COAX TIP (DISPOSABLE)
IV NS IRRIG 3000ML ARTHROMATIC (IV SOLUTION) ×1 IMPLANT
KIT BASIN OR (CUSTOM PROCEDURE TRAY) ×1 IMPLANT
KIT TURNOVER KIT B (KITS) ×1 IMPLANT
NS IRRIG 1000ML POUR BTL (IV SOLUTION) ×1 IMPLANT
PACK CV ACCESS (CUSTOM PROCEDURE TRAY) IMPLANT
PACK GENERAL/GYN (CUSTOM PROCEDURE TRAY) IMPLANT
PACK UNIVERSAL I (CUSTOM PROCEDURE TRAY) ×1 IMPLANT
PAD ARMBOARD 7.5X6 YLW CONV (MISCELLANEOUS) ×2 IMPLANT
POWDER MYRIAD MORCLLS FINE 500 (Miscellaneous) IMPLANT
PULSAVAC PLUS IRRIG FAN TIP (DISPOSABLE)
PWDR MYRIAD MORCELLS FINE 500 (Miscellaneous) ×1 IMPLANT
SET HNDPC FAN SPRY TIP SCT (DISPOSABLE) IMPLANT
SUT ETHILON 2 0 PSLX (SUTURE) IMPLANT
SUT ETHILON 3 0 PS 1 (SUTURE) IMPLANT
SUT SILK 2 0 (SUTURE) ×1
SUT SILK 2-0 18XBRD TIE 12 (SUTURE) IMPLANT
SUT VIC AB 2-0 CTX 36 (SUTURE) IMPLANT
SUT VIC AB 3-0 SH 27 (SUTURE) ×1
SUT VIC AB 3-0 SH 27X BRD (SUTURE) IMPLANT
SUT VICRYL 4-0 PS2 18IN ABS (SUTURE) IMPLANT
SYR TOOMEY 50ML (SYRINGE) IMPLANT
TIP FAN IRRIG PULSAVAC PLUS (DISPOSABLE) IMPLANT
TOWEL GREEN STERILE (TOWEL DISPOSABLE) ×1 IMPLANT
WATER STERILE IRR 1000ML POUR (IV SOLUTION) ×1 IMPLANT

## 2022-10-20 NOTE — Anesthesia Procedure Notes (Signed)
Procedure Name: LMA Insertion Date/Time: 10/20/2022 11:36 AM  Performed by: Macie Burows, CRNAPre-anesthesia Checklist: Patient identified, Emergency Drugs available, Suction available, Patient being monitored and Timeout performed Patient Re-evaluated:Patient Re-evaluated prior to induction Oxygen Delivery Method: Circle system utilized Preoxygenation: Pre-oxygenation with 100% oxygen Induction Type: IV induction LMA: LMA inserted LMA Size: 5.0 Placement Confirmation: positive ETCO2 and breath sounds checked- equal and bilateral Tube secured with: Tape Dental Injury: Teeth and Oropharynx as per pre-operative assessment

## 2022-10-20 NOTE — Progress Notes (Signed)
ANTICOAGULATION CONSULT NOTE  Pharmacy Consult for Heparin Indication: PE  No Known Allergies  Patient Measurements: Height: 5' 9.5" (176.5 cm) Weight: 92.4 kg (203 lb 11.3 oz) IBW/kg (Calculated) : 71.85 Heparin Dosing Weight: 90.8 kg  Vital Signs: Temp: 98.5 F (36.9 C) (04/08 1400) Temp Source: Oral (04/08 1400) BP: 129/65 (04/08 1400) Pulse Rate: 89 (04/08 1400)  Labs: Recent Labs    10/18/22 0209 10/19/22 0150 10/20/22 0151  HGB 8.7* 7.8* 7.9*  HCT 26.6* 23.8* 24.4*  PLT 130* 144* 179  HEPARINUNFRC 0.52 0.38 0.31  CREATININE 1.09  --  1.09  CKTOTAL 353  --   --      Estimated Creatinine Clearance: 62.3 mL/min (by C-G formula based on SCr of 1.09 mg/dL).   Assessment: 23 yoM admitted with CP with clean cath. CT chest showed PE. Pt started on IV heparin. IR placed bilateral PE lysis catheters 4/2 x12h and heparin continued. Pt with compartment syndrome and taken to OR for fasciotomy 4/3, heparin resumed postop.  Heparin drip 950 uts/hr with Heparin level therapeutic at 0.31(targeting lower goal given recent fasciotomy for radial artery pseudoaneurysm). H/H trending down, pltc improving.  Goal of Therapy:  Heparin level 0.3-0.5 units/ml  Monitor platelets by anticoagulation protocol: Yes   Plan:  Continue heparin 950 units/h Daily heparin level and CBC    Leota Sauers Pharm.D. CPP, BCPS Clinical Pharmacist [phone] 10/20/2022 4:10 PM

## 2022-10-20 NOTE — Anesthesia Postprocedure Evaluation (Signed)
Anesthesia Post Note  Patient: Ralph Blackwell  Procedure(s) Performed: IRRIGATION AND DEBRIDEMENT SKIN, SUBCUTANEOUS TISSUE AND MUSCLE WITH MYRIAD MORSELS AND DRAIN PLACEMENT (Right: Arm Lower) WOUND VAC EXCHANGE (Right: Arm Lower)     Patient location during evaluation: PACU Anesthesia Type: General Level of consciousness: sedated and patient cooperative Pain management: pain level controlled Vital Signs Assessment: post-procedure vital signs reviewed and stable Respiratory status: spontaneous breathing Cardiovascular status: stable Anesthetic complications: no   No notable events documented.  Last Vitals:  Vitals:   10/20/22 1330 10/20/22 1345  BP: (!) 134/57   Pulse: 83   Resp: (!) 21   Temp:  37.1 C  SpO2: 92% 95%    Last Pain:  Vitals:   10/20/22 1345  TempSrc:   PainSc: 5                  Lewie Loron

## 2022-10-20 NOTE — Progress Notes (Signed)
OT Cancellation Note  Patient Details Name: Ralph Blackwell MRN: [id number] DOB: Oct 03, 1940   Cancelled Treatment:    Reason Eval/Treat Not Completed: Patient at procedure or test/ unavailable (Pt is off the floor for surgical debridement.) Patient off floor for further I&D for R arm. OT will follow back as time permits.   Pollyann Glen E. Janeese Mcgloin, OTR/L Acute Rehabilitation Services [phone]   Cherlyn Cushing 10/20/2022, 8:46 AM

## 2022-10-20 NOTE — Op Note (Signed)
Date: October 20, 2022  Preoperative diagnosis:   1. Right forearm compartment syndrome from radial artery pseudoaneurysm after transradial catheterization requiring fasciotomy 2. Open right forearm wound   Postoperative diagnosis: Same   Procedure: 1.  Washout of right forearm fasciotomy including evacuation of hematoma 2.  Sharp excisional debridement of skin, subcutaneous tissue, muscle right forearm wound 3.  Placement of 15 French Blake drain right forearm wound 4.  Placement of 500 mg myriad morsels right forearm wound for skin substitute 5.  VAC change of right forearm fasciotomy wound with white sponge x1 and black sponge x1 (wound measures 12 cm x 8 cm x 2 cm deep)   Surgeon: Dr. Cephus Shelling, MD   Assistant: OR Staff   Indications: 82 year old male that underwent right transradial cath with cardiology on 10/13/22.  Ultimately was found to have PEs and underwent thrombolysis and then developed right radial pseudoaneurysm with hematoma and compartment syndrome.  He underwent evacuation of the hematoma of his forearm with fasciotomies including carpal tunnel release and repair of the pseudoaneurysm last week.  He has a very large right forearm wound from the fasciotomy with significant swelling and edema.  He presents for VAC change and washout.   Findings: There was another hematoma under the muscle belly in the forearm from diffuse oozing from his heparin.  This was evacuated.  The muscle did appear viable.  I placed a 15 [place] round Blake drain into the deeper muscle compartments.  I did debride some skin, subcutaneous tissue and muscle to get the wound bed clean.  We placed myriad morcells in the area of undermining after the wound was copiously irrigated and washed out.  A sorbact dressing was placed over the muscle..  A white and then black VAC sponge were applied to the wound with track pad and ioban with good seal.   Anesthesia: General   Details: Patient was taken to the  operating room after informed consent was obtained.  Placed on operative table supine position.  General endotracheal anesthesia was induced.  The previous VAC dressing to the right forearm was removed and the right arm was prepped and draped in standard sterile fashion.  Antibiotics were given and timeout performed.  I reexplored the forearm wound and again there was a sizable hematoma underneath the muscle bellies in the deeper compartment that was evacuated.  I copiously irrigated the wound until the effluent was clear and got all the old Surgicel removed.  The muscle did appear viable in all the compartments and did appear to have better contractility today.  Ultimately I tunneled a 15 [place] round Blake drain out the skin of the subcutaneous tissue in the forearm and placed the drain into the deeper compartment of the forearm to help evacuate any future hematoma accumulation.  This is complicated by him being on heparin for his PEs.  I did debride some of the subcutaneous tissue as well as skin and muscle to clean up the wound.  The wound still measures about the same size 12 cm long by 8 cm wide by 2 cm deep.  I placed 500 mg myriad morcells skin substitute in the wound where there was undermining.  I placed a Sorbact dressing over top of this with an apple white and black VAC sponge and this was secured with Ioban and a track pad with good seal.   Complication: None   Condition: Stable, back to ICU  Cephus Shelling, MD [facility]  Office: [phone]   Cephus Shelling

## 2022-10-20 NOTE — Anesthesia Preprocedure Evaluation (Addendum)
Anesthesia Evaluation  Patient identified by MRN, date of birth, ID band Patient awake    Reviewed: Allergy & Precautions, H&P , NPO status , Patient's Chart, lab work & pertinent test results  History of Anesthesia Complications Negative for: history of anesthetic complications  Airway Mallampati: II  TM Distance: >3 FB Neck ROM: Full    Dental no notable dental hx. (+) Dental Advisory Given   Pulmonary neg pulmonary ROS, sleep apnea , PE (saddle pulmonary embolus underwent catheter directed thrombolysis)   Pulmonary exam normal breath sounds clear to auscultation       Cardiovascular hypertension, Pt. on medications and Pt. on home beta blockers pulmonary hypertension+ angina  + CAD  Normal cardiovascular exam Rhythm:Regular Rate:Normal  10/13/2022 cath: LM: Normal LAD: Minimal luminal irregularities Lcx: Large, dominant. Normal RCA: Small, nondominant.  Normal  LVEDP normal    No angiographically evident significant coronary artery disease  10/12/2022 ECHO: 1. EF 65 to 70%. The LV has normal function. The left ventricle has no regional wall motion abnormalities. Left ventricular diastolic parameters are  consistent with Grade I diastolic dysfunction (impaired relaxation).   2. Right ventricular systolic function is normal. The right ventricular  size is normal. There is severely elevated pulmonary artery systolic  pressure. The estimated right ventricular systolic pressure is 69.2 mmHg.   3. The mitral valve is grossly normal. Mild mitral valve regurgitation.  No evidence of mitral stenosis.   4. The tricuspid valve is abnormal. Tricuspid valve regurgitation is  moderate.   5. The aortic valve is tricuspid. There is mild calcification of the  aortic valve. Aortic valve regurgitation is mild. No aortic stenosis     Neuro/Psych negative neurological ROS  negative psych ROS   GI/Hepatic negative GI ROS, Neg liver ROS,  hiatal hernia,GERD  Medicated and Controlled,,  Endo/Other  negative endocrine ROS    Renal/GU Renal InsufficiencyRenal diseasenegative Renal ROS     Musculoskeletal   Abdominal   Peds  Hematology negative hematology ROS (+) Hb 13.5, plt 56k: plts infusing heparin   Anesthesia Other Findings   Reproductive/Obstetrics                             Anesthesia Physical Anesthesia Plan  ASA: 3  Anesthesia Plan: General   Post-op Pain Management: Tylenol PO (pre-op)*   Induction: Intravenous  PONV Risk Score and Plan: 2 and Ondansetron, Dexamethasone and Treatment may vary due to age or medical condition  Airway Management Planned: Oral ETT and LMA  Additional Equipment: None  Intra-op Plan:   Post-operative Plan: Extubation in OR  Informed Consent: I have reviewed the patients History and Physical, chart, labs and discussed the procedure including the risks, benefits and alternatives for the proposed anesthesia with the patient or authorized representative who has indicated his/her understanding and acceptance.     Dental advisory given  Plan Discussed with: CRNA  Anesthesia Plan Comments:         Anesthesia Quick Evaluation

## 2022-10-20 NOTE — Progress Notes (Signed)
PT Cancellation Note  Patient Details Name: [name] MRN: [id number] DOB: 14-Oct-1940   Cancelled Treatment:    Reason Eval/Treat Not Completed: (P) Fatigue/lethargy limiting ability to participate Pt reports just getting back from surgery and he is still groggy. PT will follow back later as able.  Ralph Blackwell B. Beverely Risen PT, DPT Acute Rehabilitation Services Please use secure chat or  Call Office [phone]    Ralph Blackwell [employer] [facility] 10/20/2022, 2:21 PM

## 2022-10-20 NOTE — Transfer of Care (Signed)
Immediate Anesthesia Transfer of Care Note  Patient: Ralph Blackwell  Procedure(s) Performed: IRRIGATION AND DEBRIDEMENT SKIN, SUBCUTANEOUS TISSUE AND MUSCLE WITH MYRIAD MORSELS AND DRAIN PLACEMENT (Right: Arm Lower) WOUND VAC EXCHANGE (Right: Arm Lower)  Patient Location: PACU  Anesthesia Type:General  Level of Consciousness: awake, alert , and oriented  Airway & Oxygen Therapy: Patient Spontanous Breathing  Post-op Assessment: Report given to RN and Post -op Vital signs reviewed and stable  Post vital signs: Reviewed and stable  Last Vitals:  Vitals Value Taken Time  BP 142/62 10/20/22 1251  Temp    Pulse 89 10/20/22 1258  Resp 10 10/20/22 1258  SpO2 93 % 10/20/22 1258  Vitals shown include unvalidated device data.  Last Pain:  Vitals:   10/20/22 0807  TempSrc: Oral  PainSc:       Patients Stated Pain Goal: 0 (10/19/22 1600)  Complications: No notable events documented.

## 2022-10-20 NOTE — Progress Notes (Signed)
NAME:  WEIR CROFF, MRN:  [id number], DOB:  14-Aug-1940, LOS: 8 ADMISSION DATE:  10/11/2022, CONSULTATION DATE:  10/13/2022 REFERRING MD:  Dr. Jacqulyn Bath, CHIEF COMPLAINT:  SOB, Chest Pain   History of Present Illness:  82 year old man who presented to [facility] ED 3/30 with progressive SOB over several weeks.  The patient reports they traveled approximately 3 weeks ago to the [place].  While on vacation he walked 2 miles per day.  He typically is normally very active.  He notes when he returned home he was taking his dog out to go to to the bathroom and he developed increasing shortness of breath and chest tightness.  He describes it as a "vice on his chest". He states he did not feel like he was good to be able to make it back into the house.  He attempted sublingual nitro and he did have some improvement in symptoms.  Noting chest pain radiated to both arms.  He was evaluated by Cardiology on presentation and was thought to have unstable angina.  In route with EMS the patient was reportedly very diaphoretic and short of breath.  He was started on nitro and heparin infusion.  He continued to have chest pain with shortness of breath with minimal activity such as brushing teeth.  He was taken for left heart cath on 4/1 with findings of no angiographically evident significant coronary artery disease.  No acute findings to suggest cause of patient's chest pain.  Prior echocardiogram performed approximately a week before admission showed an RVSP of 28 mmHg.  Repeat ECHO this admission showed RVSP of 69 mmHg.  The patient subsequently had a CT angio of the chest which was positive for saddle PE with right greater than left pulmonary emboli, noted CT evidence of right heart strain with RV LV ratio of 1.18 consistent with at least submassive PE.  Patient was evaluated by interventional radiology.  PCCM consulted for evaluation   Pertinent Medical History:  CAD HTN Pulmonary embolism-2008 after knee surgery,  subsequent fall with wrist fracture OSA Renal insufficiency GERD Gout Hiatal hernia  Significant Hospital Events: Including procedures, antibiotic start and stop dates in addition to other pertinent events   3/30 admit with SOB, chest pain 4/1 LHC with no angiographically evident coronary artery disease to suggest cause of chest pain.  Noted elevated RVSP on echo. CTA completed & positive for saddle PE. IR/PCCM consulted.  4/2 hemodynamically stable, though had an episode of CP this morning 4/3 Acute onset of RUE hand/wrist/forearm swelling, c/f compartment syndrome. VVS/Hand Surgery consulted. Plan for OR for fasciotomy. 4/4 Escalating hand pain late morning with cold fingers, loss of pleth, loss of palpable pulse/doppler only. Dressing loosened with some relief. Later in afternoon, ambulating with continued improvement in swelling. 4/5 OR with VVS for washout/WV replacement and hematoma evacuation, tolerated well. Surgicel placed in wound bed. 4/8 OR with VVS for washout/WV replacement and hematoma evacuation. WV replaced, Blake drain placed under forearm muscle belly.  Interim History / Subjective:  POD#0 from second R forearm washout/WV replacement Intrap findings were notable for hematoma under forearm muscle belly, prompting hematoma evacuation and 15Fr Blake drain placement WV replaced with serosanguinous output Feeling well overall, no pain at present Breathing is better, satting >93% on RA Appetite is significantly improved  Objective:  Blood pressure (!) 131/59, pulse 82, temperature 98.1 F (36.7 C), resp. rate 17, height 5' 9.5" (1.765 m), weight 92.4 kg, SpO2 96 %.        Intake/Output  Summary (Last 24 hours) at 10/20/2022 1004 Last data filed at 10/20/2022 0800 Gross per 24 hour  Intake 477.51 ml  Output 2350 ml  Net -1872.49 ml    Filed Weights   10/14/22 0300 10/15/22 1417 10/18/22 0500  Weight: 92.4 kg 93 kg 92.4 kg   Physical Examination: General: Overall  well-appearing elderly man in NAD. Pleasant and conversant HEENT: [place]/AT, anicteric sclera, PERRL, moist mucous membranes. Neuro: Awake, oriented x 4. Responds to verbal stimuli. Following commands consistently. Moves all 4 extremities spontaneously.  CV: RRR, no m/g/r. PULM: Breathing even and unlabored on RA. Lung fields CTAB anteriorly. GI: Soft, nontender, nondistended. Normoactive bowel sounds. Extremities: No LE edema noted or edema of LUE. RUE diffusely edematous and ecchymotic from fingertips to elbow, WV in place with serosanguinous output. 15Fr Blake drain in place with sanguinous output. Extremity is elevated with maintenance of motor and sensation. Skin: Warm/dry, ecchymosis of RUE as described above.  Resolved Hospital Problem List:   Hyponatremia  Assessment & Plan:   Compartment syndrome of the right forearm, hand swelling and hematoma from bleeding into the forearm from previous catheter access site.  Unfortunately this is an iatrogenic complication of tPA administration for pulmonary embolism. Radial artery pseudoaneurysm, status post repair - VVS following, appreciate management - POD#0 from second R forearm washout/hematoma evacuation/WV replacement - WV/Blake drain management per VVS - Postoperative pain management (APAP, oxycodone, Fentanyl) - Continue to elevate the extremity - Hand ROM exercises as tolerated - Additional debridements/washouts/WV replacement tentatively planned for 4/10   Submassive pulmonary embolism with persistent chest pain, status post catheter directed lytics - Heparin gtt - Cardiac monitoring - Repeat Echo as outpatient to evaluate RV function recovery - Will require minimum 3 months AC; assess risk vs. benefit of longer-term AC  Acute blood loss anemia due to bleeding into forearm - Trend H&H - Monitor for signs of active bleeding - Transfuse for Hgb < 7.0 or hemodynamically significant bleeding - Keep active T&S while having additional  procedures  Thrombocytopenia-unknown chronicity.  He reports that this would have been picked up by his PCP had it been more chronic.  Improving. - Trend Plt - No need for transfusion at present   Hyperglycemia - SSI - CBGs Q4H - Goal CBG 140-180  Constipation - Bowel regimen - Encourage mobility, OOB  Hyperkalemia suspect this was due to hemolysis not recognized in the lab given rapid correction - Trend BMP - Resolved 4/8AM  History of gout - Continue allopurinol  BPH - Continue Proscar (home med)  History of hypertension - Continue low-dose metoprolol - Hold PTA ARB for now  GERD - PPI  Best Practice: (right click and "Reselect all SmartList Selections" daily)  Diet/type: Regular consistency (see orders), NPO p mn DVT prophylaxis: systemic heparin GI prophylaxis: PPI Lines: N/A Foley:  N/A Code Status:  full code Last date of multidisciplinary goals of care discussion: Full code, family updated at the bedside 4/8  Critical care time: N/A   Faythe Ghee [facility] 10/20/22 10:06 AM  Please see [email or web address] for pager details.  From 7A-7P if no response, please call [phone] After hours, please call ELink [phone]

## 2022-10-20 NOTE — TOC Initial Note (Signed)
Transition of Care [facility]) - Initial/Assessment Note    Patient Details  Name: Ralph Blackwell MRN: [id number] Date of Birth: 1940-12-04  Transition of [facility] CM/SW Contact:    Gala Lewandowsky, RN Phone Number: 10/20/2022, 4:01 PM  Clinical Narrative: Patient presented for chest pain-found to have a submassive pulmonary embolism. Per MD notes, post procedure developed sever swelling and found pseudoaneurysm. Vascular is following the patient-post right forearm irrigation and debridement and wound vac change. Patient was discussed in progression rounds- no orders have been placed for wound vac for home. Following the patient for transition of care needs as he progresses.                  Expected Discharge Plan: Home w Home Health Services Barriers to Discharge: Continued Medical Work up  Expected Discharge Plan and Services     Post Acute Care Choice: Home Health Living arrangements for the past 2 months: Single Family Home  Prior Living Arrangements/Services Living arrangements for the past 2 months: Single Family Home Lives with:: Spouse Patient language and need for interpreter reviewed:: Yes        Need for Family Participation in Patient Care: Yes (Comment) Care giver support system in place?: Yes (comment)   Criminal Activity/Legal Involvement Pertinent to Current Situation/Hospitalization: No - Comment as needed  Activities of Daily Living Home Assistive Devices/Equipment: CBG Meter, Eyeglasses ADL Screening (condition at time of admission) Patient's cognitive ability adequate to safely complete daily activities?: Yes Is the patient deaf or have difficulty hearing?: No Does the patient have difficulty seeing, even when wearing glasses/contacts?: No Does the patient have difficulty concentrating, remembering, or making decisions?: No Patient able to express need for assistance with ADLs?: Yes Does the patient have difficulty dressing or bathing?:  No Independently performs ADLs?: Yes (appropriate for developmental age) Does the patient have difficulty walking or climbing stairs?: No Weakness of Legs: None Weakness of Arms/Hands: None  Emotional Assessment Appearance:: Appears stated age         Psych Involvement: No (comment)  Admission diagnosis:  Unstable angina [I20.0] NSTEMI (non-ST elevated myocardial infarction) [I21.4] Atypical angina [I20.89] Patient Active Problem List   Diagnosis Date Noted   Hematoma of forearm 10/15/2022   Compartment syndrome of right hand 10/15/2022   Thrombocytopenia, unspecified 10/15/2022   Acute pulmonary embolism with acute cor pulmonale 10/14/2022   Atypical angina 10/12/2022   Thrombocytopenia 10/11/2022   Nonrheumatic aortic valve insufficiency 06/30/2022   Mixed hyperlipidemia 04/17/2022   Premature atrial contraction 02/20/2022   Essential hypertension 02/20/2020   Coronary artery disease involving native coronary artery of native heart without angina pectoris 02/20/2020   Unstable angina 06/23/2017   Aortic ectasia, abdominal 09/05/2016   Iliac artery aneurysm 08/08/2015   PCP:  Joya Martyr, MD (Inactive) Pharmacy:   [facility] - [place], [place] - [address] [place] [address] Phone: [phone] Fax: [phone]     Social Determinants of Health (SDOH) Social History: SDOH Screenings   Food Insecurity: No Food Insecurity (10/13/2022)  Housing: Low Risk  (10/13/2022)  Transportation Needs: No Transportation Needs (10/13/2022)  Utilities: Not At Risk (10/13/2022)  Tobacco Use: Low Risk  (10/20/2022)   Readmission Risk Interventions     No data to display

## 2022-10-20 NOTE — Progress Notes (Addendum)
  Progress Note    10/20/2022 7:41 AM 3 Days Post-Op  Scheduled today for right forearm Irrigation and debridement of Faciotomy site with wound VAC change with Dr. Chestine Spore in OR Patients questions answered Keep NPO Consent order placed  Dory Horn [facility] [phone] 10/20/2022 7:41 AM  I have seen and evaluated the patient. I agree with the PA note as documented above.  Plan washout and irrigation of right forearm fasciotomy wound with VAC change.  Possible skin substitute placement.  Palpable radial pulse at the wrist.  Swelling is better through the weekend but still significant.  Cephus Shelling, MD [facility] [place] Office: [phone]

## 2022-10-20 NOTE — Progress Notes (Signed)
Occupational Therapy Treatment Patient Details Name: Ralph Blackwell MRN: [id number] DOB: [birth year] Today's Date: 10/20/2022   History of present illness 82 year old male that underwent right transradial cath with cardiology on 10/13/22.   Ultimately was found to have PEs and underwent thrombolysis and then developed right radial pseudoaneurysm with hematoma and compartment syndrome.  He underwent evacuation of the hematoma of his forearm with fasciotomies including carpal tunnel release and repair of the pseudoaneurysm. Underwent wash out and vac change 10/17/22. PMH: CAD, HTN, PE, OSA, renal insufficiency, GERD, Gout, Hiatal Hernia.   OT comments  Pt continuing to progress towards patient focused goals. Pt completing bed mobility and functional ambulation with Min guard to Supervision but remains limited by HR with functional activity. HR spiked to 172 during ambulation prompting an end to OT session. Pt demonstrates good carryover of RUE edema management strategies, keeps it elevated at all times when possible. Educated Pt on pink therasponge squeezes, tendon glides, and AROM of wrist to help with swelling and maintain joint integrity, Pt demonstrated and verbalized understanding. OT to continue to progress Pt as able.   Recommendations for follow up therapy are one component of a multi-disciplinary discharge planning process, led by the attending physician.  Recommendations may be updated based on patient status, additional functional criteria and insurance authorization.    Assistance Recommended at Discharge Intermittent Supervision/Assistance  Patient can return home with the following  A little help with bathing/dressing/bathroom;Assistance with cooking/housework;Assist for transportation   Equipment Recommendations  Tub/shower seat    Recommendations for Other Services      Precautions / Restrictions Precautions Precaution Comments: R arm wound vac, RUE elevated Restrictions Weight  Bearing Restrictions: No       Mobility Bed Mobility Overal bed mobility: Needs Assistance Bed Mobility: Supine to Sit, Sit to Supine     Supine to sit: Supervision Sit to supine: Min guard   General bed mobility comments: increased time needed for patient to scoot hips when returning to bed    Transfers Overall transfer level: Needs assistance Equipment used:  Carley Hammed walker) Transfers: Sit to/from Stand Sit to Stand: Min guard                 Balance Overall balance assessment: Modified Independent                                         ADL either performed or assessed with clinical judgement   ADL Overall ADL's : Needs assistance/impaired                 Upper Body Dressing : Sitting;Maximal assistance Upper Body Dressing Details (indicate cue type and reason): Donned gown in jacket like fashion                 Functional mobility during ADLs: Min guard (+ Carley Hammed walker for RUE support)      Extremity/Trunk Assessment              Vision       Perception     Praxis      Cognition Arousal/Alertness: Awake/alert Behavior During Therapy: WFL for tasks assessed/performed Overall Cognitive Status: Within Functional Limits for tasks assessed  Exercises Other Exercises Other Exercises: Pink therasponge squeezes RUE x10 reps Other Exercises: Tendon glides (hook, table, and full fist) x5 reps RUE    Shoulder Instructions       General Comments HR resting in the 110s while supine in bed prior to session, increased to 130-140 upon standing. HR peaked at 172 during functional ambulation resulting in an end of therapy session. HR returned back to 138 when resting supine in bed at end of session    Pertinent Vitals/ Pain       Pain Assessment Pain Assessment: No/denies pain  Home Living                                          Prior  Functioning/Environment              Frequency  Min 3X/week        Progress Toward Goals  OT Goals(current goals can now be found in the care plan section)  Progress towards OT goals: Progressing toward goals  Acute Rehab OT Goals Patient Stated Goal: To get better; hand to get better OT Goal Formulation: With patient Time For Goal Achievement: 10/31/22 Potential to Achieve Goals: Good  Plan Frequency remains appropriate;Discharge plan remains appropriate    Co-evaluation                 AM-PAC OT "6 Clicks" Daily Activity     Outcome Measure   Help from another person eating meals?: A Little Help from another person taking care of personal grooming?: A Little Help from another person toileting, which includes using toliet, bedpan, or urinal?: A Little Help from another person bathing (including washing, rinsing, drying)?: A Lot Help from another person to put on and taking off regular upper body clothing?: A Lot Help from another person to put on and taking off regular lower body clothing?: A Lot 6 Click Score: 15    End of Session Equipment Utilized During Treatment: Gait belt (EVA walker)  OT Visit Diagnosis: Muscle weakness (generalized) (M62.81);Pain   Activity Tolerance Patient tolerated treatment well   Patient Left in chair;with call bell/phone within reach;with family/visitor present   Nurse Communication Mobility status;Other (comment) (Elevated HR)        Time: 7001-7494 OT Time Calculation (min): 43 min  Charges: OT General Charges $OT Visit: 1 Visit OT Treatments $Therapeutic Activity: 23-37 mins $Therapeutic Exercise: 8-22 mins  10/20/2022  AB, OTR/L  Acute Rehabilitation Services  Office: [phone]   Tristan Schroeder 10/20/2022, 6:26 PM

## 2022-10-20 NOTE — Progress Notes (Signed)
PT Cancellation Note  Patient Details Name: Ralph Blackwell MRN: [id number] DOB: 07/04/1941   Cancelled Treatment:    Reason Eval/Treat Not Completed: (P) Patient at procedure or test/unavailable Pt is off floor for R arm I&D and wound vac replacement. PT will follow back this afternoon as able.  Zanae Kuehnle B. Beverely Risen PT, DPT Acute Rehabilitation Services Please use secure chat or  Call Office [phone]    [facility] 10/20/2022, 9:25 AM

## 2022-10-21 ENCOUNTER — Encounter (HOSPITAL_COMMUNITY): Payer: Self-pay | Admitting: Vascular Surgery

## 2022-10-21 DIAGNOSIS — S5010XA Contusion of unspecified forearm, initial encounter: Secondary | ICD-10-CM

## 2022-10-21 DIAGNOSIS — I2602 Saddle embolus of pulmonary artery with acute cor pulmonale: Secondary | ICD-10-CM | POA: Diagnosis not present

## 2022-10-21 DIAGNOSIS — I480 Paroxysmal atrial fibrillation: Secondary | ICD-10-CM

## 2022-10-21 DIAGNOSIS — T79A11A Traumatic compartment syndrome of right upper extremity, initial encounter: Secondary | ICD-10-CM | POA: Diagnosis not present

## 2022-10-21 LAB — BASIC METABOLIC PANEL
Anion gap: 7 (ref 5–15)
BUN: 28 mg/dL — ABNORMAL HIGH (ref 8–23)
CO2: 25 mmol/L (ref 22–32)
Calcium: 8.5 mg/dL — ABNORMAL LOW (ref 8.9–10.3)
Chloride: 101 mmol/L (ref 98–111)
Creatinine, Ser: 1.05 mg/dL (ref 0.61–1.24)
GFR, Estimated: >60 mL/min (ref >60)
Glucose, Bld: 134 mg/dL — ABNORMAL HIGH (ref 70–99)
Potassium: 4.7 mmol/L (ref 3.5–5.1)
Sodium: 133 mmol/L — ABNORMAL LOW (ref 135–145)

## 2022-10-21 LAB — GLUCOSE, CAPILLARY
Glucose-Capillary: 205 mg/dL — ABNORMAL HIGH (ref 70–99)
Glucose-Capillary: 124 mg/dL — ABNORMAL HIGH (ref 70–99)
Glucose-Capillary: 170 mg/dL — ABNORMAL HIGH (ref 70–99)
Glucose-Capillary: 155 mg/dL — ABNORMAL HIGH (ref 70–99)

## 2022-10-21 LAB — CBC
HCT: 23.5 % — ABNORMAL LOW (ref 39.0–52.0)
Hemoglobin: 7.6 g/dL — ABNORMAL LOW (ref 13.0–17.0)
MCH: 30.3 pg (ref 26.0–34.0)
MCHC: 32.3 g/dL (ref 30.0–36.0)
MCV: 93.6 fL (ref 80.0–100.0)
Platelets: 202 10*3/uL (ref 150–400)
RBC: 2.51 MIL/uL — ABNORMAL LOW (ref 4.22–5.81)
RDW: 15.2 % (ref 11.5–15.5)
WBC: 12.4 10*3/uL — ABNORMAL HIGH (ref 4.0–10.5)
nRBC: 1.4 % — ABNORMAL HIGH (ref 0.0–0.2)

## 2022-10-21 LAB — HEPARIN LEVEL (UNFRACTIONATED)
Heparin Unfractionated: 0.21 IU/mL — ABNORMAL LOW (ref 0.30–0.70)
Heparin Unfractionated: 0.2 IU/mL — ABNORMAL LOW (ref 0.30–0.70)
Heparin Unfractionated: 0.29 IU/mL — ABNORMAL LOW (ref 0.30–0.70)

## 2022-10-21 LAB — MAGNESIUM: Magnesium: 2.2 mg/dL (ref 1.7–2.4)

## 2022-10-21 NOTE — Progress Notes (Signed)
Subjective:  Right forearm swelling improving Wound vac and drain in place   Current Facility-Administered Medications:    0.9 %  sodium chloride infusion (Manually program via Guardrails IV Fluids), , Intravenous, Once, Schuh, McKenzi P, PA-C   0.9 %  sodium chloride infusion, 250 mL, Intravenous, PRN, Schuh, McKenzi P, PA-C   0.9 %  sodium chloride infusion, 250 mL, Intravenous, PRN, Schuh, McKenzi P, PA-C   0.9 %  sodium chloride infusion, , Intravenous, Continuous, Schuh, McKenzi P, PA-C, Stopped at 10/15/22 1235   0.9 %  sodium chloride infusion, , Intravenous, Continuous, Schuh, McKenzi P, PA-C, Stopped at 10/15/22 1235   acetaminophen (TYLENOL) tablet 650 mg, 650 mg, Oral, Q4H PRN, Schuh, McKenzi P, PA-C, 650 mg at 10/20/22 0508   allopurinol (ZYLOPRIM) tablet 150 mg, 150 mg, Oral, Daily, Schuh, McKenzi P, PA-C, 150 mg at 10/21/22 0919   aspirin EC tablet 81 mg, 81 mg, Oral, Daily, Schuh, McKenzi P, PA-C, 81 mg at 10/21/22 0919   Chlorhexidine Gluconate Cloth 2 % PADS 6 each, 6 each, Topical, Daily, Schuh, McKenzi P, PA-C, 6 each at 10/21/22 0926   fentaNYL (SUBLIMAZE) injection 25-50 mcg, 25-50 mcg, Intravenous, Q2H PRN, Schuh, McKenzi P, PA-C, 50 mcg at 10/21/22 0724   finasteride (PROSCAR) tablet 5 mg, 5 mg, Oral, QHS, Schuh, McKenzi P, PA-C, 5 mg at 10/20/22 2206   heparin ADULT infusion 100 units/mL (25000 units/257mL), 1,050 Units/hr, Intravenous, Continuous, Chand, Sudham, MD, Last Rate: 9.5 mL/hr at 10/21/22 0700, 950 Units/hr at 10/21/22 0700   insulin aspart (novoLOG) injection 1-3 Units, 1-3 Units, Subcutaneous, TID PC & HS, Schuh, McKenzi P, PA-C, 3 Units at 10/21/22 0920   metoprolol tartrate (LOPRESSOR) tablet 12.5 mg, 12.5 mg, Oral, BID, Schuh, McKenzi P, PA-C, 12.5 mg at 10/21/22 0919   ondansetron (ZOFRAN) injection 4 mg, 4 mg, Intravenous, Q6H PRN, Schuh, McKenzi P, PA-C, 4 mg at 10/15/22 1607   Oral care mouth rinse, 15 mL, Mouth Rinse, PRN, Schuh, McKenzi P, PA-C    oxyCODONE (Oxy IR/ROXICODONE) immediate release tablet 5-10 mg, 5-10 mg, Oral, Q4H PRN, Schuh, McKenzi P, PA-C, 10 mg at 10/21/22 0923   pantoprazole (PROTONIX) EC tablet 80 mg, 80 mg, Oral, Daily, Schuh, McKenzi P, PA-C, 80 mg at 10/21/22 0919   polyethylene glycol (MIRALAX / GLYCOLAX) packet 17 g, 17 g, Oral, Daily PRN, Schuh, McKenzi P, PA-C   rosuvastatin (CRESTOR) tablet 40 mg, 40 mg, Oral, Daily, Schuh, McKenzi P, PA-C, 40 mg at 10/20/22 2206   senna (SENOKOT) tablet 8.6 mg, 1 tablet, Oral, Daily PRN, Schuh, McKenzi P, PA-C, 8.6 mg at 10/19/22 0952   sodium chloride flush (NS) 0.9 % injection 3 mL, 3 mL, Intravenous, Q12H, Schuh, McKenzi P, PA-C, 3 mL at 10/21/22 0926   sodium chloride flush (NS) 0.9 % injection 3 mL, 3 mL, Intravenous, PRN, Schuh, McKenzi P, PA-C   sodium chloride flush (NS) 0.9 % injection 3 mL, 3 mL, Intravenous, Q12H, Schuh, McKenzi P, PA-C, 3 mL at 10/21/22 0925   sodium chloride flush (NS) 0.9 % injection 3 mL, 3 mL, Intravenous, PRN, Schuh, McKenzi P, PA-C   Objective:  Vital Signs in the last 24 hours: Temp:  [98.3 F (36.8 C)-98.8 F (37.1 C)] 98.3 F (36.8 C) (04/09 0755) Pulse Rate:  [76-150] 124 (04/09 0700) Resp:  [8-22] 17 (04/09 0700) BP: (110-142)/(57-70) 121/66 (04/09 0700) SpO2:  [87 %-96 %] 90 % (04/09 0700)  Intake/Output from previous day: 04/08 0701 - 04/09 0700 In: 284.3 [  I.V.:284.3] Out: 1905 [Urine:1750; Drains:155]  Physical Exam Vitals and nursing note reviewed.  Constitutional:      General: He is not in acute distress. Neck:     Vascular: No JVD.  Cardiovascular:     Rate and Rhythm: Normal rate and regular rhythm.     Heart sounds: Normal heart sounds. No murmur heard.    Comments: Rt forearm grade IV hematoma Superficial skin blebs and ecchymosis Intact radial and ulnar pulses Pulmonary:     Effort: Pulmonary effort is normal.     Breath sounds: Normal breath sounds. No wheezing or rales.  Musculoskeletal:     Right  lower leg: No edema.     Left lower leg: No edema.      Imaging/tests reviewed and independently interpreted:  Coronary angiogram 10/13/2022: LM: Normal LAD: Minimal luminal irregularities Lcx: Large, dominant. Normal RCA: Small, nondominant.  Normal   LVEDP normal   Resting Pd/Pa: 0.93 FFR: 0.88 CFR: 4.5 IMR: 27 (only marginally abnormal)   No angiographically evident significant coronary artery disease Nearly normal microvascular indicis   Above findings do not explain the severity of patient's chest pain shortness of breath.   I personally reviewed and compared echocardiograms.  Echocardiogram performed outpatient last week showed RVSP of 28 mmHg.  RVSP was noted to be 69 mmHg on inpatient echocardiogram yesterday.  Patient reportedly has history of PEs in the past.   Given significant only different RVSP, patient's profound symptoms not explained by coronary or cardiac etiology, I strongly suspect acute pulmonary cause such as pulmonary embolism.  I personally discussed this with patient and patient's wife, as well as hospitalist Dr. Beverly Gust.  We will proceed with CT angiogram chest, with additional hydration.  If CT angiogram does not reveal acute PE, I will perform right heart catheterization tomorrow.  CTA chest 10/13/2022: 1. Positive for pulmonary emboli. Right greater than left pulmonary emboli with a saddle pulmonary embolus as detailed. Positive for acute PE with CT evidence of right heart strain (RV/LV Ratio = 1.18) consistent with at least submassive (intermediate risk) PE. The presence of right heart strain has been associated with an increased risk of morbidity and mortality. Please refer to the "Code PE Focused" order set in EPIC. 2. No other acute finding. 3. Significant enlargement of the left thyroid lobe that extends substernally. Recommend thyroid ultrasound (ref: J Am Coll Radiol. 2015 Feb;12(2): 143-50).   Aortic Atherosclerosis (ICD10-I70.0).  Cardiac  Studies:  Telemetry 10/21/2022: Brief Afib, followed by sinus tachycardia this morning  EKG 10/19/2022: Sinus tachycardia with Premature atrial complexes Nonspecific T wave abnormality  Echocardiogram 10/12/2022: 1. Left ventricular ejection fraction, by estimation, is 65 to 70%. The  left ventricle has normal function. The left ventricle has no regional  wall motion abnormalities. Left ventricular diastolic parameters are  consistent with Grade I diastolic  dysfunction (impaired relaxation).   2. Right ventricular systolic function is normal. The right ventricular  size is normal. There is severely elevated pulmonary artery systolic  pressure. The estimated right ventricular systolic pressure is 69.2 mmHg.   3. The mitral valve is grossly normal. Mild mitral valve regurgitation.  No evidence of mitral stenosis.   4. The tricuspid valve is abnormal. Tricuspid valve regurgitation is  moderate.   5. The aortic valve is tricuspid. There is mild calcification of the  aortic valve. Aortic valve regurgitation is mild. No aortic stenosis is  present.   6. There is Moderate (Grade III) plaque involving the descending aorta.  7. The inferior vena cava is normal in size with <50% respiratory  variability, suggesting right atrial pressure of 8 mmHg.   Comparison(s): No prior Echocardiogram.    Assessment & Recommendations:  82 year old Caucasian male with hypertension, hyperlipidemia, nonobstructive coronary artery disease, acute PE requiring thrombectomy and lytics, complicated Rt forearm compartment syndrome from previously healed rt radial arteriotomy site (for cardiac cath 10/13/2022), now s/p fasciotomy and evacuation of hematoma  Rt forearm hematoma: S/p fasciotomy and hematoma evacuation by vascular surgery. Wound vac and drain in place Plan for VAC change on Friday  PAF: Brief Afib this morning. Already on heparin. Will monitor today and see if he would need additional rate  control.  PE: Acute submassive PE s/p catheter directed thrombolysis on anticoagulation  Continue heparin Recurrent DVT/PE, possibly unprovoked Will need long term anticoagulation, when okay by vascular surgery   Discussed interpretation of tests and management recommendations with the primary team   Elder NegusManish J Viana Sleep, MD Pager: [phone] Office: [phone]

## 2022-10-21 NOTE — Progress Notes (Signed)
ANTICOAGULATION CONSULT NOTE Pharmacy Consult for Heparin Indication: PE Brief A/P: Heparin level subtherapeutic Increase Heparin rate   No Known Allergies  Patient Measurements: Height: 5' 9.5" (176.5 cm) Weight: 92.4 kg (203 lb 11.3 oz) IBW/kg (Calculated) : 71.85 Heparin Dosing Weight: 90.8 kg  Vital Signs: BP: 112/62 (04/09 0400) Pulse Rate: 85 (04/09 0400)  Labs: Recent Labs    10/19/22 0150 10/20/22 0151 10/21/22 0335  HGB 7.8* 7.9* 7.6*  HCT 23.8* 24.4* 23.5*  PLT 144* 179 202  HEPARINUNFRC 0.38 0.31 0.21*  CREATININE  --  1.09 1.05     Estimated Creatinine Clearance: 64.6 mL/min (by C-G formula based on SCr of 1.05 mg/dL).   Assessment: 82 yo male with PE.s/p lysis 4/2 for heparin  Goal of Therapy:  Heparin level 0.3-0.5 units/ml  Monitor platelets by anticoagulation protocol: Yes   Plan:  Increase Heparin 1050 units/hr Check heparin level in 6 hours.   Geannie Risen, PharmD, BCPS  10/21/2022 4:59 AM

## 2022-10-21 NOTE — Progress Notes (Addendum)
  Progress Note    10/21/2022 7:36 AM 1 Day Post-Op  Subjective:  right arm sore but overall says pain well controlled. Sitting up in chair eating breakfast. Helped him get arm elevated back onto pillows   Vitals:   10/21/22 0600 10/21/22 0700  BP: 124/60 121/66  Pulse: 93 (!) 124  Resp: 11 17  Temp:    SpO2: 93% 90%   Physical Exam: Cardiac: irregular, tachy Lungs:  non labored Incisions:  right forearm VAC to suction, good seal, JP drain with 25 cc SS output Extremities:  Right arm well perfused and warm with palpable radial pulse. Hand warm. 3/5 grip strength. Motor and sensation intact Neurologic: alert and oriented  CBC    Component Value Date/Time   WBC 12.4 (H) 10/21/2022 0335   RBC 2.51 (L) 10/21/2022 0335   HGB 7.6 (L) 10/21/2022 0335   HCT 23.5 (L) 10/21/2022 0335   PLT 202 10/21/2022 0335   MCV 93.6 10/21/2022 0335   MCH 30.3 10/21/2022 0335   MCHC 32.3 10/21/2022 0335   RDW 15.2 10/21/2022 0335   LYMPHSABS 1.9 12/23/2007 1452   MONOABS 0.4 12/23/2007 1452   EOSABS 0.1 12/23/2007 1452   BASOSABS 0.0 12/23/2007 1452    BMET    Component Value Date/Time   NA 133 (L) 10/21/2022 0335   NA 142 02/21/2021 1014   K 4.7 10/21/2022 0335   CL 101 10/21/2022 0335   CO2 25 10/21/2022 0335   GLUCOSE 134 (H) 10/21/2022 0335   BUN 28 (H) 10/21/2022 0335   BUN 20 02/21/2021 1014   CREATININE 1.05 10/21/2022 0335   CALCIUM 8.5 (L) 10/21/2022 0335   GFRNONAA >60 10/21/2022 0335   GFRAA 78 (L) 01/25/2013 1608    INR    Component Value Date/Time   INR 1.3 (H) 10/15/2022 2120     Intake/Output Summary (Last 24 hours) at 10/21/2022 0736 Last data filed at 10/21/2022 0700 Gross per 24 hour  Intake 284.31 ml  Output 1905 ml  Net -1620.69 ml     Assessment/Plan:  82 y.o. male is s/p 1.  Washout of right forearm fasciotomy including evacuation of hematoma 2.  Sharp excisional debridement of skin, subcutaneous tissue, muscle right forearm wound 3.  Placement  of 15 French Blake drain right forearm wound 4.  Placement of 500 mg myriad morsels right forearm wound for skin substitute 5.  VAC change of right forearm fasciotomy wound with white sponge x1 and black sponge x1 (wound measures 12 cm x 8 cm x 2 cm deep) 1 Day Post-Op    Right arm well perfused and warm with palpable radial pulse Continue to elevate arm  Continue PT/OT Hgb 7.6. Transfuse per primary team JP drain will keep for now Plan for [facility] change on Friday   Graceann Congress, [place] [facility] [phone] 10/21/2022 7:36 AM  I have seen and evaluated the patient. I agree with the PA note as documented above.  Plan return to OR tomorrow for additional washout and VAC change of right forearm fasciotomy with VAC.  I placed an additional drain yesterday and also some myriad morcells.  Making progress.  Cephus Shelling, MD [facility] [place] Office: [phone]

## 2022-10-21 NOTE — Progress Notes (Signed)
Physical Therapy Treatment Patient Details Name: Ralph Blackwell MRN: [id number] DOB: April 23, 1941 Today's Date: 10/21/2022   History of Present Illness 82 year old male that underwent right transradial cath with cardiology on 10/13/22.   Ultimately was found to have PEs and underwent thrombolysis and then developed right radial pseudoaneurysm with hematoma and compartment syndrome.  He underwent evacuation of the hematoma of his forearm with fasciotomies including carpal tunnel release and repair of the pseudoaneurysm. Underwent wash out and vac change 10/17/22. PMH: CAD, HTN, PE, OSA, renal insufficiency, GERD, Gout, Hiatal Hernia.    PT Comments    Pt agreeable to ambulation with PT today. Reports he was able to make it around the unit 3 times this morning. Pt continues to be limited in safe mobility by increased cardiovascular response to ambulation, with HR up to the 140s with about 420 feet of ambulation. HR returned to 100s with return to supine. Pt to have additional I&D later in the week. D/c plan remains appropriate. PT will continue to follow acutely.   Recommendations for follow up therapy are one component of a multi-disciplinary discharge planning process, led by the attending physician.  Recommendations may be updated based on patient status, additional functional criteria and insurance authorization.     Assistance Recommended at Discharge Frequent or constant Supervision/Assistance  Patient can return home with the following A little help with bathing/dressing/bathroom;Assistance with cooking/housework;Assist for transportation   Equipment Recommendations  None recommended by PT       Precautions / Restrictions Precautions Precaution Comments: R arm wound vac Restrictions Weight Bearing Restrictions: No     Mobility  Bed Mobility Overal bed mobility: Needs Assistance Bed Mobility: Supine to Sit     Supine to sit: Min guard Sit to supine: Min guard   General bed mobility  comments: min guard for safety, vc for scooting hips to EoB, increased time and effort    Transfers Overall transfer level: Needs assistance Equipment used:  (EVA walker) Transfers: Sit to/from Stand Sit to Stand: Min assist           General transfer comment: light min A for power up and management of UE to EVA walker,    Ambulation/Gait Ambulation/Gait assistance: Min guard Gait Distance (Feet): 450 Feet Assistive device: Fara Boros Gait Pattern/deviations: Step-through pattern, Decreased step length - right, Decreased step length - left Gait velocity: slowed Gait velocity interpretation: >2.62 ft/sec, indicative of community ambulatory   General Gait Details: min guard for safety and mangement of equiptment with slow, steady ambulation to recliner, had pt take standing restbreak when HR entered 130s, and return 15 feet to room when HR reached 140.         Balance Overall balance assessment: Modified Independent                                          Cognition Arousal/Alertness: Awake/alert Behavior During Therapy: WFL for tasks assessed/performed                                               General Comments General comments (skin integrity, edema, etc.): HR at rest 108bpm, max noted with ambulation 143bpm.      Pertinent Vitals/Pain Pain Assessment Pain Assessment: Faces Faces Pain Scale: Hurts little more  Pain Location: R forearm and hand Pain Descriptors / Indicators: Dull, Throbbing Pain Intervention(s): Limited activity within patient's tolerance, Monitored during session, Repositioned     PT Goals (current goals can now be found in the care plan section) Acute Rehab PT Goals Patient Stated Goal: get back to walking and yard work PT Goal Formulation: With patient/family Time For Goal Achievement: 10/31/22 Potential to Achieve Goals: Good Progress towards PT goals: Progressing toward goals    Frequency     Min 1X/week      PT Plan Current plan remains appropriate       AM-PAC PT "6 Clicks" Mobility   Outcome Measure  Help needed turning from your back to your side while in a flat bed without using bedrails?: None Help needed moving from lying on your back to sitting on the side of a flat bed without using bedrails?: None Help needed moving to and from a bed to a chair (including a wheelchair)?: A Little Help needed standing up from a chair using your arms (e.g., wheelchair or bedside chair)?: A Little Help needed to walk in hospital room?: A Little Help needed climbing 3-5 steps with a railing? : A Little 6 Click Score: 20    End of Session Equipment Utilized During Treatment: Gait belt;Oxygen Activity Tolerance: Treatment limited secondary to medical complications (Comment) (increased HR) Patient left: in chair;with call bell/phone within reach;with family/visitor present;with nursing/sitter in room Nurse Communication: Mobility status;Other (comment) (increase in HR with movement) PT Visit Diagnosis: Difficulty in walking, not elsewhere classified (R26.2)     Time: 0301-3143 PT Time Calculation (min) (ACUTE ONLY): 48 min  Charges:  $Therapeutic Exercise: 23-37 mins $Therapeutic Activity: 8-22 mins                     Makennah Omura B. Beverely Risen PT, DPT Acute Rehabilitation Services Please use secure chat or  Call Office [phone]    Elon Alas [facility] 10/21/2022, 4:04 PM

## 2022-10-21 NOTE — Progress Notes (Signed)
ANTICOAGULATION CONSULT NOTE  Pharmacy Consult for Heparin Indication: chest pain/ACS  No Known Allergies  Patient Measurements: Height: 5' 9.5" (176.5 cm) Weight: 92.4 kg (203 lb 11.3 oz) IBW/kg (Calculated) : 71.85 Heparin Dosing Weight: 90.8 kg  Vital Signs: Temp: 98.8 F (37.1 C) (04/09 1143) BP: 120/55 (04/09 1000) Pulse Rate: 104 (04/09 1000)  Labs: Recent Labs    10/19/22 0150 10/20/22 0151 10/21/22 0335 10/21/22 1140  HGB 7.8* 7.9* 7.6*  --   HCT 23.8* 24.4* 23.5*  --   PLT 144* 179 202  --   HEPARINUNFRC 0.38 0.31 0.21* 0.20*  CREATININE  --  1.09 1.05  --      Estimated Creatinine Clearance: 64.6 mL/min (by C-G formula based on SCr of 1.05 mg/dL).   Assessment: 74 yoM admitted with CP with clean cath. CT chest showed PE. Pt started on IV heparin. IR placed bilateral PE lysis catheters 4/2 x12h and heparin continued. Pt with compartment syndrome and taken to OR for fasciotomy 4/3, heparin resumed postop.  Heparin level subtherapeutic at 0.20 despite rate adjustment earlier today.   Goal of Therapy:  Heparin level 0.3-0.5 units/ml  Monitor platelets by anticoagulation protocol: Yes   Plan:  Increase heparin to 1200 units/h Recheck heparin level in 6h Daily heparin level and CBC  Fredonia Highland, PharmD, [place], [facility] Clinical Pharmacist [phone] Please check AMION for all [facility] Pharmacy numbers 10/21/2022

## 2022-10-21 NOTE — Progress Notes (Signed)
Brief Interventional IR Note:  IR continues to follow and monitor progress after limited PE Lysis procedure 4/2 due to the development of right forearm compartment syndrome at the site of prior cardiac cath.   He underwent washout of right forearm fasciotomy including evacuation of hematoma 10/17/22 with vascular surgery. He returned to OR 10/20/22 for washout of right forearm fasciotomy including evacuation of hematoma with sharp excisional debridement of skin, subcutaneous tissue, muscle right forearm wound.  Per chart, pt BP is stable with tachycardia. Pt O2 90% on 2L.   IR following.     Alex Gardener, AGNP-BC 10/21/2022, 9:56 AM

## 2022-10-21 NOTE — Progress Notes (Signed)
NAME:  Ralph Blackwell, MRN:  [id number], DOB:  1940/09/30, LOS: 9 ADMISSION DATE:  10/11/2022, CONSULTATION DATE:  10/13/2022 REFERRING MD:  Dr. Jacqulyn Bath, CHIEF COMPLAINT:  SOB, Chest Pain   History of Present Illness:  82 year old man who presented to [facility] ED 3/30 with progressive SOB over several weeks.  The patient reports they traveled approximately 3 weeks ago to the [place].  While on vacation he walked 2 miles per day.  He typically is normally very active.  He notes when he returned home he was taking his dog out to go to to the bathroom and he developed increasing shortness of breath and chest tightness.  He describes it as a "vice on his chest". He states he did not feel like he was good to be able to make it back into the house.  He attempted sublingual nitro and he did have some improvement in symptoms.  Noting chest pain radiated to both arms.  He was evaluated by Cardiology on presentation and was thought to have unstable angina.  In route with EMS the patient was reportedly very diaphoretic and short of breath.  He was started on nitro and heparin infusion.  He continued to have chest pain with shortness of breath with minimal activity such as brushing teeth.  He was taken for left heart cath on 4/1 with findings of no angiographically evident significant coronary artery disease.  No acute findings to suggest cause of patient's chest pain.  Prior echocardiogram performed approximately a week before admission showed an RVSP of 28 mmHg.  Repeat ECHO this admission showed RVSP of 69 mmHg.  The patient subsequently had a CT angio of the chest which was positive for saddle PE with right greater than left pulmonary emboli, noted CT evidence of right heart strain with RV LV ratio of 1.18 consistent with at least submassive PE.  Patient was evaluated by interventional radiology.  PCCM consulted for evaluation   Pertinent Medical History:  CAD HTN Pulmonary embolism-2008 after knee surgery,  subsequent fall with wrist fracture OSA Renal insufficiency GERD Gout Hiatal hernia  Significant Hospital Events: Including procedures, antibiotic start and stop dates in addition to other pertinent events   3/30 admit with SOB, chest pain 4/1 LHC with no angiographically evident coronary artery disease to suggest cause of chest pain.  Noted elevated RVSP on echo. CTA completed & positive for saddle PE. IR/PCCM consulted.  4/2 hemodynamically stable, though had an episode of CP this morning 4/3 Acute onset of RUE hand/wrist/forearm swelling, c/f compartment syndrome. VVS/Hand Surgery consulted. Plan for OR for fasciotomy. 4/4 Escalating hand pain late morning with cold fingers, loss of pleth, loss of palpable pulse/doppler only. Dressing loosened with some relief. Later in afternoon, ambulating with continued improvement in swelling. 4/5 OR with VVS for washout/WV replacement and hematoma evacuation, tolerated well. Surgicel placed in wound bed. 4/8 OR with VVS for washout/WV replacement and hematoma evacuation. WV replaced, Blake drain placed under forearm muscle belly. 4/9 POD#1 from washout, feeling well overall. Ambulating, on RA, tolerating diet. WV/Blake drain output remains sanguinous in the setting of heparin gtt.  Interim History / Subjective:  POD#1 from washout/hematoma evacuation and WV replacement No significant overnight events Reports that he slept well Ambulating, tolerating diet, keeping RUE elevated WV/Blake drain in place To OR 4/10 for washout  Objective:  Blood pressure 112/62, pulse 85, temperature 98.5 F (36.9 C), temperature source Oral, resp. rate 12, height 5' 9.5" (1.765 m), weight 92.4 kg, SpO2 95 %.  Intake/Output Summary (Last 24 hours) at 10/21/2022 0726 Last data filed at 10/21/2022 0600 Gross per 24 hour  Intake 255.81 ml  Output 1905 ml  Net -1649.19 ml    Filed Weights   10/14/22 0300 10/15/22 1417 10/18/22 0500  Weight: 92.4 kg 93 kg  92.4 kg   Physical Examination: General: Overall well-appearing elderly man in NAD. Pleasant and conversant. HEENT: [place]/AT, anicteric sclera, PERRL, moist mucous membranes. Neuro: Awake, oriented x 4. Responds to verbal stimuli. Following commands consistently. Moves all 4 extremities spontaneously. Strength 5/5 in LUE, BLE. Strength not tested in RUE due to compartment syndrome; sensation/motor function of R hand/RUE intact. CV: Irregularly irregular, rate 90-100s, no m/g/r. PULM: Breathing even and unlabored on RA. Lung fields CTAB. GI: Soft, nontender, nondistended. Normoactive bowel sounds. Extremities: No BLE or LUE edema noted. RUE ecchymotic and edematous from fingertips to elbow in the setting of compartment syndrome s/p washout. WV in place with serosanguinous output. Blake drain present (15Fr) under muscle belly to prevent hematoma reaccumulation. Skin: Warm/dry, ecchymosis of RUE as above.  Resolved Hospital Problem List:  Hyponatremia Hyperkalemia  Assessment & Plan:   Compartment syndrome of the right forearm, hand swelling and hematoma from bleeding into the forearm from previous catheter access site.  Unfortunately this is an iatrogenic complication of tPA administration for pulmonary embolism. Radial artery pseudoaneurysm, status post repair - VVS following, appreciate management - POD#1 from second R forearm washout/hematoma evacuation/WV replacement - WV/Blake drain management per VVS - Postop pain management (APAP, oxycodone, Fentanyl PRN) - Continue to elevate extremity - Hand ROM exercises as tolerated - Additional debridement/washout planned for tomorrow, 4/10   Submassive pulmonary embolism with persistent chest pain, status post catheter directed lytics - Heparin gtt - Cardiac monitoring - Outpatient repeat Echo to reevaluate RV function post-treatment - Will require minimum 3 months AC, discuss risk vs. benefit of longer-term AC  Acute blood loss anemia due  to bleeding into forearm - Trend H&H - Monitor for signs of active bleeding - Transfuse for Hgb < 7.0 or hemodynamically significant bleeding - Keep active T&S  Thrombocytopenia-unknown chronicity.  He reports that this would have been picked up by his PCP had it been more chronic.  Improving. - Trend Plt - Not requiring transfusion at present   Hyperglycemia - SSI - CBGs Q4H - Goal CBG 140-180  Constipation - Bowel regimen - Encourage mobility, OOB  History of gout - Continue allopurinol  BPH - Continue Proscar (PTA med)  History of hypertension - Continue low-dose metoprolol - Hold ARB  GERD - PPI  Remains stable, possible transfer out of ICU today pending VVS recs.  Best Practice: (right click and "Reselect all SmartList Selections" daily)  Diet/type: Regular consistency (see orders), NPO after midnight DVT prophylaxis: systemic heparin GI prophylaxis: PPI Lines: N/A Foley:  N/A Code Status:  full code Last date of multidisciplinary goals of care discussion: Full code, patient/family updated at the bedside 4/9  Critical care time: N/A   Faythe Ghee [facility] 10/21/22 7:26 AM  Please see [email or web address] for pager details.  From 7A-7P if no response, please call [phone] After hours, please call ELink [phone]

## 2022-10-22 ENCOUNTER — Inpatient Hospital Stay (HOSPITAL_COMMUNITY): Payer: PPO | Admitting: Anesthesiology

## 2022-10-22 ENCOUNTER — Other Ambulatory Visit: Payer: Self-pay

## 2022-10-22 ENCOUNTER — Encounter (HOSPITAL_COMMUNITY): Payer: Self-pay | Admitting: Internal Medicine

## 2022-10-22 ENCOUNTER — Encounter (HOSPITAL_COMMUNITY)
Admission: EM | Disposition: A | Discharge status: Home Health | Payer: Self-pay | Source: Other Acute Inpatient Hospital | Attending: Critical Care Medicine

## 2022-10-22 DIAGNOSIS — I5032 Chronic diastolic (congestive) heart failure: Secondary | ICD-10-CM | POA: Diagnosis not present

## 2022-10-22 DIAGNOSIS — S51801A Unspecified open wound of right forearm, initial encounter: Secondary | ICD-10-CM

## 2022-10-22 DIAGNOSIS — I200 Unstable angina: Secondary | ICD-10-CM | POA: Diagnosis not present

## 2022-10-22 DIAGNOSIS — M79A11 Nontraumatic compartment syndrome of right upper extremity: Secondary | ICD-10-CM | POA: Diagnosis not present

## 2022-10-22 DIAGNOSIS — I721 Aneurysm of artery of upper extremity: Secondary | ICD-10-CM | POA: Diagnosis not present

## 2022-10-22 DIAGNOSIS — D649 Anemia, unspecified: Secondary | ICD-10-CM

## 2022-10-22 DIAGNOSIS — R079 Chest pain, unspecified: Secondary | ICD-10-CM | POA: Diagnosis not present

## 2022-10-22 DIAGNOSIS — L7682 Other postprocedural complications of skin and subcutaneous tissue: Secondary | ICD-10-CM

## 2022-10-22 DIAGNOSIS — I272 Pulmonary hypertension, unspecified: Secondary | ICD-10-CM

## 2022-10-22 DIAGNOSIS — I480 Paroxysmal atrial fibrillation: Secondary | ICD-10-CM | POA: Diagnosis not present

## 2022-10-22 HISTORY — PX: I & D EXTREMITY: SHX5045

## 2022-10-22 LAB — BASIC METABOLIC PANEL
Anion gap: 8 (ref 5–15)
BUN: 31 mg/dL — ABNORMAL HIGH (ref 8–23)
CO2: 25 mmol/L (ref 22–32)
Calcium: 8.6 mg/dL — ABNORMAL LOW (ref 8.9–10.3)
Chloride: 101 mmol/L (ref 98–111)
Creatinine, Ser: 1.08 mg/dL (ref 0.61–1.24)
GFR, Estimated: >60 mL/min (ref >60)
Glucose, Bld: 126 mg/dL — ABNORMAL HIGH (ref 70–99)
Potassium: 4.4 mmol/L (ref 3.5–5.1)
Sodium: 134 mmol/L — ABNORMAL LOW (ref 135–145)

## 2022-10-22 LAB — CBC
HCT: 22.9 % — ABNORMAL LOW (ref 39.0–52.0)
Hemoglobin: 7.5 g/dL — ABNORMAL LOW (ref 13.0–17.0)
MCH: 30.4 pg (ref 26.0–34.0)
MCHC: 32.8 g/dL (ref 30.0–36.0)
MCV: 92.7 fL (ref 80.0–100.0)
Platelets: 229 10*3/uL (ref 150–400)
RBC: 2.47 MIL/uL — ABNORMAL LOW (ref 4.22–5.81)
RDW: 14.9 % (ref 11.5–15.5)
WBC: 11.1 10*3/uL — ABNORMAL HIGH (ref 4.0–10.5)
nRBC: 0.5 % — ABNORMAL HIGH (ref 0.0–0.2)

## 2022-10-22 LAB — GLUCOSE, CAPILLARY
Glucose-Capillary: 126 mg/dL — ABNORMAL HIGH (ref 70–99)
Glucose-Capillary: 150 mg/dL — ABNORMAL HIGH (ref 70–99)
Glucose-Capillary: 193 mg/dL — ABNORMAL HIGH (ref 70–99)

## 2022-10-22 LAB — HEPARIN LEVEL (UNFRACTIONATED)
Heparin Unfractionated: 0.43 IU/mL (ref 0.30–0.70)
Heparin Unfractionated: 0.35 IU/mL (ref 0.30–0.70)

## 2022-10-22 LAB — MAGNESIUM: Magnesium: 2.3 mg/dL (ref 1.7–2.4)

## 2022-10-22 SURGERY — IRRIGATION AND DEBRIDEMENT EXTREMITY
Anesthesia: General | Site: Arm Lower | Laterality: Right

## 2022-10-22 MED ORDER — PROPOFOL 10 MG/ML IV BOLUS
INTRAVENOUS | Status: DC | PRN
  Administered 2022-10-22: 100 mg via INTRAVENOUS

## 2022-10-22 MED ORDER — LIDOCAINE 2% (20 MG/ML) 5 ML SYRINGE
INTRAMUSCULAR | Status: AC
  Filled 2022-10-22: qty 5

## 2022-10-22 MED ORDER — CHLORHEXIDINE GLUCONATE 0.12 % MT SOLN
OROMUCOSAL | Status: AC
  Filled 2022-10-22: qty 15

## 2022-10-22 MED ORDER — ONDANSETRON HCL 4 MG/2ML IJ SOLN
INTRAMUSCULAR | Status: DC | PRN
  Administered 2022-10-22: 4 mg via INTRAVENOUS

## 2022-10-22 MED ORDER — FENTANYL CITRATE (PF) 100 MCG/2ML IJ SOLN
INTRAMUSCULAR | Status: AC
  Filled 2022-10-22: qty 2

## 2022-10-22 MED ORDER — CEFAZOLIN SODIUM-DEXTROSE 2-3 GM-%(50ML) IV SOLR
INTRAVENOUS | Status: DC | PRN
  Administered 2022-10-22: 2 g via INTRAVENOUS

## 2022-10-22 MED ORDER — PHENYLEPHRINE 80 MCG/ML (10ML) SYRINGE FOR IV PUSH (FOR BLOOD PRESSURE SUPPORT)
PREFILLED_SYRINGE | INTRAVENOUS | Status: AC
  Filled 2022-10-22: qty 10

## 2022-10-22 MED ORDER — PHENYLEPHRINE 80 MCG/ML (10ML) SYRINGE FOR IV PUSH (FOR BLOOD PRESSURE SUPPORT)
PREFILLED_SYRINGE | INTRAVENOUS | Status: DC | PRN
  Administered 2022-10-22: 160 ug via INTRAVENOUS

## 2022-10-22 MED ORDER — FENTANYL CITRATE (PF) 250 MCG/5ML IJ SOLN
INTRAMUSCULAR | Status: AC
  Filled 2022-10-22: qty 5

## 2022-10-22 MED ORDER — ONDANSETRON HCL 4 MG/2ML IJ SOLN: 4.0000 mg | Freq: Once | INTRAMUSCULAR | Status: DC | PRN

## 2022-10-22 MED ORDER — CEFAZOLIN SODIUM 1 G IJ SOLR
INTRAMUSCULAR | Status: AC
  Filled 2022-10-22: qty 20

## 2022-10-22 MED ORDER — FENTANYL CITRATE (PF) 100 MCG/2ML IJ SOLN
25.0000 ug | INTRAMUSCULAR | Status: DC | PRN
  Administered 2022-10-22 (×3): 50 ug via INTRAVENOUS

## 2022-10-22 MED ORDER — FENTANYL CITRATE (PF) 250 MCG/5ML IJ SOLN
INTRAMUSCULAR | Status: DC | PRN
  Administered 2022-10-22 (×5): 25 ug via INTRAVENOUS

## 2022-10-22 MED ORDER — ACETAMINOPHEN 500 MG PO TABS
1000.0000 mg | ORAL_TABLET | Freq: Once | ORAL | Status: AC
  Administered 2022-10-22: 1000 mg via ORAL
  Filled 2022-10-22: qty 2

## 2022-10-22 MED ORDER — 0.9 % SODIUM CHLORIDE (POUR BTL) OPTIME
TOPICAL | Status: DC | PRN
  Administered 2022-10-22: 1000 mL

## 2022-10-22 MED ORDER — LACTATED RINGERS IV SOLN: INTRAVENOUS | Status: DC | PRN

## 2022-10-22 MED ORDER — LIDOCAINE 2% (20 MG/ML) 5 ML SYRINGE
INTRAMUSCULAR | Status: DC | PRN
  Administered 2022-10-22: 60 mg via INTRAVENOUS

## 2022-10-22 MED ORDER — ONDANSETRON HCL 4 MG/2ML IJ SOLN
INTRAMUSCULAR | Status: AC
  Filled 2022-10-22: qty 2

## 2022-10-22 SURGICAL SUPPLY — 42 items
ADH SKN CLS APL DERMABOND .7 (GAUZE/BANDAGES/DRESSINGS) ×1
BAG COUNTER SPONGE SURGICOUNT (BAG) ×1 IMPLANT
BAG SPNG CNTER NS LX DISP (BAG) ×1
BNDG ELASTIC 4X5.8 VLCR STR LF (GAUZE/BANDAGES/DRESSINGS) IMPLANT
BNDG ELASTIC 6X5.8 VLCR STR LF (GAUZE/BANDAGES/DRESSINGS) IMPLANT
BNDG GAUZE DERMACEA FLUFF 4 (GAUZE/BANDAGES/DRESSINGS) IMPLANT
BNDG GZE DERMACEA 4 6PLY (GAUZE/BANDAGES/DRESSINGS)
CANISTER SUCT 3000ML PPV (MISCELLANEOUS) ×1 IMPLANT
CANISTER WOUNDNEG PRESSURE 500 (CANNISTER) IMPLANT
COVER SURGICAL LIGHT HANDLE (MISCELLANEOUS) ×2 IMPLANT
DERMABOND ADVANCED .7 DNX12 (GAUZE/BANDAGES/DRESSINGS) ×1 IMPLANT
DRAPE INCISE IOBAN 66X45 STRL (DRAPES) IMPLANT
DRAPE ORTHO SPLIT 77X108 STRL (DRAPES)
DRAPE SURG ORHT 6 SPLT 77X108 (DRAPES) IMPLANT
DRESSING MORCELLS FINE 1000 (Tissue) IMPLANT
DRSG CUTIMED SORBACT 7X9 (GAUZE/BANDAGES/DRESSINGS) IMPLANT
DRSG VAC GRANUFOAM MED (GAUZE/BANDAGES/DRESSINGS) IMPLANT
DRSG VERSA FOAM LRG 10X15 (GAUZE/BANDAGES/DRESSINGS) IMPLANT
ELECT REM PT RETURN 9FT ADLT (ELECTROSURGICAL) ×1
ELECTRODE REM PT RTRN 9FT ADLT (ELECTROSURGICAL) ×1 IMPLANT
GAUZE SPONGE 4X4 12PLY STRL (GAUZE/BANDAGES/DRESSINGS) ×1 IMPLANT
GLOVE BIO SURGEON STRL SZ7.5 (GLOVE) ×1 IMPLANT
GLOVE BIOGEL PI IND STRL 8 (GLOVE) ×1 IMPLANT
GOWN STRL REUS W/ TWL LRG LVL3 (GOWN DISPOSABLE) ×2 IMPLANT
GOWN STRL REUS W/ TWL XL LVL3 (GOWN DISPOSABLE) ×2 IMPLANT
GOWN STRL REUS W/TWL LRG LVL3 (GOWN DISPOSABLE) ×2
GOWN STRL REUS W/TWL XL LVL3 (GOWN DISPOSABLE) ×2
GRAFT MYRIAD 2 LAYER 10X20 (Graft) IMPLANT
KIT BASIN OR (CUSTOM PROCEDURE TRAY) ×1 IMPLANT
KIT TURNOVER KIT B (KITS) ×1 IMPLANT
NS IRRIG 1000ML POUR BTL (IV SOLUTION) ×1 IMPLANT
PACK CV ACCESS (CUSTOM PROCEDURE TRAY) ×1 IMPLANT
PACK GENERAL/GYN (CUSTOM PROCEDURE TRAY) IMPLANT
PAD ARMBOARD 7.5X6 YLW CONV (MISCELLANEOUS) ×2 IMPLANT
SUT ETHILON 3 0 PS 1 (SUTURE) IMPLANT
SUT MNCRL AB 4-0 PS2 18 (SUTURE) IMPLANT
SUT VIC AB 2-0 CT1 27 (SUTURE)
SUT VIC AB 2-0 CT1 TAPERPNT 27 (SUTURE) IMPLANT
SUT VIC AB 3-0 SH 27 (SUTURE)
SUT VIC AB 3-0 SH 27X BRD (SUTURE) IMPLANT
TOWEL GREEN STERILE (TOWEL DISPOSABLE) ×1 IMPLANT
WATER STERILE IRR 1000ML POUR (IV SOLUTION) ×1 IMPLANT

## 2022-10-22 NOTE — Anesthesia Procedure Notes (Signed)
Procedure Name: LMA Insertion Date/Time: 10/22/2022 1:51 PM  Performed by: Ayesha Rumpf, CRNAPre-anesthesia Checklist: Patient identified, Emergency Drugs available, Suction available and Patient being monitored Patient Re-evaluated:Patient Re-evaluated prior to induction Oxygen Delivery Method: Circle System Utilized Preoxygenation: Pre-oxygenation with 100% oxygen Induction Type: IV induction Ventilation: Mask ventilation without difficulty LMA: LMA inserted LMA Size: 4.0 Number of attempts: 1 Airway Equipment and Method: Bite block Placement Confirmation: positive ETCO2 Tube secured with: Tape Dental Injury: Teeth and Oropharynx as per pre-operative assessment

## 2022-10-22 NOTE — Progress Notes (Signed)
Patient resting comfortably in bedside chair.  The swelling of his hand has greatly improved since I last saw him.  He still has significant ecchymosis of his entire forearm and hand.  The wound vac is intact w/ good seal.  There is a hemovac drain with sanguinous output.  He has much improved AROM of his fingers as his swelling has improved.  He still lacks ~3 cm from the [facility] with finger flexion.  Finger adduction and abduction is intact.  Thumb opposition limited by swelling but almost to tip of small finger.  Sensation is intact to light touch in the median, ulnar, and radial nerve distributions and symmetric to the unaffected, contralateral side.  The hand is warm and well perfused w/ BCR.   He was working with OT during my time with him.  He has been given exercises to do to maintain and improve his finger ROM.  Plan for repeat I&D today with possible closure of more of the wound, if possible, with application of new wound vac.  Again discussed with patient and his family that he has a long way to go in terms of definitive wound coverage and hand function.  Fortunately, he is making good progress and is motivated to improve.   Marlyne Beards, M.D. [facility]

## 2022-10-22 NOTE — Progress Notes (Signed)
Occupational Therapy Treatment Patient Details Name: Ralph Blackwell MRN: [id number] DOB: 03/27/1941 Today's Date: 10/22/2022   History of present illness 82 year old male that underwent right transradial cath with cardiology on 10/13/22.   Ultimately was found to have PEs and underwent thrombolysis and then developed right radial pseudoaneurysm with hematoma and compartment syndrome.  He underwent evacuation of the hematoma of his forearm with fasciotomies including carpal tunnel release and repair of the pseudoaneurysm. Underwent wash out and vac change 10/17/22. PMH: CAD, HTN, PE, OSA, renal insufficiency, GERD, Gout, Hiatal Hernia.   OT comments  Pt continuing to progress towards patient focused goals. Pt RUE visually appears better and less swollen than last OT session. Pt continuing to remain at min guard to supervision level for functional mobility but remains limited by increased HR. HR up to max of 119 with functional ambulation, which is much better than previous OT session with HR being noted in the 170s. Added hand exercises (below) to exercise program to decrease hand swelling and progress AROM. DC plans and frequency remain appropriate at this time.   Recommendations for follow up therapy are one component of a multi-disciplinary discharge planning process, led by the attending physician.  Recommendations may be updated based on patient status, additional functional criteria and insurance authorization.    Assistance Recommended at Discharge Intermittent Supervision/Assistance  Patient can return home with the following  A little help with bathing/dressing/bathroom;Assistance with cooking/housework;Assist for transportation   Equipment Recommendations  Tub/shower seat    Recommendations for Other Services      Precautions / Restrictions Precautions Precaution Comments: R arm wound vac Restrictions Weight Bearing Restrictions: No Other Position/Activity Restrictions: Keep RUE  elevated when resting       Mobility Bed Mobility               General bed mobility comments: Pt rec'd and left sitting in recliner at end of OT session    Transfers Overall transfer level: Needs assistance Equipment used:  Therapist, sports) Transfers: Sit to/from Stand Sit to Stand: Min guard           General transfer comment: Pt uses rails of locked EVA walker to assist with powerup to standing     Balance                                           ADL either performed or assessed with clinical judgement   ADL Overall ADL's : Needs assistance/impaired                 Upper Body Dressing : Sitting;Maximal assistance Upper Body Dressing Details (indicate cue type and reason): Donned gown in jacket like fashion                 Functional mobility during ADLs: Supervision/safety (+ EVA walker for RUE support) General ADL Comments: Close supervision functional ambulation    Extremity/Trunk Assessment              Vision       Perception     Praxis      Cognition Arousal/Alertness: Awake/alert Behavior During Therapy: WFL for tasks assessed/performed Overall Cognitive Status: Within Functional Limits for tasks assessed  Exercises Other Exercises Other Exercises: Thumb plamar glides RUE Other Exercises: Thumb opposition to tolerance    Shoulder Instructions       General Comments HR in the high 90s at rest, max noted with ambulation 119. Pt reported dizziness upon first STS Bp 109/64, dizziness faded following second STS    Pertinent Vitals/ Pain       Pain Assessment Pain Assessment: No/denies pain  Home Living                                          Prior Functioning/Environment              Frequency  Min 3X/week        Progress Toward Goals  OT Goals(current goals can now be found in the care plan section)  Progress  towards OT goals: Progressing toward goals  Acute Rehab OT Goals Patient Stated Goal: To get better; hand to get better OT Goal Formulation: With patient Time For Goal Achievement: 10/31/22 Potential to Achieve Goals: Good  Plan Frequency remains appropriate;Discharge plan remains appropriate    Co-evaluation                 AM-PAC OT "6 Clicks" Daily Activity     Outcome Measure   Help from another person eating meals?: A Little Help from another person taking care of personal grooming?: A Little Help from another person toileting, which includes using toliet, bedpan, or urinal?: A Little Help from another person bathing (including washing, rinsing, drying)?: A Lot Help from another person to put on and taking off regular upper body clothing?: A Lot Help from another person to put on and taking off regular lower body clothing?: A Lot 6 Click Score: 15    End of Session Equipment Utilized During Treatment: Gait belt;Other (comment) (EVA walker)  OT Visit Diagnosis: Muscle weakness (generalized) (M62.81);Pain   Activity Tolerance Patient tolerated treatment well   Patient Left in chair;with call bell/phone within reach;with family/visitor present   Nurse Communication Mobility status;Other (comment) (Watch HR)        Time: 1045-1110 OT Time Calculation (min): 25 min  Charges: OT General Charges $OT Visit: 1 Visit OT Treatments $Therapeutic Activity: 23-37 mins  10/22/2022  AB, OTR/L  Acute Rehabilitation Services  Office: [phone]   Tristan Schroeder 10/22/2022, 11:23 AM

## 2022-10-22 NOTE — Anesthesia Preprocedure Evaluation (Addendum)
Anesthesia Evaluation  Patient identified by MRN, date of birth, ID band Patient awake    Reviewed: Allergy & Precautions, NPO status , Patient's Chart, lab work & pertinent test results  Airway Mallampati: III  TM Distance: >3 FB Neck ROM: Full    Dental no notable dental hx. (+) Teeth Intact, Dental Advisory Given   Pulmonary sleep apnea and Continuous Positive Airway Pressure Ventilation , PE (2008 provoked (postop))   Pulmonary exam normal breath sounds clear to auscultation       Cardiovascular hypertension, Pt. on medications pulmonary hypertension (severe pHTN)+CHF (grade 1 diastolic dysfunction)  Normal cardiovascular exam+ Valvular Problems/Murmurs (mild AI) AI  Rhythm:Regular Rate:Normal  09/2022 echo  1. Left ventricular ejection fraction, by estimation, is 65 to 70%. The  left ventricle has normal function. The left ventricle has no regional  wall motion abnormalities. Left ventricular diastolic parameters are  consistent with Grade I diastolic  dysfunction (impaired relaxation).   2. Right ventricular systolic function is normal. The right ventricular  size is normal. There is severely elevated pulmonary artery systolic  pressure. The estimated right ventricular systolic pressure is 69.2 mmHg.   3. The mitral valve is grossly normal. Mild mitral valve regurgitation.  No evidence of mitral stenosis.   4. The tricuspid valve is abnormal. Tricuspid valve regurgitation is  moderate.   5. The aortic valve is tricuspid. There is mild calcification of the  aortic valve. Aortic valve regurgitation is mild. No aortic stenosis is  present.   6. There is Moderate (Grade III) plaque involving the descending aorta.   7. The inferior vena cava is normal in size with <50% respiratory  variability, suggesting right atrial pressure of 8 mmHg.     Neuro/Psych negative neurological ROS  negative psych ROS   GI/Hepatic Neg liver  ROS, hiatal hernia,GERD  Controlled,,  Endo/Other  negative endocrine ROS    Renal/GU Renal InsufficiencyRenal diseaseCr 1.05  negative genitourinary   Musculoskeletal negative musculoskeletal ROS (+)    Abdominal   Peds  Hematology  (+) Blood dyscrasia, anemia Hb 7.5, plt 229   Anesthesia Other Findings   Reproductive/Obstetrics negative OB ROS                             Anesthesia Physical Anesthesia Plan  ASA: 4  Anesthesia Plan: General   Post-op Pain Management: Tylenol PO (pre-op)*   Induction: Intravenous  PONV Risk Score and Plan: 2 and Ondansetron, Dexamethasone and Treatment may vary due to age or medical condition  Airway Management Planned: LMA  Additional Equipment: None  Intra-op Plan:   Post-operative Plan: Extubation in OR  Informed Consent: I have reviewed the patients History and Physical, chart, labs and discussed the procedure including the risks, benefits and alternatives for the proposed anesthesia with the patient or authorized representative who has indicated his/her understanding and acceptance.     Dental advisory given  Plan Discussed with: CRNA  Anesthesia Plan Comments: (LMA 5 last washout)       Anesthesia Quick Evaluation

## 2022-10-22 NOTE — Progress Notes (Signed)
ANTICOAGULATION CONSULT NOTE - Follow Up Consult  Pharmacy Consult for Heparin Indication: pulmonary embolus  No Known Allergies  Patient Measurements: Height: 5' 9.5" (176.5 cm) Weight: 92.4 kg (203 lb 11.3 oz) IBW/kg (Calculated) : 71.85 Heparin Dosing Weight: 90 kg  Vital Signs: Temp: 98.6 F (37 C) (04/10 0815) Temp Source: Oral (04/10 0815) BP: 127/77 (04/10 0815) Pulse Rate: 89 (04/10 0815)  Labs: Recent Labs    10/20/22 0151 10/21/22 0335 10/21/22 1140 10/21/22 2232 10/22/22 0718  HGB 7.9* 7.6*  --   --  7.5*  HCT 24.4* 23.5*  --   --  22.9*  PLT 179 202  --   --  229  HEPARINUNFRC 0.31 0.21* 0.20* 0.29* 0.43  CREATININE 1.09 1.05  --   --  1.08    Estimated Creatinine Clearance: 62.8 mL/min (by C-G formula based on SCr of 1.08 mg/dL).  Assessment: 89 yoM admitted with CP with clean cath. CT chest showed PE. Pt started on IV heparin. IR placed bilateral PE lysis catheters 4/2 x12h and heparin continued. Pt with compartment syndrome and taken to OR for fasciotomy and evacuation of hematoma on 4/3, then washout on 4/5 and 4/8. Planning further I&D and vac change today.   Heparin level is therapeutic (0.43) on 1300 units/hr. Hgb 7.5, low stable.  Platelet count improved to 229.  Goal of Therapy:  Heparin level 0.3-0.5 units/ml Monitor platelets by anticoagulation protocol: Yes   Plan:  Continue heparin drip at 1300 units/hr Daily heparin level and CBC. For OR today, will f/u for timing of resuming heparin post-op  Dennie Fetters, RPh 10/22/2022,9:35 AM

## 2022-10-22 NOTE — Op Note (Signed)
Date: October 22, 2022   Preoperative diagnosis:   1. Right forearm compartment syndrome from radial artery pseudoaneurysm after transradial catheterization requiring fasciotomy 2. Open right forearm wound   Postoperative diagnosis: Same   Procedure: 1.  Washout of right forearm fasciotomy 2.  Sharp excisional debridement of muscle right forearm wound 3.  Placement of 1000 mg myriad morcells and 10 x 20 cm myriad matrix right forearm wound for application of skin substitute 4.  VAC change of right forearm fasciotomy wound with white sponge x1 and black sponge x1 (wound measures 13 cm x 8 cm x 1 cm deep)   Surgeon: Dr. Cephus Shelling, MD   Co-surgeon: Dr. Marlyne Beards, MD   Indications: 82 year old male that underwent right transradial cath with cardiology on 10/13/22.  Ultimately was found to have PEs and underwent thrombolysis and then developed right radial pseudoaneurysm with hematoma and compartment syndrome.  He underwent evacuation of the hematoma of his forearm with fasciotomies including carpal tunnel release and repair of the pseudoaneurysm last week.  He has a very large right forearm wound from the fasciotomy with significant swelling and edema.  He presents for VAC change and washout.   Findings: All of the muscle in the forearm compartment appeared viable and was contractile with Bovie.  We did debride some of the muscle to get a healthy bed of tissue.  The wound was copiously irrigated.  I put 1000 mg of myriad morcells and a 10 x 20 cm myriad matrix and then this was covered with a white and black VAC sponge.  We left the previous drain in place in the deeper compartment where there was a small hematoma.  Anesthesia: General   Details: Patient was taken to the operating room after informed consent was obtained.  Placed on operative table supine position.  General endotracheal anesthesia was induced.  The previous VAC dressing to the right forearm was removed and the right  arm was prepped and draped in standard sterile fashion.  Antibiotics were given and timeout performed.  We explored all the muscle in the right forearm and all this appeared viable.  All the compartments were copiously irrigated.  We did reposition the drain that was left in place as there was a very small hematoma under the deeper compartments.  We debrided some of the muscle in the forearm that was in the superficial compartment with a rongeur and Metzenbaum scissors.  I then placed 1000 mg myriad morcells in the wound bed as well as a 10 x 20 cm myriad matrix was cut to the size of the wound.  I then tacked this down with a Sorbact and 4-0 Monocryl.  We applied gel and we cut a white and black VAC sponge and Ioban and a track pad with good seal.  Complication: None   Condition: Stable  Cephus Shelling, MD [facility] Office: [phone]   Cephus Shelling

## 2022-10-22 NOTE — Consult Note (Addendum)
Cardiology Consultation   Patient ID: Ralph Blackwell MRN: [id number]; DOB: [birth year]  Admit date: 10/11/2022 Date of Consult: 10/22/2022  PCP:  Joya Martyr, MD (Inactive)   [facility] Providers Cardiologist:  Christell Constant, MD        Patient Profile:   Ralph Blackwell is a 82 y.o. male with a hx of HTN, HLD, CAD non obstructive in 2018 without prior intervention was admitted 10/11/22 with chest pain and DOE, NTG paste with improvement and IV heparin who is being seen 10/22/2022 for the evaluation of continued cardiac care and atrial fib at the request of Dr. Jacqulyn Bath.  History of Present Illness:   [name] with above hx and prior cardiac cath in 2018 with 30% stenosis in LAD and otherwise patent coronary arteries.  Additionally GERD, Gout, HTN, PE agter knee surgery in 2008, sleep apnea and FH of CAD.      Pt was admitted 10/10/32 with chest pain and DOE  chest pain began while walking and radiated to bilateral shoulder blades.  Sl NTG at home helped with pain along with ASA 324 mg.  Wife reported significant diaphoresis. He was placed on NTG drip and IV heparin.  Plans for cath were made.  Plts on admit 89k, Hgb 14 WBC 7.4 Cr 1.21 K+ 4.4   Hs troponin 22 and 24.  EKG SR with 1st degree AV block and LAFB.     On 10/13/22 underwent cardiac cath 10/13/22 and had minimal luminal irregularities in LAD, RCA was small and non dominant.   On Echo from this admit compared to prior Echo the week before his RVSP had increased from 28 to 69.  With pt's hx of PE in past, CTA of chest was ordered and revealed + PE Rt >Lt with saddle PE. + CT evidence of RH strain. Consistent with at least submassive PE.   (Also noted significant enlargement of Left thyroid lobe that extends substernally.   (Pt with recent travel to [place])      Venous doppler with DVT on Rt popliteal vein and Rt gastrocnemius veins neg on Lt.  IV heparin was continued.  CCM was consulted and pt transferred to  ICU   10/14/22 pt underwent emergent thrombectomy in IR.  He then rec'd TPA for 12 hours.  Pt developed Rt hand and forearm swelling with grade IV hematoma on 10/15/22.  Severe restriction of his Rt hand finger movement.  Plts were down to 50 k as well.   Dr. Rosemary Holms asked vascular to see for impending compartment syndrome.  He was off tPA and IV heparin was stopped.  Ortho was consulted as well.   Pt's exam with increasing lack of motion with fingers - he underwent urgent surgery to decompress his hand for compression syndrome. Rt forearm fasciotomy with carpal tunnel releases, rt radial artery thrombectomy, rt radial artery pseudoaneurysm repair.   On 4/4 had increased pain of hand it improved with loosening of dressing -  pt was able to walk around in unit that evening.  4/5 back to OR for VAC change, and washout of Rt forearm fasciotomy including evacuation of hematoma.   He continued on IV heparin and was stable. On 10/20/22 planned return to OR for washout of Rt forearm fasciotomy including evac of hematoma.  Change of VAC.    On 10/21/22 pt had brief atrial fib.  He was on IV heparin and on lopressor 12.5 mg BID (started on 10/20/22) he is to go  back to OR today for washout and Vac change. .    Labs today Na 134 K+ 4.4 BUN 31 Cr 1.08 Mg+ 2.3  WBC 11.1 Hgb 7.5 plts now 229  Pk troponin 139 after PE diagnosed.  BP 127/77 P 87 afebrile and Rest 17  at 0900 HR to 129   Past Medical History:  Diagnosis Date   Coronary artery disease    GERD (gastroesophageal reflux disease)    Gout    History of hiatal hernia    Hypertension    Pulmonary embolism 2008 after knee surgey   Renal insufficiency    hx renal failure 2008 after blood clots   Sleep apnea     Past Surgical History:  Procedure Laterality Date   APPLICATION OF WOUND VAC Right 10/20/2022   Procedure: WOUND VAC EXCHANGE;  Surgeon: Cephus Shelling, MD;  Location: [facility] OR;  Service: Vascular;  Laterality: Right;   arthroscopic knee  surgery  2004   BACK SURGERY  nov 1979 and 1998   lower   COLONOSCOPY WITH PROPOFOL N/A 10/20/2016   Procedure: COLONOSCOPY WITH PROPOFOL;  Surgeon: Charolett Bumpers, MD;  Location: [facility] ENDOSCOPY;  Service: Endoscopy;  Laterality: N/A;   FASCIOTOMY Right 10/15/2022   Procedure: RIGHT FOREARM FASCIOTOMY, RADIAL ARTERY REPAIR, AND RADIAL ARTERY THROMBECOTOMY;  Surgeon: Marlyne Beards, MD;  Location: [facility] OR;  Service: Orthopedics;  Laterality: Right;   I & D EXTREMITY Right 10/17/2022   Procedure: IRRIGATION AND DEBRIDEMENT RIGHT ARM & WOUND VAC CHANGE;  Surgeon: Cephus Shelling, MD;  Location: [facility] OR;  Service: Vascular;  Laterality: Right;   INCISION AND DRAINAGE OF WOUND Right 10/20/2022   Procedure: IRRIGATION AND DEBRIDEMENT SKIN, SUBCUTANEOUS TISSUE AND MUSCLE WITH MYRIAD MORSELS AND DRAIN PLACEMENT;  Surgeon: Cephus Shelling, MD;  Location: [facility] OR;  Service: Vascular;  Laterality: Right;   IR ANGIOGRAM PULMONARY BILATERAL SELECTIVE  10/14/2022   IR ANGIOGRAM SELECTIVE EACH ADDITIONAL VESSEL  10/14/2022   IR ANGIOGRAM SELECTIVE EACH ADDITIONAL VESSEL  10/14/2022   IR INFUSION THROMBOL ARTERIAL INITIAL (MS)  10/14/2022   IR INFUSION THROMBOL ARTERIAL INITIAL (MS)  10/14/2022   IR THROMB F/U EVAL ART/VEN FINAL DAY (MS)  10/15/2022   IR US GUIDE VASC ACCESS RIGHT  10/14/2022   JOINT REPLACEMENT Left 2007   knee    LEFT HEART CATH AND CORONARY ANGIOGRAPHY N/A 06/30/2017   Procedure: LEFT HEART CATH AND CORONARY ANGIOGRAPHY;  Surgeon: Elder Negus, MD;  Location: [facility] IN[facility]AS[facility] LAB;  Service: Cardiovascular;  Laterality: N/A;   LEFT HEART CATH AND CORONARY ANGIOGRAPHY N/A 10/13/2022   Procedure: LEFT HEART CATH AND CORONARY ANGIOGRAPHY;  Surgeon: Elder Negus, MD;  Location: [facility] INVAS[facility] CV LAB;  Service: Cardiovascular;  Laterality: N/A;   left wrist plating   2005   titanium plate   right total knee replacement Right 2009     Home Medications:  Prior to Admission medications    Medication Sig Start Date End Date Taking? Authorizing Provider  allopurinol (ZYLOPRIM) 300 MG tablet Take 150 mg by mouth daily.    Yes [provider]  ascorbic acid (VITAMIN C) 500 MG tablet Take 500 mg by mouth daily.   Yes [provider]  Cholecalciferol (VITAMIN D3) 5000 units CAPS Take 2,000 Units by mouth daily with supper.    Yes [provider]  finasteride (PROSCAR) 5 MG tablet Take 5 mg by mouth at bedtime.    Yes [provider]  fluticasone Aleda Grana)  50 MCG/ACT nasal spray Place 1 spray into both nostrils every evening.   Yes [provider]  magnesium gluconate (MAGONATE) 500 MG tablet Take 500 mg by mouth daily.   Yes [provider]  nitroGLYCERIN (NITROSTAT) 0.4 MG SL tablet Place 0.4 mg under the tongue every 5 (five) minutes as needed for chest pain.   Yes [provider]  Omega-3 Fatty Acids (FISH OIL) 1000 MG CAPS Take 1,000 mg by mouth daily.   Yes [provider]  omeprazole (PRILOSEC) 40 MG capsule Take 1 capsule (40 mg total) by mouth daily. 04/21/22  Yes Patwardhan, Manish J, MD  rosuvastatin (CRESTOR) 20 MG tablet Take 1 tablet (20 mg total) by mouth daily. 06/30/22 06/25/23 Yes Patwardhan, Manish J, MD  telmisartan (MICARDIS) 80 MG tablet Take 40 mg by mouth daily.   Yes [provider]  zinc gluconate 50 MG tablet Take 50 mg by mouth daily.   Yes [provider]    Inpatient Medications: Scheduled Meds:  [MAR Hold] allopurinol  150 mg Oral Daily   [MAR Hold] aspirin EC  81 mg Oral Daily   chlorhexidine       chlorhexidine       [MAR Hold] Chlorhexidine Gluconate Cloth  6 each Topical Daily   [MAR Hold] finasteride  5 mg Oral QHS   [MAR Hold] insulin aspart  1-3 Units Subcutaneous TID PC & HS   [MAR Hold] metoprolol tartrate  12.5 mg Oral BID   [MAR Hold] pantoprazole  80 mg Oral Daily   [MAR Hold] rosuvastatin  40 mg Oral Daily   [MAR Hold] sodium chloride flush  3 mL  Intravenous Q12H   [MAR Hold] sodium chloride flush  3 mL Intravenous Q12H   Continuous Infusions:  [MAR Hold] sodium chloride     [MAR Hold] sodium chloride     sodium chloride Stopped (10/15/22 1235)   sodium chloride Stopped (10/15/22 1235)   heparin 1,300 Units/hr (10/22/22 0514)   PRN Meds: [BOF Hold] sodium chloride, [MAR Hold] sodium chloride, [MAR Hold] acetaminophen, chlorhexidine, chlorhexidine, [MAR Hold] fentaNYL (SUBLIMAZE) injection, [MAR Hold] ondansetron (ZOFRAN) IV, [MAR Hold] mouth rinse, [MAR Hold] oxyCODONE, [MAR Hold] polyethylene glycol, [MAR Hold] senna, [MAR Hold] sodium chloride flush, [MAR Hold] sodium chloride flush  Allergies:   No Known Allergies  Social History:   Social History   Socioeconomic History   Marital status: Married    Spouse name: Not on file   Number of children: 2   Years of education: Not on file   Highest education level: Not on file  Occupational History   Not on file  Tobacco Use   Smoking status: Never   Smokeless tobacco: Never  Vaping Use   Vaping Use: Never used  Substance and Sexual Activity   Alcohol use: No   Drug use: No   Sexual activity: Not on file  Other Topics Concern   Not on file  Social History Narrative   Not on file   Social Determinants of Health   Financial Resource Strain: Not on file  Food Insecurity: No Food Insecurity (10/13/2022)   Hunger Vital Sign    Worried About Running Out of Food in the Last Year: Never true    Ran Out of Food in the Last Year: Never true  Transportation Needs: No Transportation Needs (10/13/2022)   PRAPARE - Administrator, Civil Service (Medical): No    Lack of Transportation (Non-Medical): No  Physical Activity: Not on  file  Stress: Not on file  Social Connections: Not on file  Intimate Partner Violence: Not At Risk (10/13/2022)   Humiliation, Afraid, Rape, and Kick questionnaire    Fear of Current or Ex-Partner: No    Emotionally Abused: No    Physically  Abused: No    Sexually Abused: No    Family History:    Family History  Problem Relation Age of Onset   Heart disease Mother    Hypertension Mother    Heart disease Father    Heart attack Father    Hypertension Father      ROS:  Please see the history of present illness.  General:no colds or fevers, no weight changes Skin:no rashes or ulcers HEENT:no blurred vision, no congestion CV:see HPI PUL:see HPI GI:no diarrhea constipation or melena, no indigestion GU:no hematuria, no dysuria MS:no joint pain, no claudication-  Rt arm pain  Neuro:no syncope, no lightheadedness Endo:no diabetes, no thyroid disease  All other ROS reviewed and negative.     Physical Exam/Data:   Vitals:   10/21/22 1930 10/21/22 2316 10/22/22 0513 10/22/22 0815  BP: 137/60 (!) 116/55 110/63 127/77  Pulse: 93 99 84 89  Resp: 17 14 18 15   Temp: 98.4 F (36.9 C) 99.1 F (37.3 C) 98.3 F (36.8 C) 98.6 F (37 C)  TempSrc: Oral Oral Oral Oral  SpO2: 98% 97% 100% 95%  Weight:      Height:        Intake/Output Summary (Last 24 hours) at 10/22/2022 1151 Last data filed at 10/22/2022 0514 Gross per 24 hour  Intake 326.23 ml  Output 1410 ml  Net -1083.77 ml      10/18/2022    5:00 AM 10/15/2022    2:17 PM 10/14/2022    3:00 AM  Last 3 Weights  Weight (lbs) 203 lb 11.3 oz 205 lb 0.4 oz 203 lb 11.3 oz  Weight (kg) 92.4 kg 93 kg 92.4 kg     Body mass index is 29.65 kg/m.   Pt in OR Dr. Izora Ribas to evaluate.   EKG:  The EKG was personally reviewed and demonstrates:  10/19/22  SR with PACs and non specific T wave abnormality  Telemetry:  Telemetry was personally reviewed and demonstrates:  SR with PACs and PAF   Relevant CV Studies: Echo 10/12/22 IMPRESSIONS     1. Left ventricular ejection fraction, by estimation, is 65 to 70%. The  left ventricle has normal function. The left ventricle has no regional  wall motion abnormalities. Left ventricular diastolic parameters are  consistent with  Grade I diastolic  dysfunction (impaired relaxation).   2. Right ventricular systolic function is normal. The right ventricular  size is normal. There is severely elevated pulmonary artery systolic  pressure. The estimated right ventricular systolic pressure is 69.2 mmHg.   3. The mitral valve is grossly normal. Mild mitral valve regurgitation.  No evidence of mitral stenosis.   4. The tricuspid valve is abnormal. Tricuspid valve regurgitation is  moderate.   5. The aortic valve is tricuspid. There is mild calcification of the  aortic valve. Aortic valve regurgitation is mild. No aortic stenosis is  present.   6. There is Moderate (Grade III) plaque involving the descending aorta.   7. The inferior vena cava is normal in size with <50% respiratory  variability, suggesting right atrial pressure of 8 mmHg.   Comparison(s): No prior Echocardiogram.   FINDINGS   Left Ventricle: Left ventricular ejection fraction, by estimation, is 65  to 70%. The left ventricle has normal function. The left ventricle has no  regional wall motion abnormalities. The left ventricular internal cavity  size was normal in size. There is   no left ventricular hypertrophy. Left ventricular diastolic parameters  are consistent with Grade I diastolic dysfunction (impaired relaxation).   Right Ventricle: The right ventricular size is normal. No increase in  right ventricular wall thickness. Right ventricular systolic function is  normal. There is severely elevated pulmonary artery systolic pressure. The  tricuspid regurgitant velocity is  3.91 m/s, and with an assumed right atrial pressure of 8 mmHg, the  estimated right ventricular systolic pressure is 69.2 mmHg.   Left Atrium: Left atrial size was normal in size.   Right Atrium: Right atrial size was normal in size.   Pericardium: There is no evidence of pericardial effusion.   Mitral Valve: The mitral valve is grossly normal. Mild mitral valve   regurgitation. No evidence of mitral valve stenosis.   Tricuspid Valve: The tricuspid valve is abnormal. Tricuspid valve  regurgitation is moderate . No evidence of tricuspid stenosis.   Aortic Valve: The aortic valve is tricuspid. There is mild calcification  of the aortic valve. Aortic valve regurgitation is mild. No aortic  stenosis is present. Aortic valve mean gradient measures 3.0 mmHg. Aortic  valve peak gradient measures 5.9 mmHg.  Aortic valve area, by VTI measures 2.70 cm.   Pulmonic Valve: The pulmonic valve was not well visualized. Pulmonic valve  regurgitation is mild. No evidence of pulmonic stenosis.   Aorta: The aortic root and ascending aorta are structurally normal, with  no evidence of dilitation. There is moderate (Grade III) plaque involving  the descending aorta.   Venous: The inferior vena cava is normal in size with less than 50%  respiratory variability, suggesting right atrial pressure of 8 mmHg.   IAS/Shunts: No atrial level shunt detected by color flow Doppler.    Echocardiogram 10/08/2022:  Normal LV systolic function with visual EF 60-65%. Left ventricle cavity  is normal in size. Normal left ventricular wall thickness. Normal global  wall motion. Normal diastolic filling pattern, normal LAP. Calculated EF  70%.  Left atrial cavity is slightly dilated.  Trileaflet aortic valve.  Moderate (Grade III) aortic regurgitation.  Structurally normal mitral valve.  Mild (Grade I) mitral regurgitation.  Structurally normal tricuspid valve.  Mild tricuspid regurgitation.  Structurally normal pulmonic valve.  Mild pulmonic regurgitation.  No significant change compared to 03/2022.   Cardiac cath 10/13/22 LM: Normal LAD: Minimal luminal irregularities Lcx: Large, dominant. Normal RCA: Small, nondominant.  Normal   LVEDP normal   Resting Pd/Pa: 0.93 FFR: 0.88 CFR: 4.5 IMR: 27 (only marginally abnormal)   No angiographically evident significant coronary  artery disease Nearly normal microvascular indicis   Above findings do not explain the severity of patient's chest pain shortness of breath.   I personally reviewed and compared echocardiograms.  Echocardiogram performed outpatient last week showed RVSP of 28 mmHg.  RVSP was noted to be 69 mmHg on inpatient echocardiogram yesterday.  Patient reportedly has history of PEs in the past.   Given significant only different RVSP, patient's profound symptoms not explained by coronary or cardiac etiology, I strongly suspect acute pulmonary cause such as pulmonary embolism.  I personally discussed this with patient and patient's wife, as well as hospitalist Dr. Beverly GustPavani.  We will proceed with CT angiogram chest, with additional hydration.  If CT angiogram does not reveal acute PE, I will perform right  heart catheterization tomorrow.    Laboratory Data:  High Sensitivity Troponin:   Recent Labs  Lab 10/11/22 2119 10/13/22 1748 10/13/22 1935 10/14/22 1252 10/15/22 0622  TROPONINIHS 24* 33* 36* 139* 86*     Chemistry Recent Labs  Lab 10/18/22 0209 10/18/22 0950 10/20/22 0151 10/21/22 0335 10/22/22 0718  NA 135  --  135 133* 134*  K 5.5*   < > 4.2 4.7 4.4  CL 99  --  105 101 101  CO2 26  --  24 25 25   GLUCOSE 148*  --  143* 134* 126*  BUN 36*  --  30* 28* 31*  CREATININE 1.09  --  1.09 1.05 1.08  CALCIUM 9.0  --  8.6* 8.5* 8.6*  MG 2.2  --   --  2.2 2.3  GFRNONAA >60  --  >60 >60 >60  ANIONGAP 10  --  6 7 8    < > = values in this interval not displayed.    No results for input(s): "PROT", "ALBUMIN", "AST", "ALT", "ALKPHOS", "BILITOT" in the last 168 hours. Lipids No results for input(s): "CHOL", "TRIG", "HDL", "LABVLDL", "LDLCALC", "CHOLHDL" in the last 168 hours.  Hematology Recent Labs  Lab 10/20/22 0151 10/21/22 0335 10/22/22 0718  WBC 12.6* 12.4* 11.1*  RBC 2.61* 2.51* 2.47*  HGB 7.9* 7.6* 7.5*  HCT 24.4* 23.5* 22.9*  MCV 93.5 93.6 92.7  MCH 30.3 30.3 30.4  MCHC 32.4  32.3 32.8  RDW 14.6 15.2 14.9  PLT 179 202 229   Thyroid No results for input(s): "TSH", "FREET4" in the last 168 hours.  BNPNo results for input(s): "BNP", "PROBNP" in the last 168 hours.  DDimer No results for input(s): "DDIMER" in the last 168 hours.   Radiology/Studies:  No results found.   Assessment and Plan:   PAF has had some today.   Chest pain with non obstructive disease on cardiac cath 10/14/22 Bilateral submassive saddle PE with IR thrombectomy and TPA  continues on IV heparin   Rt radial artery hematoma after TPA with compartment syndrome and urgent fasciotomy and Vac, with 2 procedures since for washout and change VAC.   Risk Assessment/Risk Scores:          CHA2DS2-VASc Score = 3   This indicates a 3.2% annual risk of stroke. The patient's score is based upon: CHF History: 0 HTN History: 1 Diabetes History: 0 Stroke History: 0 Vascular Disease History: 0 Age Score: 2 Gender Score: 0         For questions or updates, please contact [facility] Please consult [email or web address] for contact info under    Signed, Nada Boozer, NP  10/22/2022 11:51 AM  Personally seen and examined. Agree with APP above with the following comments:  Briefly 82 yo M with a history of non obstructive with a history of non obstructive CAD, Mild AI, prior history of PE who presented with chest pain.  Patient notes that he was walking and down the [place] with no symptoms prior.  Even now, he notes a bit of SOB but feels well. He and his wife recount the chest pain he was feeling PTA.  He took some of his wife's PRN Nitro, with no relief. Ultimately found to have non-obstructive CAD, but with new elevation in RVSP. Found to have new PE. PERT Team activation was performed, he had intra-procedural thrombolytics.  This lead to R radial pseudoaneurysm and compartment syndrome.    He had his second surgery today  Has been recovering  well.  On exam, he is in an irregular  heart rhythm. Has a diastolic murmur.  CTAB. His R arm vac is well dressed.  Small R femoral hematoma from PERT intervention.  EKG: Sinus tachycardia with PACs TELE: Has had some short runs of AF, otherwise PACs and PVCs.  Would recommend  - if not further procedures tomorrow, likely DOAC start for combination recurrent PE and PAF - if BP is persistent elevated and with ectopy, can change low dose metoprolol to coreg 6.25 mg PO BID (just came back from PACU) - will send home on higher statin - we discussed long term DOAC use; he and wife are waiting for occupation therapy evaluation  I suspect he may lead in the new two-three days.  Cardiac conditions are well managed. If no concerning arrhythmias and better BP control tomorrow potential SO for 10/23/22.  He can be overbooked with me for non urgent hospital follow up.  Riley Lam, MD FASE [facility] Cardiologist [facility]  [address] [place], [address] [place], [place] [address] [phone]  5:14 PM

## 2022-10-22 NOTE — Transfer of Care (Signed)
Immediate Anesthesia Transfer of Care Note  Patient: Ralph Blackwell  Procedure(s) Performed: REPEAT IRRIGATION AND DEBRIDEMENT RIGHT ARM WITH WOUND VAC CHANGE WITH MYRIAD PLACEMENT (Right: Arm Lower)  Patient Location: PACU  Anesthesia Type:General  Level of Consciousness: awake, drowsy, patient cooperative, and responds to stimulation  Airway & Oxygen Therapy: Patient Spontanous Breathing and Patient connected to nasal cannula oxygen  Post-op Assessment: Report given to RN and Post -op Vital signs reviewed and stable  Post vital signs: Reviewed and stable  Last Vitals:  Vitals Value Taken Time  BP    Temp    Pulse    Resp    SpO2      Last Pain:  Vitals:   10/22/22 1016  TempSrc:   PainSc: 3       Patients Stated Pain Goal: 0 (10/20/22 1400)  Complications: No notable events documented.

## 2022-10-22 NOTE — Progress Notes (Addendum)
[facility]  Subjective  - No new complaints   Objective 110/63 84 98.3 F (36.8 C) (Oral) 18 100%  Intake/Output Summary (Last 24 hours) at 10/22/2022 0805 Last data filed at 10/22/2022 0514 Gross per 24 hour  Intake 362.17 ml  Output 1610 ml  Net -1247.83 ml    Vac with good seal, 85 cc total OP in vac Hand warm with gross motor intact Lungs non labored breathing General no acute distress   Assessment/Planning: POD # 2 1.  Washout of right forearm fasciotomy including evacuation of hematoma 2.  Sharp excisional debridement of skin, subcutaneous tissue, muscle right forearm wound 3.  Placement of 15 French Blake drain right forearm wound 4.  Placement of 500 mg myriad morsels right forearm wound for skin substitute 5.  VAC change of right forearm fasciotomy wound with white sponge x1 and black sponge x1 (wound measures 12 cm x 8 cm x 2 cm deep)   NPO agrees to proceed to OR for I & D of the right forearm with vac change HGB 7.5 followed by Primary team Leukocytosis improving  Mosetta Pigeon 10/22/2022 8:05 AM --  Laboratory Lab Results: Recent Labs    10/21/22 0335 10/22/22 0718  WBC 12.4* 11.1*  HGB 7.6* 7.5*  HCT 23.5* 22.9*  PLT 202 229   BMET Recent Labs    10/20/22 0151 10/21/22 0335  NA 135 133*  K 4.2 4.7  CL 105 101  CO2 24 25  GLUCOSE 143* 134*  BUN 30* 28*  CREATININE 1.09 1.05  CALCIUM 8.6* 8.5*    COAG Lab Results  Component Value Date   INR 1.3 (H) 10/15/2022   INR 1.3 (H) 10/15/2022   INR 1.8 (H) 01/07/2008   No results found for: "PTT"   I have seen and evaluated the patient. I agree with the PA note as documented above.  Will plan washout and VAC change to right forearm fasciotomy wound.  Possible additional myriad placement.  Cephus Shelling, MD [facility] Office: [phone]

## 2022-10-22 NOTE — Progress Notes (Signed)
PROGRESS NOTE    Ralph Blackwell  IAX:[id number] DOB: 1940-07-27 DOA: 10/11/2022 PCP: Joya Martyr, MD (Inactive)   Brief Narrative:  Ralph Blackwell is a 82 year old gentleman who presented emergency department with chest pain, shortness of breath. He was given nitroglycerin, He was also eventually placed on Nitropaste.  Due to concern of unstable angina, he was started on heparin drip.  Cardiology consulted, he underwent cardiac cath on 10/13/2022 with clean coronaries.  Of note, they traveled approximately 3 weeks ago to the [place].  Due to him having past history of PE, this raised concern for possible PE.  CTA was obtained after cath on 10/13/2022 and he was diagnosed to have saddle PE.  IR and PCCM was consulted and he was transferred to ICU and underwent catheter-directed TPA/lysis with IR 4/2. Initially tolerated well, but late 4/2PM-4/3AM developed severe pain, bruising and swelling of RUE with concern for compartment syndrome of R forearm. Underwent fasciotomy with VVS 4/3 where source was prior arteriotomy site from cath; [facility] placed. Now s/p second washout/hematoma evacuation/WV replacement with VVS. Remains on heparin gtt for resolving PE. Hemodynamically stable for several days.  Transferred under [facility] on 10/22/2022.  Assessment & Plan:   Principal Problem:   Unstable angina Active Problems:   Essential hypertension   Mixed hyperlipidemia   Thrombocytopenia   Atypical angina   Acute pulmonary embolism with acute cor pulmonale   Hematoma of forearm   Compartment syndrome of right hand   Thrombocytopenia, unspecified   PAF (paroxysmal atrial fibrillation)  Chest pain: Underwent cardiac cath, was found to have clean coronaries.  Was then found to have saddle PE.  Submassive saddle PE s/p catheter directed lytics: Currently hemodynamically stable and remains on heparin drip.  Will need outpatient echo to reevaluate RV function posttreatment.  Will require minimum of 3 months of  anticoagulation.  Compartment syndrome of the right forearm: hand swelling and hematoma from bleeding into the forearm from previous catheter access site. Unfortunately this is an iatrogenic complication of tPA administration for pulmonary embolism.  Radial artery pseudoaneurysm, s/p repair.Underwent fasciotomy with VVS 4/3 where source was prior arteriotomy site from cath; [facility] placed. Now s/p second washout/hematoma evacuation/WV replacement with VVS. Remains on heparin gtt for resolving PE.  He is scheduled for another washout and wound VAC replacement today.  Acute blood loss anemia due to bleeding into forearm: Patient received total of 2 units of PRBC transfusion on 10/14/2022 and 10/15/2022.  Hemoglobin has remained stable ever since.  Monitor and transfuse if less than 7.  Thrombocytopenia, chronic: Improved  Mixed hyperlipidemia: Continue statin.  Essential hypertension: Controlled.  Continue Avapro and Toprol-XL.  GERD: Continue PPI.  DVT prophylaxis: SCD's Start: 10/13/22 1110 heparin   Code Status: Full Code  Family Communication: Wife present at bedside.  Plan of care discussed with patient in length and he/she verbalized understanding and agreed with it.  Status is: Inpatient Remains inpatient appropriate because: Scheduled for another washout of the right upper extremity with vascular surgery today.   Estimated body mass index is 29.65 kg/m as calculated from the following:   Height as of this encounter: 5' 9.5" (1.765 m).   Weight as of this encounter: 92.4 kg.    Nutritional Assessment: Body mass index is 29.65 kg/m.Marland Kitchen Seen by dietician.  I agree with the assessment and plan as outlined below: Nutrition Status:        . Skin Assessment: I have examined the patient's skin and I agree with the wound  assessment as performed by the wound care RN as outlined below:    Consultants:  Cardiology  Procedures:  As above  Antimicrobials:  Anti-infectives (From  admission, onward)    Start     Dose/Rate Route Frequency Ordered Stop   10/20/22 1107  ceFAZolin (ANCEF) 2-4 GM/100ML-% IVPB       Note to Pharmacy: [place] [place] L: cabinet override      10/20/22 1107 10/20/22 1115   10/17/22 0600  ceFAZolin (ANCEF) IVPB 2g/100 mL premix       Note to Pharmacy: Send with pt to OR   2 g 200 mL/hr over 30 Minutes Intravenous To Short Stay 10/16/22 0738 10/18/22 0600         Subjective: Patient seen and examined.  Wife at the bedside.  Patient feels well.  No pain.  No other complaint.  Objective: Vitals:   10/21/22 1930 10/21/22 2316 10/22/22 0513 10/22/22 0815  BP: 137/60 (!) 116/55 110/63 127/77  Pulse: 93 99 84 89  Resp: 17 14 18 15   Temp: 98.4 F (36.9 C) 99.1 F (37.3 C) 98.3 F (36.8 C) 98.6 F (37 C)  TempSrc: Oral Oral Oral Oral  SpO2: 98% 97% 100% 95%  Weight:      Height:        Intake/Output Summary (Last 24 hours) at 10/22/2022 0827 Last data filed at 10/22/2022 0514 Gross per 24 hour  Intake 362.17 ml  Output 1610 ml  Net -1247.83 ml    Filed Weights   10/14/22 0300 10/15/22 1417 10/18/22 0500  Weight: 92.4 kg 93 kg 92.4 kg    Examination: General exam: Appears calm and comfortable  Respiratory system: Clear to auscultation. Respiratory effort normal. Cardiovascular system: S1 & S2 heard, RRR. No JVD, murmurs, rubs, gallops or clicks. No pedal edema. Gastrointestinal system: Abdomen is nondistended, soft and nontender. No organomegaly or masses felt. Normal bowel sounds heard. Central nervous system: Alert and oriented. No focal neurological deficits. Extremities: Right upper extremity edematous and bruised.  Wound VAC attached. Skin: No rashes, lesions or ulcers.  Psychiatry: Judgement and insight appear normal. Mood & affect appropriate.   Data Reviewed: I have personally reviewed following labs and imaging studies  CBC: Recent Labs  Lab 10/18/22 0209 10/19/22 0150 10/20/22 0151 10/21/22 0335  10/22/22 0718  WBC 12.4* 11.9* 12.6* 12.4* 11.1*  HGB 8.7* 7.8* 7.9* 7.6* 7.5*  HCT 26.6* 23.8* 24.4* 23.5* 22.9*  MCV 92.4 91.5 93.5 93.6 92.7  PLT 130* 144* 179 202 229    Basic Metabolic Panel: Recent Labs  Lab 10/16/22 0140 10/17/22 1021 10/18/22 0209 10/18/22 0950 10/20/22 0151 10/21/22 0335 10/22/22 0718  NA 131* 135 135  --  135 133* 134*  K 4.6 4.8 5.5* 4.5 4.2 4.7 4.4  CL 103 101 99  --  105 101 101  CO2 22 24 26   --  24 25 25   GLUCOSE 183* 136* 148*  --  143* 134* 126*  BUN 25* 34* 36*  --  30* 28* 31*  CREATININE 1.05 1.21 1.09  --  1.09 1.05 1.08  CALCIUM 8.5* 8.9 9.0  --  8.6* 8.5* 8.6*  MG 2.1 2.1 2.2  --   --  2.2 2.3  PHOS 4.5 3.3 3.6  --   --   --   --     GFR: Estimated Creatinine Clearance: 62.8 mL/min (by C-G formula based on SCr of 1.08 mg/dL). Liver Function Tests: No results for input(s): "AST", "ALT", "ALKPHOS", "BILITOT", "  PROT", "ALBUMIN" in the last 168 hours.  No results for input(s): "LIPASE", "AMYLASE" in the last 168 hours. No results for input(s): "AMMONIA" in the last 168 hours. Coagulation Profile: Recent Labs  Lab 10/15/22 1226 10/15/22 2120  INR 1.3* 1.3*   Cardiac Enzymes: Recent Labs  Lab 10/18/22 0209  CKTOTAL 353   BNP (last 3 results) No results for input(s): "PROBNP" in the last 8760 hours. HbA1C: No results for input(s): "HGBA1C" in the last 72 hours. CBG: Recent Labs  Lab 10/21/22 0753 10/21/22 1140 10/21/22 1602 10/21/22 2136 10/22/22 0631  GLUCAP 205* 124* 170* 155* 126*   Lipid Profile: No results for input(s): "CHOL", "HDL", "LDLCALC", "TRIG", "CHOLHDL", "LDLDIRECT" in the last 72 hours. Thyroid Function Tests: No results for input(s): "TSH", "T4TOTAL", "FREET4", "T3FREE", "THYROIDAB" in the last 72 hours. Anemia Panel: No results for input(s): "VITAMINB12", "FOLATE", "FERRITIN", "TIBC", "IRON", "RETICCTPCT" in the last 72 hours. Sepsis Labs: No results for input(s): "PROCALCITON", "LATICACIDVEN"  in the last 168 hours.  Recent Results (from the past 240 hour(s))  MRSA Next Gen by PCR, Nasal     Status: None   Collection Time: 10/13/22  6:41 PM   Specimen: Nasal Mucosa; Nasal Swab  Result Value Ref Range Status   MRSA by PCR Next Gen NOT DETECTED NOT DETECTED Final    Comment: (NOTE) The GeneXpert MRSA Assay (FDA approved for NASAL specimens only), is one component of a comprehensive MRSA colonization surveillance program. It is not intended to diagnose MRSA infection nor to guide or monitor treatment for MRSA infections. Test performance is not FDA approved in patients less than 80 years old. Performed at [facility], [address] [place], [place] [address]      Radiology Studies: No results found.  Scheduled Meds:  allopurinol  150 mg Oral Daily   aspirin EC  81 mg Oral Daily   Chlorhexidine Gluconate Cloth  6 each Topical Daily   finasteride  5 mg Oral QHS   insulin aspart  1-3 Units Subcutaneous TID PC & HS   metoprolol tartrate  12.5 mg Oral BID   pantoprazole  80 mg Oral Daily   rosuvastatin  40 mg Oral Daily   sodium chloride flush  3 mL Intravenous Q12H   sodium chloride flush  3 mL Intravenous Q12H   Continuous Infusions:  sodium chloride     sodium chloride     sodium chloride Stopped (10/15/22 1235)   sodium chloride Stopped (10/15/22 1235)   heparin 1,300 Units/hr (10/22/22 0514)     LOS: 10 days   Hughie Clossavi Capricia Serda, MD [facility]  10/22/2022, 8:27 AM   *Please note that this is a verbal dictation therefore any spelling or grammatical errors are due to the "Dragon Medical One" system interpretation.  Please page via Amion and do not message via secure chat for urgent patient care matters. Secure chat can be used for non urgent patient care matters.  How to contact the [facility] Attending or Consulting provider 7A - 7P or covering provider during after hours 7P -7A, for this patient?  Check the care team in [facility] and look for a)  attending/consulting [facility] provider listed and b) the [facility] team listed. Page or secure chat 7A-7P. Log into [email or web address] and use [place]'s universal password to access. If you do not have the password, please contact the hospital operator. Locate the [facility] provider you are looking for under [facility] and page to a number that you can be directly  reached. If you still have difficulty reaching the provider, please page the [facility] (Director on Call) for the Hospitalists listed on amion for assistance.

## 2022-10-22 NOTE — Care Management Important Message (Signed)
Important Message  Patient Details  Name: Ralph Blackwell MRN: [id number] Date of Birth: [birth year]   Medicare Important Message Given:  Yes     Renie Ora 10/22/2022, 12:22 PM

## 2022-10-22 NOTE — Progress Notes (Signed)
ANTICOAGULATION CONSULT NOTE  Pharmacy Consult for Heparin Indication: chest pain/ACS  No Known Allergies  Patient Measurements: Height: 5' 9.5" (176.5 cm) Weight: 92.4 kg (203 lb 11.3 oz) IBW/kg (Calculated) : 71.85 Heparin Dosing Weight: 90.8 kg  Vital Signs: Temp: 99.1 F (37.3 C) (04/09 2316) Temp Source: Oral (04/09 2316) BP: 116/55 (04/09 2316) Pulse Rate: 99 (04/09 2316)  Labs: Recent Labs    10/19/22 0150 10/20/22 0151 10/21/22 0335 10/21/22 1140 10/21/22 2232  HGB 7.8* 7.9* 7.6*  --   --   HCT 23.8* 24.4* 23.5*  --   --   PLT 144* 179 202  --   --   HEPARINUNFRC 0.38 0.31 0.21* 0.20* 0.29*  CREATININE  --  1.09 1.05  --   --      Estimated Creatinine Clearance: 64.6 mL/min (by C-G formula based on SCr of 1.05 mg/dL).   Assessment: 40 yoM admitted with CP with clean cath. CT chest showed PE. Pt started on IV heparin. IR placed bilateral PE lysis catheters 4/2 x12h and heparin continued. Pt with compartment syndrome and taken to OR for fasciotomy 4/3, heparin resumed postop.  4/10 AM update:  Heparin level just below goal Hgb has been low but stable  Goal of Therapy:  Heparin level 0.3-0.5 units/ml  Monitor platelets by anticoagulation protocol: Yes   Plan:  Increase heparin to 1300 units/hr Heparin level with AM labs  Abran Duke, PharmD, BCPS Clinical Pharmacist Phone: [phone]

## 2022-10-22 NOTE — Anesthesia Postprocedure Evaluation (Signed)
Anesthesia Post Note  Patient: Ralph Blackwell  Procedure(s) Performed: REPEAT IRRIGATION AND DEBRIDEMENT RIGHT ARM WITH WOUND VAC CHANGE WITH MYRIAD PLACEMENT (Right: Arm Lower)     Patient location during evaluation: PACU Anesthesia Type: General Level of consciousness: awake and alert, oriented and patient cooperative Pain management: pain level controlled Vital Signs Assessment: post-procedure vital signs reviewed and stable Respiratory status: spontaneous breathing, nonlabored ventilation and respiratory function stable Cardiovascular status: blood pressure returned to baseline and stable Postop Assessment: no apparent nausea or vomiting Anesthetic complications: no   No notable events documented.  Last Vitals:  Vitals:   10/22/22 0815 10/22/22 1453  BP: 127/77 (!) 182/71  Pulse: 89 74  Resp: 15 12  Temp: 37 C 36.9 C  SpO2: 95% 99%    Last Pain:  Vitals:   10/22/22 1453  TempSrc:   PainSc: Asleep                 Lannie Fields

## 2022-10-22 NOTE — Progress Notes (Signed)
PT Cancellation Note  Patient Details Name: Ralph Blackwell MRN: [id number] DOB: [birth year]   Cancelled Treatment:    Reason Eval/Treat Not Completed: (P) Patient at procedure or test/unavailable (Pt worked with OT, now leaving for OR. Will continue efforts per PT plan of care as schedule permits.)   Angus Palms 10/22/2022, 11:32 AM

## 2022-10-22 NOTE — Progress Notes (Signed)
ANTICOAGULATION CONSULT NOTE - Follow Up Consult  Pharmacy Consult for Heparin Indication: pulmonary embolus  No Known Allergies  Patient Measurements: Height: 5' 9.5" (176.5 cm) Weight: 92.4 kg (203 lb 11.3 oz) IBW/kg (Calculated) : 71.85 Heparin Dosing Weight: 90 kg  Vital Signs: Temp: 98.8 F (37.1 C) (04/10 2000) Temp Source: Oral (04/10 2000) BP: 134/59 (04/10 2000) Pulse Rate: 76 (04/10 1615)  Labs: Recent Labs    10/20/22 0151 10/21/22 0335 10/21/22 1140 10/21/22 2232 10/22/22 0718 10/22/22 1917  HGB 7.9* 7.6*  --   --  7.5*  --   HCT 24.4* 23.5*  --   --  22.9*  --   PLT 179 202  --   --  229  --   HEPARINUNFRC 0.31 0.21*   < > 0.29* 0.43 0.35  CREATININE 1.09 1.05  --   --  1.08  --    < > = values in this interval not displayed.     Estimated Creatinine Clearance: 62.8 mL/min (by C-G formula based on SCr of 1.08 mg/dL).  Assessment: 89 yoM admitted with CP with clean cath. CT chest showed PE. Pt started on IV heparin. IR placed bilateral PE lysis catheters 4/2 x12h and heparin continued. Pt with compartment syndrome and taken to OR for fasciotomy and evacuation of hematoma on 4/3, then washout on 4/5 and 4/8. He had washout, I&D, and vac change today.   Heparin level is therapeutic (0.35) on 1300 units/hr. Confirmed heparin drip was not stopped for surgery. No bleeding noted. Hgb 7.5, low stable.  Platelet count improved to 229.  Goal of Therapy:  Heparin level 0.3-0.5 units/ml Monitor platelets by anticoagulation protocol: Yes   Plan:  Continue heparin drip at 1300 units/hr Daily heparin level and CBC Monitor for s/sx of bleeding  Thank you for involving pharmacy in this patient's care.  Loura Back, PharmD, BCPS Clinical Pharmacist Clinical phone for 10/22/2022 is [phone] 10/22/2022 9:08 PM

## 2022-10-23 ENCOUNTER — Telehealth (HOSPITAL_COMMUNITY): Payer: Self-pay | Admitting: Pharmacy Technician

## 2022-10-23 ENCOUNTER — Other Ambulatory Visit (HOSPITAL_COMMUNITY): Payer: Self-pay

## 2022-10-23 DIAGNOSIS — I200 Unstable angina: Secondary | ICD-10-CM | POA: Diagnosis not present

## 2022-10-23 LAB — BASIC METABOLIC PANEL
Anion gap: 10 (ref 5–15)
BUN: 31 mg/dL — ABNORMAL HIGH (ref 8–23)
CO2: 24 mmol/L (ref 22–32)
Calcium: 8.7 mg/dL — ABNORMAL LOW (ref 8.9–10.3)
Chloride: 99 mmol/L (ref 98–111)
Creatinine, Ser: 1.11 mg/dL (ref 0.61–1.24)
GFR, Estimated: >60 mL/min (ref >60)
Glucose, Bld: 126 mg/dL — ABNORMAL HIGH (ref 70–99)
Potassium: 4.9 mmol/L (ref 3.5–5.1)
Sodium: 133 mmol/L — ABNORMAL LOW (ref 135–145)

## 2022-10-23 LAB — HEPARIN LEVEL (UNFRACTIONATED): Heparin Unfractionated: 0.3 IU/mL (ref 0.30–0.70)

## 2022-10-23 LAB — CBC WITH DIFFERENTIAL/PLATELET
Abs Immature Granulocytes: 0.21 10*3/uL — ABNORMAL HIGH (ref 0.00–0.07)
Basophils Absolute: 0 10*3/uL (ref 0.0–0.1)
Basophils Relative: 0 %
Eosinophils Absolute: 0.1 10*3/uL (ref 0.0–0.5)
Eosinophils Relative: 1 %
HCT: 25.3 % — ABNORMAL LOW (ref 39.0–52.0)
Hemoglobin: 8.1 g/dL — ABNORMAL LOW (ref 13.0–17.0)
Immature Granulocytes: 2 %
Lymphocytes Relative: 12 %
Lymphs Abs: 1.4 10*3/uL (ref 0.7–4.0)
MCH: 30 pg (ref 26.0–34.0)
MCHC: 32 g/dL (ref 30.0–36.0)
MCV: 93.7 fL (ref 80.0–100.0)
Monocytes Absolute: 0.9 10*3/uL (ref 0.1–1.0)
Monocytes Relative: 8 %
Neutro Abs: 8.9 10*3/uL — ABNORMAL HIGH (ref 1.7–7.7)
Neutrophils Relative %: 77 %
Platelets: 253 10*3/uL (ref 150–400)
RBC: 2.7 MIL/uL — ABNORMAL LOW (ref 4.22–5.81)
RDW: 15.1 % (ref 11.5–15.5)
WBC: 11.4 10*3/uL — ABNORMAL HIGH (ref 4.0–10.5)
nRBC: 0.5 % — ABNORMAL HIGH (ref 0.0–0.2)

## 2022-10-23 LAB — GLUCOSE, CAPILLARY
Glucose-Capillary: 135 mg/dL — ABNORMAL HIGH (ref 70–99)
Glucose-Capillary: 122 mg/dL — ABNORMAL HIGH (ref 70–99)
Glucose-Capillary: 118 mg/dL — ABNORMAL HIGH (ref 70–99)
Glucose-Capillary: 158 mg/dL — ABNORMAL HIGH (ref 70–99)
Glucose-Capillary: 169 mg/dL — ABNORMAL HIGH (ref 70–99)

## 2022-10-23 NOTE — Progress Notes (Signed)
PROGRESS NOTE    Ralph Blackwell  ZOX:[id number] DOB: 1941/01/09 DOA: 10/11/2022 PCP: Joya MartyrSchwartzman, Neil R, MD (Inactive)   Brief Narrative:  Ralph Blackwell is a 82 year old gentleman who presented emergency department with chest pain, shortness of breath. He was given nitroglycerin, He was also eventually placed on Nitropaste.  Due to concern of unstable angina, he was started on heparin drip.  Cardiology consulted, he underwent cardiac cath on 10/13/2022 with clean coronaries.  Of note, [place]  Due to him having past history of PE, this raised concern for possible PE.  CTA was obtained after cath on 10/13/2022 and he was diagnosed to have saddle PE.  IR and PCCM was consulted and he was transferred to ICU and underwent catheter-directed TPA/lysis with IR 4/2. Initially tolerated well, but late 4/2PM-4/3AM developed severe pain, bruising and swelling of RUE with concern for compartment syndrome of R forearm. Underwent fasciotomy with VVS 4/3 where source was prior [facility] Now s/p second washout/hematoma evacuation/WV replacement with VVS. Remains on heparin gtt for resolving PE. Hemodynamically stable for several days.  Transferred under [facility]H on 10/22/2022.  Assessment & Plan:   Principal Problem:   Unstable angina Active Problems:   Essential hypertension   Mixed hyperlipidemia   Thrombocytopenia   Atypical angina   Acute pulmonary embolism with acute cor pulmonale   Hematoma of forearm   Compartment syndrome of right hand   Thrombocytopenia, unspecified   PAF (paroxysmal atrial fibrillation)  Chest pain: Underwent cardiac cath, was found to have clean coronaries.  Was then found to have saddle PE.  Submassive saddle PE s/p catheter directed lytics: Currently hemodynamically stable and remains on heparin drip.  Will need outpatient echo to reevaluate RV function posttreatment.  Will require minimum of 3 months of  anticoagulation.  Waiting for further clarification about any further surgical plans from vascular and Ortho before we transition him to DOAC.  Compartment syndrome of the right forearm: hand swelling and hematoma from bleeding into the forearm from previous catheter access site. Unfortunately this is an iatrogenic complication of tPA administration for pulmonary embolism.  Radial artery pseudoaneurysm, s/p repair.Underwent fasciotomy with VVS 4/3 where source was prior arteriotomy site from cath[facility]. Now s/p second washout/hematoma evacuation/WV replacement with VVS. Remains on heparin gtt for resolving PE.  Underwent another washout of the right forearm fasciotomy and excisional debridement of muscle right forearm and VAC change.  Per vascular surgery, next VAC change will be in 8 to 10 days as outpatient.  Waiting for further clearance from Ortho and vascular surgery about any further surgical plans.  His right upper extremity still ecchymotic but improved compared to yesterday.  Hand function is improving as well.  Acute blood loss anemia due to bleeding into forearm: Patient received total of 2 units of PRBC transfusion on 10/14/2022 and 10/15/2022.  Hemoglobin has remained stable ever since.  Monitor and transfuse if less than 7.  Thrombocytopenia, chronic: Improved  Mixed hyperlipidemia: Continue statin.  Essential hypertension: Controlled.  Continue Avapro and Toprol-XL.  GERD: Continue PPI.  DVT prophylaxis: SCD's Start: 10/13/22 1110 heparin   Code Status: Full Code  Family Communication: None present at bedside.  Plan of care discussed with patient in length and he/she verbalized understanding and agreed with it.  Status is: Inpatient Remains inpatient appropriate because: Pending clearance from vascular surgery and orthopedics.   Estimated body mass index is 29.23 kg/m as calculated from  the following:   Height as of this encounter: 5' 9.5" (1.765 m).   Weight as of this  encounter: 91.1 kg.    Nutritional Assessment: Body mass index is 29.23 kg/m.Marland Kitchen Seen by dietician.  I agree with the assessment and plan as outlined below: Nutrition Status:        . Skin Assessment: I have examined the patient's skin and I agree with the wound assessment as performed by the wound care RN as outlined below:    Consultants:  Cardiology  Procedures:  As above  Antimicrobials:  Anti-infectives (From admission, onward)    Start     Dose/Rate Route Frequency Ordered Stop   10/20/22 1107  ceFAZolin (ANCEF) 2-4 GM/100ML-% IVPB       Note to Pharmacy: [place] L: cabinet override      10/20/22 1107 10/20/22 1115   10/17/22 0600  ceFAZolin (ANCEF) IVPB 2g/100 mL premix       Note to Pharmacy: Send with pt to OR   2 g 200 mL/hr over 30 Minutes Intravenous To Short Stay 10/16/22 0738 10/18/22 0600         Subjective: Patient seen and examined this morning.  Wife was not present at bedside.  Patient was feeling better.  Pain was controlled.  He just was not sure about the plans going forward.  He was advised to discuss with vascular surgery when they see him.  Objective: Vitals:   10/22/22 2331 10/23/22 0242 10/23/22 0534 10/23/22 0738  BP: (!) 126/57 (!) 113/55 126/67 134/66  Pulse: 86 84 84   Resp: 18 17 18 11   Temp: 98.7 F (37.1 C) 98 F (36.7 C) 98.2 F (36.8 C) 98.6 F (37 C)  TempSrc: Oral Oral Oral Oral  SpO2: 98% 99% 98%   Weight:   91.1 kg   Height:        Intake/Output Summary (Last 24 hours) at 10/23/2022 1153 Last data filed at 10/23/2022 0605 Gross per 24 hour  Intake 748.68 ml  Output 2060 ml  Net -1311.32 ml    Filed Weights   10/15/22 1417 10/18/22 0500 10/23/22 0534  Weight: 93 kg 92.4 kg 91.1 kg    Examination: General exam: Appears calm and comfortable  Respiratory system: Clear to auscultation. Respiratory effort normal. Cardiovascular system: S1 & S2 heard, RRR. No JVD, murmurs, rubs, gallops or clicks. No pedal  edema. Gastrointestinal system: Abdomen is nondistended, soft and nontender. No organomegaly or masses felt. Normal bowel sounds heard. Central nervous system: Alert and oriented. No focal neurological deficits. Extremities: Ecchymosis right upper extremity with dressing.  Wound VAC attached to the right upper extremity. Psychiatry: Judgement and insight appear normal. Mood & affect appropriate.   Data Reviewed: I have personally reviewed following labs and imaging studies  CBC: Recent Labs  Lab 10/19/22 0150 10/20/22 0151 10/21/22 0335 10/22/22 0718 10/23/22 0128  WBC 11.9* 12.6* 12.4* 11.1* 11.4*  NEUTROABS  --   --   --   --  8.9*  HGB 7.8* 7.9* 7.6* 7.5* 8.1*  HCT 23.8* 24.4* 23.5* 22.9* 25.3*  MCV 91.5 93.5 93.6 92.7 93.7  PLT 144* 179 202 229 253    Basic Metabolic Panel: Recent Labs  Lab 10/17/22 1021 10/18/22 0209 10/18/22 0950 10/20/22 0151 10/21/22 0335 10/22/22 0718 10/23/22 0128  NA 135 135  --  135 133* 134* 133*  K 4.8 5.5* 4.5 4.2 4.7 4.4 4.9  CL 101 99  --  105 101 101 99  CO2  24 26  --  24 25 25 24   GLUCOSE 136* 148*  --  143* 134* 126* 126*  BUN 34* 36*  --  30* 28* 31* 31*  CREATININE 1.21 1.09  --  1.09 1.05 1.08 1.11  CALCIUM 8.9 9.0  --  8.6* 8.5* 8.6* 8.7*  MG 2.1 2.2  --   --  2.2 2.3  --   PHOS 3.3 3.6  --   --   --   --   --     GFR: Estimated Creatinine Clearance: 60.8 mL/min (by C-G formula based on SCr of 1.11 mg/dL). Liver Function Tests: No results for input(s): "AST", "ALT", "ALKPHOS", "BILITOT", "PROT", "ALBUMIN" in the last 168 hours.  No results for input(s): "LIPASE", "AMYLASE" in the last 168 hours. No results for input(s): "AMMONIA" in the last 168 hours. Coagulation Profile: No results for input(s): "INR", "PROTIME" in the last 168 hours.  Cardiac Enzymes: Recent Labs  Lab 10/18/22 0209  CKTOTAL 353    BNP (last 3 results) No results for input(s): "PROBNP" in the last 8760 hours. HbA1C: No results for input(s):  "HGBA1C" in the last 72 hours. CBG: Recent Labs  Lab 10/22/22 0631 10/22/22 1823 10/22/22 2116 10/23/22 0542 10/23/22 0740  GLUCAP 126* 150* 193* 135* 122*    Lipid Profile: No results for input(s): "CHOL", "HDL", "LDLCALC", "TRIG", "CHOLHDL", "LDLDIRECT" in the last 72 hours. Thyroid Function Tests: No results for input(s): "TSH", "T4TOTAL", "FREET4", "T3FREE", "THYROIDAB" in the last 72 hours. Anemia Panel: No results for input(s): "VITAMINB12", "FOLATE", "FERRITIN", "TIBC", "IRON", "RETICCTPCT" in the last 72 hours. Sepsis Labs: No results for input(s): "PROCALCITON", "LATICACIDVEN" in the last 168 hours.  Recent Results (from the past 240 hour(s))  MRSA Next Gen by PCR, Nasal     Status: None   Collection Time: 10/13/22  6:41 PM   Specimen: Nasal Mucosa; Nasal Swab  Result Value Ref Range Status   MRSA by PCR Next Gen NOT DETECTED NOT DETECTED Final    Comment: (NOTE) The GeneXpert MRSA Assay (FDA approved for NASAL specimens only), is one component of a comprehensive MRSA colonization surveillance program. It is not intended to diagnose MRSA infection nor to guide or monitor treatment for MRSA infections. Test performance is not FDA approved in patients less than 50 years old. Performed at [facility], [address], [place], [place] [address]      Radiology Studies: No results found.  Scheduled Meds:  allopurinol  150 mg Oral Daily   aspirin EC  81 mg Oral Daily   Chlorhexidine Gluconate Cloth  6 each Topical Daily   finasteride  5 mg Oral QHS   insulin aspart  1-3 Units Subcutaneous TID PC & HS   metoprolol tartrate  12.5 mg Oral BID   pantoprazole  80 mg Oral Daily   rosuvastatin  40 mg Oral Daily   sodium chloride flush  3 mL Intravenous Q12H   sodium chloride flush  3 mL Intravenous Q12H   Continuous Infusions:  sodium chloride     sodium chloride     sodium chloride Stopped (10/15/22 1235)   sodium chloride Stopped (10/15/22 1235)   heparin  1,300 Units/hr (10/23/22 0605)     LOS: 11 days   Hughie Closs, MD [facility]  10/23/2022, 11:53 AM   *Please note that this is a verbal dictation therefore any spelling or grammatical errors are due to the "Dragon Medical One" system interpretation.  Please page via Amion and  do not message via secure chat for urgent patient care matters. Secure chat can be used for non urgent patient care matters.  How to contact the [facility] Attending or Consulting provider [place] or covering provider during after hours [place], for this patient?  Check the care team in [facility] and look for a) attending/consulting [facility] provider listed and b) the [facility] team listed. Page or secure chat [phone]. Log into [email or web address] and use [place]'s universal password to access. If you do not have the password, please contact the hospital operator. Locate the [facility] provider you are looking for under [facility] and page to a number that you can be directly reached. If you still have difficulty reaching the provider, please page the [facility] (Director on Call) for the Hospitalists listed on amion for assistance.

## 2022-10-23 NOTE — Progress Notes (Signed)
Per Dr. Acharya/Dr. Izora Ribas, patient transitioning from Dr. Rosemary Holms (appt cancelled) to Dr. Ronette Deter - per Dr. Izora Ribas OK to use slot on 5/2. Appt info relayed on AVS.

## 2022-10-23 NOTE — Telephone Encounter (Signed)
Pharmacy Patient Advocate Encounter  Insurance verification completed.    The patient is insured through [employer] Part D   The patient is currently admitted and ran test claims for the following: Eliquis, Xarelto.  Copays and coinsurance results were relayed to Inpatient clinical team.  

## 2022-10-23 NOTE — Progress Notes (Signed)
Physical Therapy Treatment Patient Details Name: Ralph Blackwell MRN: [id number] DOB: June 23, 1941 Today's Date: 10/23/2022   History of Present Illness 82 year old male that underwent right transradial cath with cardiology on 10/13/22. Ultimately was found to have PEs and underwent thrombolysis and then developed right radial pseudoaneurysm with hematoma and compartment syndrome.  He underwent evacuation of the hematoma of his forearm with fasciotomies including carpal tunnel release and repair of the pseudoaneurysm. Underwent wash out and vac change 10/17/22. PMH: CAD, HTN, PE, OSA, renal insufficiency, GERD, Gout, Hiatal Hernia.    PT Comments    Pt received in supine, agreeable to therapy session and with good effort for transfer and gait training in room and reciprocal step-ups on 7" platform for BLE strengthening. Pt needing heavy minA for bed mobility and min guard for safety with transfers/gait using bilateral platform RW. Pt tachy to 140's bpm with exertional tasks on RA, SpO2 WFL on RA, RN notified. Pt continues to benefit from PT services to progress toward functional mobility goals.   Recommendations for follow up therapy are one component of a multi-disciplinary discharge planning process, led by the attending physician.  Recommendations may be updated based on patient status, additional functional criteria and insurance authorization.  Follow Up Recommendations       Assistance Recommended at Discharge Frequent or constant Supervision/Assistance  Patient can return home with the following A little help with bathing/dressing/bathroom;Assistance with cooking/housework;Assist for transportation   Equipment Recommendations  Other (comment) (bilateral platform rollator)    Recommendations for Other Services       Precautions / Restrictions Precautions Precautions: Fall Precaution Comments: R arm wound vac and JP drain, significant amount of lines Restrictions Weight Bearing  Restrictions: No Other Position/Activity Restrictions: Keep RUE elevated when resting     Mobility  Bed Mobility Overal bed mobility: Needs Assistance Bed Mobility: Supine to Sit     Supine to sit: Min assist, HOB elevated Sit to supine: Min guard   General bed mobility comments: Assist for trunk raising and stability while scooting hips anterior; light BLE assist to return to supine.    Transfers Overall transfer level: Needs assistance Equipment used:  (bilateral platform rollator) Transfers: Sit to/from Stand Sit to Stand: Min guard           General transfer comment: from EOB<>bilateral platform rollator, pt pushing from lower walker handles with LUE and good awareness of safety/lines while placing RUE on cushioned forearm rest.    Ambulation/Gait Ambulation/Gait assistance: Min guard Gait Distance (Feet): 30 Feet (22ft, seated break, 62ft) Assistive device:  (bilateral platform rollator) Gait Pattern/deviations: Step-through pattern, Decreased step length - right, Decreased step length - left Gait velocity: slowed     General Gait Details: min guard for safety and mangement of equipment with slow, steady ambulation to doorway of room; had pt take standing rest break when HR entered 130s, then seated break prior to attempting stair trial, and for return back to bed with tachycardia to 140's post-exertion. Defer hallway gait trial due to elevated HR and pt fatigue after step-ups x10. No dizziness reported, but moderate DOE 2-3/4 after step-ups.   Stairs Stairs: Yes Stairs assistance: Min guard Stair Management: One rail Left, Step to pattern, Forwards, Backwards Number of Stairs: 10 (5 steps x2 trials with standing break ~2 mins between) General stair comments: cues for safety and step sequencing as well as activity pacing; HR 110-125 bpm with initial 5 step-ups. After standing break, pt performed 5 more in room then HR  elevated to >140 and pt agreeable to return back  to bed. DOE 3/4 on RA with exertional tasks, SpO2 reading 97% and above throughout on RA when pleth signal achieved.   Wheelchair Mobility    Modified Rankin (Stroke Patients Only)       Balance Overall balance assessment: Modified Independent                                          Cognition Arousal/Alertness: Awake/alert Behavior During Therapy: WFL for tasks assessed/performed Overall Cognitive Status: Within Functional Limits for tasks assessed                                          Exercises      General Comments General comments (skin integrity, edema, etc.): SpO2 WFL on RA with ambulation in room, DOE 2-3/4 with exertional tasks (7" platform step in room), HR 120's bpm with gait and up to 140-144 bpm post-stairs, returned to supine. Pt placed back on 2L O2 [place] upon return to supine given elevated HR (not hypoxic on RA however), RN notified.      Pertinent Vitals/Pain Pain Assessment Pain Assessment: 0-10 Pain Score: 4  Pain Location: R forearm and hand Pain Descriptors / Indicators: Dull, Throbbing Pain Intervention(s): Limited activity within patient's tolerance, Repositioned, Monitored during session, Premedicated before session     PT Goals (current goals can now be found in the care plan section) Acute Rehab PT Goals Patient Stated Goal: get back to walking with my dog and yard work PT Goal Formulation: With patient/family Time For Goal Achievement: 10/31/22 Progress towards PT goals: Progressing toward goals    Frequency    Min 1X/week      PT Plan Current plan remains appropriate       AM-PAC PT "6 Clicks" Mobility   Outcome Measure  Help needed turning from your back to your side while in a flat bed without using bedrails?: None Help needed moving from lying on your back to sitting on the side of a flat bed without using bedrails?: A Little Help needed moving to and from a bed to a chair (including a  wheelchair)?: A Little Help needed standing up from a chair using your arms (e.g., wheelchair or bedside chair)?: A Little Help needed to walk in hospital room?: A Little Help needed climbing 3-5 steps with a railing? : A Little 6 Click Score: 19    End of Session Equipment Utilized During Treatment: Gait belt;Oxygen Activity Tolerance: Treatment limited secondary to medical complications (Comment);Other (comment) (tachycardia on RA with exertion (SpO2 WFL)) Patient left: in bed;with call bell/phone within reach;with bed alarm set;Other (comment) (RUE elevated) Nurse Communication: Mobility status;Other (comment) (tachy with exertion, SpO2 WFL on RA but back on 2L at rest due to higher HR) PT Visit Diagnosis: Difficulty in walking, not elsewhere classified (R26.2)     Time: 3785-8850 PT Time Calculation (min) (ACUTE ONLY): 48 min  Charges:  $Gait Training: 23-37 mins $Therapeutic Activity: 8-22 mins                     Dezmen Alcock P., PTA Acute Rehabilitation Services Secure Chat Preferred 9a-5:30pm Office: [phone]    Dorathy Kinsman [facility] 10/23/2022, 4:59 PM

## 2022-10-23 NOTE — Progress Notes (Addendum)
  Progress Note    10/23/2022 8:04 AM 1 Day Post-Op  Subjective:  no complaints. Eating breakfast   Vitals:   10/23/22 0534 10/23/22 0738  BP: 126/67 134/66  Pulse: 84   Resp: 18 11  Temp: 98.2 F (36.8 C) 98.6 F (37 C)  SpO2: 98%    Physical Exam: Cardiac:  regular Lungs:  non labored Incisions: Right forearm with VAC to suction. Good seal.  JP with 24 cc overnight Extremities:  2+ right radial pulse. Hand warm, 3/5 grip strength, swelling and ecchymosis improving Neurologic: alert and oriented  CBC    Component Value Date/Time   WBC 11.4 (H) 10/23/2022 0128   RBC 2.70 (L) 10/23/2022 0128   HGB 8.1 (L) 10/23/2022 0128   HCT 25.3 (L) 10/23/2022 0128   PLT 253 10/23/2022 0128   MCV 93.7 10/23/2022 0128   MCH 30.0 10/23/2022 0128   MCHC 32.0 10/23/2022 0128   RDW 15.1 10/23/2022 0128   LYMPHSABS 1.4 10/23/2022 0128   MONOABS 0.9 10/23/2022 0128   EOSABS 0.1 10/23/2022 0128   BASOSABS 0.0 10/23/2022 0128    BMET    Component Value Date/Time   NA 133 (L) 10/23/2022 0128   NA 142 02/21/2021 1014   K 4.9 10/23/2022 0128   CL 99 10/23/2022 0128   CO2 24 10/23/2022 0128   GLUCOSE 126 (H) 10/23/2022 0128   BUN 31 (H) 10/23/2022 0128   BUN 20 02/21/2021 1014   CREATININE 1.11 10/23/2022 0128   CALCIUM 8.7 (L) 10/23/2022 0128   GFRNONAA >60 10/23/2022 0128   GFRAA 78 (L) 01/25/2013 1608    INR    Component Value Date/Time   INR 1.3 (H) 10/15/2022 2120     Intake/Output Summary (Last 24 hours) at 10/23/2022 0804 Last data filed at 10/23/2022 5361 Gross per 24 hour  Intake 748.68 ml  Output 2060 ml  Net -1311.32 ml     Assessment/Plan:  82 y.o. male is s/p 1.  Washout of right forearm fasciotomy 2.  Sharp excisional debridement of muscle right forearm wound 3.  Placement of 1000 mg myriad morcells and 10 x 20 cm myriad matrix right forearm wound for application of skin substitute 4.  VAC change of right forearm fasciotomy wound with white sponge x1  and black sponge x1 (wound measures 13 cm x 8 cm x 1 cm deep) 1 Day Post-Op   Right arm well perfused with palpable radial pulse Swelling and ecchymosis improving VAC with good seal. JP with 45 cc SS output overnight Continue PT/OT Patient doing good job of elevating his arm. Continue elevation Next VAC change will be as an outpatient in 8-10 days   Graceann Congress, [place] [facility] [phone] 10/23/2022 8:04 AM  I have seen and evaluated the patient. I agree with the PA note as documented above.  Discussed no further plans for OR during this admission after placement of myriad skin substitute yesterday.  Next VAC change would be in about 10 days in the office.  Will leave drain for now.  Can transition to DOAC from our standpoint and get pharmacy to assist with pricing.  We need PT OT to work on dispo planning.  He will need home VAC.  Cephus Shelling, MD [facility] [place] Office: [phone]

## 2022-10-23 NOTE — Op Note (Addendum)
Date of Surgery: 10/23/2022  INDICATIONS: Patient is a 82 y.o.-year-old male with right forearm compartment syndrome after a radial artery injury following a cardiac catheterization procedure.  He is now status post forearm fasciotomy with carpal tunnel release and a repeat I&D with partial closure of the wound and application of a wound vac.  His injury has been more complex due to his therapeutic heparinization for treatment and prevention of significant pulmonary embolism.  This has cause persistent but mild bleeding in the forearm compartment.  Risks, benefits, and alternatives to surgery were again discussed with the patient in the preoperative area. The patient wishes to proceed with surgery.  Informed consent was signed after our discussion.   PREOPERATIVE DIAGNOSIS:  Right forearm compartment syndrome Right radial artery injury  POSTOPERATIVE DIAGNOSIS: Same.  PROCEDURE:  Irrigation and excisional debridement of subcutaneous tissue and muscle using rongeur and scissor Placement of 1000 mg Myriad Morcells and 10 x 20 cm myriad matrix (skin substitute) Application of wound vac (wound 13 cm x 8 cm and 1 cm deep)   SURGEON: Waylan Rocher, M.D. Mammie LorenzoSherald Hess, M.D.  ASSIST:   ANESTHESIA:  general  IV FLUIDS AND URINE: See anesthesia.  ESTIMATED BLOOD LOSS: 10 mL.  IMPLANTS:  Implant Name Type Inv. Item Serial No. Manufacturer Lot No. LRB No. Used Action  DRESSING MORCELLS FINE 1000 - [id number] Tissue DRESSING MORCELLS FINE 1000  [employer] [id number] Right 1 Implanted  GRAFT MYRIAD 2 LAYER 10X20 - [id number] Graft GRAFT MYRIAD 2 LAYER 10X20 [id number] [employer] [id number] Right 1 Implanted     DRAINS: JP x 1 in deep ulnar aspect of forearm  COMPLICATIONS: None  DESCRIPTION OF PROCEDURE: The patient was met in the preoperative holding area where the surgical site was marked and the consent form was signed.  The patient was  then taken to the operating room and transferred to the operating table.  All bony prominences were well padded.  General endotracheal anesthesia was induced.  The wound VAC was turned off and the black and white sponges were removed uneventfully.  The operative extremity was prepped and draped in the usual and sterile fashion using betadine.  A formal time-out was performed to confirm that this was the correct patient, surgery, side, and site.   Following formal timeout, the open wound was inspected.  The muscle belly within the mid to distal third of the forearm.  Somewhat hemorrhagic with old, dark red blood staining.  Some of this muscle and subcutaneous tissue appeared to be nonviable and was debrided using a combination of scissor and rondure.  The superficial and deep muscles were stimulated with the Bovie electrocautery and were contractile.  There was a small amount of clot at the deep and ulnar aspect of the wound between the flexor carpi radialis and digitorum superficialis tendons.  This was evacuated.  There were no obvious bleeding vessels or sources of hemorrhage.  The end of the drain was then repositioned to sit within this interval to allow for further drainage if needed.  The skin was still too taut to be able to close.  Fortunately, the significant ecchymosis of the volar forearm skin appears to be resolving.  At this point, the wound was thoroughly irrigated with copious sterile saline.  One gram of myrid morcells were placed into the wound to help with wound granulation.  Next, 10 x 20 cm sheet of myriad matrix was trimmed to fit the wound and was placed over the  exposed muscle belly.  A white and black sponge was then trimmed trimmed to fit the wound.  This was held in place using [place].  The wound overall was 13 x 8 x 1 in dimension.  The Lilly pad was applied to the central portion of the punch and the wound VAC connected.  There was great seal.  The wound was dressed with cast padding and  Ace wrap.  The patient was reversed from anesthesia and extubated uneventfully.  They were transferred from the operating table to the postoperative bed.  All counts were correct x 2 at the end of the procedure.  The patient was then taken to the PACU in stable condition.   POSTOPERATIVE PLAN: Patient will remain on the floor.  The wound VAC can come down in 7 to 10 days for evaluation of the wound.  This may or may not eventually need a split-thickness skin graft versus allowing the wound to heal by granulation and secondary intention.  Waylan Rocher, MD 8:14 AM

## 2022-10-23 NOTE — Progress Notes (Signed)
ANTICOAGULATION CONSULT NOTE - Follow Up Consult  Pharmacy Consult for Heparin Indication: pulmonary embolus  No Known Allergies  Patient Measurements: Height: 5' 9.5" (176.5 cm) Weight: 91.1 kg (200 lb 13.4 oz) IBW/kg (Calculated) : 71.85 Heparin Dosing Weight: 90 kg  Vital Signs: Temp: 98.4 F (36.9 C) (04/11 1200) Temp Source: Oral (04/11 1200) BP: 126/81 (04/11 1200) Pulse Rate: 84 (04/11 0534)  Labs: Recent Labs    10/21/22 0335 10/21/22 1140 10/22/22 0718 10/22/22 1917 10/23/22 0128 10/23/22 1158  HGB 7.6*  --  7.5*  --  8.1*  --   HCT 23.5*  --  22.9*  --  25.3*  --   PLT 202  --  229  --  253  --   HEPARINUNFRC 0.21*   < > 0.43 0.35  --  0.30  CREATININE 1.05  --  1.08  --  1.11  --    < > = values in this interval not displayed.     Estimated Creatinine Clearance: 60.8 mL/min (by C-G formula based on SCr of 1.11 mg/dL).  Assessment: 42 yoM admitted with CP with clean cath. CT chest showed PE. Pt started on IV heparin. IR placed bilateral PE lysis catheters 4/2 x12h and heparin continued. Pt with compartment syndrome and taken to OR for fasciotomy and evacuation of hematoma on 4/3, then washout on 4/5 and 4/8. He had washout, I&D, and vac change 4/10  Heparin level is therapeutic (0.30) on 1300 units/hr. .   Goal of Therapy:  Heparin level 0.3-0.5 units/ml Monitor platelets by anticoagulation protocol: Yes   Plan:  Increase heparin to 1400 units/hr to keep in goal Daily heparin level and CBC   Thank you for involving pharmacy in this patient's care.  Harland German, PharmD Clinical Pharmacist **Pharmacist phone directory can now be found on [email or web address] ([email or web address]).  Listed under [facility].

## 2022-10-23 NOTE — Progress Notes (Signed)
Mobility Specialist: Progress Note   10/23/22 1159  Mobility  Activity Ambulated with assistance in hallway  Level of Assistance Contact guard assist, steadying assist  Assistive Device Four wheel walker (EVA)  Distance Ambulated (ft) 1000 ft  Activity Response Tolerated well  Mobility Referral Yes  $Mobility charge 1 Mobility   During Mobility: 131 HR Post-Mobility: 113 HR  Pt received in the bed and agreeable to mobility. Ambulate on 2 L/min [place]. Mod I with bed mobility and contact guard during ambulation. No c/o throughout. Pt to the chair after session with call bell in reach and pt's wife present in the room.   Dennys Guin Mobility Specialist Please contact via SecureChat or Rehab office at [phone]

## 2022-10-23 NOTE — TOC Benefit Eligibility Note (Signed)
Patient Advocate Encounter  Insurance verification completed.    The patient is currently admitted and upon discharge could be taking Eliquis 5 mg.  The current 30 day co-pay is $47.00.   The patient is currently admitted and upon discharge could be taking Xarelto 20 mg.  The current 30 day co-pay is $47.00.   The patient is insured through [employer] Medicare Part D   This test claim was processed through [facility]- copay amounts may vary at other pharmacies due to pharmacy/plan contracts, or as the patient moves through the different stages of their insurance plan.  Ellah Otte, CPHT Pharmacy Patient Advocate Specialist [facility] Patient Advocate Team Direct Number: [phone]  Fax: [phone]       

## 2022-10-24 DIAGNOSIS — I200 Unstable angina: Secondary | ICD-10-CM | POA: Diagnosis not present

## 2022-10-24 LAB — CBC
HCT: 23.2 % — ABNORMAL LOW (ref 39.0–52.0)
Hemoglobin: 7.4 g/dL — ABNORMAL LOW (ref 13.0–17.0)
MCH: 30.1 pg (ref 26.0–34.0)
MCHC: 31.9 g/dL (ref 30.0–36.0)
MCV: 94.3 fL (ref 80.0–100.0)
Platelets: 304 10*3/uL (ref 150–400)
RBC: 2.46 MIL/uL — ABNORMAL LOW (ref 4.22–5.81)
RDW: 14.9 % (ref 11.5–15.5)
WBC: 11.4 10*3/uL — ABNORMAL HIGH (ref 4.0–10.5)
nRBC: 0.8 % — ABNORMAL HIGH (ref 0.0–0.2)

## 2022-10-24 LAB — BASIC METABOLIC PANEL
Anion gap: 7 (ref 5–15)
BUN: 34 mg/dL — ABNORMAL HIGH (ref 8–23)
CO2: 25 mmol/L (ref 22–32)
Calcium: 8.8 mg/dL — ABNORMAL LOW (ref 8.9–10.3)
Chloride: 102 mmol/L (ref 98–111)
Creatinine, Ser: 1.12 mg/dL (ref 0.61–1.24)
GFR, Estimated: >60 mL/min (ref >60)
Glucose, Bld: 136 mg/dL — ABNORMAL HIGH (ref 70–99)
Potassium: 4.3 mmol/L (ref 3.5–5.1)
Sodium: 134 mmol/L — ABNORMAL LOW (ref 135–145)

## 2022-10-24 LAB — GLUCOSE, CAPILLARY
Glucose-Capillary: 131 mg/dL — ABNORMAL HIGH (ref 70–99)
Glucose-Capillary: 168 mg/dL — ABNORMAL HIGH (ref 70–99)
Glucose-Capillary: 139 mg/dL — ABNORMAL HIGH (ref 70–99)
Glucose-Capillary: 157 mg/dL — ABNORMAL HIGH (ref 70–99)

## 2022-10-24 LAB — HEPARIN LEVEL (UNFRACTIONATED): Heparin Unfractionated: 0.47 IU/mL (ref 0.30–0.70)

## 2022-10-24 MED ORDER — APIXABAN 5 MG PO TABS
5.0000 mg | ORAL_TABLET | Freq: Two times a day (BID) | ORAL | Status: DC
  Administered 2022-10-24 – 2022-10-27 (×7): 5 mg via ORAL
  Filled 2022-10-24 (×7): qty 1

## 2022-10-24 NOTE — Progress Notes (Signed)
Occupational Therapy Treatment Patient Details Name: Ralph Blackwell MRN: [id number] DOB: 1940-10-18 Today's Date: 10/24/2022   History of present illness 82 year old male that underwent right transradial cath with cardiology on 10/13/22. Ultimately was found to have PEs and underwent thrombolysis and then developed right radial pseudoaneurysm with hematoma and compartment syndrome.  He underwent evacuation of the hematoma of his forearm with fasciotomies including carpal tunnel release and repair of the pseudoaneurysm. Underwent wash out and vac change 10/17/22. PMH: CAD, HTN, PE, OSA, renal insufficiency, GERD, Gout, Hiatal Hernia.   OT comments  Pt continuing to progress towards pt focused goals. Educated patient on additional AROM exercises to maintain joint integrity for shoulder and elbow. Educated patient on new exercises to promote hand/finger AROM, Pt demonstrated understanding and complete repetitions. Pt ambulated ~580ft and HR was at 126 max, Pt making good progression in acute setting. Pt anticipates to DC soon with HHOT and will start outpatient on Monday for hand therapy. DC plans remain appropriate at this time.    Recommendations for follow up therapy are one component of a multi-disciplinary discharge planning process, led by the attending physician.  Recommendations may be updated based on patient status, additional functional criteria and insurance authorization.    Assistance Recommended at Discharge Intermittent Supervision/Assistance  Patient can return home with the following  A little help with bathing/dressing/bathroom;Assistance with cooking/housework;Assist for transportation   Equipment Recommendations  Tub/shower seat    Recommendations for Other Services      Precautions / Restrictions Precautions Precautions: Fall Precaution Comments: R arm wound vac and JP drain, significant amount of lines, watch HR Restrictions Weight Bearing Restrictions: No Other  Position/Activity Restrictions: Keep RUE elevated when resting       Mobility Bed Mobility Overal bed mobility: Needs Assistance Bed Mobility: Supine to Sit     Supine to sit: Supervision Sit to supine: Min guard        Transfers Overall transfer level: Needs assistance Equipment used:  (+ Platform walker) Transfers: Sit to/from Stand Sit to Stand: Supervision           General transfer comment: Pt needed assist to lock platform walker before standing for safety.     Balance Overall balance assessment: Modified Independent                                         ADL either performed or assessed with clinical judgement   ADL Overall ADL's : Needs assistance/impaired                 Upper Body Dressing : Sitting;Moderate assistance Upper Body Dressing Details (indicate cue type and reason): Donned gown in jacket like fashion                 Functional mobility during ADLs: Supervision/safety (+ Platform walker)      Extremity/Trunk Assessment              Vision       Perception     Praxis      Cognition Arousal/Alertness: Awake/alert Behavior During Therapy: WFL for tasks assessed/performed Overall Cognitive Status: Within Functional Limits for tasks assessed                                          Exercises  Other Exercises Other Exercises: AROM Elbow flex/ext RUE x5 reps Other Exercises: Towel squeezes for finger adduction x10 reps Other Exercises: AROM shoulder circles x10 reps Other Exercises: finger walks (radial and ulnar) x3 each    Shoulder Instructions       General Comments Pt HR up to 126 max with functional ambulation, Sp02 on 2L but reading inaccurate waveforms unable to fully assess 02 levels    Pertinent Vitals/ Pain       Pain Assessment Pain Assessment: No/denies pain  Home Living                                          Prior Functioning/Environment               Frequency  Min 5X/week        Progress Toward Goals  OT Goals(current goals can now be found in the care plan section)  Progress towards OT goals: Progressing toward goals  Acute Rehab OT Goals Patient Stated Goal: To get better; hand to get better OT Goal Formulation: With patient Time For Goal Achievement: 10/31/22 Potential to Achieve Goals: Good  Plan Frequency remains appropriate;Discharge plan remains appropriate    Co-evaluation                 AM-PAC OT "6 Clicks" Daily Activity     Outcome Measure   Help from another person eating meals?: A Little Help from another person taking care of personal grooming?: A Little Help from another person toileting, which includes using toliet, bedpan, or urinal?: A Little Help from another person bathing (including washing, rinsing, drying)?: A Lot Help from another person to put on and taking off regular upper body clothing?: A Lot Help from another person to put on and taking off regular lower body clothing?: A Lot 6 Click Score: 15    End of Session Equipment Utilized During Treatment: Gait belt;Other (comment) (Platform walker)  OT Visit Diagnosis: Muscle weakness (generalized) (M62.81);Pain Pain - Right/Left: Right Pain - part of body: Arm   Activity Tolerance Patient tolerated treatment well   Patient Left in bed;with call bell/phone within reach   Nurse Communication Mobility status;Other (comment) (watch HR)        Time: 6222-9798 OT Time Calculation (min): 45 min  Charges: OT Treatments $Therapeutic Activity: 23-37 mins $Therapeutic Exercise: 8-22 mins  10/24/2022  AB, OTR/L  Acute Rehabilitation Services  Office: [phone]   Tristan Schroeder 10/24/2022, 5:45 PM

## 2022-10-24 NOTE — Progress Notes (Signed)
    Durable Medical Equipment  (From admission, onward)           Start     Ordered   10/24/22 1354  For home use only DME Hospital bed  Once       Question Answer Comment  Length of Need 6 Months   The above medical condition requires: Patient requires the ability to reposition frequently   Head must be elevated greater than: 30 degrees   Bed type Semi-electric      10/24/22 1353   10/23/22 1529  For home use only DME Negative pressure wound device  Once       Question Answer Comment  Frequency of dressing change 3 times per week   Length of need 6 Months   Dressing type Foam   Amount of suction 120 mm/Hg   Pressure application Continuous pressure   Supplies 10 canisters and 15 dressings per month for duration of therapy      10/23/22 1530            Patient has medical condition which requires positioning of body in ways not feasible with an ordinary bed.  Patient requires positioning of body in a way not feasible with an ordinary bed in order to alleviate pain. Patient requires frequent changes in body position and has an immediate need for a change in body position.

## 2022-10-24 NOTE — Progress Notes (Signed)
Patient seen at the bedside today. He is working with physical therapy with exercises for his right arm/hand. Wound vac to right arm in place. Patient in good spirits and states he feels well other than his right arm. Patient still requiring oxygen. Patient with possible plans for discharge home this weekend if medical supplies available (bed, wound vac, oxygen, etc.).   Please call IR with any questions.  Alwyn Ren, [place] [phone] 10/24/2022, 4:56 PM

## 2022-10-24 NOTE — Discharge Instructions (Signed)
Information on my medicine - ELIQUIS (apixaban)  Why was Eliquis prescribed for you? Eliquis was prescribed to treat blood clots that may have been found in the veins of your legs (deep vein thrombosis) or in your lungs (pulmonary embolism) and to reduce the risk of them occurring again.  What do You need to know about Eliquis ? The dose one 5 mg tablet taken TWICE daily.  Eliquis may be taken with or without food.   Try to take the dose about the same time in the morning and in the evening. If you have difficulty swallowing the tablet whole please discuss with your pharmacist how to take the medication safely.  Take Eliquis exactly as prescribed and DO NOT stop taking Eliquis without talking to the doctor who prescribed the medication.  Stopping may increase your risk of developing a new blood clot.  Refill your prescription before you run out.  After discharge, you should have regular check-up appointments with your healthcare provider that is prescribing your Eliquis.    What do you do if you miss a dose? If a dose of ELIQUIS is not taken at the scheduled time, take it as soon as possible on the same day and twice-daily administration should be resumed. The dose should not be doubled to make up for a missed dose.  Important Safety Information A possible side effect of Eliquis is bleeding. You should call your healthcare provider right away if you experience any of the following: Bleeding from an injury or your nose that does not stop. Unusual colored urine (red or dark brown) or unusual colored stools (red or black). Unusual bruising for unknown reasons. A serious fall or if you hit your head (even if there is no bleeding).  Some medicines may interact with Eliquis and might increase your risk of bleeding or clotting while on Eliquis. To help avoid this, consult your healthcare provider or pharmacist prior to using any new prescription or non-prescription medications, including  herbals, vitamins, non-steroidal anti-inflammatory drugs (NSAIDs) and supplements.  This website has more information on Eliquis (apixaban): http://[email or web address].com/eliquis/home

## 2022-10-24 NOTE — Progress Notes (Addendum)
  Progress Note    10/24/2022 6:47 AM 2 Days Post-Op  Subjective:  says his hand is feeling a little better every day.    Tm 89 now afebrile  Vitals:   10/23/22 2314 10/24/22 0340  BP: 130/64 115/60  Pulse: 93 82  Resp: 17 12  Temp: 99 F (37.2 C) 98.3 F (36.8 C)  SpO2: 99% 100%    Physical Exam: General:  no distress Cardiac:  regular Lungs:  non labored Incisions:  wound vac in place with good seal Extremities:  much less swelling in fingers this morning since I last saw him.  Easily palpable right radial pulse.    CBC    Component Value Date/Time   WBC 11.4 (H) 10/24/2022 0202   RBC 2.46 (L) 10/24/2022 0202   HGB 7.4 (L) 10/24/2022 0202   HCT 23.2 (L) 10/24/2022 0202   PLT 304 10/24/2022 0202   MCV 94.3 10/24/2022 0202   MCH 30.1 10/24/2022 0202   MCHC 31.9 10/24/2022 0202   RDW 14.9 10/24/2022 0202   LYMPHSABS 1.4 10/23/2022 0128   MONOABS 0.9 10/23/2022 0128   EOSABS 0.1 10/23/2022 0128   BASOSABS 0.0 10/23/2022 0128    BMET    Component Value Date/Time   NA 134 (L) 10/24/2022 0202   NA 142 02/21/2021 1014   K 4.3 10/24/2022 0202   CL 102 10/24/2022 0202   CO2 25 10/24/2022 0202   GLUCOSE 136 (H) 10/24/2022 0202   BUN 34 (H) 10/24/2022 0202   BUN 20 02/21/2021 1014   CREATININE 1.12 10/24/2022 0202   CALCIUM 8.8 (L) 10/24/2022 0202   GFRNONAA >60 10/24/2022 0202   GFRAA 78 (L) 01/25/2013 1608    INR    Component Value Date/Time   INR 1.3 (H) 10/15/2022 2120     Intake/Output Summary (Last 24 hours) at 10/24/2022 0647 Last data filed at 10/24/2022 0602 Gross per 24 hour  Intake 365.94 ml  Output 1015 ml  Net -649.06 ml      Assessment/Plan:  82 y.o. male is s/p:  I&D  and washout of right arm fasciotomy 2 Days Post-Op   -pt with easily palpable right radial pulse.  Swelling in fingers much improved.  He is doing a good job elevating his arm. -wound vac has good seal and to stay in place 7-10 days.   -JP drain with 15cc out  last 24 hours.  Removal per Dr. Chestine Spore.  -DVT prophylaxis:  heparin gtt   Doreatha Massed, PA-C [facility] [phone] 10/24/2022 6:47 AM   I have seen and evaluated the patient. I agree with the PA note as documented above.  Discussed with hospitalist can transition to DOAC.  Home VAC ordered yesterday by our service.  Next VAC change would be in the office in about 8 to 10 days postop.  Swelling much improved.  VAC in place with good seal.  Will leave drain for now and follow this as he is not going home today according to the hospitalist.  Cephus Shelling, MD [facility]: [phone]

## 2022-10-24 NOTE — Progress Notes (Signed)
Physical Therapy Treatment Patient Details Name: Ralph Blackwell MRN: [id number] DOB: 13-Jun-1941 Today's Date: 10/24/2022   History of Present Illness 82 year old male that underwent right transradial cath with cardiology on 10/13/22. Ultimately was found to have PEs and underwent thrombolysis and then developed right radial pseudoaneurysm with hematoma and compartment syndrome.  He underwent evacuation of the hematoma of his forearm with fasciotomies including carpal tunnel release and repair of the pseudoaneurysm. Underwent wash out and vac change 10/17/22. PMH: CAD, HTN, PE, OSA, renal insufficiency, GERD, Gout, Hiatal Hernia.    PT Comments    Pt limited in safe mobility by increased HR with mobility. Pt's resting HR in 80-90s, however when he begins to mobilize his increases to 130-140s with mobilization. MD made aware of tachycardia, as plan for discharge over this weekend. Pt would benefit from hospital bed and standing/upright Rollator for improved mobility given inability to use R UE. Pt will not have any additional PT needs at discharge.    Recommendations for follow up therapy are one component of a multi-disciplinary discharge planning process, led by the attending physician.  Recommendations may be updated based on patient status, additional functional criteria and insurance authorization.     Assistance Recommended at Discharge Frequent or constant Supervision/Assistance  Patient can return home with the following A little help with bathing/dressing/bathroom;Assistance with cooking/housework;Assist for transportation   Equipment Recommendations  Other (comment);Hospital bed (upright Rollator)       Precautions / Restrictions Precautions Precautions: Fall Precaution Comments: R arm wound vac and JP drain, significant amount of lines, watch HR Restrictions Weight Bearing Restrictions: No Other Position/Activity Restrictions: Keep RUE elevated when resting     Mobility  Bed  Mobility Overal bed mobility: Needs Assistance Bed Mobility: Supine to Sit     Supine to sit: HOB elevated, Min guard     General bed mobility comments: min guard for safety, as pt uses increased momentum to come to seated EoB    Transfers Overall transfer level: Needs assistance Equipment used:  Paediatric nurse) Transfers: Sit to/from Stand Sit to Stand: Min guard           General transfer comment: from EOB<>upright Rollator, pt pushing from lower walker handles with LUE and good awareness of safety/lines while placing RUE on cushioned forearm rest.    Ambulation/Gait Ambulation/Gait assistance: Min guard Gait Distance (Feet): 150 Feet (3x standing rest break due to HR in 140s.) Assistive device: Standup Rollator Gait Pattern/deviations: Step-through pattern, Decreased step length - right, Decreased step length - left   Gait velocity interpretation: >2.62 ft/sec, indicative of community ambulatory   General Gait Details: strong steady gait, with rest breaks when HR in 140s,       Balance Overall balance assessment: Modified Independent                                          Cognition Arousal/Alertness: Awake/alert Behavior During Therapy: WFL for tasks assessed/performed Overall Cognitive Status: Within Functional Limits for tasks assessed                                             General Comments General comments (skin integrity, edema, etc.): Pt continues to have significant increase in HR with mobility, MD notified. SPO2 on 2L O2  via [place] >91%O2 with ambulation, H      Pertinent Vitals/Pain Pain Assessment Pain Assessment: No/denies pain     PT Goals (current goals can now be found in the care plan section) Acute Rehab PT Goals Patient Stated Goal: get back to walking with my dog and yard work PT Goal Formulation: With patient/family Time For Goal Achievement: 10/31/22 Potential to Achieve Goals: Good Progress  towards PT goals: Progressing toward goals    Frequency    Min 1X/week      PT Plan Equipment recommendations need to be updated       AM-PAC PT "6 Clicks" Mobility   Outcome Measure  Help needed turning from your back to your side while in a flat bed without using bedrails?: None Help needed moving from lying on your back to sitting on the side of a flat bed without using bedrails?: A Little Help needed moving to and from a bed to a chair (including a wheelchair)?: A Little Help needed standing up from a chair using your arms (e.g., wheelchair or bedside chair)?: A Little Help needed to walk in hospital room?: A Little Help needed climbing 3-5 steps with a railing? : A Little 6 Click Score: 19    End of Session Equipment Utilized During Treatment: Gait belt;Oxygen Activity Tolerance: Treatment limited secondary to medical complications (Comment);Other (comment) (tachycardia) Patient left: with call bell/phone within reach;Other (comment);in chair (RUE elevated on tray table) Nurse Communication: Mobility status (tachy with exertion) PT Visit Diagnosis: Difficulty in walking, not elsewhere classified (R26.2)     Time: 5643-3295 PT Time Calculation (min) (ACUTE ONLY): 32 min  Charges:  $Gait Training: 8-22 mins $Therapeutic Activity: 8-22 mins                     Ayda Tancredi B. Beverely Risen PT, DPT Acute Rehabilitation Services Please use secure chat or  Call Office [phone]    [facility] 10/24/2022, 12:53 PM

## 2022-10-24 NOTE — TOC Progression Note (Signed)
Transition of Care [facility]) - Progression Note    Patient Details  Name: SPIROS RUTIGLIANO MRN: [id number] Date of Birth: 06-17-1941  Transition of Care [facility]) CM/SW Contact  Kermit Balo, RN Phone Number: 10/24/2022, 2:04 PM  Clinical Narrative:    Hospital bed ordered through [facility].   Expected Discharge Plan: Home w Home Health Services Barriers to Discharge: Continued Medical Work up  Expected Discharge Plan and Services     Post Acute Care Choice: Home Health, Durable Medical Equipment Living arrangements for the past 2 months: Single Family Home                 DME Arranged: Hospital bed DME Agency: [employer] Date DME Agency Contacted: 10/24/22     HH Arranged: RN, PT, OT HH Agency: [facility] Date HH Agency Contacted: 10/24/22 Time HH Agency Contacted: 1230 Representative spoke with at [facility] Agency: Bjorn Loser   Social Determinants of Health (SDOH) Interventions SDOH Screenings   Food Insecurity: No Food Insecurity (10/13/2022)  Housing: Low Risk  (10/13/2022)  Transportation Needs: No Transportation Needs (10/13/2022)  Utilities: Not At Risk (10/13/2022)  Tobacco Use: Low Risk  (10/22/2022)    Readmission Risk Interventions     No data to display

## 2022-10-24 NOTE — TOC Progression Note (Addendum)
Transition of Care (TOC) - Progression Note  Donn Pierini RN, BSN Transitions of Care Unit 4E- RN Case Manager See Treatment Team for direct phone #   Patient Details  Name: Ralph Blackwell MRN: [id number] Date of Birth: 08/08/1940  Transition of Care [facility]) CM/SW Contact  Zenda Alpers, Lenn Sink, RN Phone Number: 10/24/2022, 1:38 PM  Clinical Narrative:    Referral received for home wound VAC needs, [employer] home VAC order form has been signed by [facility] and faxed to [phone] for insurance approval.   [facility] notified CM that they are following for [facility] needs and CM spoke with liaison to confirm they have staffing for HHPT/OT- per MD note pt will not need home VAC drsg changes at first- plan is for pt to have first drsg change in the [facility] office 8-10 days out from surgery.  HH has been confirmed with [facility] and they will contact pt post discharge.   CM spoke with pt and wife at bedside- they are asking for hospital bed and rollator that has platforms (elevated rollator) in house provider does not have this type of rollator and wife voiced she will order off of [employer]. Pt voiced he uses [employer] for his cpap supplies and is ok with using them for the hospital bed- once order is placed and PT documentation in- CM will call [employer] for DME- hospital bed need. Wife needs time to make room for bed- but tentative plan is for d/c tomorrow.   Home address confirmed as: [address] [place] [address] PCPDeboraha Sprang  TOC will continue to follow for [facility] needs and will deliver home VAC to room once approved.    Expected Discharge Plan: Home w Home Health Services Barriers to Discharge: Continued Medical Work up  Expected Discharge Plan and Services     Post Acute Care Choice: Home Health, Durable Medical Equipment Living arrangements for the past 2 months: Single Family Home                 DME Arranged: Hospital bed DME Agency: [facility] Date DME Agency Contacted: 10/24/22     HH Arranged:  RN, PT, OT HH Agency: [facility] Date HH Agency Contacted: 10/24/22 Time HH Agency Contacted: 1230 Representative spoke with at [facility] Agency: Bjorn Loser   Social Determinants of Health (SDOH) Interventions SDOH Screenings   Food Insecurity: No Food Insecurity (10/13/2022)  Housing: Low Risk  (10/13/2022)  Transportation Needs: No Transportation Needs (10/13/2022)  Utilities: Not At Risk (10/13/2022)  Tobacco Use: Low Risk  (10/22/2022)    Readmission Risk Interventions     No data to display

## 2022-10-24 NOTE — Progress Notes (Addendum)
ANTICOAGULATION CONSULT NOTE - Follow Up Consult  Pharmacy Consult for Heparin Indication: pulmonary embolus  No Known Allergies  Patient Measurements: Height: 5' 9.5" (176.5 cm) Weight: 91.1 kg (200 lb 13.4 oz) IBW/kg (Calculated) : 71.85 Heparin Dosing Weight: 90 kg  Vital Signs: Temp: 98.9 F (37.2 C) (04/12 0812) Temp Source: Oral (04/12 0812) BP: 118/67 (04/12 0812) Pulse Rate: 95 (04/12 0812)  Labs: Recent Labs    10/22/22 0718 10/22/22 1917 10/23/22 0128 10/23/22 1158 10/24/22 0202  HGB 7.5*  --  8.1*  --  7.4*  HCT 22.9*  --  25.3*  --  23.2*  PLT 229  --  253  --  304  HEPARINUNFRC 0.43 0.35  --  0.30 0.47  CREATININE 1.08  --  1.11  --  1.12     Estimated Creatinine Clearance: 60.2 mL/min (by C-G formula based on SCr of 1.12 mg/dL).  Assessment: 12 yoM admitted with CP with clean cath. CT chest showed PE. Pt started on IV heparin. IR placed bilateral PE lysis catheters 4/2 x12h and heparin continued. Pt with compartment syndrome and taken to OR for fasciotomy and evacuation of hematoma on 4/3, then washout on 4/5 and 4/8. He had washout, I&D, and vac change 4/10  -Heparin level is therapeutic (0.47) on 1400 units/hr. -hg= 7.4  Plans to transition to apixaban today.  He has been on heparin since 3/31 and s/p procedure with hg= 7.4. Discussed with Dr. Jacqulyn Bath and not use an apixaban load -apixaban cost: $47 per month  Goal of Therapy:  Heparin level 0.3-0.5 units/ml Monitor platelets by anticoagulation protocol: Yes   Plan:  -no apixaban load due to low Hg and recent procedures -discontinue heparin  -Apixaban 5mg  po bid   Thank you for involving pharmacy in this patient's care.  Harland German, PharmD Clinical Pharmacist **Pharmacist phone directory can now be found on [email or web address] ([email or web address]).  Listed under [facility].

## 2022-10-24 NOTE — Progress Notes (Signed)
PROGRESS NOTE    Ralph Blackwell  IDP:[id number] DOB: 1940-10-05 DOA: 10/11/2022 PCP: Joya Martyr, MD (Inactive)   Brief Narrative:  Ralph Blackwell is a 82 year old gentleman who presented emergency department with chest pain, shortness of breath. He was given nitroglycerin, He was also eventually placed on Nitropaste.  Due to concern of unstable angina, he was started on heparin drip.  Cardiology consulted, he underwent cardiac cath on 10/13/2022 with clean coronaries.  Of note, they traveled approximately 3 weeks ago to the [place].  Due to him having past history of PE, this raised concern for possible PE.  CTA was obtained after cath on 10/13/2022 and he was diagnosed to have saddle PE.  IR and PCCM was consulted and he was transferred to ICU and underwent catheter-directed TPA/lysis with IR 4/2. Initially tolerated well, but late 4/2PM-4/3AM developed severe pain, bruising and swelling of RUE with concern for compartment syndrome of R forearm. Underwent fasciotomy with [facility] 4/3 where source was prior arteriotomy site from cath; [facility] placed. Now s/p second washout/hematoma evacuation/WV replacement with VVS. Remains on heparin gtt for resolving PE. Hemodynamically stable for several days.  Transferred under TRH on 10/22/2022.  Assessment & Plan:   Principal Problem:   Unstable angina Active Problems:   Essential hypertension   Mixed hyperlipidemia   Thrombocytopenia   Atypical angina   Acute pulmonary embolism with acute cor pulmonale   Hematoma of forearm   Compartment syndrome of right hand   Thrombocytopenia, unspecified   PAF (paroxysmal atrial fibrillation)  Chest pain: Underwent cardiac cath, was found to have clean coronaries.  Was then found to have saddle PE.  Submassive saddle PE s/p catheter directed lytics: Currently hemodynamically stable and remains on heparin drip.  Will need outpatient echo to reevaluate RV function posttreatment.  Will require minimum of 3 months of  anticoagulation.  Clear by vascular surgery to transition to DOAC, started on Eliquis today.  Compartment syndrome of the right forearm: hand swelling and hematoma from bleeding into the forearm from previous catheter access site. Unfortunately this is an iatrogenic complication of tPA administration for pulmonary embolism.  Radial artery pseudoaneurysm, s/p repair.Underwent fasciotomy with [facility] 4/3 where source was prior arteriotomy site from cath; [facility] placed. Now s/p second washout/hematoma evacuation/WV replacement with VVS. Remains on heparin gtt for resolving PE.  Underwent another washout of the right forearm fasciotomy and excisional debridement of muscle right forearm and VAC change.  Per vascular surgery, next VAC change will be in 8 to 10 days as outpatient.   His right upper extremity still ecchymotic but improved compared to yesterday.  Hand function is improving as well.  Discussed with Dr. Chestine Spore in person, he told me that patient is cleared for discharge and all we are waiting at this point in time is for him to receive portable wound VAC.  Patient is medically stable and will be discharged once all DME's are arranged.  Acute blood loss anemia due to bleeding into forearm: Patient received total of 2 units of PRBC transfusion on 10/14/2022 and 10/15/2022.  Hemoglobin slightly low at 7.4 today.  Repeat labs in the morning and transfuse if less than 7.  Thrombocytopenia, chronic: Improved  Mixed hyperlipidemia: Continue statin.  Essential hypertension: Controlled.  Continue Avapro and Toprol-XL.  GERD: Continue PPI.  DVT prophylaxis: SCD's Start: 10/13/22 1110 heparin   Code Status: Full Code  Family Communication: None present at bedside.  Plan of care discussed with patient in length and he/she verbalized understanding  and agreed with it.  Status is: Inpatient Remains inpatient appropriate because: Medically stable and cleared for discharge.  Pending DME arrangements.  Estimated body  mass index is 29.23 kg/m as calculated from the following:   Height as of this encounter: 5' 9.5" (1.765 m).   Weight as of this encounter: 91.1 kg.    Nutritional Assessment: Body mass index is 29.23 kg/m.Marland Kitchen Seen by dietician.  I agree with the assessment and plan as outlined below: Nutrition Status:        . Skin Assessment: I have examined the patient's skin and I agree with the wound assessment as performed by the wound care RN as outlined below:    Consultants:  Cardiology, vascular surgery, orthopedics  Procedures:  As above  Antimicrobials:  Anti-infectives (From admission, onward)    Start     Dose/Rate Route Frequency Ordered Stop   10/20/22 1107  ceFAZolin (ANCEF) 2-4 GM/100ML-% IVPB       Note to Pharmacy: [place] L: cabinet override      10/20/22 1107 10/20/22 1115   10/17/22 0600  ceFAZolin (ANCEF) IVPB 2g/100 mL premix       Note to Pharmacy: Send with pt to OR   2 g 200 mL/hr over 30 Minutes Intravenous To Short Stay 10/16/22 0738 10/18/22 0600         Subjective: Patient seen and examined this morning, doing well.  Pain is controlled.  Objective: Vitals:   10/23/22 2314 10/24/22 0340 10/24/22 0812 10/24/22 1136  BP: 130/64 115/60 118/67 136/69  Pulse: 93 82 95 100  Resp: Temp: 99 F (37.2 C) 98.3 F (36.8 C) 98.9 F (37.2 C) 97.7 F (36.5 C)  TempSrc: Oral Oral Oral Oral  SpO2: 99% 100% 100% 100%  Weight:      Height:        Intake/Output Summary (Last 24 hours) at 10/24/2022 1420 Last data filed at 10/24/2022 0602 Gross per 24 hour  Intake 365.94 ml  Output 1015 ml  Net -649.06 ml    Filed Weights   10/15/22 1417 10/18/22 0500 10/23/22 0534  Weight: 93 kg 92.4 kg 91.1 kg    Examination: General exam: Appears calm and comfortable  Respiratory system: Clear to auscultation. Respiratory effort normal. Cardiovascular system: S1 & S2 heard, RRR. No JVD, murmurs, rubs, gallops or clicks. No pedal  edema. Gastrointestinal system: Abdomen is nondistended, soft and nontender. No organomegaly or masses felt. Normal bowel sounds heard. Central nervous system: Alert and oriented. No focal neurological deficits. Extremities: Ecchymotic right upper extremity. Psychiatry: Judgement and insight appear normal. Mood & affect appropriate.   Data Reviewed: I have personally reviewed following labs and imaging studies  CBC: Recent Labs  Lab 10/20/22 0151 10/21/22 0335 10/22/22 0718 10/23/22 0128 10/24/22 0202  WBC 12.6* 12.4* 11.1* 11.4* 11.4*  NEUTROABS  --   --   --  8.9*  --   HGB 7.9* 7.6* 7.5* 8.1* 7.4*  HCT 24.4* 23.5* 22.9* 25.3* 23.2*  MCV 93.5 93.6 92.7 93.7 94.3  PLT 179 202 229 253 304    Basic Metabolic Panel: Recent Labs  Lab 10/18/22 0209 10/18/22 0950 10/20/22 0151 10/21/22 0335 10/22/22 0718 10/23/22 0128 10/24/22 0202  NA 135  --  135 133* 134* 133* 134*  K 5.5*   < > 4.2 4.7 4.4 4.9 4.3  CL 99  --  105 101 101 99 102  CO2 26  --  25  GLUCOSE 148*  --  143* 134* 126* 126* 136*  BUN 36*  --  30* 28* 31* 31* 34*  CREATININE 1.09  --  1.09 1.05 1.08 1.11 1.12  CALCIUM 9.0  --  8.6* 8.5* 8.6* 8.7* 8.8*  MG 2.2  --   --  2.2 2.3  --   --   PHOS 3.6  --   --   --   --   --   --    < > = values in this interval not displayed.    GFR: Estimated Creatinine Clearance: 60.2 mL/min (by C-G formula based on SCr of 1.12 mg/dL). Liver Function Tests: No results for input(s): "AST", "ALT", "ALKPHOS", "BILITOT", "PROT", "ALBUMIN" in the last 168 hours.  No results for input(s): "LIPASE", "AMYLASE" in the last 168 hours. No results for input(s): "AMMONIA" in the last 168 hours. Coagulation Profile: No results for input(s): "INR", "PROTIME" in the last 168 hours.  Cardiac Enzymes: Recent Labs  Lab 10/18/22 0209  CKTOTAL 353    BNP (last 3 results) No results for input(s): "PROBNP" in the last 8760 hours. HbA1C: No results for input(s): "HGBA1C" in  the last 72 hours. CBG: Recent Labs  Lab 10/23/22 1202 10/23/22 1701 10/23/22 2129 10/24/22 0600 10/24/22 1130  GLUCAP 118* 158* 169* 131* 168*    Lipid Profile: No results for input(s): "CHOL", "HDL", "LDLCALC", "TRIG", "CHOLHDL", "LDLDIRECT" in the last 72 hours. Thyroid Function Tests: No results for input(s): "TSH", "T4TOTAL", "FREET4", "T3FREE", "THYROIDAB" in the last 72 hours. Anemia Panel: No results for input(s): "VITAMINB12", "FOLATE", "FERRITIN", "TIBC", "IRON", "RETICCTPCT" in the last 72 hours. Sepsis Labs: No results for input(s): "PROCALCITON", "LATICACIDVEN" in the last 168 hours.  No results found for this or any previous visit (from the past 240 hour(s)).    Radiology Studies: No results found.  Scheduled Meds:  allopurinol  150 mg Oral Daily   apixaban  5 mg Oral BID   aspirin EC  81 mg Oral Daily   Chlorhexidine Gluconate Cloth  6 each Topical Daily   finasteride  5 mg Oral QHS   insulin aspart  1-3 Units Subcutaneous TID PC & HS   metoprolol tartrate  12.5 mg Oral BID   pantoprazole  80 mg Oral Daily   rosuvastatin  40 mg Oral Daily   sodium chloride flush  3 mL Intravenous Q12H   sodium chloride flush  3 mL Intravenous Q12H   Continuous Infusions:  sodium chloride     sodium chloride     sodium chloride Stopped (10/15/22 1235)   sodium chloride Stopped (10/15/22 1235)     LOS: 12 days   Ralph Closs, MD [facility]  10/24/2022, 2:20 PM   *Please note that this is a verbal dictation therefore any spelling or grammatical errors are due to the "Dragon Medical One" system interpretation.  Please page via Amion and do not message via secure chat for urgent patient care matters. Secure chat can be used for non urgent patient care matters.  How to contact the [facility] Attending or Consulting provider 7A - 7P or covering provider during after hours 7P -7A, for this patient?  Check the care team in [facility] and look for a) attending/consulting [facility]  provider listed and b) the [facility] team listed. Page or secure chat 7A-7P. Log into [email or web address] and use [facility] universal password to access. If you do not have the password, please contact the hospital operator. Locate the [facility] provider you are looking for  under [facility] and page to a number that you can be directly reached. If you still have difficulty reaching the provider, please page the [facility] (Director on Call) for the Hospitalists listed on amion for assistance.

## 2022-10-25 DIAGNOSIS — I200 Unstable angina: Secondary | ICD-10-CM | POA: Diagnosis not present

## 2022-10-25 LAB — CBC
HCT: 22.5 % — ABNORMAL LOW (ref 39.0–52.0)
Hemoglobin: 7.1 g/dL — ABNORMAL LOW (ref 13.0–17.0)
MCH: 29.7 pg (ref 26.0–34.0)
MCHC: 31.6 g/dL (ref 30.0–36.0)
MCV: 94.1 fL (ref 80.0–100.0)
Platelets: 311 10*3/uL (ref 150–400)
RBC: 2.39 MIL/uL — ABNORMAL LOW (ref 4.22–5.81)
RDW: 14.8 % (ref 11.5–15.5)
WBC: 9.7 10*3/uL (ref 4.0–10.5)
nRBC: 1 % — ABNORMAL HIGH (ref 0.0–0.2)

## 2022-10-25 LAB — GLUCOSE, CAPILLARY
Glucose-Capillary: 134 mg/dL — ABNORMAL HIGH (ref 70–99)
Glucose-Capillary: 213 mg/dL — ABNORMAL HIGH (ref 70–99)
Glucose-Capillary: 100 mg/dL — ABNORMAL HIGH (ref 70–99)
Glucose-Capillary: 127 mg/dL — ABNORMAL HIGH (ref 70–99)

## 2022-10-25 LAB — HEMOGLOBIN AND HEMATOCRIT, BLOOD
HCT: 25.4 % — ABNORMAL LOW (ref 39.0–52.0)
Hemoglobin: 8.1 g/dL — ABNORMAL LOW (ref 13.0–17.0)

## 2022-10-25 NOTE — Progress Notes (Signed)
PROGRESS NOTE    Ralph Blackwell  ZOX:[id number] DOB: August 26, 1940 DOA: 10/11/2022 PCP: Joya Martyr, MD (Inactive)   Brief Narrative:  Ralph Blackwell is a 82 year old gentleman who presented emergency department with chest pain, shortness of breath. He was given nitroglycerin, He was also eventually placed on Nitropaste.  Due to concern of unstable angina, he was started on heparin drip.  Cardiology consulted, he underwent cardiac cath on 10/13/2022 with clean coronaries.  Of note, they traveled approximately 3 weeks ago to the [place].  Due to him having past history of PE, this raised concern for possible PE.  CTA was obtained after cath on 10/13/2022 and he was diagnosed to have saddle PE.  IR and PCCM was consulted and he was transferred to ICU and underwent catheter-directed TPA/lysis with IR 4/2. Initially tolerated well, but late 4/2PM-4/3AM developed severe pain, bruising and swelling of RUE with concern for compartment syndrome of R forearm. Underwent fasciotomy with VVS 4/3 where source was prior arteriotomy site from cath; [facility] placed. Now s/p second washout/hematoma evacuation/WV replacement with VVS. Remains on heparin gtt for resolving PE. Hemodynamically stable for several days.  Transferred under [facility] on 10/22/2022.  Assessment & Plan:   Principal Problem:   Unstable angina Active Problems:   Essential hypertension   Mixed hyperlipidemia   Thrombocytopenia   Atypical angina   Acute pulmonary embolism with acute cor pulmonale   Hematoma of forearm   Compartment syndrome of right hand   Thrombocytopenia, unspecified   PAF (paroxysmal atrial fibrillation)  Chest pain: Underwent cardiac cath, was found to have clean coronaries.  Was then found to have saddle PE.  Submassive saddle PE s/p catheter directed lytics: Currently hemodynamically stable and remains on heparin drip.  Will need outpatient echo to reevaluate RV function posttreatment.  Will require minimum of 3 months of  anticoagulation.  Cleared by vascular surgery to transition to DOAC, started on Eliquis 10/24/2022.  Compartment syndrome of the right forearm: hand swelling and hematoma from bleeding into the forearm from previous catheter access site. Unfortunately this is an iatrogenic complication of tPA administration for pulmonary embolism.  Radial artery pseudoaneurysm, s/p repair.Underwent fasciotomy with VVS 4/3 where source was prior arteriotomy site from cath; [facility] placed. Now s/p second washout/hematoma evacuation/WV replacement with VVS. Remains on heparin gtt for resolving PE.  Underwent another washout of the right forearm fasciotomy and excisional debridement of muscle right forearm and VAC change.  Per vascular surgery, next VAC change will be in 8 to 10 days as outpatient.   His right upper extremity still ecchymotic but improved compared to yesterday.  Hand function is improving as well.  Discussed with Dr. Chestine Spore in person, he told me that patient is cleared for discharge and all we are waiting at this point in time is for him to receive portable wound VAC and hospital bed.  Patient tells me that he has been informed that hospital bed will be delivered on Monday and that he cannot go home before that.  Patient is medically stable and will be discharged once all DME's are arranged. Of note, his drain was removed 10/25/2022.  Acute blood loss anemia due to bleeding into forearm: Patient received total of 2 units of PRBC transfusion on 10/14/2022 and 10/15/2022.  Hemoglobin dropping, currently 7.1.  Rechecking at noon, transfuse if 7.1 or less.  Thrombocytopenia, chronic: Improved  Mixed hyperlipidemia: Continue statin.  Essential hypertension: Controlled.  Continue Avapro and Toprol-XL.  GERD: Continue PPI.  DVT prophylaxis: SCD's  Start: 10/13/22 1110 heparin   Code Status: Full Code  Family Communication: None present at bedside.  Plan of care discussed with patient in length and he/she verbalized  understanding and agreed with it.  Status is: Inpatient Remains inpatient appropriate because: Medically stable and cleared for discharge.  Pending DME arrangements.  Estimated body mass index is 29.23 kg/m as calculated from the following:   Height as of this encounter: 5' 9.5" (1.765 m).   Weight as of this encounter: 91.1 kg.    Nutritional Assessment: Body mass index is 29.23 kg/m.Marland Kitchen Seen by dietician.  I agree with the assessment and plan as outlined below: Nutrition Status:        . Skin Assessment: I have examined the patient's skin and I agree with the wound assessment as performed by the wound care RN as outlined below:    Consultants:  Cardiology, vascular surgery, orthopedics  Procedures:  As above  Antimicrobials:  Anti-infectives (From admission, onward)    Start     Dose/Rate Route Frequency Ordered Stop   10/20/22 1107  ceFAZolin (ANCEF) 2-4 GM/100ML-% IVPB       Note to Pharmacy: [place]y [place] L: cabinet override      10/20/22 1107 10/20/22 1115   10/17/22 0600  ceFAZolin (ANCEF) IVPB 2g/100 mL premix       Note to Pharmacy: Send with pt to OR   2 g 200 mL/hr over 30 Minutes Intravenous To Short Stay 10/16/22 0738 10/18/22 0600         Subjective: Seen and examined.  No complaints.  He is happy that his right hand function is improving gradually.  Objective: Vitals:   10/25/22 0759 10/25/22 0851 10/25/22 0946 10/25/22 0951  BP: (!) 128/59 119/66 111/62 111/62  Pulse: 99 89 85 85  Resp: 18     Temp: 98.4 F (36.9 C)     TempSrc: Oral     SpO2: 99% 100%    Weight:      Height:        Intake/Output Summary (Last 24 hours) at 10/25/2022 1020 Last data filed at 10/25/2022 0819 Gross per 24 hour  Intake 480 ml  Output 1750 ml  Net -1270 ml    Filed Weights   10/15/22 1417 10/18/22 0500 10/23/22 0534  Weight: 93 kg 92.4 kg 91.1 kg    Examination: General exam: Appears calm and comfortable  Respiratory system: Clear to  auscultation. Respiratory effort normal. Cardiovascular system: S1 & S2 heard, RRR. No JVD, murmurs, rubs, gallops or clicks. No pedal edema. Gastrointestinal system: Abdomen is nondistended, soft and nontender. No organomegaly or masses felt. Normal bowel sounds heard. Central nervous system: Alert and oriented. No focal neurological deficits. Extremities: Ecchymosis right arm.  Wound VAC attached. Psychiatry: Judgement and insight appear normal. Mood & affect appropriate.    Data Reviewed: I have personally reviewed following labs and imaging studies  CBC: Recent Labs  Lab 10/21/22 0335 10/22/22 0718 10/23/22 0128 10/24/22 0202 10/25/22 0100  WBC 12.4* 11.1* 11.4* 11.4* 9.7  NEUTROABS  --   --  8.9*  --   --   HGB 7.6* 7.5* 8.1* 7.4* 7.1*  HCT 23.5* 22.9* 25.3* 23.2* 22.5*  MCV 93.6 92.7 93.7 94.3 94.1  PLT 202 229 253 304 311    Basic Metabolic Panel: Recent Labs  Lab 10/20/22 0151 10/21/22 0335 10/22/22 0718 10/23/22 0128 10/24/22 0202  NA 135 133* 134* 133* 134*  K 4.2 4.7 4.4 4.9 4.3  CL 105  101 101 99 102  CO2 GLUCOSE 143* 134* 126* 126* 136*  BUN 30* 28* 31* 31* 34*  CREATININE 1.09 1.05 1.08 1.11 1.12  CALCIUM 8.6* 8.5* 8.6* 8.7* 8.8*  MG  --  2.2 2.3  --   --     GFR: Estimated Creatinine Clearance: 60.2 mL/min (by C-G formula based on SCr of 1.12 mg/dL). Liver Function Tests: No results for input(s): "AST", "ALT", "ALKPHOS", "BILITOT", "PROT", "ALBUMIN" in the last 168 hours.  No results for input(s): "LIPASE", "AMYLASE" in the last 168 hours. No results for input(s): "AMMONIA" in the last 168 hours. Coagulation Profile: No results for input(s): "INR", "PROTIME" in the last 168 hours.  Cardiac Enzymes: No results for input(s): "CKTOTAL", "CKMB", "CKMBINDEX", "TROPONINI" in the last 168 hours.  BNP (last 3 results) No results for input(s): "PROBNP" in the last 8760 hours. HbA1C: No results for input(s): "HGBA1C" in the last 72  hours. CBG: Recent Labs  Lab 10/24/22 0600 10/24/22 1130 10/24/22 1559 10/24/22 2106 10/25/22 0605  GLUCAP 131* 168* 139* 157* 134*    Lipid Profile: No results for input(s): "CHOL", "HDL", "LDLCALC", "TRIG", "CHOLHDL", "LDLDIRECT" in the last 72 hours. Thyroid Function Tests: No results for input(s): "TSH", "T4TOTAL", "FREET4", "T3FREE", "THYROIDAB" in the last 72 hours. Anemia Panel: No results for input(s): "VITAMINB12", "FOLATE", "FERRITIN", "TIBC", "IRON", "RETICCTPCT" in the last 72 hours. Sepsis Labs: No results for input(s): "PROCALCITON", "LATICACIDVEN" in the last 168 hours.  No results found for this or any previous visit (from the past 240 hour(s)).    Radiology Studies: No results found.  Scheduled Meds:  allopurinol  150 mg Oral Daily   apixaban  5 mg Oral BID   aspirin EC  81 mg Oral Daily   finasteride  5 mg Oral QHS   insulin aspart  1-3 Units Subcutaneous TID PC & HS   metoprolol tartrate  12.5 mg Oral BID   pantoprazole  80 mg Oral Daily   rosuvastatin  40 mg Oral Daily   sodium chloride flush  3 mL Intravenous Q12H   sodium chloride flush  3 mL Intravenous Q12H   Continuous Infusions:  sodium chloride     sodium chloride     sodium chloride Stopped (10/15/22 1235)   sodium chloride Stopped (10/15/22 1235)     LOS: 13 days   Ralph Closs, MD [facility]  10/25/2022, 10:20 AM   *Please note that this is a verbal dictation therefore any spelling or grammatical errors are due to the "Dragon [employer] One" system interpretation.  Please page via Amion and do not message via secure chat for urgent patient care matters. Secure chat can be used for non urgent patient care matters.  How to contact the [facility] Attending or Consulting provider 7A - 7P or covering provider during after hours 7P -7A, for this patient?  Check the care team in [facility] and look for a) attending/consulting [facility] provider listed and b) the [facility] team listed. Page or secure chat  7A-7P. Log into [email or web address] and use [place]'s universal password to access. If you do not have the password, please contact the hospital operator. Locate the [facility] provider you are looking for under [facility] and page to a number that you can be directly reached. If you still have difficulty reaching the provider, please page the [facility] (Director on Call) for the Hospitalists listed on amion for assistance.

## 2022-10-25 NOTE — Patient Instructions (Signed)
Complete each exercise 10-15X, 2-3X/day  Sponge Grip  Place sponge across your palm and gently squeeze all fingers down into the sponge.    Sponge Lateral Pinch  Form a fist and place the sponge block along the side of your index finger. Then press the thumb directly down into the sponge block.    THUMB COMPOSITE FLEXION/EXTENSION  Bend your thumb across your palm, getting as close to the base of your pinky finger as you can.  Then, straighten your thumb out to the side, in the "hitch-hiker" position.  1) Towel crunch Place a small towel on a firm table top. Flatten out the towel and then place your hand on one end of it.  Next, flex your fingers 2-5 (index finger through pinky finger) as you pull the towel towards your hand.    2) Digit composite flexion/adduction (make a fist) Hold your hand up as shown. Open and close your hand into a fist and repeat. If you cannot make a full fist, then make a partial fist.    3) Thumb/finger opposition Touch the tip of the thumb to each fingertip one by one. Extend fingers fully after they are touched.       5) PIP Joint Blocking Grasp the affected finger, bracing below the middle knuckle, and actively bend the finger as shown.    6) DIP Joint Blocking Grasp the affected finger, bracing below the last knuckle, and actively bend the finger at the last joint.     7) Digit Abduction/Adduction Hold hand palm down flat on table. Spread your fingers apart and back together.     ELBOW FLEXION EXTENSION  Start with your arm at your side. Bend at your elbow to raise your forearm/hand upwards as shown. Then return to starting position and repeat.    Complete 10-15 times.   overhead reach  Stand in front of mirror with good posture (core tight, shoulders back).  Bend the affected arm at the elbow.  Slowly reach upwards, bringing the hand towards the ceiling, straightening the elbow  as you reach upwards so that the arm is straight  overhead. Repeat desired amount of repetitions. Complete 10 times.

## 2022-10-25 NOTE — Progress Notes (Signed)
Occupational Therapy Treatment Patient Details Name: Ralph Blackwell MRN: [id number] DOB: May 08, 1941 Today's Date: 10/25/2022   History of present illness 82 year old male that underwent right transradial cath with cardiology on 10/13/22. Ultimately was found to have PEs and underwent thrombolysis and then developed right radial pseudoaneurysm with hematoma and compartment syndrome.  He underwent evacuation of the hematoma of his forearm with fasciotomies including carpal tunnel release and repair of the pseudoaneurysm. Underwent wash out and vac change 10/17/22. PMH: CAD, HTN, PE, OSA, renal insufficiency, GERD, Gout, Hiatal Hernia.   OT comments  Session focused on increasing ROM and establishing HEP for RUE. Wife present in room during session. Manual techniques completed to decrease edema and increase joint mobility. Manual passive stretching completed prior to A/ROM exercises. Pt able to touch his middle finger to his thumb after manual techniques for the first time. Provided HEP for shoulder, elbow, and hand including gentle hand strengthening and A/ROM exercises. Provided handout, verbal instructions, and visual demonstration. Pt verbalized and demonstrated understanding. Encourage pt to use his right hand as a stabilizer during meals as well as reaching for items such as his toothbrush or toothpaste. Pt able to hold his sherbet cup with his right hand while using spoon in his left hand!   Recommendations for follow up therapy are one component of a multi-disciplinary discharge planning process, led by the attending physician.  Recommendations may be updated based on patient status, additional functional criteria and insurance authorization.    Assistance Recommended at Discharge Intermittent Supervision/Assistance  Patient can return home with the following  A little help with bathing/dressing/bathroom;Assistance with cooking/housework;Assist for transportation   Equipment Recommendations   Tub/shower seat    Recommendations for Other Services      Precautions / Restrictions Precautions Precautions: Fall Precaution Comments: R arm wound vac and JP drain, significant amount of lines, watch HR Restrictions Weight Bearing Restrictions: No Other Position/Activity Restrictions: Keep RUE elevated when resting       Mobility Bed Mobility Overal bed mobility:  (up in recliner upon therapy arrival)     Transfers Overall transfer level:  (up in recliner upon therapy arrival)            ADL either performed or assessed with clinical judgement   ADL   Eating/Feeding: Set up;Sitting Eating/Feeding Details (indicate cue type and reason): Assist to open containers, drinks, and cut food. Pt able to use his right hand to stabilize container of sorbet while using left hand to feed himself. Grooming: Wash/dry hands;Modified independent Grooming Details (indicate cue type and reason): Used hand wipes to clean hands after eating.                          Cognition Arousal/Alertness: Awake/alert Behavior During Therapy: WFL for tasks assessed/performed Overall Cognitive Status: Within Functional Limits for tasks assessed         Exercises General Exercises - Upper Extremity Shoulder Flexion: AROM, Right, 10 reps, Seated, Limitations Shoulder Flexion Limitations: Elbow flexed for comfort during flexion then reached over head extending elbow at end of movement. Elbow Flexion: PROM, AROM, 10 reps, Seated, Right Elbow Extension: PROM, AROM, Right, 10 reps, Seated Wrist Flexion: PROM, Right, 5 reps, Seated Wrist Extension: PROM, Right, 5 reps, Seated Digit Composite Flexion: PROM, AROM, Right, 5 reps, 10 reps, Seated Composite Extension: PROM, AROM, Right, 5 reps, 10 reps, Seated Hand Exercises Forearm Supination: PROM, Right, 5 reps, Seated Forearm Pronation: PROM, Right, 5 reps, Seated Wrist  Ulnar Deviation: PROM, Right, 5 reps, Seated Wrist Radial Deviation:  PROM, Right, 5 reps, Seated Digit Composite Abduction: PROM, Right, 5 reps, Seated Digit Composite Adduction: PROM, Right, 5 reps, Seated Thumb Abduction: PROM, AROM, Right, 5 reps, Seated Thumb Adduction: PROM, AROM, Right, 5 reps, Seated Opposition: AROM, Right, 5 reps, Seated Hand Activities Pick Up, Palm, Put Down: Right, 10 reps, Seated, Other (comment) (hold pink foam block simulating cup pt extended elbow to reach and pick up block, flexed elbow to simulate drinking out of cup then extended elbow once more to set block down.) Other Exercises Other Exercises: Joint blocking: thumb DIP joint, 5X, right, seated Other Exercises: manual lymphatic drainage technique completed to right arm starting from shoulder working down to hand and back to shoulder in order to decrease swelling and increase ROM. Other Exercises: Joint mobilizations completed to right hand prior to manual stretching.            Pertinent Vitals/ Pain       Pain Assessment Pain Assessment: 0-10 Pain Score: 2  Pain Location: R forearm and hand Pain Descriptors / Indicators: Dull, Throbbing Pain Intervention(s): Monitored during session, Limited activity within patient's tolerance, Repositioned         Frequency  Min 5X/week        Progress Toward Goals  OT Goals(current goals can now be found in the care plan section)  Progress towards OT goals: Progressing toward goals     Plan Frequency remains appropriate;Discharge plan remains appropriate       AM-PAC OT "6 Clicks" Daily Activity     Outcome Measure   Help from another person eating meals?: A Little Help from another person taking care of personal grooming?: A Little Help from another person toileting, which includes using toliet, bedpan, or urinal?: A Little Help from another person bathing (including washing, rinsing, drying)?: A Lot Help from another person to put on and taking off regular upper body clothing?: A Lot Help from another  person to put on and taking off regular lower body clothing?: A Lot 6 Click Score: 15    End of Session    OT Visit Diagnosis: Muscle weakness (generalized) (M62.81);Pain Pain - Right/Left: Right Pain - part of body: Arm   Activity Tolerance Patient tolerated treatment well   Patient Left in chair;with call bell/phone within reach;with family/visitor present           Time: 9643-8381 OT Time Calculation (min): 23 min  Charges: OT General Charges $OT Visit: 1 Visit OT Treatments $Therapeutic Exercise: 8-22 mins $Massage: 8-22 mins  Ralph Blackwell, OTR/L,CBIS  Supplemental OT - [facility] and [facility] Secure Chat Preferred    Ralph Blackwell, Charisse March 10/25/2022, 12:54 PM

## 2022-10-25 NOTE — Progress Notes (Addendum)
[facility] [place]  Subjective  - Right UE with vac no new complaints   Objective (!) 128/59 99 98.4 F (36.9 C) (Oral) 18 99%  Intake/Output Summary (Last 24 hours) at 10/25/2022 0824 Last data filed at 10/25/2022 4098 Gross per 24 hour  Intake 480 ml  Output 1750 ml  Net -1270 ml    Right UE vac to good suction Drain 10 cc watery was discontinued today Increased mobility of the right UE, hand warm well perfused, Palpable radial pulse, motor grossly intact Lungs non labored breathing  Assessment/Planning: 82 y.o. male is s/p:  I&D  and washout of right arm fasciotomy 2 Days Post-Op  Drain D/C patient tolerated this well The vac will be maintained for 7-10 days F/U with our office has been arranged -DVT prophylaxis:  heparin gtt  Pending hospital bed delivery he will likely have to wait until Monday for discharge.   Stable condition from a vascular point of view HGB 7.1 asymptomatic sitting up in bed.  Primary team to address. Thx  Ralph Blackwell 10/25/2022 8:24 AM --  VASCULAR STAFF ADDENDUM: I have independently interviewed and examined the patient. I agree with the above.  Hand significantly improved compared to preop Great pulse Drain pulled  [name], MD [facility] Phone Number: [phone] 10/25/2022 8:54 AM    Laboratory Lab Results: Recent Labs    10/24/22 0202 10/25/22 0100  WBC 11.4* 9.7  HGB 7.4* 7.1*  HCT 23.2* 22.5*  PLT 304 311   BMET Recent Labs    10/23/22 0128 10/24/22 0202  NA 133* 134*  K 4.9 4.3  CL 99 102  CO2 24 25  GLUCOSE 126* 136*  BUN 31* 34*  CREATININE 1.11 1.12  CALCIUM 8.7* 8.8*    COAG Lab Results  Component Value Date   INR 1.3 (H) 10/15/2022   INR 1.3 (H) 10/15/2022   INR 1.8 (H) 01/07/2008   No results found for: "PTT"

## 2022-10-25 NOTE — Progress Notes (Signed)
Physical Therapy Treatment Patient Details Name: Ralph Blackwell MRN: [id number] DOB: 03/06/1941 Today's Date: 10/25/2022   History of Present Illness 82 year old male that underwent right transradial cath with cardiology on 10/13/22. Ultimately was found to have PEs and underwent thrombolysis and then developed right radial pseudoaneurysm with hematoma and compartment syndrome.  He underwent evacuation of the hematoma of his forearm with fasciotomies including carpal tunnel release and repair of the pseudoaneurysm. Underwent wash out and vac change 10/17/22. PMH: CAD, HTN, PE, OSA, renal insufficiency, GERD, Gout, Hiatal Hernia.    PT Comments    Pt continues to progress quickly towards goals. Pt is currently mod I to supervision for all functional activities with upright rollator. Pt spouse is present and supportive. Pt was able to perform stairs per home set up supervision. Pt significantly increased gait distance this session . Currently no recommended skilled physical therapy services on discharge from acute care hospital setting.  Pt encouraged to slowly increase gait distance and to focus on RUE at this time before pursuing further PT for gait/balance; pt was in agreement. At this time pt is safe from a physically functional stand point and cleared for home with family when medically stable from PT perspective.    Recommendations for follow up therapy are one component of a multi-disciplinary discharge planning process, led by the attending physician.  Recommendations may be updated based on patient status, additional functional criteria and insurance authorization.  Follow Up Recommendations       Assistance Recommended at Discharge PRN  Patient can return home with the following Assistance with cooking/housework;Assist for transportation   Equipment Recommendations  Other (comment);Hospital bed (upright rollator)    Recommendations for Other Services       Precautions / Restrictions  Precautions Precautions: Fall Precaution Comments: R arm wound vac, watch HR Restrictions Weight Bearing Restrictions: No Other Position/Activity Restrictions: Keep RUE elevated when resting     Mobility  Bed Mobility Overal bed mobility: Modified Independent Bed Mobility: Supine to Sit, Sit to Supine     Supine to sit: Modified independent (Device/Increase time) Sit to supine: Modified independent (Device/Increase time)   General bed mobility comments: No issues, HOB elevated slightly Patient Response: Cooperative  Transfers Overall transfer level: Modified independent Equipment used: Rollator (4 wheels) Transfers: Sit to/from Stand Sit to Stand: Modified independent (Device/Increase time)           General transfer comment: pt remembered to lock platform walker, good technique and hand placement    Ambulation/Gait Ambulation/Gait assistance: Modified independent (Device/Increase time) Gait Distance (Feet): 600 Feet Assistive device: Standup Rollator Gait Pattern/deviations: Step-through pattern, Decreased step length - right, Decreased step length - left   Gait velocity interpretation: 1.31 - 2.62 ft/sec, indicative of limited community ambulator   General Gait Details: HR remained around 120-130 throughout session, no significant gait deviations. Pt was able to perform dual tasking with talking/walking without difficulty   Stairs   Stairs assistance: Supervision Stair Management: One rail Left, Step to pattern, Forwards Number of Stairs: 2 General stair comments: No significant deviations in HR remained between 120-130. Pt was not out of breathe.       Balance Overall balance assessment: Modified Independent      Cognition Arousal/Alertness: Awake/alert Behavior During Therapy: WFL for tasks assessed/performed Overall Cognitive Status: Within Functional Limits for tasks assessed           General Comments General comments (skin integrity, edema,  etc.): pt HR up to 130 max on room  air with stairs and long distance ambulation. Spouse present throughout session.      Pertinent Vitals/Pain Pain Assessment Pain Assessment: 0-10 Pain Score: 2  Pain Location: R forearm and hand Pain Descriptors / Indicators: Dull, Throbbing Pain Intervention(s): Monitored during session     PT Goals (current goals can now be found in the care plan section) Acute Rehab PT Goals Patient Stated Goal: get back to walking with my dog and yard work PT Goal Formulation: With patient/family Time For Goal Achievement: 10/31/22 Potential to Achieve Goals: Good Progress towards PT goals: Progressing toward goals    Frequency    Min 1X/week      PT Plan Current plan remains appropriate       AM-PAC PT "6 Clicks" Mobility   Outcome Measure  Help needed turning from your back to your side while in a flat bed without using bedrails?: None Help needed moving from lying on your back to sitting on the side of a flat bed without using bedrails?: None Help needed moving to and from a bed to a chair (including a wheelchair)?: None Help needed standing up from a chair using your arms (e.g., wheelchair or bedside chair)?: None Help needed to walk in hospital room?: None Help needed climbing 3-5 steps with a railing? : A Little 6 Click Score: 23    End of Session Equipment Utilized During Treatment: Gait belt Activity Tolerance: Patient tolerated treatment well Patient left: in bed;with call bell/phone within reach;with family/visitor present Nurse Communication: Mobility status PT Visit Diagnosis: Difficulty in walking, not elsewhere classified (R26.2)     Time: 1610-9604 PT Time Calculation (min) (ACUTE ONLY): 24 min  Charges:  $Gait Training: 8-22 mins $Therapeutic Activity: 8-22 mins                    Harrel Carina, DPT, CLT  Acute Rehabilitation Services Office: [phone] (Secure chat preferred)    Claudia Desanctis 10/25/2022,  4:22 PM

## 2022-10-26 DIAGNOSIS — I200 Unstable angina: Secondary | ICD-10-CM | POA: Diagnosis not present

## 2022-10-26 LAB — GLUCOSE, CAPILLARY
Glucose-Capillary: 117 mg/dL — ABNORMAL HIGH (ref 70–99)
Glucose-Capillary: 121 mg/dL — ABNORMAL HIGH (ref 70–99)
Glucose-Capillary: 168 mg/dL — ABNORMAL HIGH (ref 70–99)
Glucose-Capillary: 122 mg/dL — ABNORMAL HIGH (ref 70–99)

## 2022-10-26 NOTE — Progress Notes (Addendum)
[facility] [place]  Subjective  - Right UE with improved motion   Objective 126/72 79 98.5 F (36.9 C) (Oral) 15 97%  Intake/Output Summary (Last 24 hours) at 10/26/2022 0810 Last data filed at 10/26/2022 0545 Gross per 24 hour  Intake 600 ml  Output 2000 ml  Net -1400 ml    Positive ecchymosis, good skin lines without edema Palpable radial pulses with improved motor Drain site without drainage dry Vac to good suction no increased drainage in canister  Assessment/Planning: 82 y.o. male is s/p:  I&D  and washout of right arm fasciotomy 3 Days Post-Op  Vac to suction Improved mobility Palpable radial pulse  DVT prophylaxis:  heparin gtt  Pending hospital bed delivery he will likely have to wait until Monday for discharge.   Stable condition from a vascular point of view    Ralph Blackwell 10/26/2022 8:10 AM --  VASCULAR STAFF ADDENDUM: I have independently interviewed and examined the patient. I agree with the above.  Excellent pulse, ROM improving   [name], MD [facility] Phone Number: [phone] 10/26/2022 8:41 AM    Laboratory Lab Results: Recent Labs    10/24/22 0202 10/25/22 0100 10/25/22 1131  WBC 11.4* 9.7  --   HGB 7.4* 7.1* 8.1*  HCT 23.2* 22.5* 25.4*  PLT 304 311  --    BMET Recent Labs    10/24/22 0202  NA 134*  K 4.3  CL 102  CO2 25  GLUCOSE 136*  BUN 34*  CREATININE 1.12  CALCIUM 8.8*    COAG Lab Results  Component Value Date   INR 1.3 (H) 10/15/2022   INR 1.3 (H) 10/15/2022   INR 1.8 (H) 01/07/2008   No results found for: "PTT"

## 2022-10-26 NOTE — Progress Notes (Addendum)
PROGRESS NOTE    Ralph Blackwell  ZOX:[id number] DOB: [birth year] DOA: 10/11/2022 PCP: Joya Martyr, MD (InaKENLEE MALERef Narrative:  Ralph Blackwell is a 82 year old gentleman who presented emergency department with chest pain, shortness of breath. He was given nitroglycerin, He was also eventually placed on Nitropaste.  Due to concern of unstable angina, he was started on heparin drip.  Cardiology consulted, he underwent cardiac cath on 10/13/2022 with clean coronaries.  Of note, they traveled approximately 3 weeks ago to the [place].  Due to him having past history of PE, this raised concern for possible PE.  CTA was obtained after cath on 10/13/2022 and he was diagnosed to have saddle PE.  IR and PCCM was consulted and he was transferred to ICU and underwent catheter-directed TPA/lysis with IR 4/2. Initially tolerated well, but late 4/2PM-4/3AM developed severe pain, bruising and swelling of RUE with concern for compartment syndrome of R forearm. Underwent fasciotomy with VVS 4/3 where source was prior arteriotomy site from cath; [facility] placed. Now s/p second washout/hematoma evacuation/WV replacement with VVS. Remains on heparin gtt for resolving PE. Hemodynamically stable for several days.  Transferred under TRH on 10/22/2022.  Assessment & Plan:   Principal Problem:   Unstable angina Active Problems:   Essential hypertension   Mixed hyperlipidemia   Thrombocytopenia   Atypical angina   Acute pulmonary embolism with acute cor pulmonale   Hematoma of forearm   Compartment syndrome of right hand   Thrombocytopenia, unspecified   PAF (paroxysmal atrial fibrillation)  Chest pain: Underwent cardiac cath, was found to have clean coronaries.  Was then found to have saddle PE.  Submassive saddle PE s/p catheter directed lytics: Currently hemodynamically stable and remains on heparin drip.  Will need outpatient echo to reevaluate RV function posttreatment.  Will require minimum of 3 months of  anticoagulation.  Cleared by vascular surgery to transition to DOAC, started on Eliquis 10/24/2022.  Compartment syndrome of the right forearm: hand swelling and hematoma from bleeding into the forearm from previous catheter access site. Unfortunately this is an iatrogenic complication of tPA administration for pulmonary embolism.  Radial artery pseudoaneurysm, s/p repair.Underwent fasciotomy with VVS 4/3 where source was prior arteriotomy site from cath; [facility] placed. Now s/p second washout/hematoma evacuation/WV replacement with VVS. Remains on heparin gtt for resolving PE.  Underwent another washout of the right forearm fasciotomy and excisional debridement of muscle right forearm and VAC change.  Per vascular surgery, next VAC change will be in 8 to 10 days as outpatient.   His right upper extremity still ecchymotic but improved compared to yesterday.  Hand function is improving as well.  Discussed with Dr. Chestine Spore in person, he told me that patient is cleared for discharge and all we are waiting at this point in time is for him to receive portable wound VAC and hospital bed.  Patient tells me that he has been informed that hospital bed will be delivered on Monday and that he cannot go home before that.  Patient is medically stable and will be discharged once all DME's are arranged. Of note, his drain was removed 10/25/2022.  Acute blood loss anemia due to bleeding into forearm: Patient received total of 2 units of PRBC transfusion on 10/14/2022 and 10/15/2022.  Hemoglobin improved to 8 yesterday.  No indication of transfusion.  Thrombocytopenia, chronic: Improved  Mixed hyperlipidemia: Continue statin.  Essential hypertension: Controlled.  Continue Avapro and Toprol-XL.  GERD: Continue PPI.  DVT prophylaxis: SCD's Start: 10/13/22 1110  heparin   Code Status: Full Code  Family Communication: None present at bedside.  Plan of care discussed with patient in length and he/she verbalized understanding and  agreed with it.  Status is: Inpatient Remains inpatient appropriate because: Medically stable and cleared for discharge.  Pending DME arrangements.  Estimated body mass index is 29.23 kg/m as calculated from the following:   Height as of this encounter: 5' 9.5" (1.765 m).   Weight as of this encounter: 91.1 kg.    Nutritional Assessment: Body mass index is 29.23 kg/m.Marland Kitchen Seen by dietician.  I agree with the assessment and plan as outlined below: Nutrition Status:        . Skin Assessment: I have examined the patient's skin and I agree with the wound assessment as performed by the wound care RN as outlined below:    Consultants:  Cardiology, vascular surgery, orthopedics  Procedures:  As above  Antimicrobials:  Anti-infectives (From admission, onward)    Start     Dose/Rate Route Frequency Ordered Stop   10/20/22 1107  ceFAZolin (ANCEF) 2-4 GM/100ML-% IVPB       Note to Pharmacy: [place] L: cabinet override      10/20/22 1107 10/20/22 1115   10/17/22 0600  ceFAZolin (ANCEF) IVPB 2g/100 mL premix       Note to Pharmacy: Send with pt to OR   2 g 200 mL/hr over 30 Minutes Intravenous To Short Stay 10/16/22 0738 10/18/22 0600         Subjective: Patient seen and examined.  He has no complaints.  He is looking forward to going home tomorrow after receiving hospital bed at home.  Objective: Vitals:   10/26/22 0352 10/26/22 0807 10/26/22 1052 10/26/22 1143  BP: 126/72 105/60 110/64 100/64  Pulse: 79 90 100 (!) 101  Resp: Temp: 98.5 F (36.9 C) 98.4 F (36.9 C)  98 F (36.7 C)  TempSrc: Oral   Oral  SpO2: 97% 97% 100% 97%  Weight:      Height:        Intake/Output Summary (Last 24 hours) at 10/26/2022 1318 Last data filed at 10/26/2022 0545 Gross per 24 hour  Intake 120 ml  Output 1400 ml  Net -1280 ml    Filed Weights   10/15/22 1417 10/18/22 0500 10/23/22 0534  Weight: 93 kg 92.4 kg 91.1 kg    Examination: General exam: Appears  calm and comfortable  Respiratory system: Clear to auscultation. Respiratory effort normal. Cardiovascular system: S1 & S2 heard, RRR. No JVD, murmurs, rubs, gallops or clicks. No pedal edema. Gastrointestinal system: Abdomen is nondistended, soft and nontender. No organomegaly or masses felt. Normal bowel sounds heard. Central nervous system: Alert and oriented. No focal neurological deficits. Extremities: Ecchymosis right arm.  Wound VAC attached. Psychiatry: Judgement and insight appear normal. Mood & affect appropriate.    Data Reviewed: I have personally reviewed following labs and imaging studies  CBC: Recent Labs  Lab 10/21/22 0335 10/22/22 0718 10/23/22 0128 10/24/22 0202 10/25/22 0100 10/25/22 1131  WBC 12.4* 11.1* 11.4* 11.4* 9.7  --   NEUTROABS  --   --  8.9*  --   --   --   HGB 7.6* 7.5* 8.1* 7.4* 7.1* 8.1*  HCT 23.5* 22.9* 25.3* 23.2* 22.5* 25.4*  MCV 93.6 92.7 93.7 94.3 94.1  --   PLT 202 229 253 304 311  --     Basic Metabolic Panel: Recent Labs  Lab 10/20/22 0151  10/21/22 0335 10/22/22 0718 10/23/22 0128 10/24/22 0202  NA 135 133* 134* 133* 134*  K 4.2 4.7 4.4 4.9 4.3  CL 105 101 101 99 102  CO2 24 25 25 24 25   GLUCOSE 143* 134* 126* 126* 136*  BUN 30* 28* 31* 31* 34*  CREATININE 1.09 1.05 1.08 1.11 1.12  CALCIUM 8.6* 8.5* 8.6* 8.7* 8.8*  MG  --  2.2 2.3  --   --     GFR: Estimated Creatinine Clearance: 60.2 mL/min (by C-G formula based on SCr of 1.12 mg/dL). Liver Function Tests: No results for input(s): "AST", "ALT", "ALKPHOS", "BILITOT", "PROT", "ALBUMIN" in the last 168 hours.  No results for input(s): "LIPASE", "AMYLASE" in the last 168 hours. No results for input(s): "AMMONIA" in the last 168 hours. Coagulation Profile: No results for input(s): "INR", "PROTIME" in the last 168 hours.  Cardiac Enzymes: No results for input(s): "CKTOTAL", "CKMB", "CKMBINDEX", "TROPONINI" in the last 168 hours.  BNP (last 3 results) No results for  input(s): "PROBNP" in the last 8760 hours. HbA1C: No results for input(s): "HGBA1C" in the last 72 hours. CBG: Recent Labs  Lab 10/25/22 1255 10/25/22 1623 10/25/22 2105 10/26/22 0604 10/26/22 1137  GLUCAP 213* 100* 127* 117* 121*    Lipid Profile: No results for input(s): "CHOL", "HDL", "LDLCALC", "TRIG", "CHOLHDL", "LDLDIRECT" in the last 72 hours. Thyroid Function Tests: No results for input(s): "TSH", "T4TOTAL", "FREET4", "T3FREE", "THYROIDAB" in the last 72 hours. Anemia Panel: No results for input(s): "VITAMINB12", "FOLATE", "FERRITIN", "TIBC", "IRON", "RETICCTPCT" in the last 72 hours. Sepsis Labs: No results for input(s): "PROCALCITON", "LATICACIDVEN" in the last 168 hours.  No results found for this or any previous visit (from the past 240 hour(s)).    Radiology Studies: No results found.  Scheduled Meds:  allopurinol  150 mg Oral Daily   apixaban  5 mg Oral BID   aspirin EC  81 mg Oral Daily   finasteride  5 mg Oral QHS   insulin aspart  1-3 Units Subcutaneous TID PC & HS   metoprolol tartrate  12.5 mg Oral BID   pantoprazole  80 mg Oral Daily   rosuvastatin  40 mg Oral Daily   sodium chloride flush  3 mL Intravenous Q12H   sodium chloride flush  3 mL Intravenous Q12H   Continuous Infusions:  sodium chloride     sodium chloride     sodium chloride Stopped (10/15/22 1235)   sodium chloride Stopped (10/15/22 1235)     LOS: 14 days   Hughie Closs, MD [facility]  10/26/2022, 1:18 PM   *Please note that this is a verbal dictation therefore any spelling or grammatical errors are due to the "Dragon Medical One" system interpretation.  Please page via Amion and do not message via secure chat for urgent patient care matters. Secure chat can be used for non urgent patient care matters.  How to contact the [facility] Attending or Consulting provider 7A - 7P or covering provider during after hours 7P -7A, for this patient?  Check the care team in [facility] and look  for a) attending/consulting TR[facility] provider listed and b) the [facility] team listed. Page or secure chat 7A-7P. Log into [email or web address] and use [place]'s universal password to access. If you do not have the password, please contact the hospital operator. Locate the [facility] provider you are looking for under [facility] and page to a number that you can be directly reached. If you still have difficulty reaching the provider, please  page the [facility] (Director on Call) for the Hospitalists listed on amion for assistance.

## 2022-10-26 NOTE — Progress Notes (Signed)
Mobility Specialist Progress Note   10/26/22 1052  Therapy Vitals  Pulse Rate 100  BP 110/64  Patient Position (if appropriate) Lying  Oxygen Therapy  SpO2 100 %  O2 Device Room Air  Mobility  Activity Ambulated with assistance in hallway  Level of Assistance Contact guard assist, steadying assist  Assistive Device Four wheel walker (Upright Rollator)  Distance Ambulated (ft) 1000 ft  Activity Response Tolerated well  Mobility Referral Yes  $Mobility charge 1 Mobility   Pre Mobility: 100 HR, 110/64 BP, 100% SpO2 on RA  During Mobility: 122 HR  Post Mobility: 130 HR  Received in bed having slight c/o RUE tightness but agreeable. Mod I for bed mobility and CGA for hallway ambulation. Asymptomatic throughout ambulation w/ HR maintaining between (112 -124)bpm. Returned back to BR w/o fault, left in BR w/ NT aware of positioning.   Frederico Hamman Mobility Specialist Please contact via SecureChat or  Rehab office at [phone]

## 2022-10-27 ENCOUNTER — Other Ambulatory Visit (HOSPITAL_COMMUNITY): Payer: Self-pay

## 2022-10-27 DIAGNOSIS — I200 Unstable angina: Secondary | ICD-10-CM | POA: Diagnosis not present

## 2022-10-27 LAB — GLUCOSE, CAPILLARY
Glucose-Capillary: 126 mg/dL — ABNORMAL HIGH (ref 70–99)
Glucose-Capillary: 133 mg/dL — ABNORMAL HIGH (ref 70–99)

## 2022-10-27 MED ORDER — METOPROLOL TARTRATE 25 MG PO TABS
12.5000 mg | ORAL_TABLET | Freq: Two times a day (BID) | ORAL | 0 refills | Status: DC
  Filled 2022-10-27: qty 30, 30d supply, fill #0

## 2022-10-27 MED ORDER — ASPIRIN 81 MG PO TBEC
81.0000 mg | DELAYED_RELEASE_TABLET | Freq: Every day | ORAL | 12 refills | Status: DC
  Filled 2022-10-27: qty 30, 30d supply, fill #0

## 2022-10-27 MED ORDER — OXYCODONE HCL 5 MG PO TABS
5.0000 mg | ORAL_TABLET | ORAL | 0 refills | Status: DC | PRN
  Filled 2022-10-27: qty 20, 4d supply, fill #0

## 2022-10-27 MED ORDER — APIXABAN 5 MG PO TABS
5.0000 mg | ORAL_TABLET | Freq: Two times a day (BID) | ORAL | 0 refills | Status: DC
  Filled 2022-10-27: qty 60, 30d supply, fill #0

## 2022-10-27 MED ORDER — ROSUVASTATIN CALCIUM 40 MG PO TABS
40.0000 mg | ORAL_TABLET | Freq: Every day | ORAL | 0 refills | Status: DC
  Filled 2022-10-27: qty 30, 30d supply, fill #0

## 2022-10-27 NOTE — Progress Notes (Signed)
CCMD notified patient had 9 beats of V tach ,patient remains asymptomatic,vitals checked,MD notified

## 2022-10-27 NOTE — TOC Transition Note (Signed)
Transition of Care [facility]) - CM/SW Discharge Note   Patient Details  Name: Ralph Blackwell MRN: [id number] Date of Birth: 11-23-1940  [facility] of [facility]) CM/SW Contact:  Kermit Balo, RN Phone Number: 10/27/2022, 11:51 AM   Clinical Narrative:    Pt is discharging home with his approved wound vac through [employer]. CM has completed the forms and emailed them back to [facility]. Wound vac has been delivered to the room. Bedside RN will change it out for the hospital vac.  Pt states the hospital bed was delivered to the home this am.  Pt has other needed DME.  Pt has transportation home.   Final next level of care: Home w Home Health Services Barriers to Discharge: No Barriers Identified   Patient Goals and CMS Choice CMS Medicare.gov Compare Post Acute Care list provided to:: Patient Choice offered to / list presented to : Patient, Spouse  Discharge Placement                         Discharge Plan and Services Additional resources added to the After Visit Summary for       Post Acute Care Choice: Home Health, Durable Medical Equipment          DME Arranged: Negative pressure wound device DME Agency: [employer] Date DME Agency Contacted: 10/27/22   Representative spoke with at DME Agency: [facility] HH Arranged: RN, PT, OT [facility] Agency: [facility] Date [facility] Agency Contacted: 10/24/22 Time HH Agency Contacted: 1230 Representative spoke with at [facility] Agency: Bjorn Loser  Social Determinants of Health (SDOH) Interventions SDOH Screenings   Food Insecurity: No Food Insecurity (10/13/2022)  Housing: Low Risk  (10/13/2022)  Transportation Needs: No Transportation Needs (10/13/2022)  Utilities: Not At Risk (10/13/2022)  Tobacco Use: Low Risk  (10/22/2022)     Readmission Risk Interventions     No data to display

## 2022-10-27 NOTE — Progress Notes (Signed)
Occupational Therapy Treatment Patient Details Name: Ralph LTORAN [id number] DOB: June 09, 1941 Today's Date: 10/27/2022   History of present illness 82 year old male that underwent right transradial cath with cardiology on 10/13/22. Ultimately was found to have PEs and underwent thrombolysis and then developed right radial pseudoaneurysm with hematoma and compartment syndrome.  He underwent evacuation of the hematoma of his forearm with fasciotomies including carpal tunnel release and repair of the pseudoaneurysm. Underwent wash out and vac change 10/17/22. PMH: CAD, HTN, PE, OSA, renal insufficiency, GERD, Gout, Hiatal Hernia.   OT comments  Pt progressing towards goals this session, able to complete seated RUE therex with handout provided (given to pt in prior session). Pt reports increased stiffness in RUE, reviewed education on HEP exercises and elevation, pt verbalized understanding. Pt educated on compensatory strategy for UB dressing with wound vac. Pt presenting with impairments listed below, will follow acutely. Continue to recommend OP OT at d/c.   Recommendations for follow up therapy are one component of a multi-disciplinary discharge planning process, led by the attending physician.  Recommendations may be updated based on patient status, additional functional criteria and insurance authorization.    Assistance Recommended at Discharge Intermittent Supervision/Assistance  Patient can return home with the following  A little help with bathing/dressing/bathroom;Assistance with cooking/housework;Assist for transportation   Equipment Recommendations  Tub/shower seat    Recommendations for Other Services      Precautions / Restrictions Precautions Precautions: Fall Precaution Comments: R arm wound vac, watch HR Restrictions Weight Bearing Restrictions: No Other Position/Activity Restrictions: Keep RUE elevated when resting       Mobility Bed Mobility Overal bed mobility:  Modified Independent             General bed mobility comments: increased time and use of rails    Transfers Overall transfer level: Modified independent Equipment used: Rollator (4 wheels) Transfers: Sit to/from Stand Sit to Stand: Min guard           General transfer comment: cues to lock brakes     Balance Overall balance assessment: Mild deficits observed, not formally tested                                         ADL either performed or assessed with clinical judgement   ADL Overall ADL's : Needs assistance/impaired     Grooming: Oral care;Sitting;Set up Grooming Details (indicate cue type and reason): uses R hand as assist             Lower Body Dressing: Moderate assistance;Sitting/lateral leans Lower Body Dressing Details (indicate cue type and reason): uses LUE to pull up socks Toilet Transfer: Stand-pivot;Min guard (upright rollator)           Functional mobility during ADLs: Min guard      Extremity/Trunk Assessment Upper Extremity Assessment Upper Extremity Assessment: RUE deficits/detail RUE Deficits / Details: Shoulder and elbow ROM 3/5, able to oppose thumb to digits 2-4, 2/5 grasp RUE Coordination: decreased fine motor;decreased gross motor   Lower Extremity Assessment Lower Extremity Assessment: Defer to PT evaluation        Vision       Perception Perception Perception: Not tested   Praxis Praxis Praxis: Not tested    Cognition Arousal/Alertness: Awake/alert Behavior During Therapy: WFL for tasks assessed/performed Overall Cognitive Status: Within Functional Limits for tasks assessed  Exercises General Exercises - Upper Extremity Shoulder Flexion: AROM, Right, 10 reps, Seated Elbow Flexion: AROM, Right, 10 reps, Seated Elbow Extension: AROM, Right, 10 reps, Seated Digit Composite Flexion: AROM, Right, 10 reps, Seated Composite Extension:  AROM, Right, 10 reps, Seated Hand Exercises Digit Composite Abduction: AROM, Right, 10 reps, Seated Digit Composite Adduction: AROM, Right, 10 reps, Seated Opposition: AROM, Right, 5 reps, Seated Other Exercises Other Exercises: reviewed joint blocking exercises    Shoulder Instructions       General Comments VSS on RA    Pertinent Vitals/ Pain       Pain Assessment Pain Assessment: Faces Pain Score: 6  Faces Pain Scale: Hurts even more Pain Location: R forearm and hand Pain Descriptors / Indicators: Dull, Throbbing Pain Intervention(s): Limited activity within patient's tolerance, Monitored during session, Repositioned  Home Living                                          Prior Functioning/Environment              Frequency  Min 5X/week        Progress Toward Goals  OT Goals(current goals can now be found in the care plan section)  Progress towards OT goals: Progressing toward goals  Acute Rehab OT Goals Patient Stated Goal: none stated OT Goal Formulation: With patient Time For Goal Achievement: 10/31/22 Potential to Achieve Goals: Good ADL Goals Pt Will Perform Upper Body Bathing: with set-up;with supervision;with caregiver independent in assisting;sitting Pt Will Perform Upper Body Dressing: with supervision;with set-up;with caregiver independent in assisting;sitting Pt Will Transfer to Toilet: with modified independence Pt/caregiver will Perform Home Exercise Program: Increased ROM;Increased strength;Right Upper extremity;With theraputty;Independently;With written HEP provided  Plan Frequency remains appropriate;Discharge plan remains appropriate    Co-evaluation                 AM-PAC OT "6 Clicks" Daily Activity     Outcome Measure   Help from another person eating meals?: A Little Help from another person taking care of personal grooming?: A Little Help from another person toileting, which includes using toliet, bedpan,  or urinal?: A Little Help from another person bathing (including washing, rinsing, drying)?: A Lot Help from another person to put on and taking off regular upper body clothing?: A Little Help from another person to put on and taking off regular lower body clothing?: A Lot 6 Click Score: 16    End of Session Equipment Utilized During Treatment: Gait belt;Other (comment)  OT Visit Diagnosis: Muscle weakness (generalized) (M62.81);Pain Pain - Right/Left: Right Pain - part of body: Arm   Activity Tolerance Patient tolerated treatment well   Patient Left in chair;with call bell/phone within reach   Nurse Communication Mobility status        Time: 2130-8657 OT Time Calculation (min): 33 min  Charges: OT General Charges $OT Visit: 1 Visit OT Treatments $Self Care/Home Management : 8-22 mins $Therapeutic Exercise: 8-22 mins  Carver Fila, OTD, OTR/L SecureChat Preferred Acute Rehab [phone]   Carver Fila Koonce 10/27/2022, 9:47 AM

## 2022-10-27 NOTE — Progress Notes (Signed)
Physical Therapy Treatment Patient Details Name: Ralph Blackwell MRN: [id number] DOB: Sep 05, 1940 Today's Date: 10/27/2022   History of Present Illness 82 year old male that underwent right transradial cath with cardiology on 10/13/22. Ultimately was found to have PEs and underwent thrombolysis and then developed right radial pseudoaneurysm with hematoma and compartment syndrome.  He underwent evacuation of the hematoma of his forearm with fasciotomies including carpal tunnel release and repair of the pseudoaneurysm. Underwent wash out and vac change 10/17/22. PMH: CAD, HTN, PE, OSA, renal insufficiency, GERD, Gout, Hiatal Hernia.    PT Comments    Pt received in supine, agreeable to therapy session with emphasis on transfer safety and gait training with upright rollator. Pt SpO2 WFL on RA, HR 110-low 120's bpm with exertion, brief spikes to 130 but only momentary. Min reminders for locking/unlocking upright rollator needed, otherwise no functional mobility concerns. Pt reports DME is being delivered to his home today and spouse will assist him into home. Pt continues to benefit from PT services to progress toward functional mobility goals.    Recommendations for follow up therapy are one component of a multi-disciplinary discharge planning process, led by the attending physician.  Recommendations may be updated based on patient status, additional functional criteria and insurance authorization.  Follow Up Recommendations       Assistance Recommended at Discharge PRN  Patient can return home with the following Assistance with cooking/housework;Assist for transportation   Equipment Recommendations  Other (comment);Hospital bed (upright rollator)    Recommendations for Other Services       Precautions / Restrictions Precautions Precautions: Fall Precaution Comments: R arm wound vac, watch HR Restrictions Weight Bearing Restrictions: No Other Position/Activity Restrictions: Keep RUE elevated  when resting     Mobility  Bed Mobility Overal bed mobility: Modified Independent             General bed mobility comments: increased time and use of rails    Transfers Overall transfer level: Needs assistance Equipment used: Rollator (4 wheels) Transfers: Sit to/from Stand Sit to Stand: Supervision           General transfer comment: cues to lock brakes and to unlock upon standing or getting go of rollator handles    Ambulation/Gait Ambulation/Gait assistance: Modified independent (Device/Increase time) Gait Distance (Feet): 450 Feet Assistive device: Standup Rollator Gait Pattern/deviations: Step-through pattern, Decreased step length - right, Decreased step length - left, Trunk flexed Gait velocity: slowed     General Gait Details: HR remained around 110-122 bpm, brief spikes x2 to ~130 with transfers/gait, no significant gait deviations. Pt was able to perform dual tasking with talking/walking without difficulty, mild DOE 1-2/4 improves with standing breaks and pursed-lip breathing. SpO2 WFL on RA >96% throughout when sensor reading well on his finger.   Stairs         General stair comments: pt reports no concerns, he performed them on Saturday   Wheelchair Mobility    Modified Rankin (Stroke Patients Only)       Balance Overall balance assessment: No apparent balance deficits (not formally assessed) (steady for standing with U UE support (keeping RUE elevated), no LOB with static standing texting with LUE)                                          Cognition Arousal/Alertness: Awake/alert Behavior During Therapy: WFL for tasks assessed/performed Overall Cognitive Status: Within  Functional Limits for tasks assessed                                 General Comments: min cues throughout for use of brakes on upright rollator        Exercises      General Comments General comments (skin integrity, edema, etc.): HR  to 131 bpm briefly, mostly low 120's at highest with functional tasks; resting HR 106 bpm in supine.      Pertinent Vitals/Pain Pain Assessment Pain Assessment: Faces Faces Pain Scale: Hurts little more Pain Location: R forearm and hand Pain Descriptors / Indicators: Dull, Throbbing Pain Intervention(s): Monitored during session, Repositioned, Other (comment) (PO non-opioid pain meds at 7AM)     PT Goals (current goals can now be found in the care plan section) Acute Rehab PT Goals Patient Stated Goal: get back to walking with my dog and yard work PT Goal Formulation: With patient/family Time For Goal Achievement: 10/31/22 Progress towards PT goals: Progressing toward goals    Frequency    Min 1X/week      PT Plan Current plan remains appropriate       AM-PAC PT "6 Clicks" Mobility   Outcome Measure  Help needed turning from your back to your side while in a flat bed without using bedrails?: None Help needed moving from lying on your back to sitting on the side of a flat bed without using bedrails?: A Little (flat bed, no rails) Help needed moving to and from a bed to a chair (including a wheelchair)?: None Help needed standing up from a chair using your arms (e.g., wheelchair or bedside chair)?: A Little Help needed to walk in hospital room?: A Little Help needed climbing 3-5 steps with a railing? : A Little 6 Click Score: 20    End of Session Equipment Utilized During Treatment: Gait belt Activity Tolerance: Patient tolerated treatment well Patient left: with call bell/phone within reach;Other (comment) (in bathroom on toilet, pt able to reach call bell once complete) Nurse Communication: Mobility status PT Visit Diagnosis: Difficulty in walking, not elsewhere classified (R26.2)     Time: 7672-0947 PT Time Calculation (min) (ACUTE ONLY): 17 min  Charges:  $Gait Training: 8-22 mins                     Maurio Baize P., PTA Acute Rehabilitation Services Secure Chat  Preferred 9a-5:30pm Office: [phone]    Angus Palms 10/27/2022, 12:27 PM

## 2022-10-27 NOTE — Care Management Important Message (Signed)
Important Message  Patient Details  Name: Ralph Blackwell MRN: [id number] Date of Birth: [birth year]   Medicare Important Message Given:  Yes     Renie Ora 10/27/2022, 10:43 AM

## 2022-10-27 NOTE — Progress Notes (Addendum)
[facility]  Subjective  - doing well and he states his hand is improving.   Objective 133/74 80 98.4 F (36.9 C) (Oral) 20 98%  Intake/Output Summary (Last 24 hours) at 10/27/2022 0738 Last data filed at 10/27/2022 0448 Gross per 24 hour  Intake 120 ml  Output 1450 ml  Net -1330 ml    Decreased edema, motor and sensation intact right UE Palpable radial pulses with improved motor Drain site without drainage dry Vac to good suction no increased drainage in canister  Assessment/Planning: 82 y.o. male is s/p:  I&D  and washout of right arm fasciotomy 3 Days Post-Op  Improved motor and decreased edema  Good vac suction pending delivery of home vac Stable condition from a vascular point of view   Ralph Blackwell 10/27/2022 7:38 AM --  Laboratory Lab Results: Recent Labs    10/25/22 0100 10/25/22 1131  WBC 9.7  --   HGB 7.1* 8.1*  HCT 22.5* 25.4*  PLT 311  --    BMET No results for input(s): "NA", "K", "CL", "CO2", "GLUCOSE", "BUN", "CREATININE", "CALCIUM" in the last 72 hours.  COAG Lab Results  Component Value Date   INR 1.3 (H) 10/15/2022   INR 1.3 (H) 10/15/2022   INR 1.8 (H) 01/07/2008   No results found for: "PTT"  I have seen and evaluated the patient. I agree with the PA note as documented above.  Right forearm VAC with good seal.  No hematoma since drain removed.  Myriad grafting complete this admission.  First VAC change in the office 8 to 10 days status post last procedure.  I have sent our office a message for follow-up.  Awaiting home vac.  Cephus Shelling, MD [facility] Office: [phone]

## 2022-10-27 NOTE — Discharge Summary (Signed)
Physician Discharge Summary  Ralph Blackwell ZOX:[id number] DOB: [birth year] DOA: 10/11/2022  PCP: Joya Martyr, MD (Inactive)  Admit date: 10/11/2022 Discharge date: 10/27/2022 30 Day Unplanned Readmission Risk Score    Flowsheet Row ED to Hosp-Admission (Current) from 10/11/2022 in [facility] 4E CV SURGICAL PROGRESSIVE CARE  30 Day Unplanned Readmission Risk Score (%) 13.32 Filed at 10/27/2022 0401       This score is the patient's risk of an unplanned readmission within 30 days of being discharged (0 -100%). The score is based on dignosis, age, lab data, medications, orders, and past utilization.   Low:  0-14.9   Medium: 15-21.9   High: 22-29.9   Extreme: 30 and above          Admitted From: Home Disposition: Home  Recommendations for Outpatient Follow-up:  Follow up with PCP in 1-2 weeks Please obtain BMP/CBC in one week Follow-up with vascular surgery and orthopedics as outpatient within a week for wound VAC exchange. Follow-up with cardiology in few weeks as they have a scheduled. Please follow up with your PCP on the following pending results: Unresulted Labs (From admission, onward)    None         Home Health: Yes Equipment/Devices: Wound VAC and hospital bed  Discharge Condition: Stable CODE STATUS: Full code Diet recommendation: Cardiac  Subjective: Seen and examined.  No complaints.  He is very anxious to get out of here today.  Brief/Interim Summary: Ralph Blackwell is a 82 year old gentleman who presented emergency department with chest pain, shortness of breath. He was given nitroglycerin, He was also eventually placed on Nitropaste.  Due to concern of unstable angina, he was started on heparin drip.  Cardiology consulted, he underwent cardiac cath on 10/13/2022 with clean coronaries.  Of note, they traveled approximately 3 weeks ago to the [place].  Due to him having past history of PE, and elevated right atrial pressure on the updated echo, this raised concern for  possible PE.  CTA was obtained after cath on 10/13/2022 and he was diagnosed to have saddle PE.  IR and PCCM was consulted and he was transferred to ICU and underwent catheter-directed TPA/lysis with IR 4/2. Initially tolerated well, but late 4/2PM-4/3AM developed severe pain, bruising and swelling of RUE with concern for compartment syndrome of R forearm. Underwent fasciotomy with [facility] 10/15/22 where source was prior arteriotomy site from cath; [facility] placed.  He ended and washout x 2 after that.  Orthopedics Dr. Frazier Butt was also involved.  He was on heparin.  He was eventually transferred under [facility] on 10/22/2022.   Submassive saddle PE s/p catheter directed lytics: Currently hemodynamically stable, was initially on heparin, started on Eliquis 10/24/2022.   Compartment syndrome of the right forearm: hand swelling and hematoma from bleeding into the forearm from previous catheter access site. Unfortunately this is an iatrogenic complication of tPA administration for pulmonary embolism.  Radial artery pseudoaneurysm, s/p repair.Underwent fasciotomy with [facility] 4/3 where source was prior arteriotomy site from cath; [facility] placed. Now s/p second washout/hematoma evacuation/WV replacement with [facility]. Remains on heparin gtt for resolving PE.  Underwent another washout of the right forearm fasciotomy and excisional debridement of muscle right forearm and VAC change.  Per vascular surgery, next VAC change will be in 8 to 10 days as outpatient.   His right upper extremity still ecchymotic but improved compared to yesterday.  Hand function is improving as well.  Of note, his drain was removed 10/25/2022.  He is cleared by orthopedics and vascular  surgery and is going to be discharged home.  Will follow-up with vascular surgery in 8 to 10 days for wound VAC change.   Acute blood loss anemia due to bleeding into forearm: Patient received total of 2 units of PRBC transfusion on 10/14/2022 and 10/15/2022.  Hemoglobin improved to 8 without  transfusion.   Thrombocytopenia, chronic: Improved   Mixed hyperlipidemia: Continue statin.   Essential hypertension: Controlled.  Continue Avapro and Toprol-XL.   GERD: Continue PPI.  Discharge plan was discussed with patient and/or family member and they verbalized understanding and agreed with it.  Discharge Diagnoses:  Principal Problem:   Unstable angina Active Problems:   Essential hypertension   Mixed hyperlipidemia   Thrombocytopenia   Atypical angina   Acute pulmonary embolism with acute cor pulmonale   Hematoma of forearm   Compartment syndrome of right hand   Thrombocytopenia, unspecified   PAF (paroxysmal atrial fibrillation)    Discharge Instructions   Allergies as of 10/27/2022   No Known Allergies      Medication List     STOP taking these medications    telmisartan 80 MG tablet Commonly known as: MICARDIS       TAKE these medications    allopurinol 300 MG tablet Commonly known as: ZYLOPRIM Take 150 mg by mouth daily.   apixaban 5 MG Tabs tablet Commonly known as: ELIQUIS Take 1 tablet (5 mg total) by mouth 2 (two) times daily.   ascorbic acid 500 MG tablet Commonly known as: VITAMIN C Take 500 mg by mouth daily.   aspirin EC 81 MG tablet Take 1 tablet (81 mg total) by mouth daily. Swallow whole.   finasteride 5 MG tablet Commonly known as: PROSCAR Take 5 mg by mouth at bedtime.   Fish Oil 1000 MG Caps Take 1,000 mg by mouth daily.   fluticasone 50 MCG/ACT nasal spray Commonly known as: FLONASE Place 1 spray into both nostrils every evening.   magnesium gluconate 500 MG tablet Commonly known as: MAGONATE Take 500 mg by mouth daily.   metoprolol tartrate 25 MG tablet Commonly known as: LOPRESSOR Take 0.5 tablets (12.5 mg total) by mouth 2 (two) times daily.   nitroGLYCERIN 0.4 MG SL tablet Commonly known as: NITROSTAT Place 0.4 mg under the tongue every 5 (five) minutes as needed for chest pain.   omeprazole 40 MG  capsule Commonly known as: PRILOSEC Take 1 capsule (40 mg total) by mouth daily.   oxyCODONE 5 MG immediate release tablet Commonly known as: Oxy IR/ROXICODONE Take 1 tablet (5 mg total) by mouth every 4 (four) hours as needed for moderate pain or severe pain.   rosuvastatin 40 MG tablet Commonly known as: CRESTOR Take 1 tablet (40 mg total) by mouth daily. What changed:  medication strength how much to take   Vitamin D3 125 MCG (5000 UT) Caps Take 2,000 Units by mouth daily with supper.   zinc gluconate 50 MG tablet Take 50 mg by mouth daily.               Durable Medical Equipment  (From admission, onward)           Start     Ordered   10/24/22 1420  For home use only DME Hospital bed  Once       Question Answer Comment  Length of Need 12 Months   The above medical condition requires: Patient requires the ability to reposition frequently   Bed type Semi-electric  10/24/22 1419   10/24/22 1354  For home use only DME Hospital bed  Once       Question Answer Comment  Length of Need 6 Months   The above medical condition requires: Patient requires the ability to reposition frequently   Head must be elevated greater than: 30 degrees   Bed type Semi-electric      10/24/22 1353   10/23/22 1529  For home use only DME Negative pressure wound device  Once       Question Answer Comment  Frequency of dressing change 3 times per week   Length of need 6 Months   Dressing type Foam   Amount of suction 120 mm/Hg   Pressure application Continuous pressure   Supplies 10 canisters and 15 dressings per month for duration of therapy      10/23/22 1530            Follow-up Information     Christell Constant, MD Follow up.   Specialty: Cardiology Why: Humberto Seals - [address] location - cardiology follow-up arranged with Dr. Izora Ribas on Thursday Nov 13, 2022 at 2:30 PM (Arrive by 2:15 PM). We have cancelled the appointment with Dr.  Rosemary Holms. Contact information: [address] [place] [address] [phone]         [facility] Follow up.   Why: ([employer])- hospital bed arranged- they will contact you regarding delivery Contact information: [address] [facility] [place] [address] [phone]         [facility] Follow up.   Why: Iantha Fallen-  branch local to your home)- PT/OT arranged with [facility] office protocol- they wil contact you to schedule Contact information: [address] [place] [address] [phone]         Cephus Shelling, MD Follow up.   Specialty: Vascular Surgery Why: Office will call you to arrange your appt (sent) Contact information: [address] [place] [address] [phone]         Joya Martyr, MD Follow up in 1 week(s).   Specialty: Internal Medicine Contact information: [address] [place] [address] [phone]         Joya Martyr, MD Follow up.   Specialty: Internal Medicine Contact information: [address] [place] [address] [phone]                No Known Allergies  Consultations: Cardiology, vascular surgery, orthopedics, PCCM, radiology   Procedures/Studies: VAS [place] UPPER EXTREMITY ARTERIAL DUPLEX  Result Date: 10/15/2022  UPPER EXTREMITY DUPLEX STUDY Patient Name:  Ralph Blackwell  Date of Exam:   10/15/2022 Medical Rec #: [id number]        Accession #:    [id number] Date of Birth: 1941-05-08        Patient Gender: M Patient Age:   61 years Exam Location:  [facility] Procedure:      VAS [place] UPPER EXTREMITY ARTERIAL DUPLEX Referring Phys: Heath Lark --------------------------------------------------------------------------------  Performing Technologist: Ernestene Mention RVT, RDMS  Examination Guidelines: A complete evaluation includes B-mode imaging, spectral Doppler, color Doppler, and power Doppler as needed of all accessible  portions of each vessel. Bilateral testing is considered an integral part of a complete examination. Limited examinations for reoccurring indications may be performed as noted.  Right Doppler Findings: +---------------+----------+-----------+--------+-----------------------------+ Site           PSV (cm/s)Waveform  StenosisComments                      +---------------+----------+-----------+--------+-----------------------------+ Subclavian Dist                                                           +---------------+----------+-----------+--------+-----------------------------+ Radial Prox                                 not visualized - IV placement +---------------+----------+-----------+--------+-----------------------------+ Radial Mid     53        multiphasic                                      +---------------+----------+-----------+--------+-----------------------------+ Radial Dist    105       multiphasic                                      +---------------+----------+-----------+--------+-----------------------------+ Ulnar Dist     45        triphasic                                        +---------------+----------+-----------+--------+-----------------------------+   An area with well defined borders measuring 2.4 cm x 2.0 cm was visualized arising off of the distal radial artery with ultrasound characteristics of a pseudoaneurysm. Mixed echos within the structure suggest that it is partially thrombosed with a residual diameter of 0.5 cm x 0.4 cm. Unable to visualize radial,ulnar, or brachial veins - possibly due to extrinsic compression from subcutaneous edema.  Electronically signed by Heath Lark on 10/15/2022 at 9:55:33 PM.    Final    IR THROMB F/U EVAL ART/VEN FINAL DAY (MS)  Result Date: 10/15/2022 INDICATION: 82 year old male status post thrombolysis for submassive pulmonary embolism EXAM: FOLLOW-UP PULMONARY ARTERY THROMBOLYSIS COMPARISON:   10/14/2022, CT 10/13/2022 MEDICATIONS: None ANESTHESIA/SEDATION: None FLUOROSCOPY: None COMPLICATIONS: None TECHNIQUE: Informed written consent was previously obtained from the patient after a thorough discussion of the procedural risks, benefits and alternatives. Bedside trans duction of both the left and the right pulmonary artery lysis catheters was attempted in the ICU. A confident waveform pressure could not be confirmed, with multiple troubleshooting steps. Ultimately, the catheters were withdrawn without obtaining a follow-up pulmonary artery pressure. Both lysis catheters were removed. Given the patient's operative status, the venous sheaths were left in place for central venous access. FINDINGS: No central venous pressure was obtained. IMPRESSION: Follow-up pulmonary artery thrombolysis as above. Electronically Signed   By: Gilmer Mor D.O.   On: 10/15/2022 17:00   VAS [place] LOWER EXTREMITY VENOUS (DVT)  Result Date: 10/14/2022  Lower Venous DVT Study Patient Name:  Ralph Blackwell  Date of Exam:   10/14/2022 Medical Rec #: [id number]        Accession #:    [id number] Date of Birth: 1941-01-20        Patient Gender: M Patient Age:   40 years Exam Location:  [facility] Procedure:  VAS [place] LOWER EXTREMITY VENOUS (DVT) Referring Phys: Canary Brim --------------------------------------------------------------------------------  Indications: Pulmonary embolism.  Risk Factors: Confirmed PE. Anticoagulation: Heparin. Limitations: Bandages, line and patient positioning, patient immobility. Comparison Study: No prior studies. Performing Technologist: Chanda Busing RVT  Examination Guidelines: A complete evaluation includes B-mode imaging, spectral Doppler, color Doppler, and power Doppler as needed of all accessible portions of each vessel. Bilateral testing is considered an integral part of a complete examination. Limited examinations for reoccurring indications may be performed as noted. The reflux  portion of the exam is performed with the patient in reverse Trendelenburg.  +---------+---------------+---------+-----------+----------+--------------+ RIGHT    CompressibilityPhasicitySpontaneityPropertiesThrombus Aging +---------+---------------+---------+-----------+----------+--------------+ CFV      Full           Yes      Yes                                 +---------+---------------+---------+-----------+----------+--------------+ SFJ      Full                                                        +---------+---------------+---------+-----------+----------+--------------+ FV Prox  Full                                                        +---------+---------------+---------+-----------+----------+--------------+ FV Mid   Full                                                        +---------+---------------+---------+-----------+----------+--------------+ FV DistalFull                                                        +---------+---------------+---------+-----------+----------+--------------+ PFV      Full                                                        +---------+---------------+---------+-----------+----------+--------------+ POP      None           No       No                   Acute          +---------+---------------+---------+-----------+----------+--------------+ PTV      Full                                                        +---------+---------------+---------+-----------+----------+--------------+ PERO     Full                                                        +---------+---------------+---------+-----------+----------+--------------+  Gastroc  Partial                                      Acute          +---------+---------------+---------+-----------+----------+--------------+   +---------+---------------+---------+-----------+----------+--------------+ LEFT      CompressibilityPhasicitySpontaneityPropertiesThrombus Aging +---------+---------------+---------+-----------+----------+--------------+ CFV      Full           Yes      Yes                                 +---------+---------------+---------+-----------+----------+--------------+ SFJ      Full                                                        +---------+---------------+---------+-----------+----------+--------------+ FV Prox  Full                                                        +---------+---------------+---------+-----------+----------+--------------+ FV Mid   Full                                                        +---------+---------------+---------+-----------+----------+--------------+ FV DistalFull                                                        +---------+---------------+---------+-----------+----------+--------------+ PFV      Full                                                        +---------+---------------+---------+-----------+----------+--------------+ POP      Full           Yes      Yes                                 +---------+---------------+---------+-----------+----------+--------------+ PTV      Full                                                        +---------+---------------+---------+-----------+----------+--------------+ PERO     Full                                                        +---------+---------------+---------+-----------+----------+--------------+  Summary: RIGHT: - Findings consistent with acute deep vein thrombosis involving the right popliteal vein, and right gastrocnemius veins. - No cystic structure found in the popliteal fossa.  LEFT: - There is no evidence of deep vein thrombosis in the lower extremity.  - No cystic structure found in the popliteal fossa.  *See table(s) above for measurements and observations. Electronically signed by Waverly Ferrari MD on 10/14/2022  at 4:22:02 PM.    Final    IR US Guide Vasc Access Right  Result Date: 10/14/2022 INDICATION: Submassive saddle pulmonary embolus, right heart strain, chest discomfort despite heparin therapy EXAM: ULTRASOUND GUIDANCE FOR VASCULAR ACCESS BILATERAL PULMONARY ARTERY CATHETERIZATIONS, ANGIOGRAMS, PRESSURE MEASUREMENT, AND INFUSION CATHETER PLACEMENTS FOR PE THROMBO LYSIS COMPARISON:  None Available. MEDICATIONS: 1% lidocaine local ANESTHESIA/SEDATION: Moderate (conscious) sedation was employed during this procedure. A total of Versed 1.5 mg and Fentanyl 50 mcg was administered intravenously by the radiology nurse. Total intra-service moderate Sedation Time: The 40 minutes. The patient's level of consciousness and vital signs were monitored continuously by radiology nursing throughout the procedure under my direct supervision. FLUOROSCOPY: Radiation Exposure Index (as provided by the fluoroscopic device): 159 mGy Kerma COMPLICATIONS: None immediate. TECHNIQUE: Informed written consent was obtained from the patient after a thorough discussion of the procedural risks, benefits and alternatives. All questions were addressed. Maximal Sterile Barrier Technique was utilized including caps, mask, sterile gowns, sterile gloves, sterile drape, hand hygiene and skin antiseptic. A timeout was performed prior to the initiation of the procedure. Under sterile conditions and local anesthesia, ultrasound micropuncture access performed the right common femoral vein twice. Adjacent 6 French sheath were inserted over Bentson guidewires into the right common femoral vein. Images obtained for documentation of the patent right common femoral vein. Five [place] PA catheters were advanced into the pulmonary arteries bilaterally utilizing both Bentson and 035 glide wires. Contrast injection performed for bilateral limited pulmonary angiography. Nearly occlusive central thrombus present on the right. More peripheral nonocclusive thrombus  present on the left. Central PA pressure measurement: 42/15 (25). Over bilateral Rosen guidewires, the 90 cm infusion catheters with 10 cm infusion length were advanced into the pulmonary arteries. Internal occlusion wires advanced. Images obtained for documentation of catheter positions. Access secured with a silk suture. 12 hour thrombolysis protocol will be initiated through the bilateral pulmonary artery UniFuse catheters. FINDINGS: Imaging confirms right greater than left central pulmonary emboli better demonstrated by the comparison CTA. Successful placement of bilateral 90 cm infusion catheters with 10 cm infusion leaks into both pulmonary arteries. IMPRESSION: Successful placement of bilateral pulmonary artery infusion catheters for PE thrombolysis 12 hour protocol. Electronically Signed   By: Judie Petit.  Shick M.D.   On: 10/14/2022 13:10   IR INFUSION THROMBOL ARTERIAL INITIAL (MS)  Result Date: 10/14/2022 INDICATION: Submassive saddle pulmonary embolus, right heart strain, chest discomfort despite heparin therapy EXAM: ULTRASOUND GUIDANCE FOR VASCULAR ACCESS BILATERAL PULMONARY ARTERY CATHETERIZATIONS, ANGIOGRAMS, PRESSURE MEASUREMENT, AND INFUSION CATHETER PLACEMENTS FOR PE THROMBO LYSIS COMPARISON:  None Available. MEDICATIONS: 1% lidocaine local ANESTHESIA/SEDATION: Moderate (conscious) sedation was employed during this procedure. A total of Versed 1.5 mg and Fentanyl 50 mcg was administered intravenously by the radiology nurse. Total intra-service moderate Sedation Time: The 40 minutes. The patient's level of consciousness and vital signs were monitored continuously by radiology nursing throughout the procedure under my direct supervision. FLUOROSCOPY: Radiation Exposure Index (as provided by the fluoroscopic device): 159 mGy Kerma COMPLICATIONS: None immediate. TECHNIQUE: Informed written consent was obtained from the patient after a thorough discussion  of the procedural risks, benefits and alternatives.  All questions were addressed. Maximal Sterile Barrier Technique was utilized including caps, mask, sterile gowns, sterile gloves, sterile drape, hand hygiene and skin antiseptic. A timeout was performed prior to the initiation of the procedure. Under sterile conditions and local anesthesia, ultrasound micropuncture access performed the right common femoral vein twice. Adjacent 6 French sheath were inserted over Bentson guidewires into the right common femoral vein. Images obtained for documentation of the patent right common femoral vein. Five [place] PA catheters were advanced into the pulmonary arteries bilaterally utilizing both Bentson and 035 glide wires. Contrast injection performed for bilateral limited pulmonary angiography. Nearly occlusive central thrombus present on the right. More peripheral nonocclusive thrombus present on the left. Central PA pressure measurement: 42/15 (25). Over bilateral Rosen guidewires, the 90 cm infusion catheters with 10 cm infusion length were advanced into the pulmonary arteries. Internal occlusion wires advanced. Images obtained for documentation of catheter positions. Access secured with a silk suture. 12 hour thrombolysis protocol will be initiated through the bilateral pulmonary artery UniFuse catheters. FINDINGS: Imaging confirms right greater than left central pulmonary emboli better demonstrated by the comparison CTA. Successful placement of bilateral 90 cm infusion catheters with 10 cm infusion leaks into both pulmonary arteries. IMPRESSION: Successful placement of bilateral pulmonary artery infusion catheters for PE thrombolysis 12 hour protocol. Electronically Signed   By: Judie Petit.  Shick M.D.   On: 10/14/2022 13:10   IR INFUSION THROMBOL ARTERIAL INITIAL (MS)  Result Date: 10/14/2022 INDICATION: Submassive saddle pulmonary embolus, right heart strain, chest discomfort despite heparin therapy EXAM: ULTRASOUND GUIDANCE FOR VASCULAR ACCESS BILATERAL PULMONARY ARTERY  CATHETERIZATIONS, ANGIOGRAMS, PRESSURE MEASUREMENT, AND INFUSION CATHETER PLACEMENTS FOR PE THROMBO LYSIS COMPARISON:  None Available. MEDICATIONS: 1% lidocaine local ANESTHESIA/SEDATION: Moderate (conscious) sedation was employed during this procedure. A total of Versed 1.5 mg and Fentanyl 50 mcg was administered intravenously by the radiology nurse. Total intra-service moderate Sedation Time: The 40 minutes. The patient's level of consciousness and vital signs were monitored continuously by radiology nursing throughout the procedure under my direct supervision. FLUOROSCOPY: Radiation Exposure Index (as provided by the fluoroscopic device): 159 mGy Kerma COMPLICATIONS: None immediate. TECHNIQUE: Informed written consent was obtained from the patient after a thorough discussion of the procedural risks, benefits and alternatives. All questions were addressed. Maximal Sterile Barrier Technique was utilized including caps, mask, sterile gowns, sterile gloves, sterile drape, hand hygiene and skin antiseptic. A timeout was performed prior to the initiation of the procedure. Under sterile conditions and local anesthesia, ultrasound micropuncture access performed the right common femoral vein twice. Adjacent 6 French sheath were inserted over Bentson guidewires into the right common femoral vein. Images obtained for documentation of the patent right common femoral vein. Five [place] PA catheters were advanced into the pulmonary arteries bilaterally utilizing both Bentson and 035 glide wires. Contrast injection performed for bilateral limited pulmonary angiography. Nearly occlusive central thrombus present on the right. More peripheral nonocclusive thrombus present on the left. Central PA pressure measurement: 42/15 (25). Over bilateral Rosen guidewires, the 90 cm infusion catheters with 10 cm infusion length were advanced into the pulmonary arteries. Internal occlusion wires advanced. Images obtained for documentation of  catheter positions. Access secured with a silk suture. 12 hour thrombolysis protocol will be initiated through the bilateral pulmonary artery UniFuse catheters. FINDINGS: Imaging confirms right greater than left central pulmonary emboli better demonstrated by the comparison CTA. Successful placement of bilateral 90 cm infusion catheters with 10 cm infusion leaks into both  pulmonary arteries. IMPRESSION: Successful placement of bilateral pulmonary artery infusion catheters for PE thrombolysis 12 hour protocol. Electronically Signed   By: Judie Petit.  Shick M.D.   On: 10/14/2022 13:10   IR Angiogram Pulmonary Bilateral Selective  Result Date: 10/14/2022 INDICATION: Submassive saddle pulmonary embolus, right heart strain, chest discomfort despite heparin therapy EXAM: ULTRASOUND GUIDANCE FOR VASCULAR ACCESS BILATERAL PULMONARY ARTERY CATHETERIZATIONS, ANGIOGRAMS, PRESSURE MEASUREMENT, AND INFUSION CATHETER PLACEMENTS FOR PE THROMBO LYSIS COMPARISON:  None Available. MEDICATIONS: 1% lidocaine local ANESTHESIA/SEDATION: Moderate (conscious) sedation was employed during this procedure. A total of Versed 1.5 mg and Fentanyl 50 mcg was administered intravenously by the radiology nurse. Total intra-service moderate Sedation Time: The 40 minutes. The patient's level of consciousness and vital signs were monitored continuously by radiology nursing throughout the procedure under my direct supervision. FLUOROSCOPY: Radiation Exposure Index (as provided by the fluoroscopic device): 159 mGy Kerma COMPLICATIONS: None immediate. TECHNIQUE: Informed written consent was obtained from the patient after a thorough discussion of the procedural risks, benefits and alternatives. All questions were addressed. Maximal Sterile Barrier Technique was utilized including caps, mask, sterile gowns, sterile gloves, sterile drape, hand hygiene and skin antiseptic. A timeout was performed prior to the initiation of the procedure. Under sterile conditions  and local anesthesia, ultrasound micropuncture access performed the right common femoral vein twice. Adjacent 6 French sheath were inserted over Bentson guidewires into the right common femoral vein. Images obtained for documentation of the patent right common femoral vein. Five [place] PA catheters were advanced into the pulmonary arteries bilaterally utilizing both Bentson and 035 glide wires. Contrast injection performed for bilateral limited pulmonary angiography. Nearly occlusive central thrombus present on the right. More peripheral nonocclusive thrombus present on the left. Central PA pressure measurement: 42/15 (25). Over bilateral Rosen guidewires, the 90 cm infusion catheters with 10 cm infusion length were advanced into the pulmonary arteries. Internal occlusion wires advanced. Images obtained for documentation of catheter positions. Access secured with a silk suture. 12 hour thrombolysis protocol will be initiated through the bilateral pulmonary artery UniFuse catheters. FINDINGS: Imaging confirms right greater than left central pulmonary emboli better demonstrated by the comparison CTA. Successful placement of bilateral 90 cm infusion catheters with 10 cm infusion leaks into both pulmonary arteries. IMPRESSION: Successful placement of bilateral pulmonary artery infusion catheters for PE thrombolysis 12 hour protocol. Electronically Signed   By: Judie Petit.  Shick M.D.   On: 10/14/2022 13:10   IR Angiogram Selective Each Additional Vessel  Result Date: 10/14/2022 INDICATION: Submassive saddle pulmonary embolus, right heart strain, chest discomfort despite heparin therapy EXAM: ULTRASOUND GUIDANCE FOR VASCULAR ACCESS BILATERAL PULMONARY ARTERY CATHETERIZATIONS, ANGIOGRAMS, PRESSURE MEASUREMENT, AND INFUSION CATHETER PLACEMENTS FOR PE THROMBO LYSIS COMPARISON:  None Available. MEDICATIONS: 1% lidocaine local ANESTHESIA/SEDATION: Moderate (conscious) sedation was employed during this procedure. A total of Versed  1.5 mg and Fentanyl 50 mcg was administered intravenously by the radiology nurse. Total intra-service moderate Sedation Time: The 40 minutes. The patient's level of consciousness and vital signs were monitored continuously by radiology nursing throughout the procedure under my direct supervision. FLUOROSCOPY: Radiation Exposure Index (as provided by the fluoroscopic device): 159 mGy Kerma COMPLICATIONS: None immediate. TECHNIQUE: Informed written consent was obtained from the patient after a thorough discussion of the procedural risks, benefits and alternatives. All questions were addressed. Maximal Sterile Barrier Technique was utilized including caps, mask, sterile gowns, sterile gloves, sterile drape, hand hygiene and skin antiseptic. A timeout was performed prior to the initiation of the procedure. Under sterile conditions and local  anesthesia, ultrasound micropuncture access performed the right common femoral vein twice. Adjacent 6 French sheath were inserted over Bentson guidewires into the right common femoral vein. Images obtained for documentation of the patent right common femoral vein. Five [place] PA catheters were advanced into the pulmonary arteries bilaterally utilizing both Bentson and 035 glide wires. Contrast injection performed for bilateral limited pulmonary angiography. Nearly occlusive central thrombus present on the right. More peripheral nonocclusive thrombus present on the left. Central PA pressure measurement: 42/15 (25). Over bilateral Rosen guidewires, the 90 cm infusion catheters with 10 cm infusion length were advanced into the pulmonary arteries. Internal occlusion wires advanced. Images obtained for documentation of catheter positions. Access secured with a silk suture. 12 hour thrombolysis protocol will be initiated through the bilateral pulmonary artery UniFuse catheters. FINDINGS: Imaging confirms right greater than left central pulmonary emboli better demonstrated by the comparison  CTA. Successful placement of bilateral 90 cm infusion catheters with 10 cm infusion leaks into both pulmonary arteries. IMPRESSION: Successful placement of bilateral pulmonary artery infusion catheters for PE thrombolysis 12 hour protocol. Electronically Signed   By: Judie Petit.  Shick M.D.   On: 10/14/2022 13:10   IR Angiogram Selective Each Additional Vessel  Result Date: 10/14/2022 INDICATION: Submassive saddle pulmonary embolus, right heart strain, chest discomfort despite heparin therapy EXAM: ULTRASOUND GUIDANCE FOR VASCULAR ACCESS BILATERAL PULMONARY ARTERY CATHETERIZATIONS, ANGIOGRAMS, PRESSURE MEASUREMENT, AND INFUSION CATHETER PLACEMENTS FOR PE THROMBO LYSIS COMPARISON:  None Available. MEDICATIONS: 1% lidocaine local ANESTHESIA/SEDATION: Moderate (conscious) sedation was employed during this procedure. A total of Versed 1.5 mg and Fentanyl 50 mcg was administered intravenously by the radiology nurse. Total intra-service moderate Sedation Time: The 40 minutes. The patient's level of consciousness and vital signs were monitored continuously by radiology nursing throughout the procedure under my direct supervision. FLUOROSCOPY: Radiation Exposure Index (as provided by the fluoroscopic device): 159 mGy Kerma COMPLICATIONS: None immediate. TECHNIQUE: Informed written consent was obtained from the patient after a thorough discussion of the procedural risks, benefits and alternatives. All questions were addressed. Maximal Sterile Barrier Technique was utilized including caps, mask, sterile gowns, sterile gloves, sterile drape, hand hygiene and skin antiseptic. A timeout was performed prior to the initiation of the procedure. Under sterile conditions and local anesthesia, ultrasound micropuncture access performed the right common femoral vein twice. Adjacent 6 French sheath were inserted over Bentson guidewires into the right common femoral vein. Images obtained for documentation of the patent right common femoral  vein. Five [place] PA catheters were advanced into the pulmonary arteries bilaterally utilizing both Bentson and 035 glide wires. Contrast injection performed for bilateral limited pulmonary angiography. Nearly occlusive central thrombus present on the right. More peripheral nonocclusive thrombus present on the left. Central PA pressure measurement: 42/15 (25). Over bilateral Rosen guidewires, the 90 cm infusion catheters with 10 cm infusion length were advanced into the pulmonary arteries. Internal occlusion wires advanced. Images obtained for documentation of catheter positions. Access secured with a silk suture. 12 hour thrombolysis protocol will be initiated through the bilateral pulmonary artery UniFuse catheters. FINDINGS: Imaging confirms right greater than left central pulmonary emboli better demonstrated by the comparison CTA. Successful placement of bilateral 90 cm infusion catheters with 10 cm infusion leaks into both pulmonary arteries. IMPRESSION: Successful placement of bilateral pulmonary artery infusion catheters for PE thrombolysis 12 hour protocol. Electronically Signed   By: Judie Petit.  Shick M.D.   On: 10/14/2022 13:10   CT Angio Chest Pulmonary Embolism (PE) W or WO Contrast  Addendum Date: 10/13/2022  ADDENDUM REPORT: 10/13/2022 15:59 ADDENDUM: Critical Value/emergent results were called by telephone at the time of interpretation on 10/13/2022 at 3:59 pm to provider [facility] , who verbally acknowledged these results. Electronically Signed   By: Amie Portland M.D.   On: 10/13/2022 15:59   Result Date: 10/13/2022 CLINICAL DATA:  Unstable angina. Inpatient. Clinical suspicion for pulmonary embolism. EXAM: CT ANGIOGRAPHY CHEST WITH CONTRAST TECHNIQUE: Multidetector CT imaging of the chest was performed using the standard protocol during bolus administration of intravenous contrast. Multiplanar CT image reconstructions and MIPs were obtained to evaluate the vascular anatomy. RADIATION DOSE REDUCTION:  This exam was performed according to the departmental dose-optimization program which includes automated exposure control, adjustment of the mA and/or kV according to patient size and/or use of iterative reconstruction technique. CONTRAST:  75mL OMNIPAQUE IOHEXOL 350 MG/ML SOLN COMPARISON:  01/25/2013.  Chest radiograph, 10/11/2022. FINDINGS: Cardiovascular:  Pulmonary arteries are well opacified. There are bilateral pulmonary emboli, greater on the right. A saddle embolus spans the right and left pulmonary arteries. Near occlusive emboli extend into the right lower lobe segmental pulmonary arteries. Nonocclusive emboli are noted in the right middle and upper lobe central pulmonary arteries. On the left nonocclusive pulmonary embolism extends from the saddle embolism into the central lingular branch pulmonary artery. RV LV ratio is 1.18. Heart normal in size. No pericardial effusion. Great vessels are normal in caliber. Mild aortic atherosclerosis. No dissection. Mediastinum/Nodes: Significant enlargement of the left thyroid lobe that extends substernally. This represents a notable change from the chest CT dated 01/25/2013. No other mediastinal masses. No hilar masses. No mediastinal or hilar adenopathy. Trachea and esophagus are unremarkable. Lungs/Pleura: Minor basilar subsegmental atelectasis. Lungs otherwise clear. No pleural effusion or pneumothorax. Upper Abdomen: No acute finding. Musculoskeletal: No fracture or acute finding. No bone lesion. No chest wall mass. Review of the MIP images confirms the above findings. IMPRESSION: 1. Positive for pulmonary emboli. Right greater than left pulmonary emboli with a saddle pulmonary embolus as detailed. Positive for acute PE with CT evidence of right heart strain (RV/LV Ratio = 1.18) consistent with at least submassive (intermediate risk) PE. The presence of right heart strain has been associated with an increased risk of morbidity and mortality. Please refer to the  "Code PE Focused" order set in EPIC. 2. No other acute finding. 3. Significant enlargement of the left thyroid lobe that extends substernally. Recommend thyroid ultrasound (ref: J Am Coll Radiol. 2015 Feb;12(2): 143-50). Aortic Atherosclerosis (ICD10-I70.0). Electronically Signed: By: Amie Portland M.D. On: 10/13/2022 15:50   CARDIAC CATHETERIZATION  Result Date: 10/13/2022 Images from the original result were not included. LM: Normal LAD: Minimal luminal irregularities Lcx: Large, dominant. Normal RCA: Small, nondominant.  Normal LVEDP normal Resting Pd/Pa: 0.93 FFR: 0.88 CFR: 4.5 IMR: 27 (only marginally abnormal) No angiographically evident significant coronary artery disease Nearly normal microvascular indicis Above findings do not explain the severity of patient's chest pain shortness of breath. I personally reviewed and compared echocardiograms.  Echocardiogram performed outpatient last week showed RVSP of 28 mmHg.  RVSP was noted to be 69 mmHg on inpatient echocardiogram yesterday.  Patient reportedly has history of PEs in the past. Given significant only different RVSP, patient's profound symptoms not explained by coronary or cardiac etiology, I strongly suspect acute pulmonary cause such as pulmonary embolism.  I personally discussed this with patient and patient's wife, as well as hospitalist Dr. Beverly Gust.  We will proceed with CT angiogram chest, with additional hydration.  If CT angiogram does  not reveal acute PE, I will perform right heart catheterization tomorrow. Elder Negus, MD Pager: [phone] Office: [phone]  ECHOCARDIOGRAM COMPLETE  Result Date: 10/12/2022    ECHOCARDIOGRAM REPORT   Patient Name:   AHMADOU BOLZ Date of Exam: 10/12/2022 Medical Rec #:  [id number]       Height:       69.5 in Accession #:    [id number]      Weight:       205.0 lb Date of Birth:  09-Feb-1941       BSA:          2.098 m Patient Age:    81 years        BP:           97/61 mmHg Patient Gender: M                HR:           90 bpm. Exam Location:  Inpatient Procedure: 2D Echo, Cardiac Doppler and Color Doppler Indications:    Chest pain R07.9  History:        Patient has prior history of Echocardiogram examinations, most                 recent 10/09/2022. CAD; Risk Factors:Hypertension, Dyslipidemia                 and Non-Smoker.  Sonographer:    Dondra Prader RVT RCS Referring Phys: [id number] SABINA CUSTOVIC IMPRESSIONS  1. Left ventricular ejection fraction, by estimation, is 65 to 70%. The left ventricle has normal function. The left ventricle has no regional wall motion abnormalities. Left ventricular diastolic parameters are consistent with Grade I diastolic dysfunction (impaired relaxation).  2. Right ventricular systolic function is normal. The right ventricular size is normal. There is severely elevated pulmonary artery systolic pressure. The estimated right ventricular systolic pressure is 69.2 mmHg.  3. The mitral valve is grossly normal. Mild mitral valve regurgitation. No evidence of mitral stenosis.  4. The tricuspid valve is abnormal. Tricuspid valve regurgitation is moderate.  5. The aortic valve is tricuspid. There is mild calcification of the aortic valve. Aortic valve regurgitation is mild. No aortic stenosis is present.  6. There is Moderate (Grade III) plaque involving the descending aorta.  7. The inferior vena cava is normal in size with <50% respiratory variability, suggesting right atrial pressure of 8 mmHg. Comparison(s): No prior Echocardiogram. FINDINGS  Left Ventricle: Left ventricular ejection fraction, by estimation, is 65 to 70%. The left ventricle has normal function. The left ventricle has no regional wall motion abnormalities. The left ventricular internal cavity size was normal in size. There is  no left ventricular hypertrophy. Left ventricular diastolic parameters are consistent with Grade I diastolic dysfunction (impaired relaxation). Right Ventricle: The right ventricular size  is normal. No increase in right ventricular wall thickness. Right ventricular systolic function is normal. There is severely elevated pulmonary artery systolic pressure. The tricuspid regurgitant velocity is 3.91 m/s, and with an assumed right atrial pressure of 8 mmHg, the estimated right ventricular systolic pressure is 69.2 mmHg. Left Atrium: Left atrial size was normal in size. Right Atrium: Right atrial size was normal in size. Pericardium: There is no evidence of pericardial effusion. Mitral Valve: The mitral valve is grossly normal. Mild mitral valve regurgitation. No evidence of mitral valve stenosis. Tricuspid Valve: The tricuspid valve is abnormal. Tricuspid valve regurgitation is moderate . No evidence of tricuspid stenosis. Aortic Valve: The aortic valve  is tricuspid. There is mild calcification of the aortic valve. Aortic valve regurgitation is mild. No aortic stenosis is present. Aortic valve mean gradient measures 3.0 mmHg. Aortic valve peak gradient measures 5.9 mmHg. Aortic valve area, by VTI measures 2.70 cm. Pulmonic Valve: The pulmonic valve was not well visualized. Pulmonic valve regurgitation is mild. No evidence of pulmonic stenosis. Aorta: The aortic root and ascending aorta are structurally normal, with no evidence of dilitation. There is moderate (Grade III) plaque involving the descending aorta. Venous: The inferior vena cava is normal in size with less than 50% respiratory variability, suggesting right atrial pressure of 8 mmHg. IAS/Shunts: No atrial level shunt detected by color flow Doppler.  LEFT VENTRICLE PLAX 2D LVIDd:         4.40 cm   Diastology LVIDs:         2.60 cm   LV e' medial:    7.88 cm/s LV PW:         0.90 cm   LV E/e' medial:  5.7 LV IVS:        0.90 cm   LV e' lateral:   8.15 cm/s LVOT diam:     2.10 cm   LV E/e' lateral: 5.5 LV SV:         53 LV SV Index:   25 LVOT Area:     3.46 cm  RIGHT VENTRICLE             IVC RV Basal diam:  3.30 cm     IVC diam: 1.60 cm RV Mid  diam:    3.20 cm RV S prime:     16.20 cm/s TAPSE (M-mode): 1.5 cm LEFT ATRIUM             Index        RIGHT ATRIUM           Index LA diam:        3.80 cm 1.81 cm/m   RA Area:     16.00 cm LA Vol (A2C):   60.2 ml 28.69 ml/m  RA Volume:   47.40 ml  22.59 ml/m LA Vol (A4C):   53.6 ml 25.55 ml/m LA Biplane Vol: 58.6 ml 27.93 ml/m  AORTIC VALVE                    PULMONIC VALVE AV Area (Vmax):    2.61 cm     PV Vmax:       0.74 m/s AV Area (Vmean):   2.49 cm     PV Peak grad:  2.2 mmHg AV Area (VTI):     2.70 cm AV Vmax:           121.50 cm/s AV Vmean:          80.600 cm/s AV VTI:            0.198 m AV Peak Grad:      5.9 mmHg AV Mean Grad:      3.0 mmHg LVOT Vmax:         91.53 cm/s LVOT Vmean:        57.867 cm/s LVOT VTI:          0.154 m LVOT/AV VTI ratio: 0.78 AR Vena Contracta: 0.40 cm  AORTA Ao Root diam: 3.40 cm Ao Asc diam:  3.30 cm Ao Arch diam: 3.3 cm MITRAL VALVE               TRICUSPID VALVE MV Area (PHT): 2.77  cm    TR Peak grad:   61.2 mmHg MV Decel Time: 274 msec    TR Vmax:        391.00 cm/s MV E velocity: 45.10 cm/s MV A velocity: 57.10 cm/s  SHUNTS MV E/A ratio:  0.79        Systemic VTI:  0.15 m                            Systemic Diam: 2.10 cm Vishnu Priya Mallipeddi Electronically signed by Winfield Rast Mallipeddi Signature Date/Time: 10/12/2022/3:05:20 PM    Final    DG Chest 2 View  Result Date: 10/11/2022 CLINICAL DATA:  Chest EXAM: CHEST - 2 VIEW COMPARISON:  CXR 01/25/13 FINDINGS: No pleural effusion. No pneumothorax. No focal airspace opacity. Normal cardiac and mediastinal contours. No radiographically apparent displaced rib fractures. Visualized upper abdomen is unremarkable. Degenerative changes of the left AC joint and right glenohumeral joint. IMPRESSION: No focal airspace opacity. Electronically Signed   By: Lorenza Cambridge M.D.   On: 10/11/2022 18:53   PCV ECHOCARDIOGRAM COMPLETE  Result Date: 10/09/2022 Echocardiogram 10/08/2022: Normal LV systolic function with visual  EF 60-65%. Left ventricle cavity is normal in size. Normal left ventricular wall thickness. Normal global wall motion. Normal diastolic filling pattern, normal LAP. Calculated EF 70%. Left atrial cavity is slightly dilated. Trileaflet aortic valve.  Moderate (Grade III) aortic regurgitation. Structurally normal mitral valve.  Mild (Grade I) mitral regurgitation. Structurally normal tricuspid valve.  Mild tricuspid regurgitation. Structurally normal pulmonic valve.  Mild pulmonic regurgitation. No significant change compared to 03/2022.     Discharge Exam: Vitals:   10/27/22 0924 10/27/22 1126  BP: 110/79 132/80  Pulse: 96 (!) 101  Resp: 19 14  Temp:  97.7 F (36.5 C)  SpO2: 98% 100%   Vitals:   10/27/22 0713 10/27/22 0749 10/27/22 0924 10/27/22 1126  BP: 133/74 133/63 110/79 132/80  Pulse: 92 92 96 (!) 101  Resp: 20 18 19 14   Temp: 98.4 F (36.9 C)   97.7 F (36.5 C)  TempSrc: Oral   Oral  SpO2:  95% 98% 100%  Weight:      Height:        General: Pt is alert, awake, not in acute distress Cardiovascular: RRR, S1/S2 +, no rubs, no gallops Respiratory: CTA bilaterally, no wheezing, no rhonchi Abdominal: Soft, NT, ND, bowel sounds + Extremities: Ecchymosis right upper extremity.  Wound VAC attached.    The results of significant diagnostics from this hospitalization (including imaging, microbiology, ancillary and laboratory) are listed below for reference.     Microbiology: No results found for this or any previous visit (from the past 240 hour(s)).   Labs: BNP (last 3 results) Recent Labs    10/14/22 1252  BNP 39.8   Basic Metabolic Panel: Recent Labs  Lab 10/21/22 0335 10/22/22 0718 10/23/22 0128 10/24/22 0202  NA 133* 134* 133* 134*  K 4.7 4.4 4.9 4.3  CL 101 101 99 102  CO2 25 25 24 25   GLUCOSE 134* 126* 126* 136*  BUN 28* 31* 31* 34*  CREATININE 1.05 1.08 1.11 1.12  CALCIUM 8.5* 8.6* 8.7* 8.8*  MG 2.2 2.3  --   --    Liver Function Tests: No results  for input(s): "AST", "ALT", "ALKPHOS", "BILITOT", "PROT", "ALBUMIN" in the last 168 hours. No results for input(s): "LIPASE", "AMYLASE" in the last 168 hours. No results for input(s): "AMMONIA" in the last  168 hours. CBC: Recent Labs  Lab 10/21/22 0335 10/22/22 0718 10/23/22 0128 10/24/22 0202 10/25/22 0100 10/25/22 1131  WBC 12.4* 11.1* 11.4* 11.4* 9.7  --   NEUTROABS  --   --  8.9*  --   --   --   HGB 7.6* 7.5* 8.1* 7.4* 7.1* 8.1*  HCT 23.5* 22.9* 25.3* 23.2* 22.5* 25.4*  MCV 93.6 92.7 93.7 94.3 94.1  --   PLT 202 229 253 304 311  --    Cardiac Enzymes: No results for input(s): "CKTOTAL", "CKMB", "CKMBINDEX", "TROPONINI" in the last 168 hours. BNP: Invalid input(s): "POCBNP" CBG: Recent Labs  Lab 10/26/22 1137 10/26/22 1544 10/26/22 2050 10/27/22 0612 10/27/22 1134  GLUCAP 121* 168* 122* 126* 133*   D-Dimer No results for input(s): "DDIMER" in the last 72 hours. Hgb A1c No results for input(s): "HGBA1C" in the last 72 hours. Lipid Profile No results for input(s): "CHOL", "HDL", "LDLCALC", "TRIG", "CHOLHDL", "LDLDIRECT" in the last 72 hours. Thyroid function studies No results for input(s): "TSH", "T4TOTAL", "T3FREE", "THYROIDAB" in the last 72 hours.  Invalid input(s): "FREET3" Anemia work up No results for input(s): "VITAMINB12", "FOLATE", "FERRITIN", "TIBC", "IRON", "RETICCTPCT" in the last 72 hours. Urinalysis    Component Value Date/Time   COLORURINE YELLOW 01/03/2008 2359   APPEARANCEUR CLEAR 01/03/2008 2359   LABSPEC 1.010 01/03/2008 2359   PHURINE 5.5 01/03/2008 2359   GLUCOSEU NEGATIVE 01/03/2008 2359   HGBUR NEGATIVE 01/03/2008 2359   BILIRUBINUR NEGATIVE 01/03/2008 2359   KETONESUR NEGATIVE 01/03/2008 2359   PROTEINUR NEGATIVE 01/03/2008 2359   UROBILINOGEN 0.2 01/03/2008 2359   NITRITE NEGATIVE 01/03/2008 2359   LEUKOCYTESUR TRACE (A) 01/03/2008 2359   Sepsis Labs Recent Labs  Lab 10/22/22 0718 10/23/22 0128 10/24/22 0202 10/25/22 0100   WBC 11.1* 11.4* 11.4* 9.7   Microbiology No results found for this or any previous visit (from the past 240 hour(s)).   Time coordinating discharge: Over 30 minutes  SIGNED:   Hughie Closs, MD  [facility] 10/27/2022, 11:42 AM *Please note that this is a verbal dictation therefore any spelling or grammatical errors are due to the "Dragon Medical One" system interpretation. If 7PM-7AM, please contact night-coverage [email or web address]

## 2022-10-30 ENCOUNTER — Telehealth: Payer: Self-pay

## 2022-10-30 ENCOUNTER — Ambulatory Visit: Payer: PPO | Admitting: Cardiology

## 2022-10-30 NOTE — Telephone Encounter (Signed)
Pt's wife, Dois Davenport, called with concerns that [facility] had not yet come to the home for evaluation, PT, and OT. She stated that they were told that he needed to be seen within the first 48 hours and it was beyond that at this point. She stated that there had been multiple phone calls and she didn't know what to do. She seemed to feel that he was not being taken care of properly.  Susie Cassette, [facility] area manager, had just been at the office and informed this office of some back and forth with this pt and his wife. She stated that the agency called last night and set up an appt for 0830 today at the pt's home. The pt called [facility] at approx 0825 telling them he did not want them to come. He seemed irate and frustrated about his care and the wound vac. Bjorn Loser spoke with the pt several times today and she reassured him that someone would be at his home tomorrow morning at 0800, if she was unable to get someone out there this afternoon. She even offered to help the pt with getting another [facility] agency if he was unhappy. After agreeing to continue, the wife wrote a poor review.  Called pt, two identifiers used. Pt does not have a DPR, so no information was given to the wife that answered the phone. Informed pt that Bjorn Loser had come by the office and informed us about what happened with trying to get him seen today. Reviewed the events of today and the plan for the RN to return tomorrow morning. Pt confirmed and understood. Reviewed pt's next appt and inquired how he was doing. He stated that he was doing very well, no issues, but was doing his own PT TID. Reinforced the helpfulness of the [facility] agency in the healing process and POC.

## 2022-10-31 ENCOUNTER — Telehealth: Payer: Self-pay

## 2022-10-31 DIAGNOSIS — E782 Mixed hyperlipidemia: Secondary | ICD-10-CM | POA: Diagnosis not present

## 2022-10-31 DIAGNOSIS — I2602 Saddle embolus of pulmonary artery with acute cor pulmonale: Secondary | ICD-10-CM | POA: Diagnosis not present

## 2022-10-31 DIAGNOSIS — I25118 Atherosclerotic heart disease of native coronary artery with other forms of angina pectoris: Secondary | ICD-10-CM | POA: Diagnosis not present

## 2022-10-31 DIAGNOSIS — I10 Essential (primary) hypertension: Secondary | ICD-10-CM | POA: Diagnosis not present

## 2022-10-31 DIAGNOSIS — I77811 Abdominal aortic ectasia: Secondary | ICD-10-CM | POA: Diagnosis not present

## 2022-10-31 DIAGNOSIS — D696 Thrombocytopenia, unspecified: Secondary | ICD-10-CM | POA: Diagnosis not present

## 2022-10-31 DIAGNOSIS — Z4801 Encounter for change or removal of surgical wound dressing: Secondary | ICD-10-CM | POA: Diagnosis not present

## 2022-10-31 DIAGNOSIS — I252 Old myocardial infarction: Secondary | ICD-10-CM | POA: Diagnosis not present

## 2022-10-31 DIAGNOSIS — I480 Paroxysmal atrial fibrillation: Secondary | ICD-10-CM | POA: Diagnosis not present

## 2022-10-31 DIAGNOSIS — I723 Aneurysm of iliac artery: Secondary | ICD-10-CM | POA: Diagnosis not present

## 2022-10-31 DIAGNOSIS — M79A11 Nontraumatic compartment syndrome of right upper extremity: Secondary | ICD-10-CM | POA: Diagnosis not present

## 2022-10-31 DIAGNOSIS — I214 Non-ST elevation (NSTEMI) myocardial infarction: Secondary | ICD-10-CM | POA: Diagnosis not present

## 2022-10-31 DIAGNOSIS — I351 Nonrheumatic aortic (valve) insufficiency: Secondary | ICD-10-CM | POA: Diagnosis not present

## 2022-10-31 DIAGNOSIS — T45615D Adverse effect of thrombolytic drugs, subsequent encounter: Secondary | ICD-10-CM | POA: Diagnosis not present

## 2022-10-31 DIAGNOSIS — D62 Acute posthemorrhagic anemia: Secondary | ICD-10-CM | POA: Diagnosis not present

## 2022-10-31 DIAGNOSIS — L7632 Postprocedural hematoma of skin and subcutaneous tissue following other procedure: Secondary | ICD-10-CM | POA: Diagnosis not present

## 2022-10-31 NOTE — Telephone Encounter (Signed)
Arline Asp, RN with [phone] [facility] called to clarify if the wound vac needed any changes or if it would be changed in the office at pt's visit on Tuesday.  Reviewed pt's chart, returned call for clarification, two identifiers used. Confirmed that it would be changed in the office, so they didn't need to change it between now and then. She gave the pt instructions on wet-to-dry dressings, should the wound vac have any issues. Confirmed understanding.

## 2022-11-03 NOTE — Progress Notes (Unsigned)
POST OPERATIVE OFFICE NOTE    CC:  F/u for surgery  HPI:  This is a 82 y.o. male that underwent heart catheterization through a right transradial approach on 10/13/2022. 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 Vascular surgery was consulted. 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 He presents for operative intervention.   He is s/p right FA fasciotomy with CTR by Dr. Frazier Butt, right radial thrombectomy, right radial artery psa repair and vac placement on 10/15/2022 by Dr. Chestine Spore.    He was taken back to the OR on 10/17/2022 for washout and vac placement by Dr. Chestine Spore.  He went back to there OR on 10/20/2022 for washout, placement  of blake brain and vac placement with myriad morsels by Dr. Chestine Spore.  He was discharged home with vac and scheduled for return visit for vac change.    Pt returns today for follow up.  Pt states ***   No Known Allergies  Current Outpatient Medications  Medication Sig Dispense Refill   allopurinol (ZYLOPRIM) 300 MG tablet Take 150 mg by mouth daily.      apixaban (ELIQUIS) 5 MG TABS tablet Take 1 tablet (5 mg total) by mouth 2 (two) times daily. 60 tablet 0   ascorbic acid (VITAMIN C) 500 MG tablet Take 500 mg by mouth daily.     aspirin EC 81 MG tablet Take 1 tablet (81 mg total) by mouth daily. Swallow whole. 30 tablet 12   Cholecalciferol (VITAMIN D3) 5000 units CAPS Take 2,000 Units by mouth daily with supper.      finasteride (PROSCAR) 5 MG tablet Take 5 mg by mouth at bedtime.      fluticasone (FLONASE) 50 MCG/ACT nasal spray Place 1 spray into both nostrils every evening.     magnesium gluconate (MAGONATE) 500 MG tablet Take 500 mg by mouth daily.     metoprolol tartrate (LOPRESSOR) 25 MG tablet Take 0.5 tablets (12.5 mg total) by mouth 2 (two) times daily. 30 tablet 0   nitroGLYCERIN (NITROSTAT) 0.4 MG SL tablet  Place 0.4 mg under the tongue every 5 (five) minutes as needed for chest pain.     Omega-3 Fatty Acids (FISH OIL) 1000 MG CAPS Take 1,000 mg by mouth daily.     omeprazole (PRILOSEC) 40 MG capsule Take 1 capsule (40 mg total) by mouth daily. 90 capsule 3   oxyCODONE (OXY IR/ROXICODONE) 5 MG immediate release tablet Take 1 tablet (5 mg total) by mouth every 4 (four) hours as needed for moderate pain or severe pain. 20 tablet 0   rosuvastatin (CRESTOR) 40 MG tablet Take 1 tablet (40 mg total) by mouth daily. 30 tablet 0   zinc gluconate 50 MG tablet Take 50 mg by mouth daily.     No current facility-administered medications for this visit.     ROS:  See HPI  Physical Exam:  ***  Incision:  *** Extremities:  *** Neuro: *** Abdomen:  ***    Assessment/Plan:  This is a 82 y.o. male who is s/p:  right FA fasciotomy with CTR by Dr. Frazier Butt, right radial thrombectomy, right radial artery psa repair and vac placement on 10/15/2022 by Dr. Chestine Spore.    He was taken back to the OR on 10/17/2022 for washout and vac placement by Dr. Chestine Spore.  He went back to there OR on 10/20/2022 for washout, placement  of blake brain and  vac placement with myriad morsels by Dr. Chestine Spore.   -***   Ralph Blackwell, [facility] [phone]   Clinic MD:  Chestine Spore

## 2022-11-04 ENCOUNTER — Encounter: Payer: Self-pay | Admitting: Physician Assistant

## 2022-11-04 ENCOUNTER — Ambulatory Visit (INDEPENDENT_AMBULATORY_CARE_PROVIDER_SITE_OTHER): Payer: PPO | Admitting: Physician Assistant

## 2022-11-04 VITALS — BP 116/73 | HR 80 | Temp 98.2°F | Resp 16 | Ht 69.0 in | Wt 199.0 lb

## 2022-11-04 DIAGNOSIS — Z9889 Other specified postprocedural states: Secondary | ICD-10-CM

## 2022-11-04 DIAGNOSIS — S55101A Unspecified injury of radial artery at forearm level, right arm, initial encounter: Secondary | ICD-10-CM

## 2022-11-10 ENCOUNTER — Other Ambulatory Visit: Payer: Self-pay

## 2022-11-10 DIAGNOSIS — Z9889 Other specified postprocedural states: Secondary | ICD-10-CM

## 2022-11-12 DIAGNOSIS — I2602 Saddle embolus of pulmonary artery with acute cor pulmonale: Secondary | ICD-10-CM | POA: Diagnosis not present

## 2022-11-12 DIAGNOSIS — L7632 Postprocedural hematoma of skin and subcutaneous tissue following other procedure: Secondary | ICD-10-CM | POA: Diagnosis not present

## 2022-11-12 DIAGNOSIS — I351 Nonrheumatic aortic (valve) insufficiency: Secondary | ICD-10-CM | POA: Diagnosis not present

## 2022-11-12 DIAGNOSIS — I10 Essential (primary) hypertension: Secondary | ICD-10-CM | POA: Diagnosis not present

## 2022-11-12 DIAGNOSIS — I25118 Atherosclerotic heart disease of native coronary artery with other forms of angina pectoris: Secondary | ICD-10-CM | POA: Diagnosis not present

## 2022-11-12 DIAGNOSIS — T45615D Adverse effect of thrombolytic drugs, subsequent encounter: Secondary | ICD-10-CM | POA: Diagnosis not present

## 2022-11-12 DIAGNOSIS — D62 Acute posthemorrhagic anemia: Secondary | ICD-10-CM | POA: Diagnosis not present

## 2022-11-12 DIAGNOSIS — I214 Non-ST elevation (NSTEMI) myocardial infarction: Secondary | ICD-10-CM | POA: Diagnosis not present

## 2022-11-12 DIAGNOSIS — D696 Thrombocytopenia, unspecified: Secondary | ICD-10-CM | POA: Diagnosis not present

## 2022-11-12 DIAGNOSIS — M79A11 Nontraumatic compartment syndrome of right upper extremity: Secondary | ICD-10-CM | POA: Diagnosis not present

## 2022-11-12 NOTE — Progress Notes (Unsigned)
Cardiology Office Note:    Date:  11/13/2022   ID:  Ralph Blackwell, DOB January 26, 1941, MRN [id number]  PCP:  Joya Martyr, MD   [facility] Providers Cardiologist:  Christell Constant, MD     Referring MD: No ref. provider found   Follow up PE  History of Present Illness:    Ralph Blackwell is a 82 y.o. male with a hx of HTN, HLD, CAD non obstructive in 2018 without prior intervention was admitted 10/11/22.  Has hx of PE; he was found to have large saddle PE.  He is s/p EKOS complicated by pseudoaneurysm.  Patient notes that he is doing well.   Since last visit notes that he is recovering; he arm looks better (his wife has brought wound vac off pictures) and he is gaining strength back.  Saw with Vascular and was minimal LE edema; this has waxed and waned and is not present today.    No chest pain or pressure .  No SOB/DOE and no PND/Orthopnea.  No weight gain or leg swelling.  No palpitations or syncope .   Past Medical History:  Diagnosis Date   Coronary artery disease    GERD (gastroesophageal reflux disease)    Gout    History of hiatal hernia    Hypertension    Pulmonary embolism (HCC) 2008 after knee surgey   Renal insufficiency    hx renal failure 2008 after blood clots   Sleep apnea     Past Surgical History:  Procedure Laterality Date   APPLICATION OF WOUND VAC Right 10/20/2022   Procedure: WOUND VAC EXCHANGE;  Surgeon: Cephus Shelling, MD;  Location: [facility] OR;  Service: Vascular;  Laterality: Right;   arthroscopic knee surgery  2004   BACK SURGERY  nov 1979 and 1998   lower   COLONOSCOPY WITH PROPOFOL N/A 10/20/2016   Procedure: COLONOSCOPY WITH PROPOFOL;  Surgeon: Charolett Bumpers, MD;  Location: [facility];  Service: Endoscopy;  Laterality: N/A;   FASCIOTOMY Right 10/15/2022   Procedure: RIGHT FOREARM FASCIOTOMY, RADIAL ARTERY REPAIR, AND RADIAL ARTERY THROMBECOTOMY;  Surgeon: Marlyne Beards, MD;  Location: [facility] OR;  Service: Orthopedics;   Laterality: Right;   I & D EXTREMITY Right 10/17/2022   Procedure: IRRIGATION AND DEBRIDEMENT RIGHT ARM & WOUND VAC CHANGE;  Surgeon: Cephus Shelling, MD;  Location: [facility] OR;  Service: Vascular;  Laterality: Right;   I & D EXTREMITY Right 10/22/2022   Procedure: REPEAT IRRIGATION AND DEBRIDEMENT RIGHT ARM WITH WOUND VAC CHANGE WITH MYRIAD PLACEMENT;  Surgeon: Cephus Shelling, MD;  Location: [facility] OR;  Service: Vascular;  Laterality: Right;   INCISION AND DRAINAGE OF WOUND Right 10/20/2022   Procedure: IRRIGATION AND DEBRIDEMENT SKIN, SUBCUTANEOUS TISSUE AND MUSCLE WITH MYRIAD MORSELS AND DRAIN PLACEMENT;  Surgeon: Cephus Shelling, MD;  Location: [facility] OR;  Service: Vascular;  Laterality: Right;   IR ANGIOGRAM PULMONARY BILATERAL SELECTIVE  10/14/2022   IR ANGIOGRAM SELECTIVE EACH ADDITIONAL VESSEL  10/14/2022   IR ANGIOGRAM SELECTIVE EACH ADDITIONAL VESSEL  10/14/2022   IR INFUSION THROMBOL ARTERIAL INITIAL (MS)  10/14/2022   IR INFUSION THROMBOL ARTERIAL INITIAL (MS)  10/14/2022   IR THROMB F/U EVAL ART/VEN FINAL DAY (MS)  10/15/2022   IR US GUIDE VASC ACCESS RIGHT  10/14/2022   JOINT REPLACEMENT Left 2007   knee    LEFT HEART CATH AND CORONARY ANGIOGRAPHY N/A 06/30/2017   Procedure: LEFT HEART CATH AND CORONARY ANGIOGRAPHY;  Surgeon: Elder Negus,  MD;  Location: [facility];  Service: Cardiovascular;  Laterality: N/A;   LEFT HEART CATH AND CORONARY ANGIOGRAPHY N/A 10/13/2022   Procedure: LEFT HEART CATH AND CORONARY ANGIOGRAPHY;  Surgeon: Elder Negus, MD;  Location: [facility] IN[facility] CV [facility];  Service: Cardiovascular;  Laterality: N/A;   left wrist plating   2005   titanium plate   right total knee replacement Right 2009    Current Medications: Current Meds  Medication Sig   allopurinol (ZYLOPRIM) 300 MG tablet Take 150 mg by mouth daily.    apixaban (ELIQUIS) 5 MG TABS tablet Take 1 tablet (5 mg total) by mouth 2 (two) times daily.   ascorbic acid (VITAMIN C) 500 MG tablet Take  500 mg by mouth daily.   Cholecalciferol (VITAMIN D3) 5000 units CAPS Take 2,000 Units by mouth daily with supper.    finasteride (PROSCAR) 5 MG tablet Take 5 mg by mouth at bedtime.    fluticasone (FLONASE) 50 MCG/ACT nasal spray Place 1 spray into both nostrils every evening.   magnesium gluconate (MAGONATE) 500 MG tablet Take 500 mg by mouth daily.   metoprolol tartrate (LOPRESSOR) 25 MG tablet Take 0.5 tablets (12.5 mg total) by mouth 2 (two) times daily.   nitroGLYCERIN (NITROSTAT) 0.4 MG SL tablet Place 0.4 mg under the tongue every 5 (five) minutes as needed for chest pain.   Omega-3 Fatty Acids (FISH OIL) 1000 MG CAPS Take 1,000 mg by mouth daily.   omeprazole (PRILOSEC) 40 MG capsule Take 1 capsule (40 mg total) by mouth daily.   rosuvastatin (CRESTOR) 40 MG tablet Take 1 tablet (40 mg total) by mouth daily.   zinc gluconate 50 MG tablet Take 50 mg by mouth daily.   [DISCONTINUED] aspirin EC 81 MG tablet Take 1 tablet (81 mg total) by mouth daily. Swallow whole.     Allergies:   Patient has no known allergies.   Social History   Socioeconomic History   Marital status: Married    Spouse name: Not on file   Number of children: 2   Years of education: Not on file   Highest education level: Not on file  Occupational History   Not on file  Tobacco Use   Smoking status: Never   Smokeless tobacco: Never  Vaping Use   Vaping Use: Never used  Substance and Sexual Activity   Alcohol use: No   Drug use: No   Sexual activity: Not on file  Other Topics Concern   Not on file  Social History Narrative   Not on file   Social Determinants of Health   Financial Resource Strain: Not on file  Food Insecurity: No Food Insecurity (10/13/2022)   Hunger Vital Sign    Worried About Running Out of Food in the Last Year: Never true    Ran Out of Food in the Last Year: Never true  Transportation Needs: No Transportation Needs (10/13/2022)   PRAPARE - Administrator, Civil Service  (Medical): No    Lack of Transportation (Non-Medical): No  Physical Activity: Not on file  Stress: Not on file  Social Connections: Not on file     Family History: The patient's family history includes Heart attack in his father; Heart disease in his father and mother; Hypertension in his father and mother.  ROS:   Please see the history of present illness.     All other systems reviewed and are negative.  EKGs/Labs/Other Studies Reviewed:    The following  studies were reviewed today:  EKG:  EKG is  ordered today.  The ekg ordered today demonstrates  11/13/22: Sinus rhythm 82   Cardiac Studies & Procedures   CARDIAC CATHETERIZATION  CARDIAC CATHETERIZATION 10/13/2022  Narrative Images from the original result were not included. LM: Normal LAD: Minimal luminal irregularities Lcx: Large, dominant. Normal RCA: Small, nondominant.  Normal  LVEDP normal  Resting Pd/Pa: 0.93 FFR: 0.88 CFR: 4.5 IMR: 27 (only marginally abnormal)  No angiographically evident significant coronary artery disease Nearly normal microvascular indicis  Above findings do not explain the severity of patient's chest pain shortness of breath.  I personally reviewed and compared echocardiograms.  Echocardiogram performed outpatient last week showed RVSP of 28 mmHg.  RVSP was noted to be 69 mmHg on inpatient echocardiogram yesterday.  Patient reportedly has history of PEs in the past.  Given significant only different RVSP, patient's profound symptoms not explained by coronary or cardiac etiology, I strongly suspect acute pulmonary cause such as pulmonary embolism.  I personally discussed this with patient and patient's wife, as well as hospitalist Dr. Beverly Gust.  We will proceed with CT angiogram chest, with additional hydration.  If CT angiogram does not reveal acute PE, I will perform right heart catheterization tomorrow.   Elder Negus, MD Pager: [phone] Office:  [phone]  Findings Coronary Findings Diagnostic  Dominance: Left  Left Main Vessel is normal in caliber. Vessel is angiographically normal.  Left Anterior Descending Vessel is normal in caliber. The vessel exhibits minimal luminal irregularities.  Left Circumflex Vessel is normal in caliber. Vessel is angiographically normal.  Right Coronary Artery Vessel is small. Vessel is angiographically normal.  Intervention  No interventions have been documented.   CARDIAC CATHETERIZATION  CARDIAC CATHETERIZATION 06/30/2017  Narrative  The left ventricular ejection fraction is 55-65% by visual estimate.  Mild nonobstructive coronary artery disease (Prox LAD 30%) with sluggish flow in all vessels. Normal filling pressures.  Recommendations: Continue medical management.  Elder Negus, MD [facility] Pager: [phone] Office: [phone] If no answer Cell [phone]  Findings Coronary Findings Diagnostic  Dominance: Left  Left Anterior Descending Ost LAD to Prox LAD lesion is 30% stenosed.  Intervention  No interventions have been documented.     ECHOCARDIOGRAM  ECHOCARDIOGRAM COMPLETE 10/12/2022  Narrative ECHOCARDIOGRAM REPORT    Patient Name:   Ralph Blackwell Date of Exam: 10/12/2022 Medical Rec #:  [id number]       Height:       69.5 in Accession #:    [id number]      Weight:       205.0 lb Date of Birth:  1940/07/16       BSA:          2.098 m Patient Age:    81 years        BP:           97/61 mmHg Patient Gender: M               HR:           90 bpm. Exam Location:  Inpatient  Procedure: 2D Echo, Cardiac Doppler and Color Doppler  Indications:    Chest pain R07.9  History:        Patient has prior history of Echocardiogram examinations, most recent 10/09/2022. CAD; Risk Factors:Hypertension, Dyslipidemia and Non-Smoker.  Sonographer:    Dondra Prader RVT RCS Referring Phys: [id number] SABINA  CUSTOVIC  IMPRESSIONS   1. Left ventricular ejection fraction, by estimation,  is 65 to 70%. The left ventricle has normal function. The left ventricle has no regional wall motion abnormalities. Left ventricular diastolic parameters are consistent with Grade I diastolic dysfunction (impaired relaxation). 2. Right ventricular systolic function is normal. The right ventricular size is normal. There is severely elevated pulmonary artery systolic pressure. The estimated right ventricular systolic pressure is 69.2 mmHg. 3. The mitral valve is grossly normal. Mild mitral valve regurgitation. No evidence of mitral stenosis. 4. The tricuspid valve is abnormal. Tricuspid valve regurgitation is moderate. 5. The aortic valve is tricuspid. There is mild calcification of the aortic valve. Aortic valve regurgitation is mild. No aortic stenosis is present. 6. There is Moderate (Grade III) plaque involving the descending aorta. 7. The inferior vena cava is normal in size with <50% respiratory variability, suggesting right atrial pressure of 8 mmHg.  Comparison(s): No prior Echocardiogram.  FINDINGS Left Ventricle: Left ventricular ejection fraction, by estimation, is 65 to 70%. The left ventricle has normal function. The left ventricle has no regional wall motion abnormalities. The left ventricular internal cavity size was normal in size. There is no left ventricular hypertrophy. Left ventricular diastolic parameters are consistent with Grade I diastolic dysfunction (impaired relaxation).  Right Ventricle: The right ventricular size is normal. No increase in right ventricular wall thickness. Right ventricular systolic function is normal. There is severely elevated pulmonary artery systolic pressure. The tricuspid regurgitant velocity is 3.91 m/s, and with an assumed right atrial pressure of 8 mmHg, the estimated right ventricular systolic pressure is 69.2 mmHg.  Left Atrium: Left atrial size was normal in  size.  Right Atrium: Right atrial size was normal in size.  Pericardium: There is no evidence of pericardial effusion.  Mitral Valve: The mitral valve is grossly normal. Mild mitral valve regurgitation. No evidence of mitral valve stenosis.  Tricuspid Valve: The tricuspid valve is abnormal. Tricuspid valve regurgitation is moderate . No evidence of tricuspid stenosis.  Aortic Valve: The aortic valve is tricuspid. There is mild calcification of the aortic valve. Aortic valve regurgitation is mild. No aortic stenosis is present. Aortic valve mean gradient measures 3.0 mmHg. Aortic valve peak gradient measures 5.9 mmHg. Aortic valve area, by VTI measures 2.70 cm.  Pulmonic Valve: The pulmonic valve was not well visualized. Pulmonic valve regurgitation is mild. No evidence of pulmonic stenosis.  Aorta: The aortic root and ascending aorta are structurally normal, with no evidence of dilitation. There is moderate (Grade III) plaque involving the descending aorta.  Venous: The inferior vena cava is normal in size with less than 50% respiratory variability, suggesting right atrial pressure of 8 mmHg.  IAS/Shunts: No atrial level shunt detected by color flow Doppler.   LEFT VENTRICLE PLAX 2D LVIDd:         4.40 cm   Diastology LVIDs:         2.60 cm   LV e' medial:    7.88 cm/s LV PW:         0.90 cm   LV E/e' medial:  5.7 LV IVS:        0.90 cm   LV e' lateral:   8.15 cm/s LVOT diam:     2.10 cm   LV E/e' lateral: 5.5 LV SV:         53 LV SV Index:   25 LVOT Area:     3.46 cm   RIGHT VENTRICLE             IVC RV Basal diam:  3.30  cm     IVC diam: 1.60 cm RV Mid diam:    3.20 cm RV S prime:     16.20 cm/s TAPSE (M-mode): 1.5 cm  LEFT ATRIUM             Index        RIGHT ATRIUM           Index LA diam:        3.80 cm 1.81 cm/m   RA Area:     16.00 cm LA Vol (A2C):   60.2 ml 28.69 ml/m  RA Volume:   47.40 ml  22.59 ml/m LA Vol (A4C):   53.6 ml 25.55 ml/m LA Biplane Vol: 58.6  ml 27.93 ml/m AORTIC VALVE                    PULMONIC VALVE AV Area (Vmax):    2.61 cm     PV Vmax:       0.74 m/s AV Area (Vmean):   2.49 cm     PV Peak grad:  2.2 mmHg AV Area (VTI):     2.70 cm AV Vmax:           121.50 cm/s AV Vmean:          80.600 cm/s AV VTI:            0.198 m AV Peak Grad:      5.9 mmHg AV Mean Grad:      3.0 mmHg LVOT Vmax:         91.53 cm/s LVOT Vmean:        57.867 cm/s LVOT VTI:          0.154 m LVOT/AV VTI ratio: 0.78 AR Vena Contracta: 0.40 cm  AORTA Ao Root diam: 3.40 cm Ao Asc diam:  3.30 cm Ao Arch diam: 3.3 cm  MITRAL VALVE               TRICUSPID VALVE MV Area (PHT): 2.77 cm    TR Peak grad:   61.2 mmHg MV Decel Time: 274 msec    TR Vmax:        391.00 cm/s MV E velocity: 45.10 cm/s MV A velocity: 57.10 cm/s  SHUNTS MV E/A ratio:  0.79        Systemic VTI:  0.15 m Systemic Diam: 2.10 cm  Vishnu Priya Mallipeddi Electronically signed by Winfield Rast Mallipeddi Signature Date/Time: 10/12/2022/3:05:20 PM    Final               Recent Labs: 10/11/2022: ALT 24 10/14/2022: B Natriuretic Peptide 39.8 10/22/2022: Magnesium 2.3 10/24/2022: BUN 34; Creatinine, Ser 1.12; Potassium 4.3; Sodium 134 10/25/2022: Hemoglobin 8.1; Platelets 311  Recent Lipid Panel No results found for: "CHOL", "TRIG", "HDL", "CHOLHDL", "VLDL", "LDLCALC", "LDLDIRECT"    Physical Exam:    VS:  BP (!) 120/58   Pulse 82   Ht 5\' 9"  (1.753 m)   Wt 202 lb (91.6 kg)   SpO2 96%   BMI 29.83 kg/m     Wt Readings from Last 3 Encounters:  11/13/22 202 lb (91.6 kg)  11/04/22 199 lb (90.3 kg)  10/23/22 200 lb 13.4 oz (91.1 kg)     GEN:  Well nourished, well developed in no acute distress HEENT: Normal NECK: No JVD CARDIAC: RRR, no murmurs, rubs, gallops RESPIRATORY:  Clear to auscultation without rales, wheezing or rhonchi  ABDOMEN: Soft, non-tender, non-distended MUSCULOSKELETAL:  No edema; left hand and arm  have notable improved since March NEUROLOGIC:   Alert and oriented x 3 PSYCHIATRIC:  Normal affect   ASSESSMENT:    1. Nonrheumatic aortic valve insufficiency   2. Essential hypertension   3. PAF (paroxysmal atrial fibrillation) (HCC)   4. Acute saddle pulmonary embolism with acute cor pulmonale (HCC)    PLAN:    PAF  Prior history of PE Recent submassive PE with R femoral hematoma and R arm wound vac for compartment syndrome - minimal non obstructive CAD with one episode of PAF in hospital - will be on DOAC for life for PE - will repeat echo for RV strain - reviewed CT and cath with patient - stop ASA unless vascular needs it; stop   Mild AI Intermittent LE edema - has echo in f/u if still having edema will start low dose lasix, at least as 20 mg PO PRN LE edema  Fall f/u with me     Medication Adjustments/Labs and Tests Ordered: Current medicines are reviewed at length with the patient today.  Concerns regarding medicines are outlined above.  Orders Placed This Encounter  Procedures   EKG 12-Lead   ECHOCARDIOGRAM COMPLETE   No orders of the defined types were placed in this encounter.   Patient Instructions  Medication Instructions:  Your physician has recommended you make the following change in your medication:  STOP: Aspirin   *If you need a refill on your cardiac medications before your next appointment, please call your pharmacy*   Lab Work: NONE  If you have labs (blood work) drawn today and your tests are completely normal, you will receive your results only by: MyChart Message (if you have MyChart) OR A paper copy in the mail If you have any lab test that is abnormal or we need to change your treatment, we will call you to review the results.   Testing/Procedures: LATE JUNE: Your physician has requested that you have an echocardiogram. Echocardiography is a painless test that uses sound waves to create images of your heart. It provides your doctor with information about the size and shape of your  heart and how well your heart's chambers and valves are working. This procedure takes approximately one hour. There are no restrictions for this procedure. Please do NOT wear cologne, perfume, aftershave, or lotions (deodorant is allowed). Please arrive 15 minutes prior to your appointment time.    Follow-Up: At [facility], you and your health needs are our priority.  As part of our continuing mission to provide you with exceptional heart care, we have created designated Provider Care Teams.  These Care Teams include your primary Cardiologist (physician) and Advanced Practice Providers (APPs -  Physician Assistants and Nurse Practitioners) who all work together to provide you with the care you need, when you need it.     Your next appointment:   5-6 month(s)  Provider:   Christell Constant, MD        Signed, Christell Constant, MD  11/13/2022 4:28 PM    [facility]

## 2022-11-13 ENCOUNTER — Ambulatory Visit: Payer: PPO | Attending: Internal Medicine | Admitting: Internal Medicine

## 2022-11-13 ENCOUNTER — Encounter: Payer: Self-pay | Admitting: Internal Medicine

## 2022-11-13 VITALS — BP 120/58 | HR 82 | Ht 69.0 in | Wt 202.0 lb

## 2022-11-13 DIAGNOSIS — I351 Nonrheumatic aortic (valve) insufficiency: Secondary | ICD-10-CM | POA: Diagnosis not present

## 2022-11-13 DIAGNOSIS — I480 Paroxysmal atrial fibrillation: Secondary | ICD-10-CM | POA: Diagnosis not present

## 2022-11-13 DIAGNOSIS — T8189XA Other complications of procedures, not elsewhere classified, initial encounter: Secondary | ICD-10-CM | POA: Diagnosis not present

## 2022-11-13 DIAGNOSIS — I10 Essential (primary) hypertension: Secondary | ICD-10-CM | POA: Diagnosis not present

## 2022-11-13 DIAGNOSIS — I2602 Saddle embolus of pulmonary artery with acute cor pulmonale: Secondary | ICD-10-CM

## 2022-11-13 NOTE — Patient Instructions (Signed)
Medication Instructions:  Your physician has recommended you make the following change in your medication:  STOP: Aspirin   *If you need a refill on your cardiac medications before your next appointment, please call your pharmacy*   Lab Work: NONE  If you have labs (blood work) drawn today and your tests are completely normal, you will receive your results only by: MyChart Message (if you have MyChart) OR A paper copy in the mail If you have any lab test that is abnormal or we need to change your treatment, we will call you to review the results.   Testing/Procedures: LATE JUNE: Your physician has requested that you have an echocardiogram. Echocardiography is a painless test that uses sound waves to create images of your heart. It provides your doctor with information about the size and shape of your heart and how well your heart's chambers and valves are working. This procedure takes approximately one hour. There are no restrictions for this procedure. Please do NOT wear cologne, perfume, aftershave, or lotions (deodorant is allowed). Please arrive 15 minutes prior to your appointment time.    Follow-Up: At [facility], you and your health needs are our priority.  As part of our continuing mission to provide you with exceptional heart care, we have created designated Provider Care Teams.  These Care Teams include your primary Cardiologist (physician) and Advanced Practice Providers (APPs -  Physician Assistants and Nurse Practitioners) who all work together to provide you with the care you need, when you need it.     Your next appointment:   5-6 month(s)  Provider:   Christell Constant, MD

## 2022-11-14 DIAGNOSIS — I2510 Atherosclerotic heart disease of native coronary artery without angina pectoris: Secondary | ICD-10-CM | POA: Diagnosis not present

## 2022-11-14 DIAGNOSIS — D500 Iron deficiency anemia secondary to blood loss (chronic): Secondary | ICD-10-CM | POA: Diagnosis not present

## 2022-11-14 DIAGNOSIS — M109 Gout, unspecified: Secondary | ICD-10-CM | POA: Diagnosis not present

## 2022-11-14 DIAGNOSIS — R739 Hyperglycemia, unspecified: Secondary | ICD-10-CM | POA: Diagnosis not present

## 2022-11-14 DIAGNOSIS — I2692 Saddle embolus of pulmonary artery without acute cor pulmonale: Secondary | ICD-10-CM | POA: Diagnosis not present

## 2022-11-14 DIAGNOSIS — Z79899 Other long term (current) drug therapy: Secondary | ICD-10-CM | POA: Diagnosis not present

## 2022-11-17 DIAGNOSIS — T8189XA Other complications of procedures, not elsewhere classified, initial encounter: Secondary | ICD-10-CM | POA: Diagnosis not present

## 2022-11-19 ENCOUNTER — Other Ambulatory Visit: Payer: Self-pay

## 2022-11-19 ENCOUNTER — Other Ambulatory Visit: Payer: Self-pay | Admitting: Internal Medicine

## 2022-11-19 ENCOUNTER — Telehealth: Payer: Self-pay

## 2022-11-19 MED ORDER — METOPROLOL TARTRATE 25 MG PO TABS: 12.5000 mg | ORAL_TABLET | Freq: Two times a day (BID) | ORAL | 0 refills | Status: DC

## 2022-11-19 MED ORDER — ROSUVASTATIN CALCIUM 40 MG PO TABS: 40.0000 mg | ORAL_TABLET | Freq: Every day | ORAL | 0 refills | Status: DC

## 2022-11-19 NOTE — Telephone Encounter (Signed)
Pt calling requesting a refill on metoprolol and rosuvastatin. These medication were prescribed in the hospital. Would Dr. Izora Ribas like to refill these medications? Please address

## 2022-11-19 NOTE — Telephone Encounter (Signed)
Pt called to ask if he needed to have his wound vac changed on Monday, 5/13 or wait until his office visit on 5/14.  Reviewed pt's chart, returned call for clarification, two identifiers used. Instructed pt to keep his HH wound vac change on Monday and bring all his supplies with him to his office visit. Confirmed understanding.

## 2022-11-24 ENCOUNTER — Other Ambulatory Visit: Payer: Self-pay | Admitting: *Deleted

## 2022-11-24 DIAGNOSIS — I480 Paroxysmal atrial fibrillation: Secondary | ICD-10-CM

## 2022-11-24 DIAGNOSIS — I2602 Saddle embolus of pulmonary artery with acute cor pulmonale: Secondary | ICD-10-CM

## 2022-11-24 MED ORDER — APIXABAN 5 MG PO TABS
5.0000 mg | ORAL_TABLET | Freq: Two times a day (BID) | ORAL | 5 refills | Status: DC
Start: 2022-11-24 — End: 2023-05-20

## 2022-11-24 NOTE — Telephone Encounter (Signed)
Eliquis 5mg  refill request received. Patient is 82 years old, weight-91.6kg, Crea-1.12 on 10/24/22, Diagnosis-PE & Afib (will be on DOAC for life for PE), and last seen by Dr. Izora Ribas on 11/13/22. Dose is appropriate based on dosing criteria. Will send in refill to requested pharmacy.

## 2022-11-25 ENCOUNTER — Ambulatory Visit (INDEPENDENT_AMBULATORY_CARE_PROVIDER_SITE_OTHER): Payer: PPO | Admitting: Physician Assistant

## 2022-11-25 VITALS — BP 120/76 | HR 73 | Temp 97.7°F | Resp 20 | Ht 69.0 in | Wt 204.1 lb

## 2022-11-25 DIAGNOSIS — S55101A Unspecified injury of radial artery at forearm level, right arm, initial encounter: Secondary | ICD-10-CM

## 2022-11-25 DIAGNOSIS — Z9889 Other specified postprocedural states: Secondary | ICD-10-CM

## 2022-11-25 NOTE — Progress Notes (Signed)
  POST OPERATIVE OFFICE NOTE    CC:  F/u for surgery   HPI:  This is a 82 y.o. male who underwent heart catheterization through right radial artery on 10/13/2022.  He was then found to have saddle PE requiring tPA and developed forearm swelling and hematoma.  He had a right radial artery pseudoaneurysm with compartment syndrome.  He underwent right forearm fasciotomy by hand surgery and right radial thrombectomy and pseudoaneurysm repair with VAC placement by Dr. Chestine Spore on 10/15/2022.  He was taken back to the OR on several occasions thereafter for washout and VAC placement.  He is active with home health for VAC changes.  Unfortunately he ran out of supplies and has been performing wet-to-dry dressing changes over the past several days.  He he had a fever yesterday as well as Sunday however reportedly feels well today.  He denies any purulence or change in the appearance of the forearm wound.  He is accompanied today by his wife.  No Known Allergies  Current Outpatient Medications  Medication Sig Dispense Refill   allopurinol (ZYLOPRIM) 300 MG tablet Take 150 mg by mouth daily.      apixaban (ELIQUIS) 5 MG TABS tablet Take 1 tablet (5 mg total) by mouth 2 (two) times daily. 60 tablet 5   ascorbic acid (VITAMIN C) 500 MG tablet Take 500 mg by mouth daily.     Cholecalciferol (VITAMIN D3) 5000 units CAPS Take 2,000 Units by mouth daily with supper.      finasteride (PROSCAR) 5 MG tablet Take 5 mg by mouth at bedtime.      fluticasone (FLONASE) 50 MCG/ACT nasal spray Place 1 spray into both nostrils every evening.     magnesium gluconate (MAGONATE) 500 MG tablet Take 500 mg by mouth daily.     metoprolol tartrate (LOPRESSOR) 25 MG tablet Take 0.5 tablets (12.5 mg total) by mouth 2 (two) times daily. 30 tablet 0   Omega-3 Fatty Acids (FISH OIL) 1000 MG CAPS Take 1,000 mg by mouth daily.     omeprazole (PRILOSEC) 40 MG capsule Take 1 capsule (40 mg total) by mouth daily. 90 capsule 3   rosuvastatin  (CRESTOR) 40 MG tablet Take 1 tablet (40 mg total) by mouth daily. 30 tablet 0   zinc gluconate 50 MG tablet Take 50 mg by mouth daily.     No current facility-administered medications for this visit.     ROS:  See HPI  Physical Exam:  Vitals:   11/25/22 1054  BP: 120/76  Pulse: 73  Resp: 20  Temp: 97.7 F (36.5 C)  TempSrc: Temporal  SpO2: 95%  Weight: 204 lb 1.6 oz (92.6 kg)  Height: 5\' 9"  (1.753 m)    Incision: Right forearm wound pictured below; granulating well with some fibrinous exudate Extremities: Palpable right radial pulse Neuro: A&O    Assessment/Plan:  This is a 82 y.o. male who is s/p: Forearm fasciotomy with radial thrombectomy and pseudoaneurysm repair  -Right hand well-perfused with motor and sensation intact.  He has an easily palpable radial pulse.  Based on evaluation by Dr. Chestine Spore, wound bed is much smaller compared to hospitalization.  Wound is granulating really well and appears clean.  Some of the fibrinous exudate was debrided sharply.  Wound VAC was replaced.  He will continue VAC changes with home health.  He will return in 3 weeks for another wound check.   Ralph Rutter, PA-C [facility] [phone]  Clinic MD:  Chestine Spore

## 2022-11-26 DIAGNOSIS — T8189XA Other complications of procedures, not elsewhere classified, initial encounter: Secondary | ICD-10-CM | POA: Diagnosis not present

## 2022-11-26 DIAGNOSIS — I2609 Other pulmonary embolism with acute cor pulmonale: Secondary | ICD-10-CM | POA: Diagnosis not present

## 2022-11-26 DIAGNOSIS — T79A11A Traumatic compartment syndrome of right upper extremity, initial encounter: Secondary | ICD-10-CM | POA: Diagnosis not present

## 2022-11-26 DIAGNOSIS — I480 Paroxysmal atrial fibrillation: Secondary | ICD-10-CM | POA: Diagnosis not present

## 2022-11-26 DIAGNOSIS — S5010XA Contusion of unspecified forearm, initial encounter: Secondary | ICD-10-CM | POA: Diagnosis not present

## 2022-11-26 DIAGNOSIS — D696 Thrombocytopenia, unspecified: Secondary | ICD-10-CM | POA: Diagnosis not present

## 2022-11-26 DIAGNOSIS — I2089 Other forms of angina pectoris: Secondary | ICD-10-CM | POA: Diagnosis not present

## 2022-12-01 DIAGNOSIS — T8189XA Other complications of procedures, not elsewhere classified, initial encounter: Secondary | ICD-10-CM | POA: Diagnosis not present

## 2022-12-12 DIAGNOSIS — T8189XA Other complications of procedures, not elsewhere classified, initial encounter: Secondary | ICD-10-CM | POA: Diagnosis not present

## 2022-12-16 ENCOUNTER — Ambulatory Visit (INDEPENDENT_AMBULATORY_CARE_PROVIDER_SITE_OTHER): Payer: PPO | Admitting: Physician Assistant

## 2022-12-16 VITALS — BP 126/70 | HR 60 | Temp 97.6°F | Wt 207.0 lb

## 2022-12-16 DIAGNOSIS — S55101A Unspecified injury of radial artery at forearm level, right arm, initial encounter: Secondary | ICD-10-CM

## 2022-12-16 NOTE — Progress Notes (Signed)
  POST OPERATIVE OFFICE NOTE    CC:  F/u for surgery  HPI:  This is a 82 y.o. male who is s/p Forearm fasciotomy with radial thrombectomy and pseudoaneurysm repair.  He has a wound vac dressing that is followed by [facility] RN weekly and he and his wife change it every 3 days.  They are very pleased with the healing process they are seeing.  He denies fevers and chills.  He denies steal syndrome of the right UE.     No Known Allergies  Current Outpatient Medications  Medication Sig Dispense Refill   allopurinol (ZYLOPRIM) 300 MG tablet Take 150 mg by mouth daily.      apixaban (ELIQUIS) 5 MG TABS tablet Take 1 tablet (5 mg total) by mouth 2 (two) times daily. 60 tablet 5   ascorbic acid (VITAMIN C) 500 MG tablet Take 500 mg by mouth daily.     Cholecalciferol (VITAMIN D3) 5000 units CAPS Take 2,000 Units by mouth daily with supper.      finasteride (PROSCAR) 5 MG tablet Take 5 mg by mouth at bedtime.      fluticasone (FLONASE) 50 MCG/ACT nasal spray Place 1 spray into both nostrils every evening.     magnesium gluconate (MAGONATE) 500 MG tablet Take 500 mg by mouth daily.     metoprolol tartrate (LOPRESSOR) 25 MG tablet Take 0.5 tablets (12.5 mg total) by mouth 2 (two) times daily. 30 tablet 0   Omega-3 Fatty Acids (FISH OIL) 1000 MG CAPS Take 1,000 mg by mouth daily.     omeprazole (PRILOSEC) 40 MG capsule Take 1 capsule (40 mg total) by mouth daily. 90 capsule 3   rosuvastatin (CRESTOR) 40 MG tablet Take 1 tablet (40 mg total) by mouth daily. 30 tablet 0   zinc gluconate 50 MG tablet Take 50 mg by mouth daily.     No current facility-administered medications for this visit.     ROS:  See HPI  Physical Exam:  Palpable radial pulse brisk doppler ulnar and palmer signals.  Incision:  healing well with increased granulation tissue now covering the tendon.   Extremities:  Grip and sensation intact equal B UE Vac with good suction   Assessment/Plan:  This is a 82 y.o. male who is  s/p:Forearm fasciotomy with radial thrombectomy and pseudoaneurysm repair     Once the wound is supficialized he can discontinue the wound vac and do wet to dry dressing changes BID.  He has maintained palpable radial pulse.  I will schedule another wound check f/u visit for 3-4 weeks from now.  He is anxious to get into his pool.    He is medically managed on Eliquis for history of PE and known Afib as well as daily Statin.     [facility] [phone]   Clinic MD:  Chestine Spore

## 2022-12-22 ENCOUNTER — Other Ambulatory Visit: Payer: Self-pay

## 2022-12-22 MED ORDER — ROSUVASTATIN CALCIUM 40 MG PO TABS: 40.0000 mg | ORAL_TABLET | Freq: Every day | ORAL | 3 refills | Status: DC

## 2022-12-22 MED ORDER — METOPROLOL TARTRATE 25 MG PO TABS: 12.5000 mg | ORAL_TABLET | Freq: Two times a day (BID) | ORAL | 3 refills | Status: DC

## 2022-12-25 DIAGNOSIS — T8189XA Other complications of procedures, not elsewhere classified, initial encounter: Secondary | ICD-10-CM | POA: Diagnosis not present

## 2022-12-27 DIAGNOSIS — T79A11A Traumatic compartment syndrome of right upper extremity, initial encounter: Secondary | ICD-10-CM | POA: Diagnosis not present

## 2022-12-27 DIAGNOSIS — D696 Thrombocytopenia, unspecified: Secondary | ICD-10-CM | POA: Diagnosis not present

## 2022-12-27 DIAGNOSIS — I2089 Other forms of angina pectoris: Secondary | ICD-10-CM | POA: Diagnosis not present

## 2022-12-27 DIAGNOSIS — T8189XA Other complications of procedures, not elsewhere classified, initial encounter: Secondary | ICD-10-CM | POA: Diagnosis not present

## 2022-12-27 DIAGNOSIS — S5010XA Contusion of unspecified forearm, initial encounter: Secondary | ICD-10-CM | POA: Diagnosis not present

## 2022-12-27 DIAGNOSIS — I2609 Other pulmonary embolism with acute cor pulmonale: Secondary | ICD-10-CM | POA: Diagnosis not present

## 2022-12-27 DIAGNOSIS — I480 Paroxysmal atrial fibrillation: Secondary | ICD-10-CM | POA: Diagnosis not present

## 2022-12-30 ENCOUNTER — Ambulatory Visit (INDEPENDENT_AMBULATORY_CARE_PROVIDER_SITE_OTHER): Payer: PPO | Admitting: Physician Assistant

## 2022-12-30 VITALS — BP 146/76 | HR 60 | Temp 97.9°F | Resp 18 | Ht 69.0 in | Wt 215.1 lb

## 2022-12-30 DIAGNOSIS — Z9889 Other specified postprocedural states: Secondary | ICD-10-CM

## 2022-12-30 DIAGNOSIS — S55101A Unspecified injury of radial artery at forearm level, right arm, initial encounter: Secondary | ICD-10-CM

## 2022-12-30 NOTE — Progress Notes (Signed)
  POST OPERATIVE OFFICE NOTE    CC:  F/u for surgery  HPI:  This is a 82 y.o. male who is s/p right forearm fasciotomy by hand surgery and right radial thrombectomy and pseudoaneurysm repair with VAC placement by Dr. Chestine Spore on 10/15/2022.  This was caused by right radial artery catheterization for heart catheterization 2 days prior.  Mr. Doubleday and his wife are pleased with his progress.  He believes he has full range of motion in his hand and baseline grip strength.  He is active with home health for wound VAC changes 3 days/week.  He denies any fevers, chills, nausea/vomiting.  He is on Eliquis daily he was found to have saddle PE around the time of his heart catheterization.  No Known Allergies  Current Outpatient Medications  Medication Sig Dispense Refill   allopurinol (ZYLOPRIM) 300 MG tablet Take 150 mg by mouth daily.      apixaban (ELIQUIS) 5 MG TABS tablet Take 1 tablet (5 mg total) by mouth 2 (two) times daily. 60 tablet 5   ascorbic acid (VITAMIN C) 500 MG tablet Take 500 mg by mouth daily.     Cholecalciferol (VITAMIN D3) 5000 units CAPS Take 2,000 Units by mouth daily with supper.      finasteride (PROSCAR) 5 MG tablet Take 5 mg by mouth at bedtime.      fluticasone (FLONASE) 50 MCG/ACT nasal spray Place 1 spray into both nostrils every evening.     magnesium gluconate (MAGONATE) 500 MG tablet Take 500 mg by mouth daily.     metoprolol tartrate (LOPRESSOR) 25 MG tablet Take 0.5 tablets (12.5 mg total) by mouth 2 (two) times daily. 90 tablet 3   Omega-3 Fatty Acids (FISH OIL) 1000 MG CAPS Take 1,000 mg by mouth daily.     omeprazole (PRILOSEC) 40 MG capsule Take 1 capsule (40 mg total) by mouth daily. 90 capsule 3   rosuvastatin (CRESTOR) 40 MG tablet Take 1 tablet (40 mg total) by mouth daily. 90 tablet 3   zinc gluconate 50 MG tablet Take 50 mg by mouth daily.     No current facility-administered medications for this visit.     ROS:  See HPI  Physical Exam:  Vitals:    12/30/22 0859  BP: (!) 146/76  Pulse: 60  Resp: 18  Temp: 97.9 F (36.6 C)  TempSrc: Temporal  SpO2: 97%  Weight: 215 lb 1.6 oz (97.6 kg)  Height: 5\' 9"  (1.753 m)    Incision:   Extremities:  palpable R radial pulse; symmetrical grip strength; symmetrical sensation in the hand  Assessment/Plan:  This is a 82 y.o. male who is s/p: Forearm fasciotomy with radial artery thrombectomy and wound VAC placement  -Right hand is well-perfused with palpable radial pulse as well as motor and sensation intact.  Forearm wound continues to improve.  Home health will be at his house tomorrow morning to replace the wound VAC.  I dressed the wound with a wet-to-dry dressing and Ace wrap up to the level of the elbow after our office visit today.  We will recheck the wound in 3 to 4 weeks.  I mentioned to the patient and his wife that is okay if home health would like to discontinue the wound VAC for that time.   Emilie Rutter, PA-C [facility] [phone]  Clinic MD:  Chestine Spore

## 2023-01-05 DIAGNOSIS — G4733 Obstructive sleep apnea (adult) (pediatric): Secondary | ICD-10-CM | POA: Diagnosis not present

## 2023-01-06 ENCOUNTER — Ambulatory Visit (HOSPITAL_COMMUNITY): Payer: PPO | Attending: Internal Medicine

## 2023-01-06 DIAGNOSIS — I10 Essential (primary) hypertension: Secondary | ICD-10-CM | POA: Diagnosis not present

## 2023-01-06 DIAGNOSIS — I2602 Saddle embolus of pulmonary artery with acute cor pulmonale: Secondary | ICD-10-CM

## 2023-01-06 DIAGNOSIS — I351 Nonrheumatic aortic (valve) insufficiency: Secondary | ICD-10-CM | POA: Diagnosis not present

## 2023-01-06 DIAGNOSIS — T8189XA Other complications of procedures, not elsewhere classified, initial encounter: Secondary | ICD-10-CM | POA: Diagnosis not present

## 2023-01-06 LAB — ECHOCARDIOGRAM COMPLETE
Area-P 1/2: 3.84 cm2
S' Lateral: 3.5 cm

## 2023-01-09 ENCOUNTER — Telehealth: Payer: Self-pay

## 2023-01-09 NOTE — Telephone Encounter (Signed)
Holly, RN with [phone] [facility] called stating that [place]. She wanted to take the wound vac off and do a wet-to-dry dressing during the vacation. She reports that his wound looks good and measures 8.9 x 2.5".  Reviewed pt's chart, spoke to [place], [place] who advised that pt could make that change at the discretion of the [facility] RN.  [place], RN, no answer, lf vm relaying advice. Instructed pt not to go swimming.

## 2023-01-20 ENCOUNTER — Ambulatory Visit (INDEPENDENT_AMBULATORY_CARE_PROVIDER_SITE_OTHER): Payer: PPO | Admitting: Physician Assistant

## 2023-01-20 VITALS — BP 129/71 | HR 59 | Temp 98.0°F | Resp 18 | Ht 69.0 in | Wt 222.0 lb

## 2023-01-20 DIAGNOSIS — S55101A Unspecified injury of radial artery at forearm level, right arm, initial encounter: Secondary | ICD-10-CM

## 2023-01-20 MED ORDER — DAKINS (1/4 STRENGTH) 0.125 % EX SOLN: 1.0000 | Freq: Every day | CUTANEOUS | 0 refills | Status: DC

## 2023-01-20 MED ORDER — DAKINS (1/2 STRENGTH) 0.25 % EX SOLN: 1.0000 | Freq: Once | CUTANEOUS | 0 refills | Status: AC

## 2023-01-20 NOTE — Progress Notes (Signed)
  POST OPERATIVE OFFICE NOTE    CC:  F/u for surgery  HPI: This is a 82 y.o. male who is s/p right forearm fasciotomy by hand surgery and right radial thrombectomy and pseudoaneurysm repair with VAC placement by Dr. Chestine Spore on 10/15/2022. He is on Eliquis daily he was found to have saddle PE around the time of his heart catheterization.   He is performing right arm dressings wet to dry daily.  He has noted the wound is healing, but there has been greenish drainage on the dressings.  He denies fever and chills.    Current Outpatient Medications  Medication Sig Dispense Refill   sodium hypochlorite (DAKIN'S 1/2 STRENGTH) external solution Irrigate with 1 Application as directed once for 1 dose. Pseudomonas topical infection left forearm Post op 10 mL 0   sodium hypochlorite (DAKIN'S 1/4 STRENGTH) 0.125 % SOLN Irrigate with 1 Application as directed daily. Wet to dry right forearm wound 237 mL 0   allopurinol (ZYLOPRIM) 300 MG tablet Take 150 mg by mouth daily.      apixaban (ELIQUIS) 5 MG TABS tablet Take 1 tablet (5 mg total) by mouth 2 (two) times daily. 60 tablet 5   ascorbic acid (VITAMIN C) 500 MG tablet Take 500 mg by mouth daily.     Cholecalciferol (VITAMIN D3) 5000 units CAPS Take 2,000 Units by mouth daily with supper.      finasteride (PROSCAR) 5 MG tablet Take 5 mg by mouth at bedtime.      fluticasone (FLONASE) 50 MCG/ACT nasal spray Place 1 spray into both nostrils every evening.     magnesium gluconate (MAGONATE) 500 MG tablet Take 500 mg by mouth daily.     metoprolol tartrate (LOPRESSOR) 25 MG tablet Take 0.5 tablets (12.5 mg total) by mouth 2 (two) times daily. 90 tablet 3   Omega-3 Fatty Acids (FISH OIL) 1000 MG CAPS Take 1,000 mg by mouth daily.     omeprazole (PRILOSEC) 40 MG capsule Take 1 capsule (40 mg total) by mouth daily. 90 capsule 3   rosuvastatin (CRESTOR) 40 MG tablet Take 1 tablet (40 mg total) by mouth daily. 90 tablet 3   zinc gluconate 50 MG tablet Take 50 mg by  mouth daily.     No current facility-administered medications for this visit.     ROS:  See HPI  Physical Exam:    Incision:  pseudomonas on dressing with greenish discoloration Extremities:  Palpable radial pulse, hand well perfused     Assessment/Plan:   This is a 82 y.o. male who is s/p: Forearm fasciotomy with radial artery thrombectomy and wound VAC placement   -Right hand is well-perfused with palpable radial pulse as well as motor and sensation intact.  Forearm wound continues to improve.  I will start Dakin's solution wet to dry dressings every day and he will f/u in 3-4 weeks for wound check.  Mosetta Pigeon PA-C [facility] [phone]   Clinic MD:  Lenell Antu

## 2023-01-31 NOTE — Progress Notes (Signed)
HISTORY AND PHYSICAL     CC:  follow up Requesting Provider:  Thana Ates, MD  HPI: Ralph Blackwell is a 82 y.o. (1940-07-22) male  that underwent heart catheterization through a right transradial approach on 10/13/2022. 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 Vascular surgery was consulted. 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 He presents for operative intervention.    He is s/p right FA fasciotomy with CTR by Dr. Frazier Butt, right radial thrombectomy, right radial artery psa repair and vac placement on 10/15/2022 by Dr. Chestine Spore.    He was taken back to the OR on 10/17/2022 for washout and vac placement by Dr. Chestine Spore.  He went back to there OR on 10/20/2022 for washout, placement  of blake brain and vac placement with myriad morsels by Dr. Chestine Spore.  He was discharged home with vac and scheduled for return visit for vac change.    He was last seen on 01/20/2023 and at that time, his wound was closing up nicely and he was performing wet to dry dressing changes. He did have some greenish drainage and was started on Dakin's solution dressing changes and scheduled for 3-4 week follow up.    He is on Eliquis for saddle embolus that was found around the time of his heart catheterization.  He has hx of PE's in the past.   He returns today for follow up and here with his wife.  He states that his wound continues to get smaller and smaller.    The pt is on a statin for cholesterol management.  The pt is not on a daily aspirin.   Other AC:  Eliquis The pt is on BB for hypertension.   The pt not diabetic.   Tobacco hx:  never   Past Medical History:  Diagnosis Date   Coronary artery disease    GERD (gastroesophageal reflux disease)    Gout    History of hiatal hernia    Hypertension    Pulmonary embolism (HCC) 2008 after knee surgey   Renal  insufficiency    hx renal failure 2008 after blood clots   Sleep apnea     Past Surgical History:  Procedure Laterality Date   APPLICATION OF WOUND VAC Right 10/20/2022   Procedure: WOUND VAC EXCHANGE;  Surgeon: Cephus Shelling, MD;  Location: [facility] OR;  Service: Vascular;  Laterality: Right;   arthroscopic knee surgery  2004   BACK SURGERY  nov 1979 and 1998   lower   COLONOSCOPY WITH PROPOFOL N/A 10/20/2016   Procedure: COLONOSCOPY WITH PROPOFOL;  Surgeon: Charolett Bumpers, MD;  Location: [facility] ENDOSCOPY;  Service: Endoscopy;  Laterality: N/A;   FASCIOTOMY Right 10/15/2022   Procedure: RIGHT FOREARM FASCIOTOMY, RADIAL ARTERY REPAIR, AND RADIAL ARTERY THROMBECOTOMY;  Surgeon: Marlyne Beards, MD;  Location: [facility] OR;  Service: Orthopedics;  Laterality: Right;   I & D EXTREMITY Right 10/17/2022   Procedure: IRRIGATION AND DEBRIDEMENT RIGHT ARM & WOUND VAC CHANGE;  Surgeon: Cephus Shelling, MD;  Location: [facility] OR;  Service: Vascular;  Laterality: Right;   I & D EXTREMITY Right 10/22/2022   Procedure: REPEAT IRRIGATION AND DEBRIDEMENT RIGHT ARM WITH WOUND VAC CHANGE WITH MYRIAD PLACEMENT;  Surgeon: Cephus Shelling, MD;  Location: [facility] OR;  Service: Vascular;  Laterality: Right;   INCISION AND DRAINAGE OF WOUND Right 10/20/2022   Procedure: IRRIGATION AND DEBRIDEMENT SKIN,  SUBCUTANEOUS TISSUE AND MUSCLE WITH MYRIAD MORSELS AND DRAIN PLACEMENT;  Surgeon: Cephus Shelling, MD;  Location: [facility] OR;  Service: Vascular;  Laterality: Right;   IR ANGIOGRAM PULMONARY BILATERAL SELECTIVE  10/14/2022   IR ANGIOGRAM SELECTIVE EACH ADDITIONAL VESSEL  10/14/2022   IR ANGIOGRAM SELECTIVE EACH ADDITIONAL VESSEL  10/14/2022   IR INFUSION THROMBOL ARTERIAL INITIAL (MS)  10/14/2022   IR INFUSION THROMBOL ARTERIAL INITIAL (MS)  10/14/2022   IR THROMB F/U EVAL ART/VEN FINAL DAY (MS)  10/15/2022   IR US GUIDE VASC ACCESS RIGHT  10/14/2022   JOINT REPLACEMENT Left 2007   knee    LEFT HEART CATH AND CORONARY ANGIOGRAPHY N/A  06/30/2017   Procedure: LEFT HEART CATH AND CORONARY ANGIOGRAPHY;  Surgeon: Elder Negus, MD;  Location: [facility];  Service: Cardiovascular;  Laterality: N/A;   LEFT HEART CATH AND CORONARY ANGIOGRAPHY N/A 10/13/2022   Procedure: LEFT HEART CATH AND CORONARY ANGIOGRAPHY;  Surgeon: Elder Negus, MD;  Location: [facility];  Service: Cardiovascular;  Laterality: N/A;   left wrist plating   2005   titanium plate   right total knee replacement Right 2009    Social History   Socioeconomic History   Marital status: Married    Spouse name: Not on file   Number of children: 2   Years of education: Not on file   Highest education level: Not on file  Occupational History   Not on file  Tobacco Use   Smoking status: Never   Smokeless tobacco: Never  Vaping Use   Vaping status: Never Used  Substance and Sexual Activity   Alcohol use: No   Drug use: No   Sexual activity: Not on file  Other Topics Concern   Not on file  Social History Narrative   Not on file   Social Determinants of Health   Financial Resource Strain: Not on file  Food Insecurity: No Food Insecurity (10/13/2022)   Hunger Vital Sign    Worried About Running Out of Food in the Last Year: Never true    Ran Out of Food in the Last Year: Never true  Transportation Needs: No Transportation Needs (10/13/2022)   PRAPARE - Administrator, Civil Service (Medical): No    Lack of Transportation (Non-Medical): No  Physical Activity: Not on file  Stress: Not on file  Social Connections: Not on file  Intimate Partner Violence: Not At Risk (10/13/2022)   Humiliation, Afraid, Rape, and Kick questionnaire    Fear of Current or Ex-Partner: No    Emotionally Abused: No    Physically Abused: No    Sexually Abused: No     Family History  Problem Relation Age of Onset   Heart disease Mother    Hypertension Mother    Heart disease Father    Heart attack Father    Hypertension Father      Current Outpatient Medications  Medication Sig Dispense Refill   allopurinol (ZYLOPRIM) 300 MG tablet Take 150 mg by mouth daily.      apixaban (ELIQUIS) 5 MG TABS tablet Take 1 tablet (5 mg total) by mouth 2 (two) times daily. 60 tablet 5   ascorbic acid (VITAMIN C) 500 MG tablet Take 500 mg by mouth daily.     Cholecalciferol (VITAMIN D3) 5000 units CAPS Take 2,000 Units by mouth daily with supper.      finasteride (PROSCAR) 5 MG tablet Take 5 mg by mouth at bedtime.  fluticasone (FLONASE) 50 MCG/ACT nasal spray Place 1 spray into both nostrils every evening.     magnesium gluconate (MAGONATE) 500 MG tablet Take 500 mg by mouth daily.     metoprolol tartrate (LOPRESSOR) 25 MG tablet Take 0.5 tablets (12.5 mg total) by mouth 2 (two) times daily. 90 tablet 3   Omega-3 Fatty Acids (FISH OIL) 1000 MG CAPS Take 1,000 mg by mouth daily.     omeprazole (PRILOSEC) 40 MG capsule Take 1 capsule (40 mg total) by mouth daily. 90 capsule 3   rosuvastatin (CRESTOR) 40 MG tablet Take 1 tablet (40 mg total) by mouth daily. 90 tablet 3   sodium hypochlorite (DAKIN'S 1/4 STRENGTH) 0.125 % SOLN Irrigate with 1 Application as directed daily. Wet to dry right forearm wound 237 mL 0   zinc gluconate 50 MG tablet Take 50 mg by mouth daily.     No current facility-administered medications for this visit.    No Known Allergies   REVIEW OF SYSTEMS:   [X]  denotes positive finding, [ ]  denotes negative finding Cardiac  Comments:  Chest pain or chest pressure:    Shortness of breath upon exertion:    Short of breath when lying flat:    Irregular heart rhythm:        Vascular    Pain in calf, thigh, or hip brought on by ambulation:    Pain in feet at night that wakes you up from your sleep:     Blood clot in your veins:    Leg swelling:         Pulmonary    Oxygen at home:    Productive cough:     Wheezing:         Neurologic    Sudden weakness in arms or legs:     Sudden numbness in arms  or legs:     Sudden onset of difficulty speaking or slurred speech:    Temporary loss of vision in one eye:     Problems with dizziness:         Gastrointestinal    Blood in stool:     Vomited blood:         Genitourinary    Burning when urinating:     Blood in urine:        Psychiatric    Major depression:         Hematologic    Bleeding problems:    Problems with blood clotting too easily:        Skin    Rashes or ulcers:        Constitutional    Fever or chills:      PHYSICAL EXAMINATION:  Today's Vitals   02/03/23 1245  BP: 128/72  Pulse: (!) 59  Resp: 18  Temp: 98 F (36.7 C)  TempSrc: Temporal  SpO2: 100%  Weight: 201 lb 9.6 oz (91.4 kg)  Height: 6\' 3"  (1.905 m)   Body mass index is 25.2 kg/m.   General:  WDWN in NAD; vital signs documented above Gait: Not observed HENT: WNL, normocephalic Pulmonary: normal non-labored breathing Cardiac: regular HR Skin: without rashes Vascular Exam/Pulses:  Right Left  Radial 2+ (normal) 2+ (normal)   Extremities: right forearm is healing  Musculoskeletal: no muscle wasting or atrophy  Neurologic: A&O X 3 Psychiatric:  The pt has Normal affect.   ASSESSMENT/PLAN:: 82 y.o. male who is s/p right FA fasciotomy with CTR by Dr. Frazier Butt, right radial thrombectomy, right radial artery  psa repair and vac placement on 10/15/2022 by Dr. Chestine Spore.    He was taken back to the OR on 10/17/2022 for washout and vac placement by Dr. Chestine Spore.  He went back to there OR on 10/20/2022 for washout, placement  of blake brain and vac placement with myriad morsels by Dr. Chestine Spore.  He was discharged home with vac and scheduled for return visit for vac change.    -pt's wound continues to improve.  Pt was seen with Dr. Chestine Spore and silver nitrate was applied to granulation tissue on the wound.  He has palpable radial pulses bilaterally.   -he will return in 6 weeks for wound check.   -Dr. Chestine Spore advised him that it would be fine to swim in their salt  water pool.   Otherwise, continue wet to dry dressing changes.  -continue statin.  Pt on Eliquis for recurrent PE's.   Doreatha Massed, [facility] [phone]  Clinic MD:   Chestine Spore

## 2023-02-03 ENCOUNTER — Ambulatory Visit (INDEPENDENT_AMBULATORY_CARE_PROVIDER_SITE_OTHER): Payer: PPO | Admitting: Physician Assistant

## 2023-02-03 VITALS — BP 128/72 | HR 59 | Temp 98.0°F | Resp 18 | Ht 75.0 in | Wt 201.6 lb

## 2023-02-03 DIAGNOSIS — Z9889 Other specified postprocedural states: Secondary | ICD-10-CM | POA: Diagnosis not present

## 2023-03-07 DIAGNOSIS — R0981 Nasal congestion: Secondary | ICD-10-CM | POA: Diagnosis not present

## 2023-03-07 DIAGNOSIS — M791 Myalgia, unspecified site: Secondary | ICD-10-CM | POA: Diagnosis not present

## 2023-03-07 DIAGNOSIS — R059 Cough, unspecified: Secondary | ICD-10-CM | POA: Diagnosis not present

## 2023-03-11 DIAGNOSIS — R109 Unspecified abdominal pain: Secondary | ICD-10-CM | POA: Diagnosis not present

## 2023-03-12 ENCOUNTER — Other Ambulatory Visit (HOSPITAL_COMMUNITY): Payer: Self-pay | Admitting: Internal Medicine

## 2023-03-12 ENCOUNTER — Ambulatory Visit (HOSPITAL_COMMUNITY)
Admission: RE | Admit: 2023-03-12 | Discharge: 2023-03-12 | Disposition: A | Discharge status: Home / Self Care | Payer: PPO | Source: Ambulatory Visit | Attending: Internal Medicine | Admitting: Internal Medicine

## 2023-03-12 DIAGNOSIS — N281 Cyst of kidney, acquired: Secondary | ICD-10-CM | POA: Diagnosis not present

## 2023-03-12 DIAGNOSIS — N400 Enlarged prostate without lower urinary tract symptoms: Secondary | ICD-10-CM | POA: Diagnosis not present

## 2023-03-12 DIAGNOSIS — R109 Unspecified abdominal pain: Secondary | ICD-10-CM | POA: Diagnosis not present

## 2023-03-12 DIAGNOSIS — K8020 Calculus of gallbladder without cholecystitis without obstruction: Secondary | ICD-10-CM | POA: Diagnosis not present

## 2023-03-12 DIAGNOSIS — K5730 Diverticulosis of large intestine without perforation or abscess without bleeding: Secondary | ICD-10-CM | POA: Diagnosis not present

## 2023-03-12 MED ORDER — IOHEXOL 350 MG/ML SOLN
75.0000 mL | Freq: Once | INTRAVENOUS | Status: AC | PRN
  Administered 2023-03-12: 75 mL via INTRAVENOUS

## 2023-03-17 ENCOUNTER — Ambulatory Visit: Payer: PPO | Admitting: Physician Assistant

## 2023-03-17 ENCOUNTER — Encounter: Payer: Self-pay | Admitting: Physician Assistant

## 2023-03-17 VITALS — BP 136/80 | HR 68 | Temp 97.0°F | Resp 18 | Ht 69.5 in | Wt 217.0 lb

## 2023-03-17 DIAGNOSIS — M109 Gout, unspecified: Secondary | ICD-10-CM | POA: Diagnosis not present

## 2023-03-17 DIAGNOSIS — Z9889 Other specified postprocedural states: Secondary | ICD-10-CM | POA: Diagnosis not present

## 2023-03-17 DIAGNOSIS — D500 Iron deficiency anemia secondary to blood loss (chronic): Secondary | ICD-10-CM | POA: Diagnosis not present

## 2023-03-17 DIAGNOSIS — R7303 Prediabetes: Secondary | ICD-10-CM | POA: Diagnosis not present

## 2023-03-17 DIAGNOSIS — K219 Gastro-esophageal reflux disease without esophagitis: Secondary | ICD-10-CM | POA: Diagnosis not present

## 2023-03-17 DIAGNOSIS — Z0000 Encounter for general adult medical examination without abnormal findings: Secondary | ICD-10-CM | POA: Diagnosis not present

## 2023-03-17 DIAGNOSIS — I2510 Atherosclerotic heart disease of native coronary artery without angina pectoris: Secondary | ICD-10-CM | POA: Diagnosis not present

## 2023-03-17 DIAGNOSIS — Z9989 Dependence on other enabling machines and devices: Secondary | ICD-10-CM | POA: Diagnosis not present

## 2023-03-17 DIAGNOSIS — Z86711 Personal history of pulmonary embolism: Secondary | ICD-10-CM | POA: Diagnosis not present

## 2023-03-17 DIAGNOSIS — G4733 Obstructive sleep apnea (adult) (pediatric): Secondary | ICD-10-CM | POA: Diagnosis not present

## 2023-03-17 DIAGNOSIS — I723 Aneurysm of iliac artery: Secondary | ICD-10-CM | POA: Diagnosis not present

## 2023-03-17 DIAGNOSIS — S55101A Unspecified injury of radial artery at forearm level, right arm, initial encounter: Secondary | ICD-10-CM | POA: Diagnosis not present

## 2023-03-17 DIAGNOSIS — Z1331 Encounter for screening for depression: Secondary | ICD-10-CM | POA: Diagnosis not present

## 2023-03-17 DIAGNOSIS — E559 Vitamin D deficiency, unspecified: Secondary | ICD-10-CM | POA: Diagnosis not present

## 2023-03-17 DIAGNOSIS — N400 Enlarged prostate without lower urinary tract symptoms: Secondary | ICD-10-CM | POA: Diagnosis not present

## 2023-03-17 NOTE — Progress Notes (Signed)
Office Note     CC:  follow up Requesting Provider:  Thana Ates, MD  HPI: Ralph Blackwell is a 82 y.o. (1940/10/26) male who presents with his wife for close interval follow up after repair of right radial artery pseudoaneurysm, right radial thrombectomy by Dr Chestine Spore on 10/15/22. He also had Right Carpal Tunnel release and forearm fasciotomy with Dr. Frazier Butt on 10/15/22. He had several I&Ds with VAC changes while in the hospital and was ultimately discharged with [facility] on 10/27/22. Eventually he was transitioned to wet to dry dressing changes and at his last visit on 7/23 his wound was almost completely healed. Some silver nitrate was applied at his visit to help the granulation tissue in the wound bed.   Today he reports wound is doing well. Wound is now fully healed. He denies any pain in the right forearm or hand. However, he continues to have some weakness and numbness in right hand. This is unchanged. Also a feeling of fullness in the hand light it is tight. No real appreciable swelling. He is right handed and trying to use his arm and hand as much as possible but he is a little frustrated by the decreased sensation and strength. He as previously released from OT and admittedly says he has not been diligent about continuing to work through the exercises given to him.   The pt is on a statin for cholesterol management.  The pt is not on a daily aspirin.   Other AC:  Eliquis The pt is on BB for hypertension.   The pt not diabetic.   Tobacco hx:  never  Past Medical History:  Diagnosis Date   Coronary artery disease    GERD (gastroesophageal reflux disease)    Gout    History of hiatal hernia    Hypertension    Pulmonary embolism (HCC) 2008 after knee surgey   Renal insufficiency    hx renal failure 2008 after blood clots   Sleep apnea     Past Surgical History:  Procedure Laterality Date   APPLICATION OF WOUND VAC Right 10/20/2022   Procedure: WOUND VAC EXCHANGE;  Surgeon: Cephus Shelling, MD;  Location: [facility] OR;  Service: Vascular;  Laterality: Right;   arthroscopic knee surgery  2004   BACK SURGERY  nov 1979 and 1998   lower   COLONOSCOPY WITH PROPOFOL N/A 10/20/2016   Procedure: COLONOSCOPY WITH PROPOFOL;  Surgeon: Charolett Bumpers, MD;  Location: [facility];  Service: Endoscopy;  Laterality: N/A;   FASCIOTOMY Right 10/15/2022   Procedure: RIGHT FOREARM FASCIOTOMY, RADIAL ARTERY REPAIR, AND RADIAL ARTERY THROMBECOTOMY;  Surgeon: Marlyne Beards, MD;  Location: [facility] OR;  Service: Orthopedics;  Laterality: Right;   I & D EXTREMITY Right 10/17/2022   Procedure: IRRIGATION AND DEBRIDEMENT RIGHT ARM & WOUND VAC CHANGE;  Surgeon: Cephus Shelling, MD;  Location: [facility] OR;  Service: Vascular;  Laterality: Right;   I & D EXTREMITY Right 10/22/2022   Procedure: REPEAT IRRIGATION AND DEBRIDEMENT RIGHT ARM WITH WOUND VAC CHANGE WITH MYRIAD PLACEMENT;  Surgeon: Cephus Shelling, MD;  Location: [facility] OR;  Service: Vascular;  Laterality: Right;   INCISION AND DRAINAGE OF WOUND Right 10/20/2022   Procedure: IRRIGATION AND DEBRIDEMENT SKIN, SUBCUTANEOUS TISSUE AND MUSCLE WITH MYRIAD MORSELS AND DRAIN PLACEMENT;  Surgeon: Cephus Shelling, MD;  Location: [facility] OR;  Service: Vascular;  Laterality: Right;   IR ANGIOGRAM PULMONARY BILATERAL SELECTIVE  10/14/2022   IR ANGIOGRAM SELECTIVE EACH ADDITIONAL VESSEL  10/14/2022   IR ANGIOGRAM SELECTIVE EACH ADDITIONAL VESSEL  10/14/2022   IR INFUSION THROMBOL ARTERIAL INITIAL (MS)  10/14/2022   IR INFUSION THROMBOL ARTERIAL INITIAL (MS)  10/14/2022   IR THROMB F/U EVAL ART/VEN FINAL DAY (MS)  10/15/2022   IR US GUIDE VASC ACCESS RIGHT  10/14/2022   JOINT REPLACEMENT Left 2007   knee    LEFT HEART CATH AND CORONARY ANGIOGRAPHY N/A 06/30/2017   Procedure: LEFT HEART CATH AND CORONARY ANGIOGRAPHY;  Surgeon: Elder Negus, MD;  Location: [facility];  Service: Cardiovascular;  Laterality: N/A;   LEFT HEART CATH AND CORONARY ANGIOGRAPHY N/A  10/13/2022   Procedure: LEFT HEART CATH AND CORONARY ANGIOGRAPHY;  Surgeon: Elder Negus, MD;  Location: [facility];  Service: Cardiovascular;  Laterality: N/A;   left wrist plating   2005   titanium plate   right total knee replacement Right 2009    Social History   Socioeconomic History   Marital status: Married    Spouse name: Not on file   Number of children: 2   Years of education: Not on file   Highest education level: Not on file  Occupational History   Not on file  Tobacco Use   Smoking status: Never   Smokeless tobacco: Never  Vaping Use   Vaping status: Never Used  Substance and Sexual Activity   Alcohol use: No   Drug use: No   Sexual activity: Not on file  Other Topics Concern   Not on file  Social History Narrative   Not on file   Social Determinants of Health   Financial Resource Strain: Not on file  Food Insecurity: No Food Insecurity (10/13/2022)   Hunger Vital Sign    Worried About Running Out of Food in the Last Year: Never true    Ran Out of Food in the Last Year: Never true  Transportation Needs: No Transportation Needs (10/13/2022)   PRAPARE - Administrator, Civil Service (Medical): No    Lack of Transportation (Non-Medical): No  Physical Activity: Not on file  Stress: Not on file  Social Connections: Not on file  Intimate Partner Violence: Not At Risk (10/13/2022)   Humiliation, Afraid, Rape, and Kick questionnaire    Fear of Current or Ex-Partner: No    Emotionally Abused: No    Physically Abused: No    Sexually Abused: No    Family History  Problem Relation Age of Onset   Heart disease Mother    Hypertension Mother    Heart disease Father    Heart attack Father    Hypertension Father     Current Outpatient Medications  Medication Sig Dispense Refill   allopurinol (ZYLOPRIM) 300 MG tablet Take 150 mg by mouth daily.      ascorbic acid (VITAMIN C) 500 MG tablet Take 500 mg by mouth daily.     Cholecalciferol  (VITAMIN D3) 5000 units CAPS Take 2,000 Units by mouth daily with supper.      finasteride (PROSCAR) 5 MG tablet Take 5 mg by mouth at bedtime.      fluticasone (FLONASE) 50 MCG/ACT nasal spray Place 1 spray into both nostrils every evening.     magnesium gluconate (MAGONATE) 500 MG tablet Take 500 mg by mouth daily.     metoprolol tartrate (LOPRESSOR) 25 MG tablet Take 0.5 tablets (12.5 mg total) by mouth 2 (two) times daily. 90 tablet 3   omeprazole (PRILOSEC) 40 MG capsule Take 1  capsule (40 mg total) by mouth daily. 90 capsule 3   rosuvastatin (CRESTOR) 40 MG tablet Take 1 tablet (40 mg total) by mouth daily. 90 tablet 3   sodium hypochlorite (DAKIN'S 1/4 STRENGTH) 0.125 % SOLN Irrigate with 1 Application as directed daily. Wet to dry right forearm wound 237 mL 0   zinc gluconate 50 MG tablet Take 50 mg by mouth daily.     apixaban (ELIQUIS) 5 MG TABS tablet Take 1 tablet (5 mg total) by mouth 2 (two) times daily. 60 tablet 5   Omega-3 Fatty Acids (FISH OIL) 1000 MG CAPS Take 1,000 mg by mouth daily.     No current facility-administered medications for this visit.    No Known Allergies   REVIEW OF SYSTEMS:   [X]  denotes positive finding, [ ]  denotes negative finding Cardiac  Comments:  Chest pain or chest pressure:    Shortness of breath upon exertion:    Short of breath when lying flat:    Irregular heart rhythm:        Vascular    Pain in calf, thigh, or hip brought on by ambulation:    Pain in feet at night that wakes you up from your sleep:     Blood clot in your veins:    Leg swelling:         Pulmonary    Oxygen at home:    Productive cough:     Wheezing:         Neurologic    Sudden weakness in arms or legs:     Sudden numbness in arms or legs:     Sudden onset of difficulty speaking or slurred speech:    Temporary loss of vision in one eye:     Problems with dizziness:         Gastrointestinal    Blood in stool:     Vomited blood:         Genitourinary     Burning when urinating:     Blood in urine:        Psychiatric    Major depression:         Hematologic    Bleeding problems:    Problems with blood clotting too easily:        Skin    Rashes or ulcers:        Constitutional    Fever or chills:      PHYSICAL EXAMINATION:  Vitals:   03/17/23 1034  BP: 136/80  Pulse: 68  Resp: 18  Temp: (!) 97 F (36.1 C)  TempSrc: Temporal  SpO2: 94%  Weight: 217 lb (98.4 kg)  Height: 5' 9.5" (1.765 m)    General:  WDWN in NAD; vital signs documented above Gait: Normal HENT: WNL, normocephalic Pulmonary: normal non-labored breathing , without wheezing Cardiac: regular HR Abdomen: soft, NT, no masses Vascular Exam/Pulses: 2+ right brachial, right radial and right ulnar pulses. Right hand warm and well perfused. Motor and sensation intact. 4/5 grip strength Extremities: without ischemic changes, without Gangrene , without cellulitis; without open wounds; right forearm fasciotomy site healed Musculoskeletal: no muscle wasting or atrophy  Neurologic: A&O X 3 Psychiatric:  The pt has Normal affect.   ASSESSMENT/PLAN:: 82 y.o. male here for follow up up after repair of right radial artery pseudoaneurysm, right radial thrombectomy by Dr Chestine Spore on 10/15/22. He also had Right Carpal Tunnel release and forearm fasciotomy with Dr. Frazier Butt on 10/15/22. His right forearm fasciotomy wounds are all  healed now. Right arm is well perfused with palpable brachial, radial and ulnar pulses. Hand is warm. He is still having some weakness and numbness in the forearm and hand. Encourage him to continue to use his arm for ADLs as well as use the exercises recommend by OT. Provided reassurance that he could still have some improvement in the numbness and weakness from damage to the nerves as this can take 6+ months to 1 year. However did explain that he could have permanent damage as well and that time would tell. They were very understanding of this. - He does not  need any further follow up regarding his right arm/ hand. He knows to call if  he has any questions or concerns - He will keep follow up on 10/1 with Dr. Lenell Antu to routine surveillance follow up of right common iliac artery aneurysm   Graceann Congress, PA-C [facility] [phone]  Clinic MD:  Steve Rattler

## 2023-03-31 ENCOUNTER — Other Ambulatory Visit: Payer: Self-pay

## 2023-03-31 DIAGNOSIS — I77811 Abdominal aortic ectasia: Secondary | ICD-10-CM

## 2023-04-06 DIAGNOSIS — G4733 Obstructive sleep apnea (adult) (pediatric): Secondary | ICD-10-CM | POA: Diagnosis not present

## 2023-04-13 NOTE — Progress Notes (Deleted)
Office Note     CC:  follow up Requesting Provider:  Thana Ates, MD  HPI: Ralph Blackwell is a 82 y.o. (1940/07/16) male who presents with his wife for close interval follow up after repair of right radial artery pseudoaneurysm, right radial thrombectomy by Dr Chestine Spore on 10/15/22. He also had Right Carpal Tunnel release and forearm fasciotomy with Dr. Frazier Butt on 10/15/22. He had several I&Ds with VAC changes while in the hospital and was ultimately discharged with [facility] on 10/27/22. Eventually he was transitioned to wet to dry dressing changes and at his last visit on 7/23 his wound was almost completely healed. Some silver nitrate was applied at his visit to help the granulation tissue in the wound bed.   Today he reports wound is doing well. Wound is now fully healed. He denies any pain in the right forearm or hand. However, he continues to have some weakness and numbness in right hand. This is unchanged. Also a feeling of fullness in the hand light it is tight. No real appreciable swelling. He is right handed and trying to use his arm and hand as much as possible but he is a little frustrated by the decreased sensation and strength. He as previously released from OT and admittedly says he has not been diligent about continuing to work through the exercises given to him.   The pt is on a statin for cholesterol management.  The pt is not on a daily aspirin.   Other AC:  Eliquis The pt is on BB for hypertension.   The pt not diabetic.   Tobacco hx:  never  Past Medical History:  Diagnosis Date   Coronary artery disease    GERD (gastroesophageal reflux disease)    Gout    History of hiatal hernia    Hypertension    Pulmonary embolism (HCC) 2008 after knee surgey   Renal insufficiency    hx renal failure 2008 after blood clots   Sleep apnea     Past Surgical History:  Procedure Laterality Date   APPLICATION OF WOUND VAC Right 10/20/2022   Procedure: WOUND VAC EXCHANGE;  Surgeon: Cephus Shelling, MD;  Location: [facility] OR;  Service: Vascular;  Laterality: Right;   arthroscopic knee surgery  2004   BACK SURGERY  nov 1979 and 1998   lower   COLONOSCOPY WITH PROPOFOL N/A 10/20/2016   Procedure: COLONOSCOPY WITH PROPOFOL;  Surgeon: Charolett Bumpers, MD;  Location: [facility];  Service: Endoscopy;  Laterality: N/A;   FASCIOTOMY Right 10/15/2022   Procedure: RIGHT FOREARM FASCIOTOMY, RADIAL ARTERY REPAIR, AND RADIAL ARTERY THROMBECOTOMY;  Surgeon: Marlyne Beards, MD;  Location: [facility] OR;  Service: Orthopedics;  Laterality: Right;   I & D EXTREMITY Right 10/17/2022   Procedure: IRRIGATION AND DEBRIDEMENT RIGHT ARM & WOUND VAC CHANGE;  Surgeon: Cephus Shelling, MD;  Location: [facility] OR;  Service: Vascular;  Laterality: Right;   I & D EXTREMITY Right 10/22/2022   Procedure: REPEAT IRRIGATION AND DEBRIDEMENT RIGHT ARM WITH WOUND VAC CHANGE WITH MYRIAD PLACEMENT;  Surgeon: Cephus Shelling, MD;  Location: [facility] OR;  Service: Vascular;  Laterality: Right;   INCISION AND DRAINAGE OF WOUND Right 10/20/2022   Procedure: IRRIGATION AND DEBRIDEMENT SKIN, SUBCUTANEOUS TISSUE AND MUSCLE WITH MYRIAD MORSELS AND DRAIN PLACEMENT;  Surgeon: Cephus Shelling, MD;  Location: [facility] OR;  Service: Vascular;  Laterality: Right;   IR ANGIOGRAM PULMONARY BILATERAL SELECTIVE  10/14/2022   IR ANGIOGRAM SELECTIVE EACH ADDITIONAL VESSEL  10/14/2022   IR ANGIOGRAM SELECTIVE EACH ADDITIONAL VESSEL  10/14/2022   IR INFUSION THROMBOL ARTERIAL INITIAL (MS)  10/14/2022   IR INFUSION THROMBOL ARTERIAL INITIAL (MS)  10/14/2022   IR THROMB F/U EVAL ART/VEN FINAL DAY (MS)  10/15/2022   IR US GUIDE VASC ACCESS RIGHT  10/14/2022   JOINT REPLACEMENT Left 2007   knee    LEFT HEART CATH AND CORONARY ANGIOGRAPHY N/A 06/30/2017   Procedure: LEFT HEART CATH AND CORONARY ANGIOGRAPHY;  Surgeon: Elder Negus, MD;  Location: [facility];  Service: Cardiovascular;  Laterality: N/A;   LEFT HEART CATH AND CORONARY ANGIOGRAPHY N/A  10/13/2022   Procedure: LEFT HEART CATH AND CORONARY ANGIOGRAPHY;  Surgeon: Elder Negus, MD;  Location: [facility];  Service: Cardiovascular;  Laterality: N/A;   left wrist plating   2005   titanium plate   right total knee replacement Right 2009    Social History   Socioeconomic History   Marital status: Married    Spouse name: Not on file   Number of children: 2   Years of education: Not on file   Highest education level: Not on file  Occupational History   Not on file  Tobacco Use   Smoking status: Never   Smokeless tobacco: Never  Vaping Use   Vaping status: Never Used  Substance and Sexual Activity   Alcohol use: No   Drug use: No   Sexual activity: Not on file  Other Topics Concern   Not on file  Social History Narrative   Not on file   Social Determinants of Health   Financial Resource Strain: Not on file  Food Insecurity: No Food Insecurity (10/13/2022)   Hunger Vital Sign    Worried About Running Out of Food in the Last Year: Never true    Ran Out of Food in the Last Year: Never true  Transportation Needs: No Transportation Needs (10/13/2022)   PRAPARE - Administrator, Civil Service (Medical): No    Lack of Transportation (Non-Medical): No  Physical Activity: Not on file  Stress: Not on file  Social Connections: Not on file  Intimate Partner Violence: Not At Risk (10/13/2022)   Humiliation, Afraid, Rape, and Kick questionnaire    Fear of Current or Ex-Partner: No    Emotionally Abused: No    Physically Abused: No    Sexually Abused: No    Family History  Problem Relation Age of Onset   Heart disease Mother    Hypertension Mother    Heart disease Father    Heart attack Father    Hypertension Father     Current Outpatient Medications  Medication Sig Dispense Refill   allopurinol (ZYLOPRIM) 300 MG tablet Take 150 mg by mouth daily.      apixaban (ELIQUIS) 5 MG TABS tablet Take 1 tablet (5 mg total) by mouth 2 (two) times  daily. 60 tablet 5   ascorbic acid (VITAMIN C) 500 MG tablet Take 500 mg by mouth daily.     Cholecalciferol (VITAMIN D3) 5000 units CAPS Take 2,000 Units by mouth daily with supper.      finasteride (PROSCAR) 5 MG tablet Take 5 mg by mouth at bedtime.      fluticasone (FLONASE) 50 MCG/ACT nasal spray Place 1 spray into both nostrils every evening.     magnesium gluconate (MAGONATE) 500 MG tablet Take 500 mg by mouth daily.     metoprolol tartrate (LOPRESSOR) 25 MG tablet Take  0.5 tablets (12.5 mg total) by mouth 2 (two) times daily. 90 tablet 3   Omega-3 Fatty Acids (FISH OIL) 1000 MG CAPS Take 1,000 mg by mouth daily.     omeprazole (PRILOSEC) 40 MG capsule Take 1 capsule (40 mg total) by mouth daily. 90 capsule 3   rosuvastatin (CRESTOR) 40 MG tablet Take 1 tablet (40 mg total) by mouth daily. 90 tablet 3   sodium hypochlorite (DAKIN'S 1/4 STRENGTH) 0.125 % SOLN Irrigate with 1 Application as directed daily. Wet to dry right forearm wound 237 mL 0   zinc gluconate 50 MG tablet Take 50 mg by mouth daily.     No current facility-administered medications for this visit.    No Known Allergies   REVIEW OF SYSTEMS:   [X]  denotes positive finding, [ ]  denotes negative finding Cardiac  Comments:  Chest pain or chest pressure:    Shortness of breath upon exertion:    Short of breath when lying flat:    Irregular heart rhythm:        Vascular    Pain in calf, thigh, or hip brought on by ambulation:    Pain in feet at night that wakes you up from your sleep:     Blood clot in your veins:    Leg swelling:         Pulmonary    Oxygen at home:    Productive cough:     Wheezing:         Neurologic    Sudden weakness in arms or legs:     Sudden numbness in arms or legs:     Sudden onset of difficulty speaking or slurred speech:    Temporary loss of vision in one eye:     Problems with dizziness:         Gastrointestinal    Blood in stool:     Vomited blood:         Genitourinary     Burning when urinating:     Blood in urine:        Psychiatric    Major depression:         Hematologic    Bleeding problems:    Problems with blood clotting too easily:        Skin    Rashes or ulcers:        Constitutional    Fever or chills:      PHYSICAL EXAMINATION:  There were no vitals filed for this visit.   General:  WDWN in NAD; vital signs documented above Gait: Normal HENT: WNL, normocephalic Pulmonary: normal non-labored breathing , without wheezing Cardiac: regular HR Abdomen: soft, NT, no masses Vascular Exam/Pulses: 2+ right brachial, right radial and right ulnar pulses. Right hand warm and well perfused. Motor and sensation intact. 4/5 grip strength Extremities: without ischemic changes, without Gangrene , without cellulitis; without open wounds; right forearm fasciotomy site healed Musculoskeletal: no muscle wasting or atrophy  Neurologic: A&O X 3 Psychiatric:  The pt has Normal affect.   ASSESSMENT/PLAN:: 82 y.o. male here for follow up up after repair of right radial artery pseudoaneurysm, right radial thrombectomy by Dr Chestine Spore on 10/15/22. He also had Right Carpal Tunnel release and forearm fasciotomy with Dr. Frazier Butt on 10/15/22. His right forearm fasciotomy wounds are all healed now. Right arm is well perfused with palpable brachial, radial and ulnar pulses. Hand is warm. He is still having some weakness and numbness in the forearm and hand. Encourage him  to continue to use his arm for ADLs as well as use the exercises recommend by OT. Provided reassurance that he could still have some improvement in the numbness and weakness from damage to the nerves as this can take 6+ months to 1 year. However did explain that he could have permanent damage as well and that time would tell. They were very understanding of this. - He does not need any further follow up regarding his right arm/ hand. He knows to call if  he has any questions or concerns - He will keep  follow up on 10/1 with Dr. Lenell Antu to routine surveillance follow up of right common iliac artery aneurysm   Leonie Douglas, PA-C [facility] [phone]  Clinic MD:  Steve Rattler

## 2023-04-14 ENCOUNTER — Ambulatory Visit: Payer: PPO | Admitting: Vascular Surgery

## 2023-04-14 ENCOUNTER — Ambulatory Visit (HOSPITAL_COMMUNITY): Admission: RE | Admit: 2023-04-14 | Payer: PPO | Source: Ambulatory Visit

## 2023-04-27 ENCOUNTER — Other Ambulatory Visit: Payer: Self-pay | Admitting: Physician Assistant

## 2023-04-27 ENCOUNTER — Ambulatory Visit
Admission: RE | Admit: 2023-04-27 | Discharge: 2023-04-27 | Disposition: A | Discharge status: Home / Self Care | Payer: PPO | Source: Ambulatory Visit | Attending: Physician Assistant | Admitting: Physician Assistant

## 2023-04-27 DIAGNOSIS — J069 Acute upper respiratory infection, unspecified: Secondary | ICD-10-CM | POA: Diagnosis not present

## 2023-04-27 DIAGNOSIS — U071 COVID-19: Secondary | ICD-10-CM | POA: Diagnosis not present

## 2023-05-18 ENCOUNTER — Ambulatory Visit: Payer: PPO | Admitting: Internal Medicine

## 2023-05-18 NOTE — Progress Notes (Unsigned)
Cardiology Office Note:    Date:  05/20/2023   ID:  Ralph Blackwell, DOB [birth year], MRN [id number]  PCP:  Thana Ates, MD   [facility] Providers Cardiologist:  Christell Constant, MD     Referring MD: Joya Martyr, MD   CC: Non obstructive CAD Care  History of Present Illness:    Ralph Blackwell is a 82 y.o. male with a hx of HTN, HLD, CAD non obstructive in 2018 without prior intervention was admitted 10/11/22.  Has hx of PE; he was found to have large saddle PE.  He is s/p EKOS complicated by pseudoaneurysm. 2024: Transitioned to see me after cath and compartment syndrome in the setting of submassive PE  Discussed the use of AI scribe software for clinical note transcription with the patient, who gave verbal consent to proceed.  Mr. Ralph Blackwell, an 82 year old individual with a history of hypertension, hyperlipidemia, nonobstructive coronary artery disease, and a significant episode of saddle pulmonary embolism in 2024, presents for follow-up. The pulmonary embolism occurred post-travel and was complicated by the development of a pseudoaneurysm and atrial fibrillation following catheter-directed therapy. The patient also developed a significant hematoma and required a wound vac for compartment syndrome.  The patient's right ventricular function has shown improvement but has not completely returned to normal. He has been managing intermittent lower extremity edema with Lasix 20mg  PRN, which has shown significant improvement (no longer needs PRN diuretic). The patient also reports an active lifestyle, including walking, which has been beneficial for his health.  His wife told me at her visit that he has rehabed significantly; they did a cruise in [place] with no physical limitations.  The patient is currently on Eliquis for anticoagulation due to his history of pulmonary embolism and atrial fibrillation. He reports no issues with this medication. He also takes Metoprolol  0.5mg  BID, which has resulted in sinus bradycardia. However, the patient feels well and is not experiencing any symptoms related to this.  He wears his AppleWatch with no events.  Past Medical History:  Diagnosis Date   Coronary artery disease    GERD (gastroesophageal reflux disease)    Gout    History of hiatal hernia    Hypertension    Pulmonary embolism (HCC) 2008 after knee surgey   Renal insufficiency    hx renal failure 2008 after blood clots   Sleep apnea     Past Surgical History:  Procedure Laterality Date   APPLICATION OF WOUND VAC Right 10/20/2022   Procedure: WOUND VAC EXCHANGE;  Surgeon: Cephus Shelling, MD;  Location: [facility] OR;  Service: Vascular;  Laterality: Right;   arthroscopic knee surgery  2004   BACK SURGERY  nov 1979 and 1998   lower   COLONOSCOPY WITH PROPOFOL N/A 10/20/2016   Procedure: COLONOSCOPY WITH PROPOFOL;  Surgeon: Charolett Bumpers, MD;  Location: [facility];  Service: Endoscopy;  Laterality: N/A;   FASCIOTOMY Right 10/15/2022   Procedure: RIGHT FOREARM FASCIOTOMY, RADIAL ARTERY REPAIR, AND RADIAL ARTERY THROMBECOTOMY;  Surgeon: Marlyne Beards, MD;  Location: [facility] OR;  Service: Orthopedics;  Laterality: Right;   I & D EXTREMITY Right 10/17/2022   Procedure: IRRIGATION AND DEBRIDEMENT RIGHT ARM & WOUND VAC CHANGE;  Surgeon: Cephus Shelling, MD;  Location: [facility] OR;  Service: Vascular;  Laterality: Right;   I & D EXTREMITY Right 10/22/2022   Procedure: REPEAT IRRIGATION AND DEBRIDEMENT RIGHT ARM WITH WOUND VAC CHANGE WITH MYRIAD PLACEMENT;  Surgeon: Cephus Shelling, MD;  Location: [facility] OR;  Service: Vascular;  Laterality: Right;   INCISION AND DRAINAGE OF WOUND Right 10/20/2022   Procedure: IRRIGATION AND DEBRIDEMENT SKIN, SUBCUTANEOUS TISSUE AND MUSCLE WITH MYRIAD MORSELS AND DRAIN PLACEMENT;  Surgeon: Cephus Shelling, MD;  Location: [facility] OR;  Service: Vascular;  Laterality: Right;   IR ANGIOGRAM PULMONARY BILATERAL SELECTIVE  10/14/2022   IR ANGIOGRAM  SELECTIVE EACH ADDITIONAL VESSEL  10/14/2022   IR ANGIOGRAM SELECTIVE EACH ADDITIONAL VESSEL  10/14/2022   IR INFUSION THROMBOL ARTERIAL INITIAL (MS)  10/14/2022   IR INFUSION THROMBOL ARTERIAL INITIAL (MS)  10/14/2022   IR THROMB F/U EVAL ART/VEN FINAL DAY (MS)  10/15/2022   IR US GUIDE VASC ACCESS RIGHT  10/14/2022   JOINT REPLACEMENT Left 2007   knee    LEFT HEART CATH AND CORONARY ANGIOGRAPHY N/A 06/30/2017   Procedure: LEFT HEART CATH AND CORONARY ANGIOGRAPHY;  Surgeon: Elder Negus, MD;  Location: [facility];  Service: Cardiovascular;  Laterality: N/A;   LEFT HEART CATH AND CORONARY ANGIOGRAPHY N/A 10/13/2022   Procedure: LEFT HEART CATH AND CORONARY ANGIOGRAPHY;  Surgeon: Elder Negus, MD;  Location: [facility];  Service: Cardiovascular;  Laterality: N/A;   left wrist plating   2005   titanium plate   right total knee replacement Right 2009    Current Medications: Current Meds  Medication Sig   albuterol (VENTOLIN HFA) 108 (90 Base) MCG/ACT inhaler as needed for wheezing or shortness of breath.   allopurinol (ZYLOPRIM) 300 MG tablet Take 150 mg by mouth daily.    apixaban (ELIQUIS) 5 MG TABS tablet Take 1 tablet (5 mg total) by mouth 2 (two) times daily.   ascorbic acid (VITAMIN C) 500 MG tablet Take 500 mg by mouth daily.   Cholecalciferol (VITAMIN D3) 5000 units CAPS Take 2,000 Units by mouth daily with supper.    finasteride (PROSCAR) 5 MG tablet Take 5 mg by mouth at bedtime.    fluticasone (FLONASE) 50 MCG/ACT nasal spray Place 1 spray into both nostrils every evening.   magnesium gluconate (MAGONATE) 500 MG tablet Take 500 mg by mouth daily.   metoprolol tartrate (LOPRESSOR) 25 MG tablet Take 0.5 tablets (12.5 mg total) by mouth 2 (two) times daily.   omeprazole (PRILOSEC) 40 MG capsule Take 1 capsule (40 mg total) by mouth daily.   rosuvastatin (CRESTOR) 40 MG tablet Take 1 tablet (40 mg total) by mouth daily.   zinc gluconate 50 MG tablet Take 50 mg by  mouth daily.     Allergies:   Patient has no known allergies.   Social History   Socioeconomic History   Marital status: Married    Spouse name: Not on file   Number of children: 2   Years of education: Not on file   Highest education level: Not on file  Occupational History   Not on file  Tobacco Use   Smoking status: Never   Smokeless tobacco: Never  Vaping Use   Vaping status: Never Used  Substance and Sexual Activity   Alcohol use: No   Drug use: No   Sexual activity: Not on file  Other Topics Concern   Not on file  Social History Narrative   Not on file   Social Determinants of Health   Financial Resource Strain: Not on file  Food Insecurity: No Food Insecurity (10/13/2022)   Hunger Vital Sign    Worried About Running Out of Food in the Last Year: Never true  Ran Out of Food in the Last Year: Never true  Transportation Needs: No Transportation Needs (10/13/2022)   PRAPARE - Administrator, Civil Service (Medical): No    Lack of Transportation (Non-Medical): No  Physical Activity: Not on file  Stress: Not on file  Social Connections: Not on file    Social: I see his wife, Dois Davenport  Family History: The patient's family history includes Heart attack in his father; Heart disease in his father and mother; Hypertension in his father and mother.  ROS:   Please see the history of present illness.     EKGs/Labs/Other Studies Reviewed:    The following studies were reviewed today:  Cardiac Studies & Procedures   CARDIAC CATHETERIZATION  CARDIAC CATHETERIZATION 10/13/2022  Narrative Images from the original result were not included. LM: Normal LAD: Minimal luminal irregularities Lcx: Large, dominant. Normal RCA: Small, nondominant.  Normal  LVEDP normal  Resting Pd/Pa: 0.93 FFR: 0.88 CFR: 4.5 IMR: 27 (only marginally abnormal)  No angiographically evident significant coronary artery disease Nearly normal microvascular indicis  Above  findings do not explain the severity of patient's chest pain shortness of breath.  I personally reviewed and compared echocardiograms.  Echocardiogram performed outpatient last week showed RVSP of 28 mmHg.  RVSP was noted to be 69 mmHg on inpatient echocardiogram yesterday.  Patient reportedly has history of PEs in the past.  Given significant only different RVSP, patient's profound symptoms not explained by coronary or cardiac etiology, I strongly suspect acute pulmonary cause such as pulmonary embolism.  I personally discussed this with patient and patient's wife, as well as hospitalist Dr. Beverly Gust.  We will proceed with CT angiogram chest, with additional hydration.  If CT angiogram does not reveal acute PE, I will perform right heart catheterization tomorrow.   Elder Negus, MD Pager: [phone] Office: [phone]  Findings Coronary Findings Diagnostic  Dominance: Left  Left Main Vessel is normal in caliber. Vessel is angiographically normal.  Left Anterior Descending Vessel is normal in caliber. The vessel exhibits minimal luminal irregularities.  Left Circumflex Vessel is normal in caliber. Vessel is angiographically normal.  Right Coronary Artery Vessel is small. Vessel is angiographically normal.  Intervention  No interventions have been documented.   CARDIAC CATHETERIZATION  CARDIAC CATHETERIZATION 06/30/2017  Narrative  The left ventricular ejection fraction is 55-65% by visual estimate.  Mild nonobstructive coronary artery disease (Prox LAD 30%) with sluggish flow in all vessels. Normal filling pressures.  Recommendations: Continue medical management.  Elder Negus, MD [facility] Pager: [phone] Office: [phone] If no answer Cell [phone]  Findings Coronary Findings Diagnostic  Dominance: Left  Left Anterior Descending Ost LAD to Prox LAD lesion is 30% stenosed.  Intervention  No interventions have  been documented.     ECHOCARDIOGRAM  ECHOCARDIOGRAM COMPLETE 01/06/2023  Narrative ECHOCARDIOGRAM REPORT    Patient Name:   JALIN ALICEA Date of Exam: 01/06/2023 Medical Rec #:  [id number]       Height:       69.0 in Accession #:    [id number]      Weight:       215.1 lb Date of Birth:  [birth year]       BSA:          2.131 m Patient Age:    82 years        BP:           149/86 mmHg Patient Gender: M  HR:           72 bpm. Exam Location:  [address]  Procedure: 2D Echo, 3D Echo, Cardiac Doppler, Color Doppler and Strain Analysis  Indications:    I35.1 AI ; I26.02 Pulmonary embolus  History:        Patient has prior history of Echocardiogram examinations. CAD, Pulmonary embolus, Arrythmias:Atrial Fibrillation; Risk Factors:Hypertension and Dyslipidemia. Previous echo revealed LVEF 70% with PAP 69.2 mmHg, RV normal size and function and mild AR.  Sonographer:    Chanetta [facility], RDCS Referring Phys: [id number] [facility] A Beatris Belen  IMPRESSIONS   1. Left ventricular ejection fraction, by estimation, is 60 to 65%. The left ventricle has normal function. The left ventricle has no regional wall motion abnormalities. Left ventricular diastolic parameters are consistent with Grade I diastolic dysfunction (impaired relaxation). Elevated left ventricular end-diastolic pressure. The average left ventricular global longitudinal strain is -20.2 %. The global longitudinal strain is normal. 2. Compared with the echo 09/2022, RVSP has reduced from 69.2 mmHg to 40.5 mmHg. Right ventricular systolic function is normal. The right ventricular size is mildly enlarged. There is mildly elevated pulmonary artery systolic pressure. 3. The mitral valve is normal in structure. Trivial mitral valve regurgitation. No evidence of mitral stenosis. 4. The aortic valve is tricuspid. Aortic valve regurgitation is mild. No aortic stenosis is present. 5. The inferior vena cava is dilated in size  with >50% respiratory variability, suggesting right atrial pressure of 8 mmHg.  FINDINGS Left Ventricle: Left ventricular ejection fraction, by estimation, is 60 to 65%. The left ventricle has normal function. The left ventricle has no regional wall motion abnormalities. The average left ventricular global longitudinal strain is -20.2 %. The global longitudinal strain is normal. The left ventricular internal cavity size was normal in size. There is no left ventricular hypertrophy. Left ventricular diastolic parameters are consistent with Grade I diastolic dysfunction (impaired relaxation). Elevated left ventricular end-diastolic pressure.  Right Ventricle: Compared with the echo 09/2022, RVSP has reduced from 69.2 mmHg to 40.5 mmHg. The right ventricular size is mildly enlarged. No increase in right ventricular wall thickness. Right ventricular systolic function is normal. There is mildly elevated pulmonary artery systolic pressure. The tricuspid regurgitant velocity is 2.85 m/s, and with an assumed right atrial pressure of 8 mmHg, the estimated right ventricular systolic pressure is 40.5 mmHg.  Left Atrium: Left atrial size was normal in size.  Right Atrium: Right atrial size was normal in size.  Pericardium: There is no evidence of pericardial effusion.  Mitral Valve: The mitral valve is normal in structure. Trivial mitral valve regurgitation. No evidence of mitral valve stenosis.  Tricuspid Valve: The tricuspid valve is normal in structure. Tricuspid valve regurgitation is trivial. No evidence of tricuspid stenosis.  Aortic Valve: The aortic valve is tricuspid. Aortic valve regurgitation is mild. No aortic stenosis is present.  Pulmonic Valve: The pulmonic valve was normal in structure. Pulmonic valve regurgitation is mild. No evidence of pulmonic stenosis.  Aorta: The aortic root is normal in size and structure.  Venous: The inferior vena cava is dilated in size with greater than 50%  respiratory variability, suggesting right atrial pressure of 8 mmHg.  IAS/Shunts: No atrial level shunt detected by color flow Doppler.   LEFT VENTRICLE PLAX 2D LVIDd:         4.90 cm   Diastology LVIDs:         3.50 cm   LV e' medial:    7.98 cm/s LV PW:  0.90 cm   LV E/e' medial:  10.5 LV IVS:        0.90 cm   LV e' lateral:   12.63 cm/s LVOT diam:     2.40 cm   LV E/e' lateral: 6.6 LV SV:         118 LV SV Index:   56        2D Longitudinal Strain LVOT Area:     4.52 cm  2D Strain GLS (A2C):   -19.0 % 2D Strain GLS (A3C):   -23.0 % 2D Strain GLS (A4C):   -18.6 % 2D Strain GLS Avg:     -20.2 %  3D Volume EF: 3D EF:        65 % LV EDV:       152 ml LV ESV:       53 ml LV SV:        99 ml  RIGHT VENTRICLE             IVC RV Basal diam:  3.50 cm     IVC diam: 2.10 cm RV Mid diam:    3.00 cm RV S prime:     16.02 cm/s TAPSE (M-mode): 2.6 cm RVSP:           35.5 mmHg  LEFT ATRIUM             Index        RIGHT ATRIUM           Index LA diam:        4.30 cm 2.02 cm/m   RA Pressure: 3.00 mmHg LA Vol (A2C):   33.5 ml 15.72 ml/m  RA Area:     12.10 cm LA Vol (A4C):   29.5 ml 13.84 ml/m  RA Volume:   27.50 ml  12.91 ml/m LA Biplane Vol: 31.4 ml 14.74 ml/m AORTIC VALVE LVOT Vmax:   123.33 cm/s LVOT Vmean:  78.833 cm/s LVOT VTI:    0.262 m  AORTA Ao Root diam: 3.20 cm Ao Asc diam:  3.00 cm  MITRAL VALVE               TRICUSPID VALVE MV Area (PHT): 3.84 cm    TR Peak grad:   32.5 mmHg MV Decel Time: 197 msec    TR Vmax:        285.00 cm/s MV E velocity: 83.70 cm/s  Estimated RAP:  3.00 mmHg MV A velocity: 79.50 cm/s  RVSP:           35.5 mmHg MV E/A ratio:  1.05 SHUNTS Systemic VTI:  0.26 m Systemic Diam: 2.40 cm  Chilton Si MD Electronically signed by Chilton Si MD Signature Date/Time: 01/06/2023/12:36:25 PM    Final               Recent Labs: 10/11/2022: ALT 24 10/14/2022: B Natriuretic Peptide 39.8 10/22/2022: Magnesium  2.3 10/24/2022: BUN 34; Creatinine, Ser 1.12; Potassium 4.3; Sodium 134 10/25/2022: Hemoglobin 8.1; Platelets 311  Recent Lipid Panel No results found for: "CHOL", "TRIG", "HDL", "CHOLHDL", "VLDL", "LDLCALC", "LDLDIRECT"    Physical Exam:    VS:  BP 122/60   Pulse (!) 56   Ht 5\' 9"  (1.753 m)   Wt 223 lb 3.2 oz (101.2 kg)   SpO2 97%   BMI 32.96 kg/m     Wt Readings from Last 3 Encounters:  05/20/23 223 lb 3.2 oz (101.2 kg)  03/17/23 217 lb (98.4 kg)  02/03/23 201 lb 9.6  oz (91.4 kg)    GEN:  Well nourished, well developed in no acute distress HEENT: Normal NECK: No JVD CARDIAC: RRR, no murmurs, rubs, gallops RESPIRATORY:  Clear to auscultation without rales, wheezing or rhonchi  ABDOMEN: Soft, non-tender, non-distended MUSCULOSKELETAL:  Trace left leg edema NEUROLOGIC:  Alert and oriented x 3 PSYCHIATRIC:  Normal affect   ASSESSMENT:    1. Essential hypertension   2. Nonrheumatic aortic valve insufficiency   3. Coronary artery disease involving native coronary artery of native heart without angina pectoris   4. Premature atrial contraction   5. PAF (paroxysmal atrial fibrillation) (HCC)     PLAN:     Pulmonary Embolism (PE) History of submassive PE in 2024 with subsequent improvement in RV function. Currently on Eliquis for anticoagulation. -Continue Eliquis as per primary; no plans to decrease to 2.5 mg dose  Atrial Fibrillation (Afib) - CHASDVASC NA for recurrent PE Paroxysmal Afib well controlled on Metoprolol 12.5mg  BID. Sinus bradycardia noted. Asymptomatic when it occurs -Continue Metoprolol 12.5mg  BID. -Monitor heart rate with Apple watch.  Non-obstructive Coronary Artery Disease Minimal disease noted on prior catheterization. Aspirin discontinued. -No changes to current management; well controlled on current statin  Hyperlipidemia - LDL at goal on Rosuvastatin 40mg .  Mild Aortic Regurgitation - echo in 2027 - no barriers to his vascular surgery  eval for abdominal aortic ectasia next week  Intermittent Lower Extremity Edema - Improvement noted, Lasix 20mg  PRN not needed.  Follow-up -See in clinic in 1 year. -Notify if Apple watch indicates low heart rate or increased Afib burden. -Notify if leg swelling worsens.      Medication Adjustments/Labs and Tests Ordered: Current medicines are reviewed at length with the patient today.  Concerns regarding medicines are outlined above.  No orders of the defined types were placed in this encounter.  No orders of the defined types were placed in this encounter.   Patient Instructions  Medication Instructions:   *If you need a refill on your cardiac medications before your next appointment, please call your pharmacy*   Lab Work:  If you have labs (blood work) drawn today and your tests are completely normal, you will receive your results only by: MyChart Message (if you have MyChart) OR A paper copy in the mail If you have any lab test that is abnormal or we need to change your treatment, we will call you to review the results.   Testing/Procedures:    Follow-Up: At [facility], you and your health needs are our priority.  As part of our continuing mission to provide you with exceptional heart care, we have created designated Provider Care Teams.  These Care Teams include your primary Cardiologist (physician) and Advanced Practice Providers (APPs -  Physician Assistants and Nurse Practitioners) who all work together to provide you with the care you need, when you need it.  We recommend signing up for the patient portal called "MyChart".  Sign up information is provided on this After Visit Summary.  MyChart is used to connect with patients for Virtual Visits (Telemedicine).  Patients are able to view lab/test results, encounter notes, upcoming appointments, etc.  Non-urgent messages can be sent to your provider as well.   To learn more about what you can do with MyChart,  go to [email or web address].    Your next appointment:   1 year(s)  Provider:   Christell Constant, MD     Other Instructions     Signed, Christell Constant, MD  05/20/2023 9:22 AM    [facility]

## 2023-05-20 ENCOUNTER — Encounter: Payer: Self-pay | Admitting: Internal Medicine

## 2023-05-20 ENCOUNTER — Other Ambulatory Visit: Payer: Self-pay | Admitting: Internal Medicine

## 2023-05-20 ENCOUNTER — Other Ambulatory Visit: Payer: Self-pay | Admitting: Cardiology

## 2023-05-20 ENCOUNTER — Ambulatory Visit: Payer: PPO | Attending: Internal Medicine | Admitting: Internal Medicine

## 2023-05-20 VITALS — BP 122/60 | HR 56 | Ht 69.0 in | Wt 223.2 lb

## 2023-05-20 DIAGNOSIS — I491 Atrial premature depolarization: Secondary | ICD-10-CM | POA: Diagnosis not present

## 2023-05-20 DIAGNOSIS — I2510 Atherosclerotic heart disease of native coronary artery without angina pectoris: Secondary | ICD-10-CM

## 2023-05-20 DIAGNOSIS — I351 Nonrheumatic aortic (valve) insufficiency: Secondary | ICD-10-CM

## 2023-05-20 DIAGNOSIS — I10 Essential (primary) hypertension: Secondary | ICD-10-CM

## 2023-05-20 DIAGNOSIS — I480 Paroxysmal atrial fibrillation: Secondary | ICD-10-CM

## 2023-05-20 DIAGNOSIS — I2602 Saddle embolus of pulmonary artery with acute cor pulmonale: Secondary | ICD-10-CM

## 2023-05-20 NOTE — Patient Instructions (Signed)
Medication Instructions:   *If you need a refill on your cardiac medications before your next appointment, please call your pharmacy*   Lab Work:  If you have labs (blood work) drawn today and your tests are completely normal, you will receive your results only by: MyChart Message (if you have MyChart) OR A paper copy in the mail If you have any lab test that is abnormal or we need to change your treatment, we will call you to review the results.   Testing/Procedures:    Follow-Up: At [facility], you and your health needs are our priority.  As part of our continuing mission to provide you with exceptional heart care, we have created designated Provider Care Teams.  These Care Teams include your primary Cardiologist (physician) and Advanced Practice Providers (APPs -  Physician Assistants and Nurse Practitioners) who all work together to provide you with the care you need, when you need it.  We recommend signing up for the patient portal called "MyChart".  Sign up information is provided on this After Visit Summary.  MyChart is used to connect with patients for Virtual Visits (Telemedicine).  Patients are able to view lab/test results, encounter notes, upcoming appointments, etc.  Non-urgent messages can be sent to your provider as well.   To learn more about what you can do with MyChart, go to [email or web address].    Your next appointment:   1 year(s)  Provider:   Christell Constant, MD     Other Instructions

## 2023-05-20 NOTE — Telephone Encounter (Signed)
Prescription refill request for Eliquis received. Indication: PE Last office visit: 05/20/23 Izora Ribas)  Scr: 1.12 (10/24/22)  Age: 82 Weight: 101.2kg  Appropriate dose. Refill sent.

## 2023-05-25 NOTE — Progress Notes (Unsigned)
Office Note     CC:  follow up Requesting Provider:  Thana Ates, MD  HPI: Ralph Blackwell is a 82 y.o. (01-Apr-1941) male who presents with his wife for close interval follow up after repair of right radial artery pseudoaneurysm, right radial thrombectomy by Dr Chestine Spore on 10/15/22. He also had Right Carpal Tunnel release and forearm fasciotomy with Dr. Frazier Butt on 10/15/22. He had several I&Ds with VAC changes while in the hospital and was ultimately discharged with [facility] on 10/27/22. Eventually he was transitioned to wet to dry dressing changes and at his last visit on 7/23 his wound was almost completely healed. Some silver nitrate was applied at his visit to help the granulation tissue in the wound bed.   Today he reports wound is doing well. Wound is now fully healed. He denies any pain in the right forearm or hand. However, he continues to have some weakness and numbness in right hand. This is unchanged. Also a feeling of fullness in the hand light it is tight. No real appreciable swelling. He is right handed and trying to use his arm and hand as much as possible but he is a little frustrated by the decreased sensation and strength. He as previously released from OT and admittedly says he has not been diligent about continuing to work through the exercises given to him.   The pt is on a statin for cholesterol management.  The pt is not on a daily aspirin.   Other AC:  Eliquis The pt is on BB for hypertension.   The pt not diabetic.   Tobacco hx:  never  Past Medical History:  Diagnosis Date   Coronary artery disease    GERD (gastroesophageal reflux disease)    Gout    History of hiatal hernia    Hypertension    Pulmonary embolism (HCC) 2008 after knee surgey   Renal insufficiency    hx renal failure 2008 after blood clots   Sleep apnea     Past Surgical History:  Procedure Laterality Date   APPLICATION OF WOUND VAC Right 10/20/2022   Procedure: WOUND VAC EXCHANGE;  Surgeon: Cephus Shelling, MD;  Location: [facility] OR;  Service: Vascular;  Laterality: Right;   arthroscopic knee surgery  2004   BACK SURGERY  nov 1979 and 1998   lower   COLONOSCOPY WITH PROPOFOL N/A 10/20/2016   Procedure: COLONOSCOPY WITH PROPOFOL;  Surgeon: Charolett Bumpers, MD;  Location: [facility];  Service: Endoscopy;  Laterality: N/A;   FASCIOTOMY Right 10/15/2022   Procedure: RIGHT FOREARM FASCIOTOMY, RADIAL ARTERY REPAIR, AND RADIAL ARTERY THROMBECOTOMY;  Surgeon: Marlyne Beards, MD;  Location: [facility] OR;  Service: Orthopedics;  Laterality: Right;   I & D EXTREMITY Right 10/17/2022   Procedure: IRRIGATION AND DEBRIDEMENT RIGHT ARM & WOUND VAC CHANGE;  Surgeon: Cephus Shelling, MD;  Location: [facility] OR;  Service: Vascular;  Laterality: Right;   I & D EXTREMITY Right 10/22/2022   Procedure: REPEAT IRRIGATION AND DEBRIDEMENT RIGHT ARM WITH WOUND VAC CHANGE WITH MYRIAD PLACEMENT;  Surgeon: Cephus Shelling, MD;  Location: [facility] OR;  Service: Vascular;  Laterality: Right;   INCISION AND DRAINAGE OF WOUND Right 10/20/2022   Procedure: IRRIGATION AND DEBRIDEMENT SKIN, SUBCUTANEOUS TISSUE AND MUSCLE WITH MYRIAD MORSELS AND DRAIN PLACEMENT;  Surgeon: Cephus Shelling, MD;  Location: [facility] OR;  Service: Vascular;  Laterality: Right;   IR ANGIOGRAM PULMONARY BILATERAL SELECTIVE  10/14/2022   IR ANGIOGRAM SELECTIVE EACH ADDITIONAL VESSEL  10/14/2022   IR ANGIOGRAM SELECTIVE EACH ADDITIONAL VESSEL  10/14/2022   IR INFUSION THROMBOL ARTERIAL INITIAL (MS)  10/14/2022   IR INFUSION THROMBOL ARTERIAL INITIAL (MS)  10/14/2022   IR THROMB F/U EVAL ART/VEN FINAL DAY (MS)  10/15/2022   IR US GUIDE VASC ACCESS RIGHT  10/14/2022   JOINT REPLACEMENT Left 2007   knee    LEFT HEART CATH AND CORONARY ANGIOGRAPHY N/A 06/30/2017   Procedure: LEFT HEART CATH AND CORONARY ANGIOGRAPHY;  Surgeon: Elder Negus, MD;  Location: [facility];  Service: Cardiovascular;  Laterality: N/A;   LEFT HEART CATH AND CORONARY ANGIOGRAPHY N/A  10/13/2022   Procedure: LEFT HEART CATH AND CORONARY ANGIOGRAPHY;  Surgeon: Elder Negus, MD;  Location: [facility];  Service: Cardiovascular;  Laterality: N/A;   left wrist plating   2005   titanium plate   right total knee replacement Right 2009    Social History   Socioeconomic History   Marital status: Married    Spouse name: Not on file   Number of children: 2   Years of education: Not on file   Highest education level: Not on file  Occupational History   Not on file  Tobacco Use   Smoking status: Never   Smokeless tobacco: Never  Vaping Use   Vaping status: Never Used  Substance and Sexual Activity   Alcohol use: No   Drug use: No   Sexual activity: Not on file  Other Topics Concern   Not on file  Social History Narrative   Not on file   Social Determinants of Health   Financial Resource Strain: Not on file  Food Insecurity: No Food Insecurity (10/13/2022)   Hunger Vital Sign    Worried About Running Out of Food in the Last Year: Never true    Ran Out of Food in the Last Year: Never true  Transportation Needs: No Transportation Needs (10/13/2022)   PRAPARE - Administrator, Civil Service (Medical): No    Lack of Transportation (Non-Medical): No  Physical Activity: Not on file  Stress: Not on file  Social Connections: Not on file  Intimate Partner Violence: Not At Risk (10/13/2022)   Humiliation, Afraid, Rape, and Kick questionnaire    Fear of Current or Ex-Partner: No    Emotionally Abused: No    Physically Abused: No    Sexually Abused: No    Family History  Problem Relation Age of Onset   Heart disease Mother    Hypertension Mother    Heart disease Father    Heart attack Father    Hypertension Father     Current Outpatient Medications  Medication Sig Dispense Refill   albuterol (VENTOLIN HFA) 108 (90 Base) MCG/ACT inhaler as needed for wheezing or shortness of breath.     allopurinol (ZYLOPRIM) 300 MG tablet Take 150 mg by  mouth daily.      apixaban (ELIQUIS) 5 MG TABS tablet TAKE 1 TABLET TWICE A DAY 180 tablet 1   ascorbic acid (VITAMIN C) 500 MG tablet Take 500 mg by mouth daily.     Cholecalciferol (VITAMIN D3) 5000 units CAPS Take 2,000 Units by mouth daily with supper.      finasteride (PROSCAR) 5 MG tablet Take 5 mg by mouth at bedtime.      fluticasone (FLONASE) 50 MCG/ACT nasal spray Place 1 spray into both nostrils every evening.     magnesium gluconate (MAGONATE) 500 MG tablet Take 500 mg  by mouth daily.     metoprolol tartrate (LOPRESSOR) 25 MG tablet Take 0.5 tablets (12.5 mg total) by mouth 2 (two) times daily. 90 tablet 3   omeprazole (PRILOSEC) 40 MG capsule TAKE 1 CAPSULE EVERY DAY 90 capsule 3   rosuvastatin (CRESTOR) 40 MG tablet Take 1 tablet (40 mg total) by mouth daily. 90 tablet 3   zinc gluconate 50 MG tablet Take 50 mg by mouth daily.     No current facility-administered medications for this visit.    No Known Allergies   REVIEW OF SYSTEMS:   [X]  denotes positive finding, [ ]  denotes negative finding Cardiac  Comments:  Chest pain or chest pressure:    Shortness of breath upon exertion:    Short of breath when lying flat:    Irregular heart rhythm:        Vascular    Pain in calf, thigh, or hip brought on by ambulation:    Pain in feet at night that wakes you up from your sleep:     Blood clot in your veins:    Leg swelling:         Pulmonary    Oxygen at home:    Productive cough:     Wheezing:         Neurologic    Sudden weakness in arms or legs:     Sudden numbness in arms or legs:     Sudden onset of difficulty speaking or slurred speech:    Temporary loss of vision in one eye:     Problems with dizziness:         Gastrointestinal    Blood in stool:     Vomited blood:         Genitourinary    Burning when urinating:     Blood in urine:        Psychiatric    Major depression:         Hematologic    Bleeding problems:    Problems with blood clotting  too easily:        Skin    Rashes or ulcers:        Constitutional    Fever or chills:      PHYSICAL EXAMINATION:  There were no vitals filed for this visit.   General:  WDWN in NAD; vital signs documented above Gait: Normal HENT: WNL, normocephalic Pulmonary: normal non-labored breathing , without wheezing Cardiac: regular HR Abdomen: soft, NT, no masses Vascular Exam/Pulses: 2+ right brachial, right radial and right ulnar pulses. Right hand warm and well perfused. Motor and sensation intact. 4/5 grip strength Extremities: without ischemic changes, without Gangrene , without cellulitis; without open wounds; right forearm fasciotomy site healed Musculoskeletal: no muscle wasting or atrophy  Neurologic: A&O X 3 Psychiatric:  The pt has Normal affect.   ASSESSMENT/PLAN:: 82 y.o. male here for follow up up after repair of right radial artery pseudoaneurysm, right radial thrombectomy by Dr Chestine Spore on 10/15/22. He also had Right Carpal Tunnel release and forearm fasciotomy with Dr. Frazier Butt on 10/15/22. His right forearm fasciotomy wounds are all healed now. Right arm is well perfused with palpable brachial, radial and ulnar pulses. Hand is warm. He is still having some weakness and numbness in the forearm and hand. Encourage him to continue to use his arm for ADLs as well as use the exercises recommend by OT. Provided reassurance that he could still have some improvement in the numbness and weakness from damage to  the nerves as this can take 6+ months to 1 year. However did explain that he could have permanent damage as well and that time would tell. They were very understanding of this. - He does not need any further follow up regarding his right arm/ hand. He knows to call if  he has any questions or concerns - He will keep follow up on 10/1 with Dr. Lenell Antu to routine surveillance follow up of right common iliac artery aneurysm   Leonie Douglas, PA-C [facility] [phone]  Clinic MD:  Steve Rattler

## 2023-05-26 ENCOUNTER — Encounter: Payer: Self-pay | Admitting: Vascular Surgery

## 2023-05-26 ENCOUNTER — Ambulatory Visit: Payer: PPO | Admitting: Vascular Surgery

## 2023-05-26 ENCOUNTER — Ambulatory Visit (HOSPITAL_COMMUNITY)
Admission: RE | Admit: 2023-05-26 | Discharge: 2023-05-26 | Disposition: A | Discharge status: Home / Self Care | Payer: PPO | Source: Ambulatory Visit | Attending: Vascular Surgery | Admitting: Vascular Surgery

## 2023-05-26 VITALS — BP 134/70 | HR 61 | Temp 97.8°F | Resp 20 | Ht 69.0 in | Wt 218.0 lb

## 2023-05-26 DIAGNOSIS — I77811 Abdominal aortic ectasia: Secondary | ICD-10-CM | POA: Insufficient documentation

## 2023-06-15 DIAGNOSIS — L820 Inflamed seborrheic keratosis: Secondary | ICD-10-CM | POA: Diagnosis not present

## 2023-06-15 DIAGNOSIS — L570 Actinic keratosis: Secondary | ICD-10-CM | POA: Diagnosis not present

## 2023-07-06 DIAGNOSIS — G4733 Obstructive sleep apnea (adult) (pediatric): Secondary | ICD-10-CM | POA: Diagnosis not present

## 2023-08-22 ENCOUNTER — Other Ambulatory Visit: Payer: Self-pay | Admitting: Cardiology

## 2023-08-24 ENCOUNTER — Telehealth: Payer: Self-pay | Admitting: Internal Medicine

## 2023-08-24 MED ORDER — ROSUVASTATIN CALCIUM 40 MG PO TABS: 40.0000 mg | ORAL_TABLET | Freq: Every day | ORAL | 3 refills | Status: DC

## 2023-08-24 NOTE — Telephone Encounter (Signed)
 Pt c/o medication issue:  1. Name of Medication: rosuvastatin  (CRESTOR ) 40 MG tablet   2. How are you currently taking this medication (dosage and times per day)?   3. Are you having a reaction (difficulty breathing--STAT)? No   4. What is your medication issue? Pt got a call from nurse today. There was a mix up with the RX and would like to clarify how he should be taking this med.

## 2023-08-24 NOTE — Telephone Encounter (Signed)
 Spoke with patient and he states he needs refill on his crestor  40 mg. States [facility] filled an old prescription for 20 mg and he didn't notice until now.   Rx for 40 mg of Crestor  sent to pharmacy.

## 2023-08-24 NOTE — Telephone Encounter (Signed)
 The patient was told to verfiy what his dose of rosuvastatin  is that he is currently taking, the patient said he is taking 40 mg daily. The patient checked the bottle of what he had last filled and he said that the pharmacy filled a previous prescription that was for 20 mg daily. The patient was told that he has refills until June for the 40 mg rosuvastatin  and that I would let the pharmacy know to d/c the 20 mg rosuvastatin  from his pharmacy profile.

## 2023-09-25 ENCOUNTER — Other Ambulatory Visit: Payer: Self-pay | Admitting: Internal Medicine

## 2023-09-25 DIAGNOSIS — I480 Paroxysmal atrial fibrillation: Secondary | ICD-10-CM

## 2023-09-25 DIAGNOSIS — I2602 Saddle embolus of pulmonary artery with acute cor pulmonale: Secondary | ICD-10-CM

## 2023-09-25 NOTE — Telephone Encounter (Signed)
 Eliquis 5mg  refill request received. Patient is 83 years old, weight-98.9kg, Crea-1.12 on 10/24/22, Diagnosis-PE & Afib, and last seen by Dr. Izora Ribas on 05/20/23. Dose is appropriate based on dosing criteria. Will send in refill to requested pharmacy.

## 2023-09-29 DIAGNOSIS — M7062 Trochanteric bursitis, left hip: Secondary | ICD-10-CM | POA: Diagnosis not present

## 2023-10-06 DIAGNOSIS — H01002 Unspecified blepharitis right lower eyelid: Secondary | ICD-10-CM | POA: Diagnosis not present

## 2023-10-06 DIAGNOSIS — H01005 Unspecified blepharitis left lower eyelid: Secondary | ICD-10-CM | POA: Diagnosis not present

## 2023-10-06 DIAGNOSIS — G4733 Obstructive sleep apnea (adult) (pediatric): Secondary | ICD-10-CM | POA: Diagnosis not present

## 2023-11-18 ENCOUNTER — Other Ambulatory Visit: Payer: Self-pay | Admitting: Internal Medicine

## 2023-11-19 DIAGNOSIS — L578 Other skin changes due to chronic exposure to nonionizing radiation: Secondary | ICD-10-CM | POA: Diagnosis not present

## 2023-11-19 DIAGNOSIS — D229 Melanocytic nevi, unspecified: Secondary | ICD-10-CM | POA: Diagnosis not present

## 2023-11-19 DIAGNOSIS — L821 Other seborrheic keratosis: Secondary | ICD-10-CM | POA: Diagnosis not present

## 2023-11-19 DIAGNOSIS — L570 Actinic keratosis: Secondary | ICD-10-CM | POA: Diagnosis not present

## 2023-11-19 DIAGNOSIS — D1801 Hemangioma of skin and subcutaneous tissue: Secondary | ICD-10-CM | POA: Diagnosis not present

## 2023-11-19 DIAGNOSIS — L814 Other melanin hyperpigmentation: Secondary | ICD-10-CM | POA: Diagnosis not present

## 2023-12-12 DIAGNOSIS — N400 Enlarged prostate without lower urinary tract symptoms: Secondary | ICD-10-CM | POA: Diagnosis not present

## 2023-12-12 DIAGNOSIS — I2510 Atherosclerotic heart disease of native coronary artery without angina pectoris: Secondary | ICD-10-CM | POA: Diagnosis not present

## 2023-12-14 ENCOUNTER — Other Ambulatory Visit: Payer: Self-pay | Admitting: Internal Medicine

## 2023-12-17 DIAGNOSIS — R059 Cough, unspecified: Secondary | ICD-10-CM | POA: Diagnosis not present

## 2023-12-17 DIAGNOSIS — R0981 Nasal congestion: Secondary | ICD-10-CM | POA: Diagnosis not present

## 2023-12-21 ENCOUNTER — Telehealth: Payer: Self-pay | Admitting: Internal Medicine

## 2023-12-21 DIAGNOSIS — I2510 Atherosclerotic heart disease of native coronary artery without angina pectoris: Secondary | ICD-10-CM

## 2023-12-21 DIAGNOSIS — E782 Mixed hyperlipidemia: Secondary | ICD-10-CM

## 2023-12-21 MED ORDER — METOPROLOL TARTRATE 25 MG PO TABS: 12.5000 mg | ORAL_TABLET | Freq: Two times a day (BID) | ORAL | 1 refills | Status: DC

## 2023-12-21 NOTE — Telephone Encounter (Signed)
 Pt c/o medication issue:  1. Name of Medication:   rosuvastatin  (CRESTOR ) 40 MG tablet   2. How are you currently taking this medication (dosage and times per day)?   As prescribed  3. Are you having a reaction (difficulty breathing--STAT)?   4. What is your medication issue?   Patient stated he has been having some muscle weakness with this medication.  Patient wants a call back to discuss dosage change.

## 2023-12-21 NOTE — Telephone Encounter (Signed)
*  STAT* If patient is at the pharmacy, call can be transferred to refill team.   1. Which medications need to be refilled? (please list name of each medication and dose if known)   metoprolol  tartrate (LOPRESSOR ) 25 MG tablet   2. Would you like to learn more about the convenience, safety, & potential cost savings by using the [facility]?   3. Are you open to using the [facility] (Type [facility]. ).  4. Which pharmacy/location (including street and city if local pharmacy) is medication to be sent to?  [id number] - [place], [place] - [address]   5. Do they need a 30 day or 90 day supply?   90 day  Patient stated he only has 1 day left of this medication.

## 2023-12-21 NOTE — Telephone Encounter (Signed)
 Patient identification verified by 2 forms.   Called and spoke to patient  Patient states:  -Last visit his medication as increased and is more pronounced now -"I'm not sure if it's age related or the medication. I just wanted to make note so we can look into it."  -"Normally I can work through it but it's getting harder to do" -Pt is taking his medication at night and with at least 8 ounces or water.  Patient denies:  -Being about to "work through it"             Interventions/Plan: -Will get message to Dr. Paulita Boss for advisement.   Patient agrees with plan, no questions at this time

## 2023-12-21 NOTE — Telephone Encounter (Signed)
 Called spoke with pt.  Reports increased muscle aches and weakness since rosuvastatin  dose was increased to 40 mg.  Also reports noted symptoms since had blood clots a year ago Easter.  Was walking 3 miles daily prior to having blood clots now can only walk 1 mile.   MD spoke with pt advised pt to stop rosuvastatin  and have f/u fasting blood work in about 1 month.  Pt is agreeable to plan expresses understanding. Rosuvastatin  removed from med list order for lipid panel placed and released.

## 2023-12-21 NOTE — Telephone Encounter (Signed)
 Pt's medication was sent to pt's pharmacy as requested. Confirmation received.

## 2023-12-25 DIAGNOSIS — G4733 Obstructive sleep apnea (adult) (pediatric): Secondary | ICD-10-CM | POA: Diagnosis not present

## 2024-01-11 DIAGNOSIS — I2510 Atherosclerotic heart disease of native coronary artery without angina pectoris: Secondary | ICD-10-CM | POA: Diagnosis not present

## 2024-01-11 DIAGNOSIS — N400 Enlarged prostate without lower urinary tract symptoms: Secondary | ICD-10-CM | POA: Diagnosis not present

## 2024-01-19 DIAGNOSIS — M25552 Pain in left hip: Secondary | ICD-10-CM | POA: Diagnosis not present

## 2024-01-19 DIAGNOSIS — M545 Low back pain, unspecified: Secondary | ICD-10-CM | POA: Diagnosis not present

## 2024-01-21 DIAGNOSIS — I2510 Atherosclerotic heart disease of native coronary artery without angina pectoris: Secondary | ICD-10-CM | POA: Diagnosis not present

## 2024-01-21 DIAGNOSIS — E782 Mixed hyperlipidemia: Secondary | ICD-10-CM | POA: Diagnosis not present

## 2024-01-21 LAB — LIPID PANEL
Chol/HDL Ratio: 3.6 ratio (ref 0.0–5.0)
Cholesterol, Total: 217 mg/dL — ABNORMAL HIGH (ref 100–199)
HDL: 60 mg/dL (ref >39)
LDL Chol Calc (NIH): 148 mg/dL — ABNORMAL HIGH (ref 0–99)
Triglycerides: 52 mg/dL (ref 0–149)
VLDL Cholesterol Cal: 9 mg/dL (ref 5–40)

## 2024-01-22 ENCOUNTER — Ambulatory Visit: Payer: Self-pay | Admitting: *Deleted

## 2024-01-22 DIAGNOSIS — I2510 Atherosclerotic heart disease of native coronary artery without angina pectoris: Secondary | ICD-10-CM

## 2024-01-22 DIAGNOSIS — E782 Mixed hyperlipidemia: Secondary | ICD-10-CM

## 2024-01-25 MED ORDER — EZETIMIBE 10 MG PO TABS: 10.0000 mg | ORAL_TABLET | Freq: Every day | ORAL | 3 refills | Status: AC

## 2024-02-11 DIAGNOSIS — N400 Enlarged prostate without lower urinary tract symptoms: Secondary | ICD-10-CM | POA: Diagnosis not present

## 2024-02-11 DIAGNOSIS — I2510 Atherosclerotic heart disease of native coronary artery without angina pectoris: Secondary | ICD-10-CM | POA: Diagnosis not present

## 2024-03-13 DIAGNOSIS — N400 Enlarged prostate without lower urinary tract symptoms: Secondary | ICD-10-CM | POA: Diagnosis not present

## 2024-03-13 DIAGNOSIS — I2510 Atherosclerotic heart disease of native coronary artery without angina pectoris: Secondary | ICD-10-CM | POA: Diagnosis not present

## 2024-03-17 DIAGNOSIS — Z1331 Encounter for screening for depression: Secondary | ICD-10-CM | POA: Diagnosis not present

## 2024-03-17 DIAGNOSIS — Z86711 Personal history of pulmonary embolism: Secondary | ICD-10-CM | POA: Diagnosis not present

## 2024-03-17 DIAGNOSIS — K219 Gastro-esophageal reflux disease without esophagitis: Secondary | ICD-10-CM | POA: Diagnosis not present

## 2024-03-17 DIAGNOSIS — D649 Anemia, unspecified: Secondary | ICD-10-CM | POA: Diagnosis not present

## 2024-03-17 DIAGNOSIS — G4733 Obstructive sleep apnea (adult) (pediatric): Secondary | ICD-10-CM | POA: Diagnosis not present

## 2024-03-17 DIAGNOSIS — R7303 Prediabetes: Secondary | ICD-10-CM | POA: Diagnosis not present

## 2024-03-17 DIAGNOSIS — Z23 Encounter for immunization: Secondary | ICD-10-CM | POA: Diagnosis not present

## 2024-03-17 DIAGNOSIS — Z0000 Encounter for general adult medical examination without abnormal findings: Secondary | ICD-10-CM | POA: Diagnosis not present

## 2024-03-17 DIAGNOSIS — E559 Vitamin D deficiency, unspecified: Secondary | ICD-10-CM | POA: Diagnosis not present

## 2024-03-17 DIAGNOSIS — I2510 Atherosclerotic heart disease of native coronary artery without angina pectoris: Secondary | ICD-10-CM | POA: Diagnosis not present

## 2024-03-17 DIAGNOSIS — M109 Gout, unspecified: Secondary | ICD-10-CM | POA: Diagnosis not present

## 2024-03-17 DIAGNOSIS — E7800 Pure hypercholesterolemia, unspecified: Secondary | ICD-10-CM | POA: Diagnosis not present

## 2024-03-17 DIAGNOSIS — I723 Aneurysm of iliac artery: Secondary | ICD-10-CM | POA: Diagnosis not present

## 2024-03-17 DIAGNOSIS — N400 Enlarged prostate without lower urinary tract symptoms: Secondary | ICD-10-CM | POA: Diagnosis not present

## 2024-03-24 DIAGNOSIS — G4733 Obstructive sleep apnea (adult) (pediatric): Secondary | ICD-10-CM | POA: Diagnosis not present

## 2024-04-12 DIAGNOSIS — N400 Enlarged prostate without lower urinary tract symptoms: Secondary | ICD-10-CM | POA: Diagnosis not present

## 2024-04-12 DIAGNOSIS — I2510 Atherosclerotic heart disease of native coronary artery without angina pectoris: Secondary | ICD-10-CM | POA: Diagnosis not present

## 2024-04-14 DIAGNOSIS — M65332 Trigger finger, left middle finger: Secondary | ICD-10-CM | POA: Diagnosis not present

## 2024-04-14 DIAGNOSIS — M25531 Pain in right wrist: Secondary | ICD-10-CM | POA: Diagnosis not present

## 2024-04-28 DIAGNOSIS — I2510 Atherosclerotic heart disease of native coronary artery without angina pectoris: Secondary | ICD-10-CM | POA: Diagnosis not present

## 2024-05-01 ENCOUNTER — Emergency Department (HOSPITAL_COMMUNITY)
Admission: EM | Admit: 2024-05-01 | Discharge: 2024-05-01 | Disposition: A | Discharge status: Home / Self Care | Attending: Emergency Medicine | Admitting: Emergency Medicine

## 2024-05-01 ENCOUNTER — Emergency Department (HOSPITAL_COMMUNITY)

## 2024-05-01 ENCOUNTER — Encounter (HOSPITAL_COMMUNITY): Payer: Self-pay

## 2024-05-01 ENCOUNTER — Other Ambulatory Visit: Payer: Self-pay

## 2024-05-01 DIAGNOSIS — R1032 Left lower quadrant pain: Secondary | ICD-10-CM | POA: Diagnosis present

## 2024-05-01 DIAGNOSIS — Z86711 Personal history of pulmonary embolism: Secondary | ICD-10-CM | POA: Diagnosis not present

## 2024-05-01 DIAGNOSIS — K5732 Diverticulitis of large intestine without perforation or abscess without bleeding: Secondary | ICD-10-CM | POA: Insufficient documentation

## 2024-05-01 DIAGNOSIS — I7143 Infrarenal abdominal aortic aneurysm, without rupture: Secondary | ICD-10-CM | POA: Diagnosis not present

## 2024-05-01 DIAGNOSIS — I2510 Atherosclerotic heart disease of native coronary artery without angina pectoris: Secondary | ICD-10-CM | POA: Diagnosis not present

## 2024-05-01 DIAGNOSIS — N400 Enlarged prostate without lower urinary tract symptoms: Secondary | ICD-10-CM | POA: Diagnosis not present

## 2024-05-01 DIAGNOSIS — K5792 Diverticulitis of intestine, part unspecified, without perforation or abscess without bleeding: Secondary | ICD-10-CM | POA: Diagnosis not present

## 2024-05-01 DIAGNOSIS — R109 Unspecified abdominal pain: Secondary | ICD-10-CM | POA: Diagnosis not present

## 2024-05-01 DIAGNOSIS — K8020 Calculus of gallbladder without cholecystitis without obstruction: Secondary | ICD-10-CM | POA: Diagnosis not present

## 2024-05-01 DIAGNOSIS — Z79899 Other long term (current) drug therapy: Secondary | ICD-10-CM | POA: Diagnosis not present

## 2024-05-01 DIAGNOSIS — I10 Essential (primary) hypertension: Secondary | ICD-10-CM | POA: Insufficient documentation

## 2024-05-01 DIAGNOSIS — Z7901 Long term (current) use of anticoagulants: Secondary | ICD-10-CM | POA: Diagnosis not present

## 2024-05-01 LAB — COMPREHENSIVE METABOLIC PANEL WITH GFR
ALT: 17 U/L (ref 0–44)
AST: 24 U/L (ref 15–41)
Albumin: 3.7 g/dL (ref 3.5–5.0)
Alkaline Phosphatase: 47 U/L (ref 38–126)
Anion gap: 12 (ref 5–15)
BUN: 33 mg/dL — ABNORMAL HIGH (ref 8–23)
CO2: 24 mmol/L (ref 22–32)
Calcium: 9.1 mg/dL (ref 8.9–10.3)
Chloride: 103 mmol/L (ref 98–111)
Creatinine, Ser: 1.24 mg/dL (ref 0.61–1.24)
GFR, Estimated: 58 mL/min — ABNORMAL LOW (ref >60)
Glucose, Bld: 85 mg/dL (ref 70–99)
Potassium: 4.4 mmol/L (ref 3.5–5.1)
Sodium: 139 mmol/L (ref 135–145)
Total Bilirubin: 0.7 mg/dL (ref 0.0–1.2)
Total Protein: 6.7 g/dL (ref 6.5–8.1)

## 2024-05-01 LAB — CBC WITH DIFFERENTIAL/PLATELET
Abs Immature Granulocytes: 0.02 K/uL (ref 0.00–0.07)
Basophils Absolute: 0 K/uL (ref 0.0–0.1)
Basophils Relative: 0 %
Eosinophils Absolute: 0.1 K/uL (ref 0.0–0.5)
Eosinophils Relative: 1 %
HCT: 42.4 % (ref 39.0–52.0)
Hemoglobin: 13.3 g/dL (ref 13.0–17.0)
Immature Granulocytes: 0 %
Lymphocytes Relative: 20 %
Lymphs Abs: 1.7 K/uL (ref 0.7–4.0)
MCH: 28.5 pg (ref 26.0–34.0)
MCHC: 31.4 g/dL (ref 30.0–36.0)
MCV: 90.8 fL (ref 80.0–100.0)
Monocytes Absolute: 0.8 K/uL (ref 0.1–1.0)
Monocytes Relative: 10 %
Neutro Abs: 5.9 K/uL (ref 1.7–7.7)
Neutrophils Relative %: 69 %
Platelets: 141 K/uL — ABNORMAL LOW (ref 150–400)
RBC: 4.67 MIL/uL (ref 4.22–5.81)
RDW: 15.6 % — ABNORMAL HIGH (ref 11.5–15.5)
WBC: 8.6 K/uL (ref 4.0–10.5)
nRBC: 0 % (ref 0.0–0.2)

## 2024-05-01 LAB — I-STAT CHEM 8, ED
BUN: 36 mg/dL — ABNORMAL HIGH (ref 8–23)
Calcium, Ion: 1.2 mmol/L (ref 1.15–1.40)
Chloride: 105 mmol/L (ref 98–111)
Creatinine, Ser: 1.2 mg/dL (ref 0.61–1.24)
Glucose, Bld: 84 mg/dL (ref 70–99)
HCT: 42 % (ref 39.0–52.0)
Hemoglobin: 14.3 g/dL (ref 13.0–17.0)
Potassium: 4.5 mmol/L (ref 3.5–5.1)
Sodium: 141 mmol/L (ref 135–145)
TCO2: 24 mmol/L (ref 22–32)

## 2024-05-01 MED ORDER — IOHEXOL 350 MG/ML SOLN
75.0000 mL | Freq: Once | INTRAVENOUS | Status: AC | PRN
  Administered 2024-05-01: 75 mL via INTRAVENOUS

## 2024-05-01 MED ORDER — AMOXICILLIN-POT CLAVULANATE 875-125 MG PO TABS: 1.0000 | ORAL_TABLET | Freq: Two times a day (BID) | ORAL | 0 refills | Status: DC

## 2024-05-01 MED ORDER — AMOXICILLIN-POT CLAVULANATE 875-125 MG PO TABS
1.0000 | ORAL_TABLET | Freq: Once | ORAL | Status: AC
  Administered 2024-05-01: 1 via ORAL
  Filled 2024-05-01: qty 1

## 2024-05-01 NOTE — ED Triage Notes (Signed)
 Complains of left lower and upper quad pain that started today with hx of diverticulitis and blood clots and on blood thinners.  +nausea 2 Bms today that were normal.

## 2024-05-01 NOTE — ED Triage Notes (Signed)
 Pt here from home with abd pain , pt went to UC and was sent to ED

## 2024-05-01 NOTE — ED Provider Notes (Signed)
 [place] EMERGENCY DEPARTMENT AT [facility] Provider Note   CSN: [id number] Arrival date & time: 05/01/24  1800     Patient presents with: Abdominal Pain   Ralph Blackwell is a 83 y.o. male.   Patient is an 83 year old male with a history of CAD, PE on Eliquis , hypertension, prior renal insufficiency and GERD with a history of diverticulitis in the past who is presenting today with left-sided abdominal pain.  He reports it started earlier today and is just worsened throughout the day.  Eating does not really seem to make it worse but any type of movement or bending seems to make it worse.  He is having bowel movements normally and denies any urinary complaints.  He has not had fever but has had occasional nausea.  He denies any prior abdominal surgeries.  The history is provided by the patient, the spouse and medical records.  Abdominal Pain      Prior to Admission medications   Medication Sig Start Date End Date Taking? Authorizing Provider  amoxicillin-clavulanate (AUGMENTIN) 875-125 MG tablet Take 1 tablet by mouth every 12 (twelve) hours. 05/01/24  Yes Kolbee Bogusz, Benton, MD  albuterol (VENTOLIN HFA) 108 (90 Base) MCG/ACT inhaler as needed for wheezing or shortness of breath. 04/27/23   [provider]  allopurinol  (ZYLOPRIM ) 300 MG tablet Take 150 mg by mouth daily.     [provider]  ascorbic acid (VITAMIN C) 500 MG tablet Take 500 mg by mouth daily.    [provider]  Cholecalciferol (VITAMIN D3) 5000 units CAPS Take 2,000 Units by mouth daily with supper.     [provider]  ELIQUIS  5 MG TABS tablet TAKE 1 TABLET BY MOUTH TWICE A DAY 09/25/23   Chandrasekhar, Mahesh A, MD  ezetimibe  (ZETIA ) 10 MG tablet Take 1 tablet (10 mg total) by mouth daily. 01/25/24   Chandrasekhar, Stanly LABOR, MD  finasteride  (PROSCAR ) 5 MG tablet Take 5 mg by mouth at bedtime.     [provider]  fluticasone (FLONASE) 50 MCG/ACT nasal spray  Place 1 spray into both nostrils every evening.    [provider]  magnesium gluconate (MAGONATE) 500 MG tablet Take 500 mg by mouth daily.    [provider]  metoprolol  tartrate (LOPRESSOR ) 25 MG tablet Take 0.5 tablets (12.5 mg total) by mouth 2 (two) times daily. 12/21/23   Santo Stanly LABOR, MD  omeprazole  (PRILOSEC) 40 MG capsule TAKE 1 CAPSULE EVERY DAY 05/20/23   Chandrasekhar, Mahesh A, MD  zinc gluconate 50 MG tablet Take 50 mg by mouth daily.    [provider]    Allergies: Patient has no known allergies.    Review of Systems  Gastrointestinal:  Positive for abdominal pain.    Updated Vital Signs BP 126/67 (BP Location: Left Arm)   Pulse 68   Temp 99.2 F (37.3 C)   Resp 17   Ht 5' 9 (1.753 m)   Wt 98.9 kg   SpO2 94%   BMI 32.20 kg/m   Physical Exam Vitals and nursing note reviewed.  Constitutional:      General: He is not in acute distress.    Appearance: He is well-developed.  HENT:     Head: Normocephalic and atraumatic.  Eyes:     Conjunctiva/sclera: Conjunctivae normal.     Pupils: Pupils are equal, round, and reactive to light.  Cardiovascular:     Rate and Rhythm: Normal rate and regular rhythm.  Heart sounds: No murmur heard. Pulmonary:     Effort: Pulmonary effort is normal. No respiratory distress.     Breath sounds: Normal breath sounds. No wheezing or rales.  Abdominal:     General: There is no distension.     Palpations: Abdomen is soft.     Tenderness: There is abdominal tenderness in the left lower quadrant. There is no left CVA tenderness, guarding or rebound.  Musculoskeletal:        General: No tenderness. Normal range of motion.     Cervical back: Normal range of motion and neck supple.     Right lower leg: No edema.     Left lower leg: No edema.  Skin:    General: Skin is warm and dry.     Findings: No erythema or rash.  Neurological:     Mental Status: He is alert and oriented to person, place,  and time.  Psychiatric:        Behavior: Behavior normal.     (all labs ordered are listed, but only abnormal results are displayed) Labs Reviewed  CBC WITH DIFFERENTIAL/PLATELET - Abnormal; Notable for the following components:      Result Value   RDW 15.6 (*)    Platelets 141 (*)    All other components within normal limits  COMPREHENSIVE METABOLIC PANEL WITH GFR - Abnormal; Notable for the following components:   BUN 33 (*)    GFR, Estimated 58 (*)    All other components within normal limits  I-STAT CHEM 8, ED - Abnormal; Notable for the following components:   BUN 36 (*)    All other components within normal limits  URINALYSIS, W/ REFLEX TO CULTURE (INFECTION SUSPECTED)    EKG: None  Radiology: CT ABDOMEN PELVIS W CONTRAST Result Date: 05/01/2024 EXAM: CT ABDOMEN AND PELVIS WITH CONTRAST 05/01/2024 07:58:04 PM TECHNIQUE: CT of the abdomen and pelvis was performed with the administration of 75 mL of iohexol  (OMNIPAQUE ) 350 MG/ML injection. Multiplanar reformatted images are provided for review. Automated exposure control, iterative reconstruction, and/or weight-based adjustment of the mA/kV was utilized to reduce the radiation dose to as low as reasonably achievable. COMPARISON: Previous CTs with contrast dated 03/12/2023 and 07/19/2015. CLINICAL HISTORY: LLQ abdominal pain. FINDINGS: LOWER CHEST: Scarring changes in the lung bases without infiltrates. Upper limit of normal cardiac size. Small pericardial sleeve recess effusion and small hiatal hernia containing fat. LIVER: The liver is mildly steatotic without mass enhancement. GALLBLADDER AND BILE DUCTS: Multiple tiny stones again noted in the distal gallbladder. No wall thickening or bile duct dilatation. SPLEEN: No acute abnormality. PANCREAS: Fatty atrophy noted in the pancreatic head, uncinate process, and neck. No mass. No ductal dilatation. ADRENAL GLANDS: No acute abnormality. KIDNEYS, URETERS AND BLADDER: Scattered  bilateral subcentimeter Bosniak 2 cyst renal cortical cysts are noted and too small to characterize. No follow-up imaging is recommended. No overt interval change. There is no urinary stone or obstruction. The bladder is mildly thickened probably due to muscular hypertrophy due to impression by the enlarged prostate with median lobe hypertrophy. Cystitis may appear similar. There is symmetric renal excretion on the delayed images. GI AND BOWEL: Stable appearance of the contracted stomach. No wall thickening or dilatation in the small bowel and appendix. There is mild to moderate retained stool in the ascending and transverse colon and cecum. There is diffuse colonic diverticulosis, heaviest in the descending and sigmoid segments. There is focal diverticulitis in the mid descending colon over a short segment, with  localized wall thickening and inflammatory stranding. There is no free fluid or abscess. Other diverticula are uncomplicated. PERITONEUM AND RETROPERITONEUM: No ascites. No free air. VASCULATURE: Moderate to heavy aortoiliac calcification. Stable fusiform distal infrarenal AAA measuring 3.1 cm. Also stable is a 2.5 cm fusiform aneurysm of the distal right common iliac artery. There is no aortoiliac dissection or stenosis. LYMPH NODES: No lymphadenopathy. REPRODUCTIVE ORGANS: Enlarged prostate, 5.7 cm transverse with median lobe hypertrophy and pressing into the bladder base. BONES AND SOFT TISSUES: Osteopenia with advanced degenerative change of the lumbar spine and mild levoscoliosis. No acute or other significant osseous findings. There is a small inguinal hernia containing fat. Numerous pelvic phleboliths. No focal soft tissue abnormality. IMPRESSION: 1. Focal diverticulitis in the mid descending colon with localized wall thickening and inflammatory stranding, without free fluid or abscess. Colonoscopy follow-up recommended after treatment. 2. Stable fusiform distal infrarenal abdominal aortic aneurysm  measuring 3.1 cm and stable 2.5 cm fusiform aneurysm of the distal right common iliac artery, without aortoiliac dissection or stenosis. SABRA Ultrasound follow-up at 3-year intervals recommended. 3. Advanced degenerative changes of the lumbar spine. 4. Cholelithiasis. 5. Enlarged prostate impressing into the bladder with mild bladder thickening which is probably due to hypertrophy, less likely cystitis. Electronically signed by: Francis Quam MD 05/01/2024 08:17 PM EDT RP Workstation: [id number]     Procedures   Medications Ordered in the ED  amoxicillin-clavulanate (AUGMENTIN) 875-125 MG per tablet 1 tablet (has no administration in time range)  iohexol  (OMNIPAQUE ) 350 MG/ML injection 75 mL (75 mLs Intravenous Contrast Given 05/01/24 1958)                                    Medical Decision Making Amount and/or Complexity of Data Reviewed Independent Historian: spouse External Data Reviewed: notes. Labs: ordered. Decision-making details documented in ED Course. Radiology: ordered and independent interpretation performed. Decision-making details documented in ED Course.  Risk Prescription drug management.   Pt with multiple medical problems and comorbidities and presenting today with a complaint that caries a high risk for morbidity and mortality.  Here today with complaint of left-sided abdominal pain that is worsened over the day.  Concern for kidney stone, diverticulitis, small perforation.  No evidence of shingles and no evidence of hernia on exam.  He denies any urinary symptoms and low concern for a UTI or pyelonephritis at this time.  Patient does take Eliquis  regularly and low suspicion for dissection or AAA.  He is well-appearing on exam with normal vital signs. I dependently interpreted patient's labs and CBC, Chem-8, CMP all without acute findings.  I have independently visualized and interpreted pt's images today.  CT without evidence of obstruction, hydronephrosis or renal stone.   Radiology report focal diverticulitis in the mid descending colon with localized wall thickening and inflammatory stranding without free fluid or abscess.  They do recommend colonoscopy follow-up.  He does have a stable fusiform distal infrarenal AAA measuring 3.1 cm and is stable aneurysm of the common iliac which they recommend ultrasound in 3-year intervals and cholelithiasis without complicating features.  Findings discussed with the patient.  No indication for admission at this time.  Will start on Augmentin encouraged ongoing pain control.  He was given return precautions.       Final diagnoses:  Diverticulitis of large intestine without perforation or abscess without bleeding    ED Discharge Orders  Ordered    amoxicillin-clavulanate (AUGMENTIN) 875-125 MG tablet  Every 12 hours        05/01/24 2041               Doretha Folks, MD 05/01/24 2043

## 2024-05-01 NOTE — ED Provider Triage Note (Signed)
 Emergency Medicine Provider Triage Evaluation Note  MAIKA KACZMAREK , a 83 y.o. male  was evaluated in triage.  Pt complains of patient presenting today with sudden onset of pain in his left lower abdomen that is gradually worsened in the last 6 hours.  Mild nausea but no vomiting.  No change in bowel movements or urinary habits.  No prior abdominal surgeries or similar symptoms in the past.  Review of Systems  Positive: Abdominal pain Negative: No fever, diarrhea, urinary or bowel changes  Physical Exam  BP 126/67 (BP Location: Left Arm)   Pulse 68   Temp 99.2 F (37.3 C)   Resp 17   Ht 5' 9 (1.753 m)   Wt 98.9 kg   SpO2 94%   BMI 32.20 kg/m  Gen:   Awake, no distress   Resp:  Normal effort  MSK:   Moves extremities without difficulty  Other:  Left lower quadrant abdominal pain with minimal guarding.  No rebound.  No flank pain  Medical Decision Making  Medically screening exam initiated at 6:59 PM.  Appropriate orders placed.  Carlin LITTIE Dawn was informed that the remainder of the evaluation will be completed by another provider, this initial triage assessment does not replace that evaluation, and the importance of remaining in the ED until their evaluation is complete.     Doretha Folks, MD 05/01/24 1900

## 2024-05-01 NOTE — Discharge Instructions (Addendum)
 Your lab work looked normal today but your scan did show a area of diverticulitis without any complications.  You were started on an antibiotic which she will take your next dose tomorrow.  Make sure you are sticking with a bland diet and having regular bowel movements.  If you are not having regular bowel movements you should start a stool softener to make sure they are regular.  If you start having severe abdominal pain, vomiting or fever return to the emergency room.  Use Tylenol  as needed for pain.

## 2024-05-04 ENCOUNTER — Other Ambulatory Visit: Payer: Self-pay

## 2024-05-04 DIAGNOSIS — I2602 Saddle embolus of pulmonary artery with acute cor pulmonale: Secondary | ICD-10-CM

## 2024-05-04 DIAGNOSIS — I480 Paroxysmal atrial fibrillation: Secondary | ICD-10-CM

## 2024-05-04 MED ORDER — APIXABAN 5 MG PO TABS: 5.0000 mg | ORAL_TABLET | Freq: Two times a day (BID) | ORAL | 1 refills | Status: AC

## 2024-05-04 NOTE — Telephone Encounter (Signed)
 Pt last saw Dr Santo 05/20/23, last labs 05/01/24 Creat 1.24, age 83, weight 98.9kg, based on specified criteria pt is on appropriate dosage of Eliquis  5mg  BID for afib.  Will refill rx.

## 2024-05-13 DIAGNOSIS — K5792 Diverticulitis of intestine, part unspecified, without perforation or abscess without bleeding: Secondary | ICD-10-CM | POA: Diagnosis not present

## 2024-05-13 DIAGNOSIS — N400 Enlarged prostate without lower urinary tract symptoms: Secondary | ICD-10-CM | POA: Diagnosis not present

## 2024-05-13 DIAGNOSIS — I2510 Atherosclerotic heart disease of native coronary artery without angina pectoris: Secondary | ICD-10-CM | POA: Diagnosis not present

## 2024-05-19 ENCOUNTER — Ambulatory Visit: Attending: Internal Medicine | Admitting: Internal Medicine

## 2024-05-19 ENCOUNTER — Ambulatory Visit (INDEPENDENT_AMBULATORY_CARE_PROVIDER_SITE_OTHER)

## 2024-05-19 ENCOUNTER — Other Ambulatory Visit (HOSPITAL_COMMUNITY): Payer: Self-pay

## 2024-05-19 ENCOUNTER — Telehealth: Payer: Self-pay

## 2024-05-19 VITALS — BP 112/72 | HR 54 | Wt 220.0 lb

## 2024-05-19 DIAGNOSIS — I480 Paroxysmal atrial fibrillation: Secondary | ICD-10-CM

## 2024-05-19 DIAGNOSIS — I2510 Atherosclerotic heart disease of native coronary artery without angina pectoris: Secondary | ICD-10-CM | POA: Diagnosis not present

## 2024-05-19 DIAGNOSIS — E782 Mixed hyperlipidemia: Secondary | ICD-10-CM

## 2024-05-19 NOTE — Telephone Encounter (Signed)
-----   Message from Stanly DELENA Leavens sent at 05/19/2024 11:46 AM EST ----- Ralph Blackwell, would you be able to price check [id number]? Thanks, MAC

## 2024-05-19 NOTE — Telephone Encounter (Signed)
 Pharmacy Patient Advocate Encounter   Received notification from Physician's Office that prior authorization for REPATHA is required/requested.   Insurance verification completed.   The patient is insured through [facility] ADVANTAGE/RX ADVANCE.   Per test claim: PA required; PA submitted to above mentioned insurance via Latent Key/confirmation #/EOC [id number] Status is pending

## 2024-05-19 NOTE — Patient Instructions (Signed)
 Medication Instructions:  Your physician has recommended you make the following change in your medication:  1) STOP taking metoprolol    *If you need a refill on your cardiac medications before your next appointment, please call your pharmacy*  Testing/Procedures: Event Monitor - in one week Your physician has recommended that you wear an event monitor. Event monitors are medical devices that record the heart's electrical activity. Doctors most often us  these monitors to diagnose arrhythmias. Arrhythmias are problems with the speed or rhythm of the heartbeat. The monitor is a small, portable device. You can wear one while you do your normal daily activities. This is usually used to diagnose what is causing palpitations/syncope (passing out).   Follow-Up: At [facility], you and your health needs are our priority.  As part of our continuing mission to provide you with exceptional heart care, our providers are all part of one team.  This team includes your primary Cardiologist (physician) and Advanced Practice Providers or APPs (Physician Assistants and Nurse Practitioners) who all work together to provide you with the care you need, when you need it.  Your next appointment:   March 2026   Provider:   Stanly DELENA Leavens, MD      GEOFFRY HEWS- Long Term Monitor Instructions  Your physician has requested you wear a ZIO patch monitor for 7 days.  This is a single patch monitor. [employer] supplies one patch monitor per enrollment. Additional stickers are not available. Please do not apply patch if you will be having a Nuclear Stress Test,  Echocardiogram, Cardiac CT, MRI, or Chest Xray during the period you would be wearing the  monitor. The patch cannot be worn during these tests. You cannot remove and re-apply the  ZIO XT patch monitor.  Your ZIO patch monitor will be mailed 3 day USPS to your address on file. It may take 3-5 days  to receive your monitor after you have been enrolled.   Once you have received your monitor, please review the enclosed instructions. Your monitor  has already been registered assigning a specific monitor serial # to you.  Billing and Patient Assistance Program Information  We have supplied [employer] with any of your insurance information on file for billing purposes. [employer] offers a sliding scale Patient Assistance Program for patients that do not have  insurance, or whose insurance does not completely cover the cost of the ZIO monitor.  You must apply for the Patient Assistance Program to qualify for this discounted rate.  To apply, please call [employer] at [phone], select option 4, select option 2, ask to apply for  Patient Assistance Program. Meredeth will ask your household income, and how many people  are in your household. They will quote your out-of-pocket cost based on that information.  [employer] will also be able to set up a 79-month, interest-free payment plan if needed.  Applying the monitor   Shave hair from upper left chest.  Hold abrader disc by orange tab. Rub abrader in 40 strokes over the upper left chest as  indicated in your monitor instructions.  Clean area with 4 enclosed alcohol pads. Let dry.  Apply patch as indicated in monitor instructions. Patch will be placed under collarbone on left  side of chest with arrow pointing upward.  Rub patch adhesive wings for 2 minutes. Remove white label marked 1. Remove the white  label marked 2. Rub patch adhesive wings for 2 additional minutes.  While looking in a mirror, press and release button in  center of patch. A small green light will  flash 3-4 times. This will be your only indicator that the monitor has been turned on.  Do not shower for the first 24 hours. You may shower after the first 24 hours.  Press the button if you feel a symptom. You will hear a small click. Record Date, Time and  Symptom in the Patient Logbook.  When you are ready to remove the patch, follow  instructions on the last 2 pages of Patient  Logbook. Stick patch monitor onto the last page of Patient Logbook.  Place Patient Logbook in the blue and white box. Use locking tab on box and tape box closed  securely. The blue and white box has prepaid postage on it. Please place it in the mailbox as  soon as possible. Your physician should have your test results approximately 7 days after the  monitor has been mailed back to [facility].  Call [facility] Customer Care at [phone] if you have questions regarding  your ZIO XT patch monitor. Call them immediately if you see an orange light blinking on your  monitor.  If your monitor falls off in less than 4 days, contact our Monitor department at [phone].  If your monitor becomes loose or falls off after 4 days call [employer] at [phone] for  suggestions on securing your monitor

## 2024-05-19 NOTE — Progress Notes (Signed)
 Cardiology Office Note:    Date:  05/19/2024   ID:  Ralph Blackwell, DOB July 03, 1941, MRN [id number]  PCP:  Dwight Trula SQUIBB, MD   [facility] Providers Cardiologist:  Stanly DELENA Leavens, MD     Referring MD: Dwight Trula SQUIBB, MD   CC: Non obstructive CAD Care  History of Present Illness:    Ralph Blackwell is a 83 y.o. male with a hx of HTN, HLD, CAD non obstructive in 2018 without prior intervention was admitted 10/11/22.  Has hx of PE; he was found to have large saddle PE.  He is s/p EKOS complicated by pseudoaneurysm. 2024: Transitioned to see me after cath and compartment syndrome in the setting of submassive PE  Discussed the use of AI scribe software for clinical note transcription with the patient, who gave verbal consent to proceed.  Ralph Blackwell is an 83 year old male with atrial fibrillation and coronary artery disease who presents for cardiovascular evaluation.  He has a history of atrial fibrillation and coronary artery disease. No symptomatic atrial fibrillation, chest pain, or breathing issues are reported.  He has a history of pulmonary embolism and vascular issues, but there are no recent symptoms related to these conditions.  He experiences mild cognitive deficits, acknowledging challenges with memory and cognition, which have been a topic of discussion in past visits.  Overall doing ok with this.  He had a bout of diverticulitis about a month ago, which was evaluated with a CT scan. He does not report any current symptoms related to this condition.  He is very active, engaging in activities such as walking, gardening, and other physical tasks, although his wife mentions that he is not walking as much as before. She also notes that his breathing can be heavier during activities (seeing them both today)  Past Medical History:  Diagnosis Date   Coronary artery disease    GERD (gastroesophageal reflux disease)    Gout    History of hiatal hernia     Hypertension    Pulmonary embolism (HCC) 2008 after knee surgey   Renal insufficiency    hx renal failure 2008 after blood clots   Sleep apnea     Past Surgical History:  Procedure Laterality Date   APPLICATION OF WOUND VAC Right 10/20/2022   Procedure: WOUND VAC EXCHANGE;  Surgeon: Gretta Lonni PARAS, MD;  Location: [facility] OR;  Service: Vascular;  Laterality: Right;   arthroscopic knee surgery  2004   BACK SURGERY  nov 1979 and 1998   lower   COLONOSCOPY WITH PROPOFOL  N/A 10/20/2016   Procedure: COLONOSCOPY WITH PROPOFOL ;  Surgeon: Gladis MARLA Louder, MD;  Location: [facility];  Service: Endoscopy;  Laterality: N/A;   FASCIOTOMY Right 10/15/2022   Procedure: RIGHT FOREARM FASCIOTOMY, RADIAL ARTERY REPAIR, AND RADIAL ARTERY THROMBECOTOMY;  Surgeon: Romona Harari, MD;  Location: [facility] OR;  Service: Orthopedics;  Laterality: Right;   I & D EXTREMITY Right 10/17/2022   Procedure: IRRIGATION AND DEBRIDEMENT RIGHT ARM & WOUND VAC CHANGE;  Surgeon: Gretta Lonni PARAS, MD;  Location: [facility] OR;  Service: Vascular;  Laterality: Right;   I & D EXTREMITY Right 10/22/2022   Procedure: REPEAT IRRIGATION AND DEBRIDEMENT RIGHT ARM WITH WOUND VAC CHANGE WITH MYRIAD PLACEMENT;  Surgeon: Gretta Lonni PARAS, MD;  Location: [facility] OR;  Service: Vascular;  Laterality: Right;   INCISION AND DRAINAGE OF WOUND Right 10/20/2022   Procedure: IRRIGATION AND DEBRIDEMENT SKIN, SUBCUTANEOUS TISSUE AND MUSCLE WITH MYRIAD MORSELS AND DRAIN  PLACEMENT;  Surgeon: Gretta Lonni PARAS, MD;  Location: [facility] OR;  Service: Vascular;  Laterality: Right;   IR ANGIOGRAM PULMONARY BILATERAL SELECTIVE  10/14/2022   IR ANGIOGRAM SELECTIVE EACH ADDITIONAL VESSEL  10/14/2022   IR ANGIOGRAM SELECTIVE EACH ADDITIONAL VESSEL  10/14/2022   IR INFUSION THROMBOL ARTERIAL INITIAL (MS)  10/14/2022   IR INFUSION THROMBOL ARTERIAL INITIAL (MS)  10/14/2022   IR THROMB F/U EVAL ART/VEN FINAL DAY (MS)  10/15/2022   IR US  GUIDE VASC ACCESS RIGHT  10/14/2022   JOINT REPLACEMENT  Left 2007   knee    LEFT HEART CATH AND CORONARY ANGIOGRAPHY N/A 06/30/2017   Procedure: LEFT HEART CATH AND CORONARY ANGIOGRAPHY;  Surgeon: Elmira Newman PARAS, MD;  Location: [facility] IN[facility] CV LAB;  Service: Cardiovascular;  Laterality: N/A;   LEFT HEART CATH AND CORONARY ANGIOGRAPHY N/A 10/13/2022   Procedure: LEFT HEART CATH AND CORONARY ANGIOGRAPHY;  Surgeon: Elmira Newman PARAS, MD;  Location: [facility] INV[facility] CV LAB;  Service: Cardiovascular;  Laterality: N/A;   left wrist plating   2005   titanium plate   right total knee replacement Right 2009    Current Medications: Current Meds  Medication Sig   albuterol (VENTOLIN HFA) 108 (90 Base) MCG/ACT inhaler as needed for wheezing or shortness of breath.   allopurinol  (ZYLOPRIM ) 300 MG tablet Take 150 mg by mouth daily.    apixaban  (ELIQUIS ) 5 MG TABS tablet Take 1 tablet (5 mg total) by mouth 2 (two) times daily.   ascorbic acid (VITAMIN C) 500 MG tablet Take 500 mg by mouth daily.   Cholecalciferol (VITAMIN D3) 5000 units CAPS Take 2,000 Units by mouth daily with supper.    ezetimibe  (ZETIA ) 10 MG tablet Take 1 tablet (10 mg total) by mouth daily.   finasteride  (PROSCAR ) 5 MG tablet Take 5 mg by mouth at bedtime.    fluticasone (FLONASE) 50 MCG/ACT nasal spray Place 1 spray into both nostrils every evening.   magnesium gluconate (MAGONATE) 500 MG tablet Take 500 mg by mouth daily.   omeprazole  (PRILOSEC) 20 MG capsule Take 20 mg by mouth daily.   rosuvastatin  (CRESTOR ) 5 MG tablet Take 5 mg by mouth daily.   zinc gluconate 50 MG tablet Take 50 mg by mouth daily.   [DISCONTINUED] metoprolol  tartrate (LOPRESSOR ) 25 MG tablet Take 0.5 tablets (12.5 mg total) by mouth 2 (two) times daily.   [DISCONTINUED] omeprazole  (PRILOSEC) 40 MG capsule TAKE 1 CAPSULE EVERY DAY     Allergies:   Patient has no known allergies.   Social History   Socioeconomic History   Marital status: Married    Spouse name: Not on file   Number of children: 2    Years of education: Not on file   Highest education level: Not on file  Occupational History   Not on file  Tobacco Use   Smoking status: Never   Smokeless tobacco: Never  Vaping Use   Vaping status: Never Used  Substance and Sexual Activity   Alcohol use: No   Drug use: No   Sexual activity: Not on file  Other Topics Concern   Not on file  Social History Narrative   Not on file   Social Drivers of Health   Financial Resource Strain: Not on file  Food Insecurity: No Food Insecurity (10/13/2022)   Hunger Vital Sign    Worried About Running Out of Food in the Last Year: Never true    Ran Out of Food in the Last Year: Never  true  Transportation Needs: No Transportation Needs (10/13/2022)   PRAPARE - Administrator, Civil Service (Medical): No    Lack of Transportation (Non-Medical): No  Physical Activity: Not on file  Stress: Not on file  Social Connections: Not on file    Social: I see his wife, Nena  Family History: The patient's family history includes Heart attack in his father; Heart disease in his father and mother; Hypertension in his father and mother.  ROS:   Please see the history of present illness.     EKGs/Labs/Other Studies Reviewed:    The following studies were reviewed today:  Cardiac Studies & Procedures   ______________________________________________________________________________________________ CARDIAC CATHETERIZATION  CARDIAC CATHETERIZATION 10/13/2022  Conclusion Images from the original result were not included. LM: Normal LAD: Minimal luminal irregularities Lcx: Large, dominant. Normal RCA: Small, nondominant.  Normal  LVEDP normal  Resting Pd/Pa: 0.93 FFR: 0.88 CFR: 4.5 IMR: 27 (only marginally abnormal)  No angiographically evident significant coronary artery disease Nearly normal microvascular indicis  Above findings do not explain the severity of patient's chest pain shortness of breath.  I personally reviewed  and compared echocardiograms.  Echocardiogram performed outpatient last week showed RVSP of 28 mmHg.  RVSP was noted to be 69 mmHg on inpatient echocardiogram yesterday.  Patient reportedly has history of PEs in the past.  Given significant only different RVSP, patient's profound symptoms not explained by coronary or cardiac etiology, I strongly suspect acute pulmonary cause such as pulmonary embolism.  I personally discussed this with patient and patient's wife, as well as hospitalist Dr. Ofilia.  We will proceed with CT angiogram chest, with additional hydration.  If CT angiogram does not reveal acute PE, I will perform right heart catheterization tomorrow.   Newman JINNY Lawrence, MD Pager: [phone] Office: [phone]  Findings Coronary Findings Diagnostic  Dominance: Left  Left Main Vessel is normal in caliber. Vessel is angiographically normal.  Left Anterior Descending Vessel is normal in caliber. The vessel exhibits minimal luminal irregularities.  Left Circumflex Vessel is normal in caliber. Vessel is angiographically normal.  Right Coronary Artery Vessel is small. Vessel is angiographically normal.  Intervention  No interventions have been documented.   CARDIAC CATHETERIZATION  CARDIAC CATHETERIZATION 06/30/2017  Conclusion  The left ventricular ejection fraction is 55-65% by visual estimate.  Mild nonobstructive coronary artery disease (Prox LAD 30%) with sluggish flow in all vessels. Normal filling pressures.  Recommendations: Continue medical management.  Newman JINNY Lawrence, MD [facility]. [place] Pager: [phone] Office: [phone] If no answer Cell [phone]  Findings Coronary Findings Diagnostic  Dominance: Left  Left Anterior Descending Ost LAD to Prox LAD lesion is 30% stenosed.  Intervention  No interventions have been documented.     ECHOCARDIOGRAM  ECHOCARDIOGRAM COMPLETE 01/06/2023  Narrative ECHOCARDIOGRAM  REPORT    Patient Name:   Ralph Blackwell Date of Exam: 01/06/2023 Medical Rec #:  [id number]       Height:       69.0 in Accession #:    [id number]      Weight:       215.1 lb Date of Birth:  1940/12/21       BSA:          2.131 m Patient Age:    82 years        BP:           149/86 mmHg Patient Gender: M  HR:           72 bpm. Exam Location:  [address]  Procedure: 2D Echo, 3D Echo, Cardiac Doppler, Color Doppler and Strain Analysis  Indications:    I35.1 AI ; I26.02 Pulmonary embolus  History:        Patient has prior history of Echocardiogram examinations. CAD, Pulmonary embolus, Arrythmias:Atrial Fibrillation; Risk Factors:Hypertension and Dyslipidemia. Previous echo revealed LVEF 70% with PAP 69.2 mmHg, RV normal size and function and mild AR.  Sonographer:    Nolon Berg [facility], RDCS Referring Phys: [id number] [facility] Onyinyechi Huante  IMPRESSIONS   1. Left ventricular ejection fraction, by estimation, is 60 to 65%. The left ventricle has normal function. The left ventricle has no regional wall motion abnormalities. Left ventricular diastolic parameters are consistent with Grade I diastolic dysfunction (impaired relaxation). Elevated left ventricular end-diastolic pressure. The average left ventricular global longitudinal strain is -20.2 %. The global longitudinal strain is normal. 2. Compared with the echo 09/2022, RVSP has reduced from 69.2 mmHg to 40.5 mmHg. Right ventricular systolic function is normal. The right ventricular size is mildly enlarged. There is mildly elevated pulmonary artery systolic pressure. 3. The mitral valve is normal in structure. Trivial mitral valve regurgitation. No evidence of mitral stenosis. 4. The aortic valve is tricuspid. Aortic valve regurgitation is mild. No aortic stenosis is present. 5. The inferior vena cava is dilated in size with >50% respiratory variability, suggesting right atrial pressure of 8 mmHg.  FINDINGS Left  Ventricle: Left ventricular ejection fraction, by estimation, is 60 to 65%. The left ventricle has normal function. The left ventricle has no regional wall motion abnormalities. The average left ventricular global longitudinal strain is -20.2 %. The global longitudinal strain is normal. The left ventricular internal cavity size was normal in size. There is no left ventricular hypertrophy. Left ventricular diastolic parameters are consistent with Grade I diastolic dysfunction (impaired relaxation). Elevated left ventricular end-diastolic pressure.  Right Ventricle: Compared with the echo 09/2022, RVSP has reduced from 69.2 mmHg to 40.5 mmHg. The right ventricular size is mildly enlarged. No increase in right ventricular wall thickness. Right ventricular systolic function is normal. There is mildly elevated pulmonary artery systolic pressure. The tricuspid regurgitant velocity is 2.85 m/s, and with an assumed right atrial pressure of 8 mmHg, the estimated right ventricular systolic pressure is 40.5 mmHg.  Left Atrium: Left atrial size was normal in size.  Right Atrium: Right atrial size was normal in size.  Pericardium: There is no evidence of pericardial effusion.  Mitral Valve: The mitral valve is normal in structure. Trivial mitral valve regurgitation. No evidence of mitral valve stenosis.  Tricuspid Valve: The tricuspid valve is normal in structure. Tricuspid valve regurgitation is trivial. No evidence of tricuspid stenosis.  Aortic Valve: The aortic valve is tricuspid. Aortic valve regurgitation is mild. No aortic stenosis is present.  Pulmonic Valve: The pulmonic valve was normal in structure. Pulmonic valve regurgitation is mild. No evidence of pulmonic stenosis.  Aorta: The aortic root is normal in size and structure.  Venous: The inferior vena cava is dilated in size with greater than 50% respiratory variability, suggesting right atrial pressure of 8 mmHg.  IAS/Shunts: No atrial level  shunt detected by color flow Doppler.   LEFT VENTRICLE PLAX 2D LVIDd:         4.90 cm   Diastology LVIDs:         3.50 cm   LV e' medial:    7.98 cm/s LV PW:  0.90 cm   LV E/e' medial:  10.5 LV IVS:        0.90 cm   LV e' lateral:   12.63 cm/s LVOT diam:     2.40 cm   LV E/e' lateral: 6.6 LV SV:         118 LV SV Index:   56        2D Longitudinal Strain LVOT Area:     4.52 cm  2D Strain GLS (A2C):   -19.0 % 2D Strain GLS (A3C):   -23.0 % 2D Strain GLS (A4C):   -18.6 % 2D Strain GLS Avg:     -20.2 %  3D Volume EF: 3D EF:        65 % LV EDV:       152 ml LV ESV:       53 ml LV SV:        99 ml  RIGHT VENTRICLE             IVC RV Basal diam:  3.50 cm     IVC diam: 2.10 cm RV Mid diam:    3.00 cm RV S prime:     16.02 cm/s TAPSE (M-mode): 2.6 cm RVSP:           35.5 mmHg  LEFT ATRIUM             Index        RIGHT ATRIUM           Index LA diam:        4.30 cm 2.02 cm/m   RA Pressure: 3.00 mmHg LA Vol (A2C):   33.5 ml 15.72 ml/m  RA Area:     12.10 cm LA Vol (A4C):   29.5 ml 13.84 ml/m  RA Volume:   27.50 ml  12.91 ml/m LA Biplane Vol: 31.4 ml 14.74 ml/m AORTIC VALVE LVOT Vmax:   123.33 cm/s LVOT Vmean:  78.833 cm/s LVOT VTI:    0.262 m  AORTA Ao Root diam: 3.20 cm Ao Asc diam:  3.00 cm  MITRAL VALVE               TRICUSPID VALVE MV Area (PHT): 3.84 cm    TR Peak grad:   32.5 mmHg MV Decel Time: 197 msec    TR Vmax:        285.00 cm/s MV E velocity: 83.70 cm/s  Estimated RAP:  3.00 mmHg MV A velocity: 79.50 cm/s  RVSP:           35.5 mmHg MV E/A ratio:  1.05 SHUNTS Systemic VTI:  0.26 m Systemic Diam: 2.40 cm  Annabella Scarce MD Electronically signed by Annabella Scarce MD Signature Date/Time: 01/06/2023/12:36:25 PM    Final          ______________________________________________________________________________________________        Recent Labs: 05/01/2024: ALT 17; BUN 36; Creatinine, Ser 1.20; Hemoglobin 14.3; Platelets 141;  Potassium 4.5; Sodium 141  Recent Lipid Panel    Component Value Date/Time   CHOL 217 (H) 01/21/2024 0904   TRIG 52 01/21/2024 0904   HDL 60 01/21/2024 0904   CHOLHDL 3.6 01/21/2024 0904   LDLCALC 148 (H) 01/21/2024 0904      Physical Exam:    VS:  BP 112/72   Pulse (!) 54   Wt 220 lb (99.8 kg)   SpO2 95%   BMI 32.49 kg/m     Wt Readings from Last 3 Encounters:  05/19/24 220 lb (99.8 kg)  05/01/24 218 lb 0.6 oz (98.9 kg)  05/26/23 218 lb (98.9 kg)    GEN:  Well nourished, well developed in no acute distress HEENT: Normal NECK: No JVD CARDIAC: iRRR, no murmurs, rubs, gallops RESPIRATORY:  Clear to auscultation without rales, wheezing or rhonchi  ABDOMEN: Soft, non-tender, non-distended MUSCULOSKELETAL:  No edema NEUROLOGIC:  Alert and oriented x 3 PSYCHIATRIC:  Normal affect   ASSESSMENT/PLAN:     Paroxysmal atrial fibrillation Mobitz type I (Wenckebach) heart block No symptomatic episodes or rapid ventricular response. Apple Watch has not triggered any alerts, indicating stability. - Discontinued metoprolol  due to lack of significant benefit and potential contribution to heart conduction slowing. - Ordered heart monitor for one week to assess heart conduction and rule out medication-induced issues (one week after BB stop)  Atherosclerotic heart disease of native coronary artery Mixed hyperlipidemia - asymptomatic on current therapy - discussed dietary mgmt; Cholesterol levels were elevated in July. Previous statin therapy was not tolerated due to muscle pain. Consideration of newer medications like Repatha or Praluent if cost-effective (price check started)  Pulmonary Embolism (PE) History of submassive PE in 2024 with subsequent improvement in RV function. Currently on Eliquis  for anticoagulation.  Atrial Fibrillation (Afib) - CHASDVASC NA for recurrent PE - paroxsymal with no re-occurrence; BB stopped, monitor as above  Mild Aortic Regurgitation - echo in  2027   Longitudinal care: The evaluation and management services provided today reflect the complexity inherent in caring for this patient, including the ongoing longitudinal relationship and management of multiple chronic conditions and/or the need for care coordination. The visit required a comprehensive assessment and management plan tailored to the patient's unique needs Time was spent addressing not only the acute concerns but also the broader context of the patient's health, including preventive care, chronic disease management, and care coordination as appropriate.  Complex longitudinal is necessary for conditions including: CAD management with prior vascular complications;       Stanly Leavens, MD FASE [facility] Cardiologist [facility]  [address] [place], [place] [address] [phone]  11:46 AM

## 2024-05-19 NOTE — Telephone Encounter (Signed)
 Hi Dr. Santo,    Per test claim it needs a PA. Would you like for me to proceed with PA submission?    I can get you a price once the PA determination comes back.

## 2024-05-19 NOTE — Telephone Encounter (Signed)
 Pharmacy Patient Advocate Encounter  Received notification from [facility] [employer] that Prior Authorization for REPATHA has been APPROVED from 05/19/24 to 11/15/24. Ran test claim, Copay is $0. This test claim was processed through [facility]- copay amounts may vary at other pharmacies due to pharmacy/plan contracts, or as the patient moves through the different stages of their insurance plan.    $0 COPAY

## 2024-05-19 NOTE — Progress Notes (Unsigned)
 Enrolled patient for a 7 day Zio XT monitor to patients home

## 2024-05-20 ENCOUNTER — Telehealth: Payer: Self-pay | Admitting: Internal Medicine

## 2024-05-20 NOTE — Telephone Encounter (Signed)
 Patient scheduled with PharmD for 11/13.

## 2024-05-20 NOTE — Telephone Encounter (Signed)
 Spoke with pt and explained that per Dr. Milan note, he was only to stop the Metoprolol  from yesterdays visit. Pt verbalized understanding and said he just wanted to double-check. No further questions at this time.

## 2024-05-20 NOTE — Telephone Encounter (Signed)
 Pt c/o medication issue:  1. Name of Medication:   rosuvastatin  (CRESTOR ) 5 MG tablet    2. How are you currently taking this medication (dosage and times per day)?  Take 5 mg by mouth daily.      3. Are you having a reaction (difficulty breathing--STAT)? No  4. What is your medication issue? Pt is requesting a callback regarding him wanting to know if she should be stopping this medication as well since he was taken off of Metoprolol . Please advise

## 2024-05-25 NOTE — Progress Notes (Unsigned)
 Patient ID: LAWRANCE WIEDEMANN                 DOB: 1941/07/02                    MRN: [id number]      HPI: Ralph Blackwell is a 83 y.o. male patient referred to lipid clinic by  Dr. Santo. PMH is significant for HNT, HLD, CAD non obstructive in 2018 without prior intervention was admitted 10/11/22. Has hx of PE; he was found to have large saddle PE. He is s/p EKOS complicated by pseudoaneurysm.   LDL on 7/10 was 148 at baseline.  After this patient was started on Zetia  10 mg daily.  In October, PCP started Rosuvastatin  5 mg.   LDL on 10/16 was 52 on Rosuvastatin  5mg  and Zetia  10mg    Patient PA sent for Repatha after last visit and was scheduled with PharmD for counseling on injectables.   Current Medications: ezetimibe  10 mg daily, rosuvastatin  5 mg daily,  Intolerances: rosuvastatin  40, 20, and 10 mg (myalgias) Risk Factors: HTN, HLD, CAD LDL-C goal: <55   Diet:   Exercise:   Family History:  Mother: Heart disease, HTN Father: MI, heart disease, HTN   Social History:   Labs: Lipid Panel     Component Value Date/Time   CHOL 217 (H) 01/21/2024 0904   TRIG 52 01/21/2024 0904   HDL 60 01/21/2024 0904   CHOLHDL 3.6 01/21/2024 0904   LDLCALC 148 (H) 01/21/2024 0904   LABVLDL 9 01/21/2024 0904    Past Medical History:  Diagnosis Date   Coronary artery disease    GERD (gastroesophageal reflux disease)    Gout    History of hiatal hernia    Hypertension    Pulmonary embolism (HCC) 2008 after knee surgey   Renal insufficiency    hx renal failure 2008 after blood clots   Sleep apnea     Current Outpatient Medications on File Prior to Visit  Medication Sig Dispense Refill   albuterol (VENTOLIN HFA) 108 (90 Base) MCG/ACT inhaler as needed for wheezing or shortness of breath.     allopurinol  (ZYLOPRIM ) 300 MG tablet Take 150 mg by mouth daily.      apixaban  (ELIQUIS ) 5 MG TABS tablet Take 1 tablet (5 mg total) by mouth 2 (two) times daily. 180 tablet 1   ascorbic  acid (VITAMIN C) 500 MG tablet Take 500 mg by mouth daily.     Cholecalciferol (VITAMIN D3) 5000 units CAPS Take 2,000 Units by mouth daily with supper.      ezetimibe  (ZETIA ) 10 MG tablet Take 1 tablet (10 mg total) by mouth daily. 90 tablet 3   finasteride  (PROSCAR ) 5 MG tablet Take 5 mg by mouth at bedtime.      fluticasone (FLONASE) 50 MCG/ACT nasal spray Place 1 spray into both nostrils every evening.     magnesium gluconate (MAGONATE) 500 MG tablet Take 500 mg by mouth daily.     omeprazole  (PRILOSEC) 20 MG capsule Take 20 mg by mouth daily.     rosuvastatin  (CRESTOR ) 5 MG tablet Take 5 mg by mouth daily.     zinc gluconate 50 MG tablet Take 50 mg by mouth daily.     No current facility-administered medications on file prior to visit.    No Known Allergies  Assessment/Plan:  1. Hyperlipidemia -  No problem-specific Assessment & Plan notes found for this encounter.    Thank you, Lum Ricks, PharmD  Candidate  [place], [employer]    Eleanor JONETTA Crews, Pharm.JONETTA SARAN, CPP [facility] A Division of [facility] [address] [place], [place] [address]  Phone: [phone]; Fax: [phone]

## 2024-05-26 ENCOUNTER — Telehealth: Payer: Self-pay

## 2024-05-26 ENCOUNTER — Ambulatory Visit: Admitting: Pharmacist

## 2024-05-26 NOTE — Telephone Encounter (Signed)
 Spoke with patient on the telephone regarding labs and medication changes.. The patient confirmed that he was having no issues while on Rosuvastain 5 mg and Zetia  10mg .   Labs with PCP on 10/16 showed that his LDL was 52, within goal for this patient.   Because his LDL is within goal at this time, the appointment was cancelled.    Lum Ricks, PharmD Candidate  [facility] [employer]

## 2024-06-03 ENCOUNTER — Other Ambulatory Visit: Payer: Self-pay | Admitting: Internal Medicine

## 2024-06-09 DIAGNOSIS — I2510 Atherosclerotic heart disease of native coronary artery without angina pectoris: Secondary | ICD-10-CM | POA: Diagnosis not present

## 2024-06-09 DIAGNOSIS — I480 Paroxysmal atrial fibrillation: Secondary | ICD-10-CM | POA: Diagnosis not present

## 2024-06-14 DIAGNOSIS — I2510 Atherosclerotic heart disease of native coronary artery without angina pectoris: Secondary | ICD-10-CM

## 2024-06-14 DIAGNOSIS — E782 Mixed hyperlipidemia: Secondary | ICD-10-CM | POA: Diagnosis not present

## 2024-06-14 DIAGNOSIS — I480 Paroxysmal atrial fibrillation: Secondary | ICD-10-CM

## 2024-06-15 ENCOUNTER — Ambulatory Visit: Payer: Self-pay | Admitting: Internal Medicine

## 2024-09-12 ENCOUNTER — Ambulatory Visit: Admitting: Internal Medicine
